# Patient Record
Sex: Female | Born: 1955 | Race: White | Hispanic: No | Marital: Single | Smoking: Former smoker
Health system: Southern US, Community
[De-identification: ages and names within clinical notes are randomized; demographics above are authoritative.]

## PROBLEM LIST (undated history)

## (undated) DIAGNOSIS — E119 Type 2 diabetes mellitus without complications: Secondary | ICD-10-CM

## (undated) DIAGNOSIS — H269 Unspecified cataract: Secondary | ICD-10-CM

## (undated) DIAGNOSIS — J45909 Unspecified asthma, uncomplicated: Secondary | ICD-10-CM

## (undated) DIAGNOSIS — E785 Hyperlipidemia, unspecified: Secondary | ICD-10-CM

## (undated) DIAGNOSIS — T8859XA Other complications of anesthesia, initial encounter: Secondary | ICD-10-CM

## (undated) DIAGNOSIS — R251 Tremor, unspecified: Secondary | ICD-10-CM

## (undated) DIAGNOSIS — Z8719 Personal history of other diseases of the digestive system: Secondary | ICD-10-CM

## (undated) DIAGNOSIS — K439 Ventral hernia without obstruction or gangrene: Secondary | ICD-10-CM

## (undated) DIAGNOSIS — M51369 Other intervertebral disc degeneration, lumbar region without mention of lumbar back pain or lower extremity pain: Secondary | ICD-10-CM

## (undated) DIAGNOSIS — R0600 Dyspnea, unspecified: Secondary | ICD-10-CM

## (undated) DIAGNOSIS — I10 Essential (primary) hypertension: Secondary | ICD-10-CM

## (undated) DIAGNOSIS — I2510 Atherosclerotic heart disease of native coronary artery without angina pectoris: Secondary | ICD-10-CM

## (undated) DIAGNOSIS — M5136 Other intervertebral disc degeneration, lumbar region: Secondary | ICD-10-CM

## (undated) DIAGNOSIS — M1990 Unspecified osteoarthritis, unspecified site: Secondary | ICD-10-CM

## (undated) DIAGNOSIS — R0902 Hypoxemia: Secondary | ICD-10-CM

## (undated) DIAGNOSIS — G709 Myoneural disorder, unspecified: Secondary | ICD-10-CM

## (undated) DIAGNOSIS — J449 Chronic obstructive pulmonary disease, unspecified: Secondary | ICD-10-CM

## (undated) DIAGNOSIS — E039 Hypothyroidism, unspecified: Secondary | ICD-10-CM

## (undated) HISTORY — PX: CHOLECYSTECTOMY: SHX55

## (undated) HISTORY — PX: TONSILLECTOMY: SUR1361

## (undated) HISTORY — PX: OTHER SURGICAL HISTORY: SHX169

## (undated) HISTORY — PX: ABDOMINAL HYSTERECTOMY: SHX81

## (undated) HISTORY — PX: HIATAL HERNIA REPAIR: SHX195

## (undated) HISTORY — DX: Ventral hernia without obstruction or gangrene: K43.9

## (undated) HISTORY — DX: Myoneural disorder, unspecified: G70.9

## (undated) HISTORY — PX: THYROIDECTOMY: SHX17

## (undated) HISTORY — PX: TUMOR REMOVAL: SHX12

---

## 1998-03-11 HISTORY — PX: OTHER SURGICAL HISTORY: SHX169

## 2000-11-27 ENCOUNTER — Emergency Department (HOSPITAL_COMMUNITY): Admission: EM | Admit: 2000-11-27 | Discharge: 2000-11-28 | Payer: Self-pay | Admitting: Emergency Medicine

## 2000-11-28 ENCOUNTER — Encounter: Payer: Self-pay | Admitting: Emergency Medicine

## 2004-10-16 ENCOUNTER — Other Ambulatory Visit: Payer: Self-pay

## 2004-10-16 ENCOUNTER — Emergency Department: Payer: Self-pay | Admitting: Emergency Medicine

## 2004-12-05 ENCOUNTER — Ambulatory Visit: Payer: Self-pay

## 2004-12-28 ENCOUNTER — Other Ambulatory Visit: Payer: Self-pay

## 2004-12-28 ENCOUNTER — Inpatient Hospital Stay: Payer: Self-pay | Admitting: Otolaryngology

## 2006-04-14 ENCOUNTER — Ambulatory Visit: Payer: Self-pay | Admitting: Family Medicine

## 2006-04-15 ENCOUNTER — Ambulatory Visit: Payer: Self-pay | Admitting: Family Medicine

## 2007-05-14 ENCOUNTER — Ambulatory Visit: Payer: Self-pay | Admitting: General Surgery

## 2007-05-25 ENCOUNTER — Other Ambulatory Visit: Payer: Self-pay

## 2007-05-25 ENCOUNTER — Ambulatory Visit: Payer: Self-pay | Admitting: General Surgery

## 2007-06-17 ENCOUNTER — Ambulatory Visit: Payer: Self-pay | Admitting: Cardiology

## 2007-07-06 ENCOUNTER — Ambulatory Visit: Payer: Self-pay | Admitting: General Surgery

## 2011-10-01 ENCOUNTER — Ambulatory Visit: Payer: Self-pay | Admitting: Family Medicine

## 2011-10-14 ENCOUNTER — Ambulatory Visit: Payer: Self-pay | Admitting: Family Medicine

## 2013-11-16 ENCOUNTER — Emergency Department: Payer: Self-pay | Admitting: Internal Medicine

## 2013-11-16 LAB — URINALYSIS, COMPLETE
Bacteria: NONE SEEN
Bilirubin,UR: NEGATIVE
Blood: NEGATIVE
Glucose,UR: 150 mg/dL (ref 0–75)
Ketone: NEGATIVE
Leukocyte Esterase: NEGATIVE
Nitrite: NEGATIVE
Ph: 6 (ref 4.5–8.0)
Protein: NEGATIVE
RBC,UR: 2 /HPF (ref 0–5)
Specific Gravity: 1.014 (ref 1.003–1.030)
Squamous Epithelial: 1
WBC UR: 1 /HPF (ref 0–5)

## 2013-11-16 LAB — COMPREHENSIVE METABOLIC PANEL
Albumin: 3.8 g/dL (ref 3.4–5.0)
Alkaline Phosphatase: 129 U/L — ABNORMAL HIGH
Anion Gap: 9 (ref 7–16)
BUN: 10 mg/dL (ref 7–18)
Bilirubin,Total: 0.4 mg/dL (ref 0.2–1.0)
Calcium, Total: 9.1 mg/dL (ref 8.5–10.1)
Chloride: 97 mmol/L — ABNORMAL LOW (ref 98–107)
Co2: 27 mmol/L (ref 21–32)
Creatinine: 1.04 mg/dL (ref 0.60–1.30)
EGFR (African American): >60
EGFR (Non-African Amer.): 59 — ABNORMAL LOW
Glucose: 262 mg/dL — ABNORMAL HIGH (ref 65–99)
Osmolality: 275 (ref 275–301)
Potassium: 3.5 mmol/L (ref 3.5–5.1)
SGOT(AST): 53 U/L — ABNORMAL HIGH (ref 15–37)
SGPT (ALT): 44 U/L
Sodium: 133 mmol/L — ABNORMAL LOW (ref 136–145)
Total Protein: 7.5 g/dL (ref 6.4–8.2)

## 2013-11-16 LAB — CBC
HCT: 42.2 % (ref 35.0–47.0)
HGB: 13.9 g/dL (ref 12.0–16.0)
MCH: 31.4 pg (ref 26.0–34.0)
MCHC: 32.8 g/dL (ref 32.0–36.0)
MCV: 96 fL (ref 80–100)
Platelet: 337 10*3/uL (ref 150–440)
RBC: 4.42 10*6/uL (ref 3.80–5.20)
RDW: 13.6 % (ref 11.5–14.5)
WBC: 13.6 10*3/uL — ABNORMAL HIGH (ref 3.6–11.0)

## 2013-11-16 LAB — TROPONIN I: Troponin-I: <0.02 ng/mL

## 2013-11-16 LAB — CK TOTAL AND CKMB (NOT AT ARMC)
CK, Total: 35 U/L
CK-MB: 1.6 ng/mL (ref 0.5–3.6)

## 2013-11-16 LAB — MAGNESIUM: Magnesium: 1 mg/dL — ABNORMAL LOW

## 2013-11-16 LAB — HEMOGLOBIN A1C: Hemoglobin A1C: 6.5 % — ABNORMAL HIGH (ref 4.2–6.3)

## 2013-12-02 ENCOUNTER — Ambulatory Visit: Payer: Self-pay | Admitting: Family Medicine

## 2014-06-02 ENCOUNTER — Ambulatory Visit
Admit: 2014-06-02 | Disposition: A | Discharge status: Home / Self Care | Payer: Self-pay | Attending: Oncology | Admitting: Oncology

## 2014-07-13 ENCOUNTER — Other Ambulatory Visit: Payer: Self-pay | Admitting: Family Medicine

## 2014-07-13 DIAGNOSIS — Z1231 Encounter for screening mammogram for malignant neoplasm of breast: Secondary | ICD-10-CM

## 2014-08-02 ENCOUNTER — Ambulatory Visit: Payer: Self-pay | Attending: Family Medicine

## 2014-08-18 ENCOUNTER — Telehealth: Payer: Self-pay

## 2014-08-18 NOTE — Telephone Encounter (Signed)
-----   Message from Otilio Jefferson sent at 08/15/2014  9:56 AM EDT ----- Regarding: colonoscopy From: Tia Masker Sent: 08/04/2014  triage

## 2014-08-18 NOTE — Telephone Encounter (Signed)
LVM for pt to return my call to schedule colonoscopy.  

## 2014-08-19 ENCOUNTER — Other Ambulatory Visit: Payer: Self-pay

## 2014-08-19 ENCOUNTER — Telehealth: Payer: Self-pay

## 2014-08-19 DIAGNOSIS — Z1211 Encounter for screening for malignant neoplasm of colon: Secondary | ICD-10-CM

## 2014-08-19 MED ORDER — NA SULFATE-K SULFATE-MG SULF 17.5-3.13-1.6 GM/177ML PO SOLN: 1.0000 | ORAL | Status: DC

## 2014-08-19 NOTE — Telephone Encounter (Signed)
Pt scheduled for colonoscopy at [facility] on 09-08-14. Instructs/rx mailed.

## 2014-08-19 NOTE — Progress Notes (Signed)
Gastroenterology Pre-Procedure Review  Request Date: 09-08-14  Requesting Physician: Dr.   PATIENT REVIEW QUESTIONS: The patient responded to the following health history questions as indicated:    1. Are you having any GI issues? no 2. Do you have a personal history of Polyps? no 3. Do you have a family history of Colon Cancer or Polyps? no 4. Diabetes Mellitus? yes (Type 2) 5. Joint replacements in the past 12 months?no 6. Major health problems in the past 3 months?yes (Parkinson's ) 7. Any artificial heart valves, MVP, or defibrillator?no    MEDICATIONS & ALLERGIES:    Patient reports the following regarding taking any anticoagulation/antiplatelet therapy:   Plavix, Coumadin, Eliquis, Xarelto, Lovenox, Pradaxa, Brilinta, or Effient? no Aspirin? no  Patient confirms/reports the following medications:  No current outpatient prescriptions on file.   No current facility-administered medications for this visit.    Patient confirms/reports the following allergies:  No Known Allergies  No orders of the defined types were placed in this encounter.    AUTHORIZATION INFORMATION Primary Insurance: 1D#: Group #:  Secondary Insurance: 1D#: Group #:  SCHEDULE INFORMATION: Date:  Time: Location: [place]

## 2014-08-19 NOTE — Telephone Encounter (Signed)
Colonoscopy scheduled at [facility] on 09-08-14. Instructs/rx mailed.

## 2014-09-07 ENCOUNTER — Encounter: Payer: Self-pay | Admitting: *Deleted

## 2014-09-07 ENCOUNTER — Other Ambulatory Visit: Payer: Self-pay

## 2014-09-08 ENCOUNTER — Ambulatory Visit: Payer: Medicare HMO | Admitting: Anesthesiology

## 2014-09-08 ENCOUNTER — Encounter: Admission: RE | Payer: Self-pay | Source: Ambulatory Visit

## 2014-09-08 ENCOUNTER — Ambulatory Visit
Admission: RE | Admit: 2014-09-08 | Discharge: 2014-09-08 | Disposition: A | Discharge status: Home / Self Care | Payer: Medicare HMO | Source: Ambulatory Visit | Attending: Gastroenterology | Admitting: Gastroenterology

## 2014-09-08 ENCOUNTER — Ambulatory Visit: Admission: RE | Admit: 2014-09-08 | Payer: Medicare HMO | Source: Ambulatory Visit | Admitting: Gastroenterology

## 2014-09-08 ENCOUNTER — Encounter
Admission: RE | Disposition: A | Discharge status: Home / Self Care | Payer: Self-pay | Source: Ambulatory Visit | Attending: Gastroenterology

## 2014-09-08 ENCOUNTER — Encounter: Payer: Self-pay | Admitting: Anesthesiology

## 2014-09-08 DIAGNOSIS — M1990 Unspecified osteoarthritis, unspecified site: Secondary | ICD-10-CM | POA: Insufficient documentation

## 2014-09-08 DIAGNOSIS — J449 Chronic obstructive pulmonary disease, unspecified: Secondary | ICD-10-CM | POA: Diagnosis not present

## 2014-09-08 DIAGNOSIS — Z885 Allergy status to narcotic agent status: Secondary | ICD-10-CM | POA: Diagnosis not present

## 2014-09-08 DIAGNOSIS — I10 Essential (primary) hypertension: Secondary | ICD-10-CM | POA: Insufficient documentation

## 2014-09-08 DIAGNOSIS — D125 Benign neoplasm of sigmoid colon: Secondary | ICD-10-CM | POA: Insufficient documentation

## 2014-09-08 DIAGNOSIS — E039 Hypothyroidism, unspecified: Secondary | ICD-10-CM | POA: Insufficient documentation

## 2014-09-08 DIAGNOSIS — E785 Hyperlipidemia, unspecified: Secondary | ICD-10-CM | POA: Insufficient documentation

## 2014-09-08 DIAGNOSIS — E119 Type 2 diabetes mellitus without complications: Secondary | ICD-10-CM | POA: Diagnosis not present

## 2014-09-08 DIAGNOSIS — F17200 Nicotine dependence, unspecified, uncomplicated: Secondary | ICD-10-CM | POA: Insufficient documentation

## 2014-09-08 DIAGNOSIS — D122 Benign neoplasm of ascending colon: Secondary | ICD-10-CM | POA: Diagnosis not present

## 2014-09-08 DIAGNOSIS — Z7951 Long term (current) use of inhaled steroids: Secondary | ICD-10-CM | POA: Diagnosis not present

## 2014-09-08 DIAGNOSIS — Z9049 Acquired absence of other specified parts of digestive tract: Secondary | ICD-10-CM | POA: Diagnosis not present

## 2014-09-08 DIAGNOSIS — M5136 Other intervertebral disc degeneration, lumbar region: Secondary | ICD-10-CM | POA: Diagnosis not present

## 2014-09-08 DIAGNOSIS — Z888 Allergy status to other drugs, medicaments and biological substances status: Secondary | ICD-10-CM | POA: Diagnosis not present

## 2014-09-08 DIAGNOSIS — J45909 Unspecified asthma, uncomplicated: Secondary | ICD-10-CM | POA: Diagnosis not present

## 2014-09-08 DIAGNOSIS — Z90710 Acquired absence of both cervix and uterus: Secondary | ICD-10-CM | POA: Insufficient documentation

## 2014-09-08 DIAGNOSIS — D123 Benign neoplasm of transverse colon: Secondary | ICD-10-CM | POA: Diagnosis not present

## 2014-09-08 DIAGNOSIS — K5792 Diverticulitis of intestine, part unspecified, without perforation or abscess without bleeding: Secondary | ICD-10-CM | POA: Diagnosis present

## 2014-09-08 DIAGNOSIS — K640 First degree hemorrhoids: Secondary | ICD-10-CM | POA: Insufficient documentation

## 2014-09-08 DIAGNOSIS — Z881 Allergy status to other antibiotic agents status: Secondary | ICD-10-CM | POA: Insufficient documentation

## 2014-09-08 DIAGNOSIS — Z79899 Other long term (current) drug therapy: Secondary | ICD-10-CM | POA: Insufficient documentation

## 2014-09-08 HISTORY — DX: Hypothyroidism, unspecified: E03.9

## 2014-09-08 HISTORY — DX: Hyperlipidemia, unspecified: E78.5

## 2014-09-08 HISTORY — DX: Essential (primary) hypertension: I10

## 2014-09-08 HISTORY — DX: Unspecified asthma, uncomplicated: J45.909

## 2014-09-08 HISTORY — DX: Unspecified osteoarthritis, unspecified site: M19.90

## 2014-09-08 HISTORY — DX: Other intervertebral disc degeneration, lumbar region without mention of lumbar back pain or lower extremity pain: M51.369

## 2014-09-08 HISTORY — DX: Type 2 diabetes mellitus without complications: E11.9

## 2014-09-08 HISTORY — PX: COLONOSCOPY WITH PROPOFOL: SHX5780

## 2014-09-08 HISTORY — DX: Chronic obstructive pulmonary disease, unspecified: J44.9

## 2014-09-08 HISTORY — DX: Other intervertebral disc degeneration, lumbar region: M51.36

## 2014-09-08 LAB — GLUCOSE, CAPILLARY: Glucose-Capillary: 243 mg/dL — ABNORMAL HIGH (ref 65–99)

## 2014-09-08 SURGERY — COLONOSCOPY WITH PROPOFOL
Anesthesia: General

## 2014-09-08 MED ORDER — MIDAZOLAM HCL 2 MG/2ML IJ SOLN
INTRAMUSCULAR | Status: DC | PRN
  Administered 2014-09-08: 1 mg via INTRAVENOUS

## 2014-09-08 MED ORDER — PROPOFOL INFUSION 10 MG/ML OPTIME
INTRAVENOUS | Status: DC | PRN
  Administered 2014-09-08: 140 ug/kg/min via INTRAVENOUS

## 2014-09-08 MED ORDER — FENTANYL CITRATE (PF) 100 MCG/2ML IJ SOLN
INTRAMUSCULAR | Status: DC | PRN
  Administered 2014-09-08: 50 ug via INTRAVENOUS

## 2014-09-08 MED ORDER — SODIUM CHLORIDE 0.9 % IV SOLN: INTRAVENOUS | Status: DC

## 2014-09-08 MED ORDER — SODIUM CHLORIDE 0.9 % IV SOLN
INTRAVENOUS | Status: DC
  Administered 2014-09-08: 09:00:00 via INTRAVENOUS

## 2014-09-08 NOTE — Op Note (Signed)
[facility] Gastroenterology Patient Name: Sarah Norton Procedure Date: 09/08/2014 9:35 AM MRN: [id number] Account #: [id number] Date of Birth: Mar 20, 1955 Admit Type: Outpatient Age: 59 Room: [facility] ENDO ROOM 4 Gender: Female Note Status: Finalized Procedure:         Colonoscopy Indications:       Suspected diverticulitis Providers:         Lucilla Lame, MD Referring MD:      Dr Juanda Crumble drew clinic, MD (Referring MD) Medicines:         Propofol per Anesthesia Complications:     No immediate complications. Procedure:         Pre-Anesthesia Assessment:                    - Prior to the procedure, a History and Physical was                     performed, and patient medications and allergies were                     reviewed. The patient's tolerance of previous anesthesia                     was also reviewed. The risks and benefits of the procedure                     and the sedation options and risks were discussed with the                     patient. All questions were answered, and informed consent                     was obtained. Prior Anticoagulants: The patient has taken                     no previous anticoagulant or antiplatelet agents. ASA                     Grade Assessment: II - A patient with mild systemic                     disease. After reviewing the risks and benefits, the                     patient was deemed in satisfactory condition to undergo                     the procedure.                    After obtaining informed consent, the colonoscope was                     passed under direct vision. Throughout the procedure, the                     patient's blood pressure, pulse, and oxygen saturations                     were monitored continuously. The Colonoscope was                     introduced through the anus and advanced to the the cecum,  identified by appendiceal orifice and ileocecal valve. The   colonoscopy was performed without difficulty. The patient                     tolerated the procedure well. The quality of the bowel                     preparation was excellent. Findings:      The perianal and digital rectal examinations were normal.      A 6 mm polyp was found in the sigmoid colon. The polyp was sessile. The       polyp was removed with a cold snare. Resection and retrieval were       complete.      Two sessile polyps were found in the sigmoid colon. The polyps were 2 to       4 mm in size. These polyps were removed with a cold biopsy forceps.       Resection and retrieval were complete.      A 5 mm polyp was found in the ascending colon. The polyp was sessile.       The polyp was removed with a cold snare. Resection and retrieval were       complete.      A 5 mm polyp was found in the transverse colon. The polyp was sessile.       The polyp was removed with a cold snare. Resection and retrieval were       complete.      Non-bleeding internal hemorrhoids were found during retroflexion. The       hemorrhoids were Grade I (internal hemorrhoids that do not prolapse). Impression:        - One 6 mm polyp in the sigmoid colon. Resected and                     retrieved.                    - Two 2 to 4 mm polyps in the sigmoid colon. Resected and                     retrieved.                    - One 5 mm polyp in the ascending colon. Resected and                     retrieved.                    - One 5 mm polyp in the transverse colon. Resected and                     retrieved.                    - Non-bleeding internal hemorrhoids.                    - No diverticulosis seen. Recommendation:    - Await pathology results.                    - Repeat colonoscopy in 5 years if polyp adenoma and 10                     years if hyperplastic Procedure Code(s): --- Professional ---  45385, Colonoscopy, flexible; with removal of tumor(s),                      polyp(s), or other lesion(s) by snare technique                    45380, 59, Colonoscopy, flexible; with biopsy, single or                     multiple Diagnosis Code(s): --- Professional ---                    D12.5, Benign neoplasm of sigmoid colon                    D12.2, Benign neoplasm of ascending colon                    D12.3, Benign neoplasm of transverse colon CPT copyright 2014 American Medical Association. All rights reserved. The codes documented in this report are preliminary and upon coder review may  be revised to meet current compliance requirements. Lucilla Lame, MD 09/08/2014 10:00:07 AM This report has been signed electronically. Number of Addenda: 0 Note Initiated On: 09/08/2014 9:35 AM Scope Withdrawal Time: 0 hours 8 minutes 37 seconds  Total Procedure Duration: 0 hours 18 minutes 22 seconds       [facility]

## 2014-09-08 NOTE — H&P (Signed)
[facility]  [address], [place],  [address] Phone: [phone] Fax : [phone]  Primary Care Physician:  Donnie Coffin, MD Primary Gastroenterologist:  Dr. Allen Norris  Pre-Procedure History & Physical: HPI:  Sarah Norton is a 59 y.o. female is here for an colonoscopy.   Past Medical History  Diagnosis Date  . Hypertension   . Hyperlipidemia   . Diabetes mellitus without complication   . Hypothyroidism   . Arthritis   . Asthma   . COPD (chronic obstructive pulmonary disease)   . DDD (degenerative disc disease), lumbar     Past Surgical History  Procedure Laterality Date  . Thyroidectomy    . Abdominal hysterectomy    . Cholecystectomy    . Tonsillectomy    . Hiatal hernia repair    . Tumor removal Right Leg    Prior to Admission medications   Medication Sig Start Date End Date Taking? Authorizing Provider  albuterol (PROVENTIL HFA;VENTOLIN HFA) 108 (90 BASE) MCG/ACT inhaler Inhale 2 puffs into the lungs every 4 (four) hours as needed for wheezing or shortness of breath.   Yes Historical Provider, MD  amitriptyline (ELAVIL) 50 MG tablet Take 50 mg by mouth at bedtime. 1-2 TABLETS by mouth nightly at bedtime for insomnia   Yes Historical Provider, MD  ARIPiprazole (ABILIFY) 15 MG tablet Take 15 mg by mouth daily.   Yes Historical Provider, MD  benazepril (LOTENSIN) 10 MG tablet Take 10 mg by mouth daily.   Yes Historical Provider, MD  benazepril-hydrochlorthiazide (LOTENSIN HCT) 20-25 MG per tablet Take 1 tablet by mouth daily.   Yes Historical Provider, MD  Carbidopa-Levodopa ER (SINEMET CR) 25-100 MG tablet controlled release Take 2 tablets by mouth 3 (three) times daily.   Yes Historical Provider, MD  cyclobenzaprine (FLEXERIL) 10 MG tablet Take 10 mg by mouth at bedtime as needed for muscle spasms.   Yes Historical Provider, MD  DULoxetine (CYMBALTA) 60 MG capsule Take 60 mg by mouth daily.   Yes Historical Provider, MD  glipiZIDE  (GLUCOTROL) 10 MG tablet Take 10 mg by mouth daily before breakfast.   Yes Historical Provider, MD  indomethacin (INDOCIN) 25 MG capsule Take 25 mg by mouth 3 (three) times daily as needed.   Yes Historical Provider, MD  lamoTRIgine (LAMICTAL) 200 MG tablet Take 200 mg by mouth daily.   Yes Historical Provider, MD  levothyroxine (SYNTHROID, LEVOTHROID) 200 MCG tablet Take 200 mcg by mouth daily before breakfast.   Yes Historical Provider, MD  meloxicam (MOBIC) 7.5 MG tablet Take 7.5 mg by mouth 2 (two) times daily.   Yes Historical Provider, MD  metFORMIN (GLUCOPHAGE-XR) 500 MG 24 hr tablet Take 500 mg by mouth daily with breakfast.   Yes Historical Provider, MD  montelukast (SINGULAIR) 10 MG tablet Take 10 mg by mouth at bedtime.   Yes Historical Provider, MD  Na Sulfate-K Sulfate-Mg Sulf (SUPREP BOWEL PREP) SOLN Take 1 kit by mouth as directed. 08/19/14  Yes Lucilla Lame, MD  ondansetron (ZOFRAN) 4 MG tablet Take 4 mg by mouth every 8 (eight) hours as needed for nausea or vomiting.   Yes Historical Provider, MD  simvastatin (ZOCOR) 20 MG tablet Take 20 mg by mouth daily at 6 PM.   Yes Historical Provider, MD    Allergies as of 09/07/2014 - Review Complete 09/07/2014  Allergen Reaction Noted  . Augmentin [amoxicillin-pot clavulanate]  09/07/2014  . Demerol [meperidine]  09/07/2014  . Mobic [meloxicam]  09/07/2014  History reviewed. No pertinent family history.  History   Social History  . Marital Status: Single    Spouse Name: N/A  . Number of Children: N/A  . Years of Education: N/A   Occupational History  . Not on file.   Social History Main Topics  . Smoking status: Current Every Day Smoker  . Smokeless tobacco: Never Used  . Alcohol Use: No  . Drug Use: No  . Sexual Activity: Not on file   Other Topics Concern  . Not on file   Social History Narrative  . No narrative on file    Review of Systems: See HPI, otherwise negative ROS  Physical Exam: BP 165/78 mmHg   Pulse 91  Temp(Src) 98.5 F (36.9 C) (Tympanic)  Resp 17  Ht _0  (1.727 m)  Wt 255 lb (115.667 kg)  BMI 38.78 kg/m2  SpO2 95% General:   Alert,  pleasant and cooperative in NAD Head:  Normocephalic and atraumatic. Neck:  Supple; no masses or thyromegaly. Lungs:  Clear throughout to auscultation.    Heart:  Regular rate and rhythm. Abdomen:  Soft, nontender and nondistended. Normal bowel sounds, without guarding, and without rebound.   Neurologic:  Alert and  oriented x4;  grossly normal neurologically.  Impression/Plan: Sarah Norton is here for an colonoscopy to be performed for recent diverticulitis   Risks, benefits, limitations, and alternatives regarding  colonoscopy have been reviewed with the patient.  Questions have been answered.  All parties agreeable.   Ollen Bowl, MD  09/08/2014, 9:33 AM

## 2014-09-08 NOTE — Anesthesia Preprocedure Evaluation (Signed)
Anesthesia Evaluation  Patient identified by MRN, date of birth, ID band Patient awake    Reviewed: Allergy & Precautions, NPO status , Patient's Chart, lab work & pertinent test results  Airway Mallampati: III       Dental  (+) Teeth Intact   Pulmonary asthma , COPD COPD inhaler, Current Smoker,    + decreased breath sounds      Cardiovascular hypertension, Pt. on medications Rhythm:Regular Rate:Normal     Neuro/Psych negative neurological ROS  negative psych ROS   GI/Hepatic negative GI ROS, Neg liver ROS,   Endo/Other  diabetes, Well Controlled, Type 2, Oral Hypoglycemic AgentsHypothyroidism Morbid obesity  Renal/GU   negative genitourinary   Musculoskeletal  (+) Arthritis -, Osteoarthritis,    Abdominal Normal abdominal exam  (+)   Peds negative pediatric ROS (+)  Hematology negative hematology ROS (+)   Anesthesia Other Findings   Reproductive/Obstetrics negative OB ROS                             Anesthesia Physical Anesthesia Plan  ASA: III  Anesthesia Plan: General   Post-op Pain Management:    Induction: Intravenous  Airway Management Planned: Nasal Cannula  Additional Equipment:   Intra-op Plan:   Post-operative Plan:   Informed Consent: I have reviewed the patients History and Physical, chart, labs and discussed the procedure including the risks, benefits and alternatives for the proposed anesthesia with the patient or authorized representative who has indicated his/her understanding and acceptance.     Plan Discussed with: CRNA  Anesthesia Plan Comments:         Anesthesia Quick Evaluation

## 2014-09-08 NOTE — Transfer of Care (Signed)
Immediate Anesthesia Transfer of Care Note  Patient: Sarah Norton  Procedure(s) Performed: Procedure(s): COLONOSCOPY WITH PROPOFOL (N/A)  Patient Location: PACU  Anesthesia Type:General  Level of Consciousness: awake, alert  and oriented  Airway & Oxygen Therapy: Patient Spontanous Breathing and Patient connected to nasal cannula oxygen  Post-op Assessment: Report given to RN and Post -op Vital signs reviewed and stable  Post vital signs: Reviewed  Last Vitals:  Filed Vitals:   09/08/14 0835  BP: 165/78  Pulse: 91  Temp: 36.9 C  Resp: 17    Complications: No apparent anesthesia complications

## 2014-09-08 NOTE — Anesthesia Postprocedure Evaluation (Signed)
  Anesthesia Post-op Note  Patient: Sarah Norton  Procedure(s) Performed: Procedure(s): COLONOSCOPY WITH PROPOFOL (N/A)  Anesthesia type:General  Patient location: PACU  Post pain: Pain level controlled  Post assessment: Post-op Vital signs reviewed, Patient's Cardiovascular Status Stable, Respiratory Function Stable, Patent Airway and No signs of Nausea or vomiting  Post vital signs: Reviewed and stable  Last Vitals:  Filed Vitals:   09/08/14 0835  BP: 165/78  Pulse: 91  Temp: 36.9 C  Resp: 17    Level of consciousness: awake, alert  and patient cooperative  Complications: No apparent anesthesia complications

## 2014-09-09 ENCOUNTER — Encounter: Payer: Self-pay | Admitting: Gastroenterology

## 2014-09-09 LAB — SURGICAL PATHOLOGY

## 2014-12-22 DIAGNOSIS — G20 Parkinson's disease: Secondary | ICD-10-CM | POA: Insufficient documentation

## 2014-12-22 DIAGNOSIS — R29898 Other symptoms and signs involving the musculoskeletal system: Secondary | ICD-10-CM | POA: Insufficient documentation

## 2016-03-29 ENCOUNTER — Other Ambulatory Visit: Payer: Self-pay | Admitting: Family Medicine

## 2016-04-02 ENCOUNTER — Ambulatory Visit
Admission: RE | Admit: 2016-04-02 | Discharge: 2016-04-02 | Disposition: A | Discharge status: Home / Self Care | Payer: Medicare HMO | Source: Ambulatory Visit | Attending: Family Medicine | Admitting: Family Medicine

## 2016-04-02 ENCOUNTER — Other Ambulatory Visit: Payer: Self-pay | Admitting: Family Medicine

## 2016-04-02 DIAGNOSIS — R042 Hemoptysis: Secondary | ICD-10-CM | POA: Diagnosis not present

## 2016-04-02 DIAGNOSIS — J449 Chronic obstructive pulmonary disease, unspecified: Secondary | ICD-10-CM | POA: Insufficient documentation

## 2016-04-11 ENCOUNTER — Other Ambulatory Visit: Payer: Self-pay | Admitting: Family Medicine

## 2016-04-11 DIAGNOSIS — Z1231 Encounter for screening mammogram for malignant neoplasm of breast: Secondary | ICD-10-CM

## 2016-05-22 ENCOUNTER — Ambulatory Visit
Admission: RE | Admit: 2016-05-22 | Discharge: 2016-05-22 | Disposition: A | Discharge status: Home / Self Care | Payer: Medicare HMO | Source: Ambulatory Visit | Attending: Family Medicine | Admitting: Family Medicine

## 2016-05-22 DIAGNOSIS — Z1231 Encounter for screening mammogram for malignant neoplasm of breast: Secondary | ICD-10-CM | POA: Diagnosis not present

## 2016-08-29 ENCOUNTER — Encounter (INDEPENDENT_AMBULATORY_CARE_PROVIDER_SITE_OTHER): Payer: Self-pay

## 2016-08-29 ENCOUNTER — Encounter: Payer: Self-pay | Admitting: Oncology

## 2016-08-29 ENCOUNTER — Inpatient Hospital Stay: Payer: Medicare HMO

## 2016-08-29 ENCOUNTER — Inpatient Hospital Stay: Payer: Medicare HMO | Attending: Oncology | Admitting: Oncology

## 2016-08-29 VITALS — BP 139/78 | HR 86 | Temp 98.1°F | Resp 18 | Ht 68.9 in | Wt 267.7 lb

## 2016-08-29 DIAGNOSIS — F17210 Nicotine dependence, cigarettes, uncomplicated: Secondary | ICD-10-CM | POA: Diagnosis not present

## 2016-08-29 DIAGNOSIS — Z7984 Long term (current) use of oral hypoglycemic drugs: Secondary | ICD-10-CM | POA: Diagnosis not present

## 2016-08-29 DIAGNOSIS — I10 Essential (primary) hypertension: Secondary | ICD-10-CM | POA: Insufficient documentation

## 2016-08-29 DIAGNOSIS — D72820 Lymphocytosis (symptomatic): Secondary | ICD-10-CM

## 2016-08-29 DIAGNOSIS — M5136 Other intervertebral disc degeneration, lumbar region: Secondary | ICD-10-CM | POA: Insufficient documentation

## 2016-08-29 DIAGNOSIS — R899 Unspecified abnormal finding in specimens from other organs, systems and tissues: Secondary | ICD-10-CM

## 2016-08-29 DIAGNOSIS — G20 Parkinson's disease: Secondary | ICD-10-CM | POA: Insufficient documentation

## 2016-08-29 DIAGNOSIS — E039 Hypothyroidism, unspecified: Secondary | ICD-10-CM | POA: Insufficient documentation

## 2016-08-29 DIAGNOSIS — E785 Hyperlipidemia, unspecified: Secondary | ICD-10-CM | POA: Insufficient documentation

## 2016-08-29 DIAGNOSIS — D72829 Elevated white blood cell count, unspecified: Secondary | ICD-10-CM | POA: Diagnosis not present

## 2016-08-29 DIAGNOSIS — E119 Type 2 diabetes mellitus without complications: Secondary | ICD-10-CM | POA: Insufficient documentation

## 2016-08-29 DIAGNOSIS — J449 Chronic obstructive pulmonary disease, unspecified: Secondary | ICD-10-CM | POA: Insufficient documentation

## 2016-08-29 DIAGNOSIS — J45909 Unspecified asthma, uncomplicated: Secondary | ICD-10-CM | POA: Diagnosis not present

## 2016-08-29 DIAGNOSIS — Z79899 Other long term (current) drug therapy: Secondary | ICD-10-CM | POA: Insufficient documentation

## 2016-08-29 LAB — COMPREHENSIVE METABOLIC PANEL
ALK PHOS: 116 U/L (ref 38–126)
ALT: 25 U/L (ref 14–54)
AST: 22 U/L (ref 15–41)
Albumin: 4.7 g/dL (ref 3.5–5.0)
Anion gap: 11 (ref 5–15)
BUN: 18 mg/dL (ref 6–20)
CALCIUM: 9.6 mg/dL (ref 8.9–10.3)
CO2: 27 mmol/L (ref 22–32)
CREATININE: 0.58 mg/dL (ref 0.44–1.00)
Chloride: 100 mmol/L — ABNORMAL LOW (ref 101–111)
Glucose, Bld: 147 mg/dL — ABNORMAL HIGH (ref 65–99)
Potassium: 4.2 mmol/L (ref 3.5–5.1)
Sodium: 138 mmol/L (ref 135–145)
Total Bilirubin: 0.5 mg/dL (ref 0.3–1.2)
Total Protein: 7.7 g/dL (ref 6.5–8.1)

## 2016-08-29 LAB — CBC WITH DIFFERENTIAL/PLATELET
Basophils Absolute: 0.1 10*3/uL (ref 0–0.1)
Basophils Relative: 1 %
Eosinophils Absolute: 0.1 10*3/uL (ref 0–0.7)
Eosinophils Relative: 1 %
HCT: 43.1 % (ref 35.0–47.0)
HEMOGLOBIN: 15 g/dL (ref 12.0–16.0)
LYMPHS ABS: 4.5 10*3/uL — AB (ref 1.0–3.6)
LYMPHS PCT: 37 %
MCH: 31.7 pg (ref 26.0–34.0)
MCHC: 34.9 g/dL (ref 32.0–36.0)
MCV: 90.8 fL (ref 80.0–100.0)
Monocytes Absolute: 0.7 10*3/uL (ref 0.2–0.9)
Monocytes Relative: 6 %
NEUTROS PCT: 55 %
Neutro Abs: 6.6 10*3/uL — ABNORMAL HIGH (ref 1.4–6.5)
Platelets: 303 10*3/uL (ref 150–440)
RBC: 4.74 MIL/uL (ref 3.80–5.20)
RDW: 13.2 % (ref 11.5–14.5)
WBC: 12 10*3/uL — AB (ref 3.6–11.0)

## 2016-08-29 LAB — PATHOLOGIST SMEAR REVIEW

## 2016-08-29 NOTE — Progress Notes (Signed)
Here for initial evaluation. Denies being suicidal . Feels stressed w multiple medical DX  Refd SW consultation /therpy refd/

## 2016-08-29 NOTE — Progress Notes (Signed)
Hematology/Oncology Consult note [facility] [phone] Fax:([phone]  Patient Care Team: Donnie Coffin, MD as PCP - General (Family Medicine)   Name of the patient: Sarah Norton  [id number]  09-16-1955    Reason for referral- leucocytosis   Referring physician- Dr.Aycock  Date of visit: 08/29/16   History of presenting illness- patient is a 61 year old female who was been referred to [place] for leukocytosis. Her WBC count has been between 12-16 since January 2018. Her past medical history is significant for hypertension hyperlipidemia, hypothyroidism and type 2 diabetes among other medical problems. I do not have the results of CBC that were done at an care provider's office.  Patient's CBC was noted to have a white count of 13.6 in September 2015. In February 2016 her white count was elevated at 11.9, H&H 14.3/40 1. the platelet count of 297. Differential showed lymphocytosis with absolute lymphocyte count of 4.2. CBC from 07/27/2016 showed white count of 16, H&H of 15.1/44.5 and a platelet count of 327. Differential mainly showed neutrophilia and lymphocytosis. In February 2018 her white count was 12.4 and January 2018 her white count was 15.9 again with neutrophilia and lymphocytosis. Her past medical history significant for hypertension hyperlipidemia type 2 diabetes osteoarthritis and hypothyroidism other medical problems. Patient also has ongoing neuorological issues of atypical parkinsonism. She had tremors in her right hand andtingling numbness in her fingers for which she sees Dr. Manuella Ghazi. CT an dMRI brain did not reveal any acute pathology. Reports trouble with her balance. Dr. Manuella Ghazi had ordered paraneoplastic panel and one of them came back as abnormal- indeterminate significance  ECOG PS- 1  Pain scale- 6   Review of systems- Review of Systems  Constitutional: Positive for malaise/fatigue. Negative for chills, fever and weight loss.  HENT:  Negative for congestion, ear discharge and nosebleeds.   Eyes: Negative for blurred vision.  Respiratory: Negative for cough, hemoptysis, sputum production, shortness of breath and wheezing.   Cardiovascular: Negative for chest pain, palpitations, orthopnea and claudication.  Gastrointestinal: Negative for abdominal pain, blood in stool, constipation, diarrhea, heartburn, melena, nausea and vomiting.  Genitourinary: Negative for dysuria, flank pain, frequency, hematuria and urgency.  Musculoskeletal: Negative for back pain, joint pain and myalgias.  Skin: Negative for rash.  Neurological: Negative for dizziness, tingling, focal weakness, seizures, weakness and headaches.  Endo/Heme/Allergies: Does not bruise/bleed easily.  Psychiatric/Behavioral: Negative for depression and suicidal ideas. The patient does not have insomnia.     No Known Allergies  Patient Active Problem List   Diagnosis Date Noted  . 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 Bilateral leg weakness 12/22/2014     Past Medical History:  Diagnosis Date  . Arthritis   . Asthma   . COPD (chronic obstructive pulmonary disease) ([place])   . DDD (degenerative disc disease), lumbar   . Diabetes mellitus without complication ([place])   . Hyperlipidemia   . Hypertension   . Hypothyroidism      Past Surgical History:  Procedure Laterality Date  . ABDOMINAL HYSTERECTOMY    . CHOLECYSTECTOMY    . COLONOSCOPY WITH PROPOFOL N/A 09/08/2014   Procedure: COLONOSCOPY WITH PROPOFOL;  Surgeon: Lucilla Lame, MD;  Location: [facility] ENDOSCOPY;  Service: Endoscopy;  Laterality: N/A;  . HIATAL HERNIA REPAIR    . parathyroidectomy  2000  . THYROIDECTOMY    . TONSILLECTOMY    . TUMOR REMOVAL Right Leg    Social History   Social History  . Marital status: Single  Spouse name: N/A  . Number of children: N/A  . Years of education: N/A   Occupational History  . Not on file.   Social History Main Topics  . Smoking status: Current  Every Day Smoker    Packs/day: 0.50    Years: 48.00    Types: Cigarettes  . Smokeless tobacco: Never Used  . Alcohol use No  . Drug use: No  . Sexual activity: No   Other Topics Concern  . Not on file   Social History Narrative  . No narrative on file     Family History  Problem Relation Age of Onset  . Breast cancer Sister   . Kidney disease Mother      Current Outpatient Prescriptions:  .  albuterol (PROVENTIL HFA;VENTOLIN HFA) 108 (90 BASE) MCG/ACT inhaler, Inhale 2 puffs into the lungs every 4 (four) hours as needed for wheezing or shortness of breath., Disp: , Rfl:  .  ascorbic acid (VITAMIN C) 500 MG tablet, Take 500 mg by mouth daily., Disp: , Rfl:  .  atorvastatin (LIPITOR) 40 MG tablet, , Disp: , Rfl:  .  benazepril (LOTENSIN) 10 MG tablet, Take 10 mg by mouth daily. , Disp: , Rfl:  .  benazepril-hydrochlorthiazide (LOTENSIN HCT) 20-25 MG per tablet, Take 1 tablet by mouth daily., Disp: , Rfl:  .  cetirizine (ZYRTEC) 10 MG tablet, , Disp: , Rfl:  .  Cholecalciferol (VITAMIN D-1000 MAX ST) 1000 units tablet, Take by mouth. , Disp: , Rfl:  .  diclofenac (VOLTAREN) 75 MG EC tablet, , Disp: , Rfl:  .  Flaxseed, Linseed, (FLAXSEED OIL) 1200 MG CAPS, Take by mouth., Disp: , Rfl:  .  fluticasone (FLONASE) 50 MCG/ACT nasal spray, Place 2 sprays into both nostrils daily., Disp: , Rfl:  .  gabapentin (NEURONTIN) 300 MG capsule, 300 mg 3 (three) times daily. , Disp: , Rfl:  .  glipiZIDE (GLUCOTROL XL) 10 MG 24 hr tablet, , Disp: , Rfl:  .  levothyroxine (SYNTHROID, LEVOTHROID) 200 MCG tablet, Take 200 mcg by mouth daily before breakfast., Disp: , Rfl:  .  metFORMIN (GLUMETZA) 1000 MG (MOD) 24 hr tablet, Take 1,000 mg by mouth 2 (two) times daily with a meal., Disp: , Rfl:  .  milk thistle 175 MG tablet, Take 175 mg by mouth daily., Disp: , Rfl:  .  montelukast (SINGULAIR) 10 MG tablet, Take 10 mg by mouth at bedtime., Disp: , Rfl:  .  ondansetron (ZOFRAN) 4 MG tablet, Take 4 mg  by mouth every 8 (eight) hours as needed for nausea or vomiting., Disp: , Rfl:  .  vitamin E 600 UNIT capsule, Take 600 Units by mouth daily., Disp: , Rfl:  .  LOTEMAX 0.5 % GEL, INT 1 DROP IN OU BID, Disp: , Rfl: 2 .  nystatin (MYCOSTATIN/NYSTOP) powder, , Disp: , Rfl:  .  promethazine (PHENERGAN) 25 MG tablet, , Disp: , Rfl:    Physical exam:  Vitals:   08/29/16 1426  BP: 139/78  Pulse: 86  Resp: 18  Temp: 98.1 F (36.7 C)  TempSrc: Tympanic  Weight: 267 lb 11.2 oz (121.4 kg)  Height: 5' 8.9" (1.75 m)   Physical Exam  Constitutional: She is oriented to person, place, and time and well-developed, well-nourished, and in no distress.  She is sitting in a wheelchair  HENT:  Head: Normocephalic and atraumatic.  Eyes: EOM are normal. Pupils are equal, round, and reactive to light.  Neck: Normal range of motion.  Cardiovascular: Normal rate, regular rhythm and normal heart sounds.   Pulmonary/Chest: Effort normal and breath sounds normal.  Abdominal: Soft. Bowel sounds are normal.  Neurological: She is alert and oriented to person, place, and time.  Skin: Skin is warm and dry.       CMP Latest Ref Rng & Units 11/16/2013  Glucose 65 - 99 mg/dL 262(H)  BUN 7 - 18 mg/dL 10  Creatinine 0.60 - 1.30 mg/dL 1.04  Sodium 136 - 145 mmol/L 133(L)  Potassium 3.5 - 5.1 mmol/L 3.5  Chloride 98 - 107 mmol/L 97(L)  CO2 21 - 32 mmol/L 27  Calcium 8.5 - 10.1 mg/dL 9.1  Total Protein 6.4 - 8.2 g/dL 7.5  Total Bilirubin 0.2 - 1.0 mg/dL 0.4  Alkaline Phos Unit/L 129(H)  AST 15 - 37 Unit/L 53(H)  ALT U/L 44   CBC Latest Ref Rng & Units 11/16/2013  WBC 3.6 - 11.0 x10 3/mm 3 13.6(H)  Hemoglobin 12.0 - 16.0 g/dL 13.9  Hematocrit 35.0 - 47.0 % 42.2  Platelets 150 - 440 x10 3/mm 3 337   Assessment and plan- Patient is a 61 y.o. female referred to [place] for leucocytosis/ lymphocytosis  I do not have her recent cbc from Dr. Valinda Party office today. Looking at past counts, she has had mild  leucocytosis mainly lymphocytossi. This could be reactive or clonal. Clonal process is seen in conditions like CLL. Today I will check cbc with diff, pathology review of smear, cmp and peripheral flow cytometry. I will see her back in 2 weeks time to discuss results and further management. Explained to her what CLL is and indications of treatment such as cytopenias, bulky adenopathy and B symptoms which she does not have  Given that she has ongoing neurological symptoms and given her abnormal paraneoplastic panel and long standing history of smoking, I will obtain CT chest abdomen and pelvis with contrast to r/o maliganncy.  rtc in 2 weeks scans prior  > 30 min spent in reviewing outside records from pcp and neurology   Thank you for this kind referral and the opportunity to participate in the care of this patient   Visit Diagnosis 1. Lymphocytosis     Dr. Randa Evens, MD, MPH [place] [facility] Pager- [phone] 08/29/2016

## 2016-09-02 LAB — COMP PANEL: LEUKEMIA/LYMPHOMA

## 2016-09-05 ENCOUNTER — Encounter: Payer: Self-pay | Admitting: Oncology

## 2016-09-06 ENCOUNTER — Ambulatory Visit
Admission: RE | Admit: 2016-09-06 | Discharge: 2016-09-06 | Disposition: A | Discharge status: Home / Self Care | Payer: Medicare HMO | Source: Ambulatory Visit | Attending: Oncology | Admitting: Oncology

## 2016-09-06 DIAGNOSIS — R6889 Other general symptoms and signs: Secondary | ICD-10-CM | POA: Insufficient documentation

## 2016-09-06 DIAGNOSIS — R918 Other nonspecific abnormal finding of lung field: Secondary | ICD-10-CM | POA: Insufficient documentation

## 2016-09-06 DIAGNOSIS — I2510 Atherosclerotic heart disease of native coronary artery without angina pectoris: Secondary | ICD-10-CM | POA: Diagnosis not present

## 2016-09-06 DIAGNOSIS — D72820 Lymphocytosis (symptomatic): Secondary | ICD-10-CM

## 2016-09-06 DIAGNOSIS — R899 Unspecified abnormal finding in specimens from other organs, systems and tissues: Secondary | ICD-10-CM

## 2016-09-06 MED ORDER — IOPAMIDOL (ISOVUE-300) INJECTION 61%
100.0000 mL | Freq: Once | INTRAVENOUS | Status: AC | PRN
  Administered 2016-09-06: 100 mL via INTRAVENOUS

## 2016-09-10 ENCOUNTER — Telehealth: Payer: Self-pay | Admitting: *Deleted

## 2016-09-10 NOTE — Telephone Encounter (Signed)
Patient called back and results were given to her

## 2016-09-10 NOTE — Telephone Encounter (Signed)
Patietn called wanting her results and she stated she cancelled her appt to see Dr Janese Banks and was not rescheduling it. I explained to her that I had gotten a message earlier and that Dr Janese Banks was going to call her back. She stated she does not want to speak with her or come back to see her. She then asked how she can get her results. I explained that I cannot give er results until I speak with Dr Janese Banks to see what they mean in relationship to her diagnosis. She got upset and hung up on me.  I did discuss results with Dr Janese Banks who said that her WBC remains elevated, but nothing showed that it is CLL or Lymphoma and that her CT was not showing any signs of cancer either. She can fu with her PCP regarding her WBC elevation and if needed, she can be referred back to our clinic and see another provider. I attempted to return call to patient but VM and asjked that she call me back to discuss results now that I have spoken to Dr Janese Banks

## 2016-09-12 ENCOUNTER — Inpatient Hospital Stay: Payer: Medicare HMO | Admitting: Oncology

## 2016-10-01 ENCOUNTER — Telehealth: Payer: Self-pay | Admitting: *Deleted

## 2016-10-01 NOTE — Telephone Encounter (Signed)
After Dr Janese Banks spoke with Dr Clide Deutscher, it was decided not to retest for flow. Patient advised of this and was fine with that decision. She was offered to come in to see Dr Janese Banks to discuss results, but she said since it was negative there is no need to come in for it. I offered her to call back at any time if she changes her mind and she thanked me for that

## 2016-10-01 NOTE — Telephone Encounter (Signed)
PC placed to Dr Clide Deutscher, she is to return call to Dr Janese Banks

## 2016-10-01 NOTE — Telephone Encounter (Signed)
We already did flow cytometry which did not show any lymphoma or leukemia. You can tell her the report. I will be happy to see her back and discuss her bloodwork and scans with her

## 2016-10-01 NOTE — Telephone Encounter (Signed)
Patient called and states that ehr PCP Dr Clide Deutscher wants [place] to draw a flow cytometry on her and to schedule a FU appt to see her. She states she cancelled her last appt on 7/5, but would like to reschedule it now. Please advise as this is the patient that did not want to come back or to see Dr Janese Banks.

## 2017-12-16 DIAGNOSIS — M7700 Medial epicondylitis, unspecified elbow: Secondary | ICD-10-CM | POA: Insufficient documentation

## 2017-12-24 DIAGNOSIS — R2230 Localized swelling, mass and lump, unspecified upper limb: Secondary | ICD-10-CM | POA: Insufficient documentation

## 2018-04-16 IMAGING — CT CT ABDOMEN W/ CM
3 of 4 series · 11 of 36 positions shown, 17 images · IV contrast (iopamidol)
Comparison: None.

CLINICAL DATA: Elevated white blood cell count.  Extreme fatigue.

EXAM:
CT CHEST, ABDOMEN, AND PELVIS WITH CONTRAST
TECHNIQUE: Multidetector CT imaging of the chest, abdomen and pelvis was
performed following the standard protocol during bolus
administration of intravenous contrast.
CONTRAST:  100mL [id number] IOPAMIDOL ([id number]) INJECTION 61%

[Series 2: cap with · axial · 0.95mm/px · z∈[-654,-304]mm · 7 of 94 slices shown, 12 images]
[im 12/94  soft-tissue]
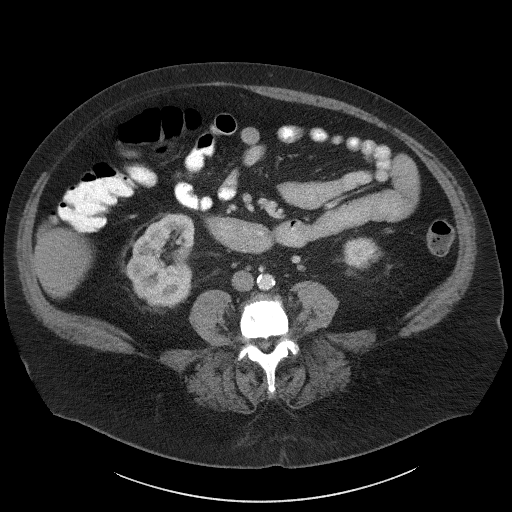
[im 12/94  bone]
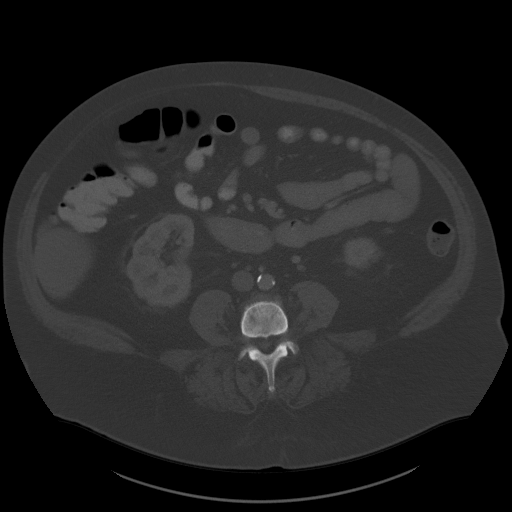
[im 24/94  soft-tissue]
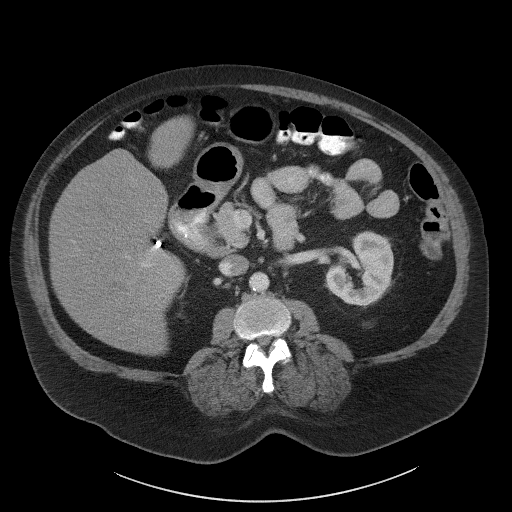
[im 35/94  soft-tissue]
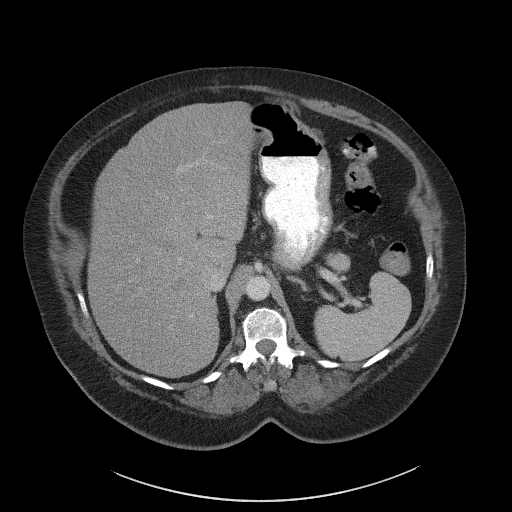
[im 47/94  soft-tissue]
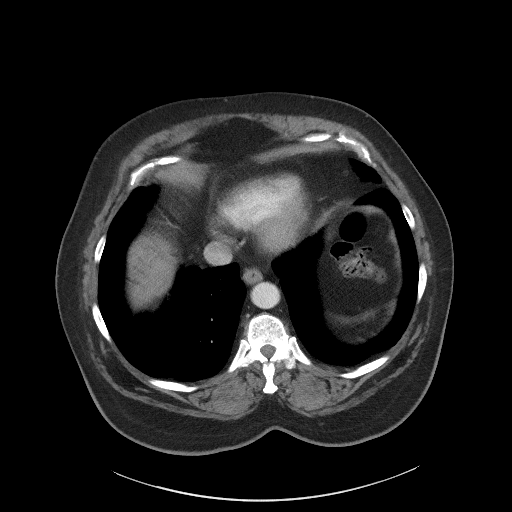
[im 47/94  lung]
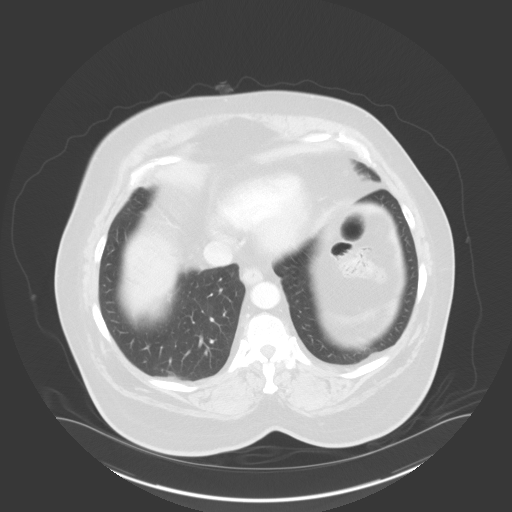
[im 59/94  soft-tissue]
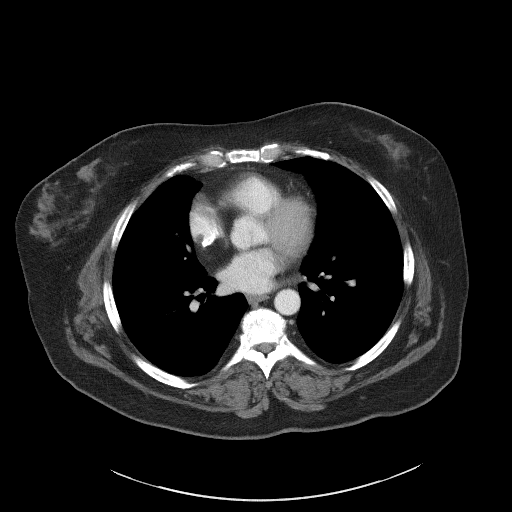
[im 59/94  lung]
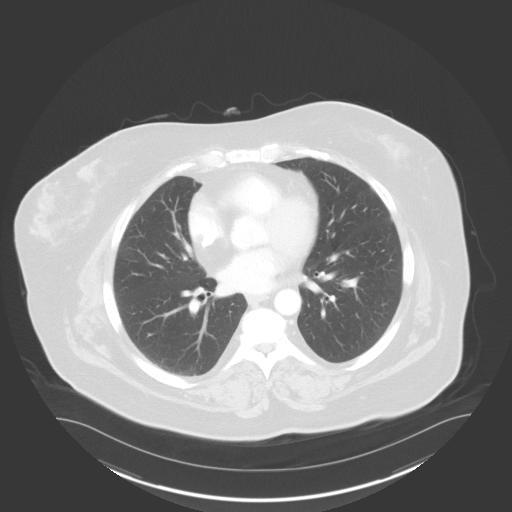
[im 70/94  soft-tissue]
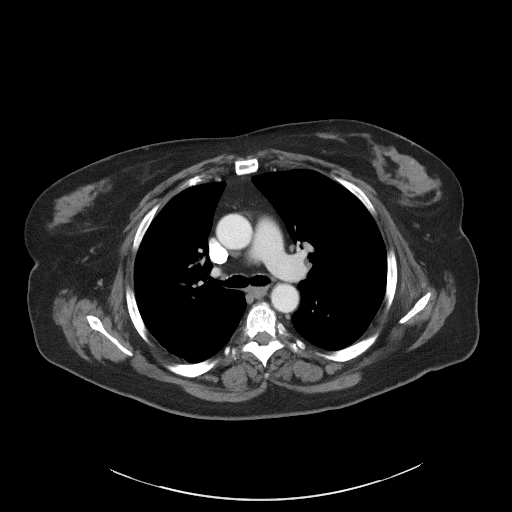
[im 70/94  lung]
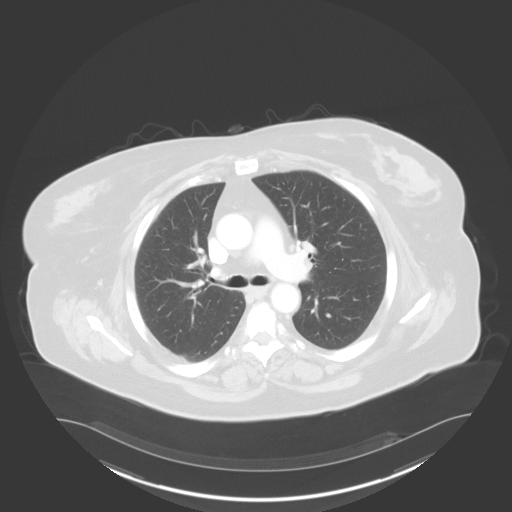
[im 82/94  soft-tissue]
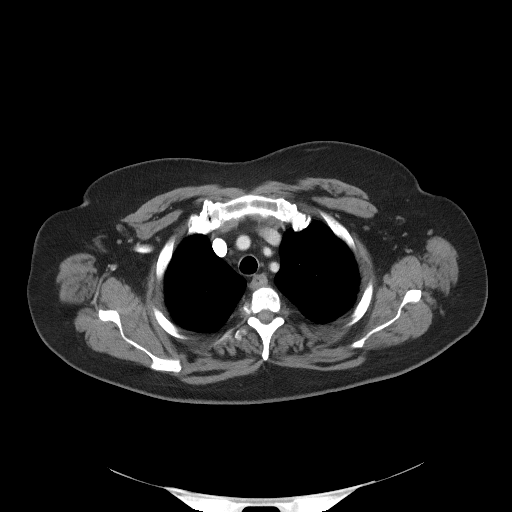
[im 82/94  lung]
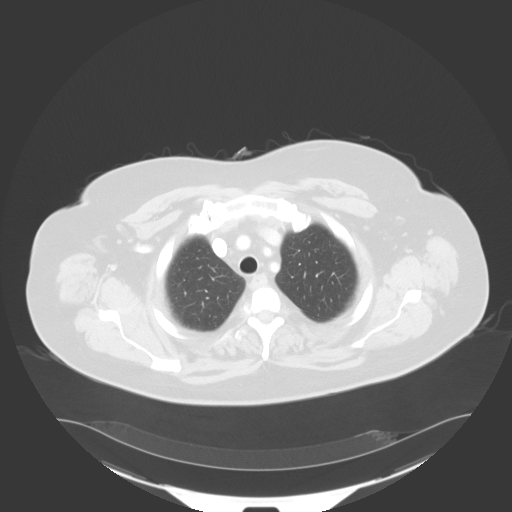

[Series 6: sagittals · sagittal · 0.79mm/px · 1 of 234 slices shown, 2 images]
[im 78/234  soft-tissue]
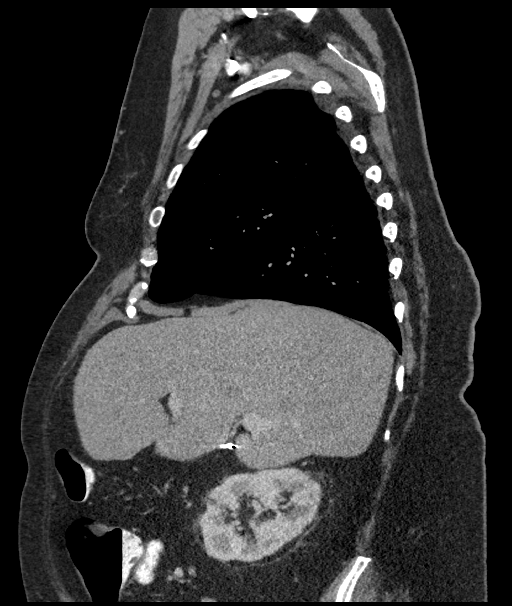
[im 78/234  bone]
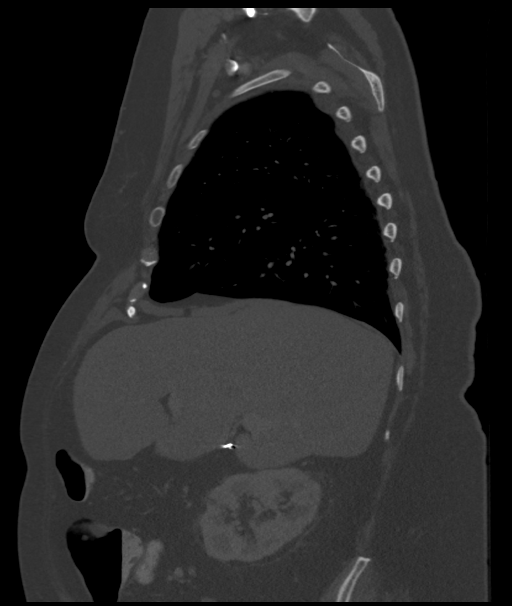

[Series 7: 3-4 mins · axial · 0.95mm/px · z∈[-656,-550]mm · 3 of 43 slices shown]
[im 11/43  soft-tissue]
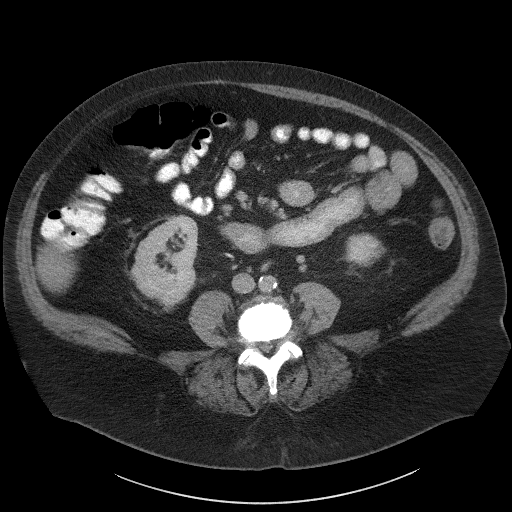
[im 22/43  soft-tissue]
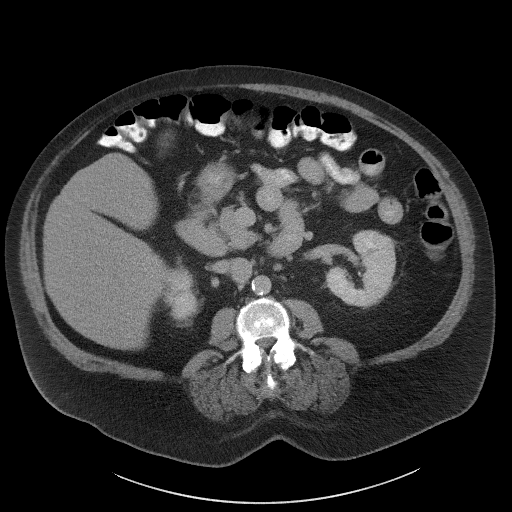
[im 32/43  soft-tissue]
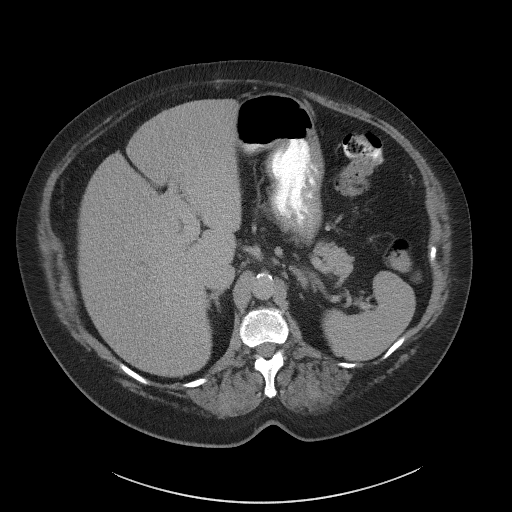

[11 of 36 positions shown; findings below may reference images not displayed]

FINDINGS: CT CHEST FINDINGS

Cardiovascular: Coronary artery calcification and aortic
atherosclerotic calcification. Atrial septal lipoma in the
mediastinum (image 38, series 2).

Mediastinum/Nodes: No axillary supraclavicular adenopathy. No
mediastinal hilar adenopathy. No pericardial fluid. Esophagus normal

Lungs/Pleura:

4 mm RIGHT upper lobe pulmonary nodule.

4 mm RIGHT lower lobe nodule along the fissure (image 93, series 4).

Pleural thickening and atelectasis along the inferior aspect the
LEFT oblique fissure within the lingular lobe.

No evidence of active pulmonary infection.

Musculoskeletal: No aggressive osseous lesion.

CT ABDOMEN AND PELVIS FINDINGS

Hepatobiliary: No focal hepatic lesion. Postcholecystectomy. No duct
dilatation.

Pancreas: Normal pancreatic parenchymal intensity. No ductal
dilatation or inflammation.

Spleen: Normal spleen.

Adrenals/urinary tract: Adrenal glands and kidneys are normal.

Stomach/Bowel: Stomach and limited of the small bowel is
unremarkable

Vascular/Lymphatic: Abdominal aortic normal caliber. No
retroperitoneal periportal lymphadenopathy.

Musculoskeletal: Degenerate change of the lower lumbar spine with
endplate sclerosis osteophytosis and facet hypertrophy.
IMPRESSION: 1. No evidence of pulmonary infection. No explanation for elevated
white blood cell count.
2. Normal liver, biliary tree and pancreas.
3. Several small benign-appearing pulmonary nodules. These nodules
are unchanged from CT scan from 10/16/2004 and consistent with
benign etiology.
4. Coronary artery calcification and aortic atherosclerotic
calcification.

## 2018-12-29 ENCOUNTER — Ambulatory Visit (INDEPENDENT_AMBULATORY_CARE_PROVIDER_SITE_OTHER): Payer: Medicare Other | Admitting: Vascular Surgery

## 2018-12-29 ENCOUNTER — Encounter (INDEPENDENT_AMBULATORY_CARE_PROVIDER_SITE_OTHER): Payer: Self-pay | Admitting: Vascular Surgery

## 2018-12-29 ENCOUNTER — Encounter (INDEPENDENT_AMBULATORY_CARE_PROVIDER_SITE_OTHER): Payer: Self-pay

## 2018-12-29 ENCOUNTER — Other Ambulatory Visit: Payer: Self-pay

## 2018-12-29 VITALS — BP 168/79 | HR 83 | Resp 16 | Ht 68.0 in | Wt 270.0 lb

## 2018-12-29 DIAGNOSIS — I10 Essential (primary) hypertension: Secondary | ICD-10-CM

## 2018-12-29 DIAGNOSIS — R251 Tremor, unspecified: Secondary | ICD-10-CM | POA: Insufficient documentation

## 2018-12-29 DIAGNOSIS — M7989 Other specified soft tissue disorders: Secondary | ICD-10-CM | POA: Insufficient documentation

## 2018-12-29 DIAGNOSIS — L03116 Cellulitis of left lower limb: Secondary | ICD-10-CM | POA: Diagnosis not present

## 2018-12-29 DIAGNOSIS — E079 Disorder of thyroid, unspecified: Secondary | ICD-10-CM | POA: Insufficient documentation

## 2018-12-29 DIAGNOSIS — M1990 Unspecified osteoarthritis, unspecified site: Secondary | ICD-10-CM | POA: Insufficient documentation

## 2018-12-29 DIAGNOSIS — E119 Type 2 diabetes mellitus without complications: Secondary | ICD-10-CM

## 2018-12-29 NOTE — Assessment & Plan Note (Signed)
The patient has some erythema in both legs that is likely a combination of inflammation and stasis changes.  With the tenderness to the touch and the pain, there is likely some degree of cellulitis.  We are going to be wrapping this and Unna boot for now and see how she does.  If she does not improve antibiotics per mouth may be necessary.

## 2018-12-29 NOTE — Assessment & Plan Note (Signed)
blood glucose control important in reducing the progression of atherosclerotic disease. Also, involved in wound healing. On appropriate medications.  

## 2018-12-29 NOTE — Assessment & Plan Note (Signed)
Patient has pretty severe swelling bilaterally and clearly has a component of lymphedema from chronic scarring of the lymphatic channels.  Her swelling is really too severe to get her in compression stockings today so we are going to start with Unna boots as she has impending skin breakdown on the left leg and severe swelling bilaterally.  I think this will be our best way to start and plan to do this for 3 to 4 weeks before we get a reflux study that may be useful.  This would also tell us of any thrombotic issues in the venous system.  We can go ahead and get started on getting a lymphedema pump because she clearly has at least stage II if not stage III lymphedema.  She has pretty significant fibrosis of the skin already.  The patient should elevate her legs as much as possible and increase her activity as much as possible.  We will do weekly Unna boot changes and I will see her back with her duplex in several weeks

## 2018-12-29 NOTE — Patient Instructions (Signed)

## 2018-12-29 NOTE — Progress Notes (Signed)
Patient ID: Sarah Norton, female   DOB: Jan 04, 1956, 63 y.o.   MRN: [id number]  Chief Complaint  Patient presents with  . New Patient (Initial Visit)    HPI Sarah Norton is a 63 y.o. female.  I am asked to see the patient by Dr. Clide Deutscher for evaluation of leg swelling and lymphedema.  The patient is had problems with chronic leg swelling now for a few years but the most severe of this time is been the past year.  She reports the left leg always being the worst of the 2 legs, but over the past several months the right leg swelling has gotten quite noticeable as well.  This is very painful.  Her skin is very tight and firm.  She has had some skin breakdown around the ankle.  She has actually wrapped her legs and what sounds like Ace wraps with some improvement intermittently, but daily compression has never been part of her regimen.  She does try to do exercise with her legs and elevate them.  She has a litany of medical issues but says her cardiac work-up was relatively unrevealing.  She brings in recent labs and her renal function is okay.  She does have poorly controlled diabetes mellitus.  The legs are red and firm.  They are tender to the touch more so on the left than the right.   Past Medical History:  Diagnosis Date  . Arthritis   . Asthma   . COPD (chronic obstructive pulmonary disease) ([place])   . DDD (degenerative disc disease), lumbar   . Diabetes mellitus without complication ([place])   . Hernia of abdominal wall   . Hyperlipidemia   . Hypertension   . Hypothyroidism    hashimotos throiditis  -thyroidectomy  2000  . Neuromuscular disorder ([place])    parkinsons dz-per past diagnosis    Past Surgical History:  Procedure Laterality Date  . ABDOMINAL HYSTERECTOMY    . CHOLECYSTECTOMY    . COLONOSCOPY WITH PROPOFOL N/A 09/08/2014   Procedure: COLONOSCOPY WITH PROPOFOL;  Surgeon: Lucilla Lame, MD;  Location: [facility] ENDOSCOPY;  Service: Endoscopy;  Laterality: N/A;  . HIATAL HERNIA  REPAIR    . parathyroidectomy  2000  . THYROIDECTOMY    . TONSILLECTOMY    . TUMOR REMOVAL Right Leg    Family History Family History  Problem Relation Age of Onset  . Breast cancer Sister   . Kidney disease Mother   No bleeding or clotting disorders.  No aneurysms  Social History Social History   Tobacco Use  . Smoking status: Current Every Day Smoker    Packs/day: 0.50    Years: 48.00    Pack years: 24.00    Types: Cigarettes  . Smokeless tobacco: Never Used  Substance Use Topics  . Alcohol use: No  . Drug use: No    No Known Allergies  Current Outpatient Medications  Medication Sig Dispense Refill  . albuterol (PROVENTIL HFA;VENTOLIN HFA) 108 (90 BASE) MCG/ACT inhaler Inhale 2 puffs into the lungs every 4 (four) hours as needed for wheezing or shortness of breath.    . allopurinol (ZYLOPRIM) 100 MG tablet allopurinol 100 mg tablet    . atorvastatin (LIPITOR) 40 MG tablet     . benazepril (LOTENSIN) 10 MG tablet Take 10 mg by mouth daily.     . furosemide (LASIX) 40 MG tablet Take 40 mg by mouth.    . gabapentin (NEURONTIN) 300 MG capsule 300 mg 3 (three) times  daily.     . indomethacin (INDOCIN) 25 MG capsule Take 25 mg by mouth 2 (two) times daily with a meal.    . levothyroxine (SYNTHROID, LEVOTHROID) 200 MCG tablet Take 200 mcg by mouth daily before breakfast.    . montelukast (SINGULAIR) 10 MG tablet Take 10 mg by mouth at bedtime.    . promethazine (PHENERGAN) 25 MG tablet      No current facility-administered medications for this visit.       REVIEW OF SYSTEMS (Negative unless checked)  Constitutional: [] Weight loss  [] Fever  [] Chills Cardiac: [] Chest pain   [] Chest pressure   [] Palpitations   [] Shortness of breath when laying flat   [] Shortness of breath at rest   [] Shortness of breath with exertion. Vascular:  [x] Pain in legs with walking   [x] Pain in legs at rest   [x] Pain in legs when laying flat   [] Claudication   [] Pain in feet when walking  [] Pain  in feet at rest  [] Pain in feet when laying flat   [] History of DVT   [] Phlebitis   [x] Swelling in legs   [] Varicose veins   [] Non-healing ulcers Pulmonary:   [] Uses home oxygen   [] Productive cough   [] Hemoptysis   [] Wheeze  [x] COPD   [] Asthma Neurologic:  [x] Dizziness  [] Blackouts   [] Seizures   [] History of stroke   [] History of TIA  [] Aphasia   [] Temporary blindness   [] Dysphagia   [] Weakness or numbness in arms   [] Weakness or numbness in legs Musculoskeletal:  [x] Arthritis   [] Joint swelling   [x] Joint pain   [] Low back pain Hematologic:  [] Easy bruising  [] Easy bleeding   [] Hypercoagulable state   [] Anemic  [] Hepatitis Gastrointestinal:  [] Blood in stool   [] Vomiting blood  [x] Gastroesophageal reflux/heartburn   [] Abdominal pain Genitourinary:  [] Chronic kidney disease   [] Difficult urination  [] Frequent urination  [] Burning with urination   [] Hematuria Skin:  [] Rashes   [] Ulcers   [] Wounds Psychological:  [] History of anxiety   []  History of major depression.    Physical Exam BP (!) 168/79 (BP Location: Left Arm, Patient Position: Sitting, Cuff Size: Normal)   Pulse 83   Resp 16   Ht 5\' 8"  (1.727 m)   Wt 270 lb (122.5 kg)   BMI 41.05 kg/m  Gen:  WD/WN, NAD Head: /AT, No temporalis wasting.  Ear/Nose/Throat: Hearing grossly intact, nares w/o erythema or drainage, oropharynx w/o Erythema/Exudate Eyes: Conjunctiva clear, sclera non-icteric  Neck: trachea midline.  No JVD.  Pulmonary:  Good air movement, respirations not labored, no use of accessory muscles  Cardiac: RRR, no JVD Vascular:  Vessel Right Left  Radial Palpable Palpable                          DP  not palpable  not palpable  PT  not palpable  not palpable   Gastrointestinal:. No masses, surgical incisions, or scars. Musculoskeletal: M/S 5/5 throughout.  Erythema and stasis changes are present in both lower extremities worse on the left than the right.  Skin is very firm and thickened worse on the left than  the right.  2-3+ right lower extremity edema, 3-4+ left lower extremity edema Neurologic: Sensation grossly intact in extremities.  Symmetrical.  Speech is fluent. Motor exam as listed above. Psychiatric: Judgment intact, Mood & affect appropriate for pt's clinical situation. Dermatologic: Stasis changes present bilaterally on both lower extremities as described above    Radiology No results found.  Labs No results found for this or any previous visit (from the past 2160 hour(s)).  Assessment/Plan:  Diabetes mellitus type 2, uncomplicated (HCC) blood glucose control important in reducing the progression of atherosclerotic disease. Also, involved in wound healing. On appropriate medications.   Hypertension blood pressure control important in reducing the progression of atherosclerotic disease. On appropriate oral medications.   Cellulitis of leg, left The patient has some erythema in both legs that is likely a combination of inflammation and stasis changes.  With the tenderness to the touch and the pain, there is likely some degree of cellulitis.  We are going to be wrapping this and Unna boot for now and see how she does.  If she does not improve antibiotics per mouth may be necessary.  Swelling of limb Patient has pretty severe swelling bilaterally and clearly has a component of lymphedema from chronic scarring of the lymphatic channels.  Her swelling is really too severe to get her in compression stockings today so we are going to start with Unna boots as she has impending skin breakdown on the left leg and severe swelling bilaterally.  I think this will be our best way to start and plan to do this for 3 to 4 weeks before we get a reflux study that may be useful.  This would also tell us of any thrombotic issues in the venous system.  We can go ahead and get started on getting a lymphedema pump because she clearly has at least stage II if not stage III lymphedema.  She has pretty  significant fibrosis of the skin already.  The patient should elevate her legs as much as possible and increase her activity as much as possible.  We will do weekly Unna boot changes and I will see her back with her duplex in several weeks      Leotis Pain 12/29/2018, 2:27 PM   This note was created with Dragon medical transcription system.  Any errors from dictation are unintentional.

## 2018-12-29 NOTE — Assessment & Plan Note (Signed)
blood pressure control important in reducing the progression of atherosclerotic disease. On appropriate oral medications.  

## 2019-01-05 ENCOUNTER — Other Ambulatory Visit: Payer: Self-pay

## 2019-01-05 ENCOUNTER — Telehealth (INDEPENDENT_AMBULATORY_CARE_PROVIDER_SITE_OTHER): Payer: Self-pay

## 2019-01-05 ENCOUNTER — Ambulatory Visit (INDEPENDENT_AMBULATORY_CARE_PROVIDER_SITE_OTHER): Payer: Medicare Other | Admitting: Nurse Practitioner

## 2019-01-05 VITALS — BP 133/76 | HR 86 | Resp 16 | Ht 68.0 in | Wt 260.0 lb

## 2019-01-05 DIAGNOSIS — L03116 Cellulitis of left lower limb: Secondary | ICD-10-CM | POA: Diagnosis not present

## 2019-01-05 NOTE — Progress Notes (Signed)
History of Present Illness  There is no documented history at this time  Assessments & Plan   There are no diagnoses linked to this encounter.    Additional instructions  Subjective:  Patient presents with venous ulcer of the Bilateral lower extremity.    Procedure:  3 layer unna wrap was placed Bilateral lower extremity.   Plan:   Follow up in one week.  

## 2019-01-05 NOTE — Telephone Encounter (Signed)
Patient was seen today for bilateral unna boots, at patient's request their was an increase in compression strength.

## 2019-01-10 ENCOUNTER — Encounter (INDEPENDENT_AMBULATORY_CARE_PROVIDER_SITE_OTHER): Payer: Self-pay | Admitting: Nurse Practitioner

## 2019-01-12 ENCOUNTER — Ambulatory Visit (INDEPENDENT_AMBULATORY_CARE_PROVIDER_SITE_OTHER): Payer: Medicare Other | Admitting: Nurse Practitioner

## 2019-01-12 ENCOUNTER — Encounter (INDEPENDENT_AMBULATORY_CARE_PROVIDER_SITE_OTHER): Payer: Self-pay

## 2019-01-12 ENCOUNTER — Other Ambulatory Visit: Payer: Self-pay

## 2019-01-12 VITALS — BP 99/63 | HR 86 | Resp 16

## 2019-01-12 DIAGNOSIS — R600 Localized edema: Secondary | ICD-10-CM | POA: Diagnosis not present

## 2019-01-12 DIAGNOSIS — L03116 Cellulitis of left lower limb: Secondary | ICD-10-CM

## 2019-01-12 DIAGNOSIS — M7989 Other specified soft tissue disorders: Secondary | ICD-10-CM

## 2019-01-12 NOTE — Progress Notes (Signed)
History of Present Illness  There is no documented history at this time  Assessments & Plan   There are no diagnoses linked to this encounter.    Additional instructions  Subjective:  Patient presents with venous ulcer of the Bilateral lower extremity.    Procedure:  3 layer unna wrap was placed Bilateral lower extremity.   Plan:   Follow up in one week.  

## 2019-01-13 ENCOUNTER — Encounter (INDEPENDENT_AMBULATORY_CARE_PROVIDER_SITE_OTHER): Payer: Self-pay | Admitting: Nurse Practitioner

## 2019-01-19 ENCOUNTER — Other Ambulatory Visit: Payer: Self-pay

## 2019-01-19 ENCOUNTER — Encounter (INDEPENDENT_AMBULATORY_CARE_PROVIDER_SITE_OTHER): Payer: Self-pay

## 2019-01-19 ENCOUNTER — Ambulatory Visit (INDEPENDENT_AMBULATORY_CARE_PROVIDER_SITE_OTHER): Payer: Medicare Other | Admitting: Nurse Practitioner

## 2019-01-19 VITALS — BP 138/76 | HR 83 | Resp 16

## 2019-01-19 DIAGNOSIS — L03116 Cellulitis of left lower limb: Secondary | ICD-10-CM

## 2019-01-19 NOTE — Progress Notes (Signed)
History of Present Illness  There is no documented history at this time  Assessments & Plan   There are no diagnoses linked to this encounter.    Additional instructions  Subjective:  Patient presents with venous ulcer of the Bilateral lower extremity.    Procedure:  3 layer unna wrap was placed Bilateral lower extremity.   Plan:   Follow up in one week.  

## 2019-01-26 ENCOUNTER — Ambulatory Visit (INDEPENDENT_AMBULATORY_CARE_PROVIDER_SITE_OTHER): Payer: Medicare Other | Admitting: Vascular Surgery

## 2019-01-26 ENCOUNTER — Encounter (INDEPENDENT_AMBULATORY_CARE_PROVIDER_SITE_OTHER): Payer: Self-pay | Admitting: Vascular Surgery

## 2019-01-26 ENCOUNTER — Other Ambulatory Visit: Payer: Self-pay

## 2019-01-26 ENCOUNTER — Ambulatory Visit (INDEPENDENT_AMBULATORY_CARE_PROVIDER_SITE_OTHER): Payer: Medicare Other

## 2019-01-26 VITALS — BP 118/73 | HR 83 | Resp 16

## 2019-01-26 DIAGNOSIS — R600 Localized edema: Secondary | ICD-10-CM | POA: Diagnosis not present

## 2019-01-26 DIAGNOSIS — M7989 Other specified soft tissue disorders: Secondary | ICD-10-CM

## 2019-01-26 DIAGNOSIS — E119 Type 2 diabetes mellitus without complications: Secondary | ICD-10-CM | POA: Diagnosis not present

## 2019-01-26 DIAGNOSIS — L03116 Cellulitis of left lower limb: Secondary | ICD-10-CM | POA: Diagnosis not present

## 2019-01-26 DIAGNOSIS — I10 Essential (primary) hypertension: Secondary | ICD-10-CM | POA: Diagnosis not present

## 2019-01-26 DIAGNOSIS — I890 Lymphedema, not elsewhere classified: Secondary | ICD-10-CM | POA: Insufficient documentation

## 2019-01-26 NOTE — Progress Notes (Signed)
MRN : [id number]  Sarah Norton is a 63 y.o. (January 14, 1956) female who presents with chief complaint of  Chief Complaint  Patient presents with  . Follow-up    unna check  .  History of Present Illness: Patient returns today in follow up of her leg pain and swelling.  She has been doing Unna boots now for roughly a month now and that has resulted in improvement in terms of the pain and swelling in both legs.  This is far from resolve the pain and swelling.  She has had no weeping or ulceration.  No fevers or chills.  Duplex studies today show reflux in the right common femoral vein, great saphenous vein, and small saphenous vein.  On the left, reflux is seen only in the common femoral vein.  Current Outpatient Medications  Medication Sig Dispense Refill  . albuterol (PROVENTIL HFA;VENTOLIN HFA) 108 (90 BASE) MCG/ACT inhaler Inhale 2 puffs into the lungs every 4 (four) hours as needed for wheezing or shortness of breath.    . allopurinol (ZYLOPRIM) 100 MG tablet allopurinol 100 mg tablet    . atorvastatin (LIPITOR) 40 MG tablet     . benazepril (LOTENSIN) 10 MG tablet Take 10 mg by mouth daily.     . furosemide (LASIX) 40 MG tablet Take 40 mg by mouth.    . gabapentin (NEURONTIN) 300 MG capsule 300 mg 3 (three) times daily.     . indomethacin (INDOCIN) 25 MG capsule Take 25 mg by mouth 2 (two) times daily with a meal.    . levothyroxine (SYNTHROID, LEVOTHROID) 200 MCG tablet Take 200 mcg by mouth daily before breakfast.    . montelukast (SINGULAIR) 10 MG tablet Take 10 mg by mouth at bedtime.    . promethazine (PHENERGAN) 25 MG tablet      No current facility-administered medications for this visit.     Past Medical History:  Diagnosis Date  . Arthritis   . Asthma   . COPD (chronic obstructive pulmonary disease) ([place])   . DDD (degenerative disc disease), lumbar   . Diabetes mellitus without complication ([place])   . Hernia of abdominal wall   . Hyperlipidemia   . Hypertension   .  Hypothyroidism    hashimotos throiditis  -thyroidectomy  2000  . Neuromuscular disorder ([place])    parkinsons dz-per past diagnosis    Past Surgical History:  Procedure Laterality Date  . ABDOMINAL HYSTERECTOMY    . CHOLECYSTECTOMY    . COLONOSCOPY WITH PROPOFOL N/A 09/08/2014   Procedure: COLONOSCOPY WITH PROPOFOL;  Surgeon: Lucilla Lame, MD;  Location: [facility] ENDOSCOPY;  Service: Endoscopy;  Laterality: N/A;  . HIATAL HERNIA REPAIR    . parathyroidectomy  2000  . THYROIDECTOMY    . TONSILLECTOMY    . TUMOR REMOVAL Right Leg     Social History   Tobacco Use  . Smoking status: Current Every Day Smoker    Packs/day: 0.50    Years: 48.00    Pack years: 24.00    Types: Cigarettes  . Smokeless tobacco: Never Used  Substance Use Topics  . Alcohol use: No  . Drug use: No    Family History  Problem Relation Age of Onset  . Breast cancer Sister   . Kidney disease Mother     No Known Allergies   REVIEW OF SYSTEMS (Negative unless checked)  Constitutional: [] ?Weight loss  [] ?Fever  [] ?Chills Cardiac: [] ?Chest pain   [] ?Chest pressure   [] ?Palpitations   [] ?Shortness of  breath when laying flat   [] ?Shortness of breath at rest   [] ?Shortness of breath with exertion. Vascular:  [x] ?Pain in legs with walking   [x] ?Pain in legs at rest   [x] ?Pain in legs when laying flat   [] ?Claudication   [] ?Pain in feet when walking  [] ?Pain in feet at rest  [] ?Pain in feet when laying flat   [] ?History of DVT   [] ?Phlebitis   [x] ?Swelling in legs   [] ?Varicose veins   [] ?Non-healing ulcers Pulmonary:   [] ?Uses home oxygen   [] ?Productive cough   [] ?Hemoptysis   [] ?Wheeze  [x] ?COPD   [] ?Asthma Neurologic:  [x] ?Dizziness  [] ?Blackouts   [] ?Seizures   [] ?History of stroke   [] ?History of TIA  [] ?Aphasia   [] ?Temporary blindness   [] ?Dysphagia   [] ?Weakness or numbness in arms   [] ?Weakness or numbness in legs Musculoskeletal:  [x] ?Arthritis   [] ?Joint swelling   [x] ?Joint pain   [] ?Low back pain  Hematologic:  [] ?Easy bruising  [] ?Easy bleeding   [] ?Hypercoagulable state   [] ?Anemic  [] ?Hepatitis Gastrointestinal:  [] ?Blood in stool   [] ?Vomiting blood  [x] ?Gastroesophageal reflux/heartburn   [] ?Abdominal pain Genitourinary:  [] ?Chronic kidney disease   [] ?Difficult urination  [] ?Frequent urination  [] ?Burning with urination   [] ?Hematuria Skin:  [] ?Rashes   [] ?Ulcers   [] ?Wounds Psychological:  [] ?History of anxiety   [] ? History of major depression.  Physical Examination  BP 118/73 (BP Location: Right Arm)   Pulse 83   Resp 16  Gen:  WD/WN, NAD Head: /AT, No temporalis wasting. Ear/Nose/Throat: Hearing grossly intact, nares w/o erythema or drainage Eyes: Conjunctiva clear. Sclera non-icteric Neck: Supple.  Trachea midline Pulmonary:  Good air movement, no use of accessory muscles.  Cardiac: RRR, no JVD Vascular:  Vessel Right Left  Radial Palpable Palpable                       Musculoskeletal: M/S 5/5 throughout.  No deformity or atrophy.  Moderate stasis dermatitis changes are present bilaterally.  1+ bilateral lower extremity edema. Neurologic: Sensation grossly intact in extremities.  Symmetrical.  Speech is fluent.  Psychiatric: Judgment intact, Mood & affect appropriate for pt's clinical situation. Dermatologic: No rashes or ulcers noted.  No cellulitis or open wounds.       Labs No results found for this or any previous visit (from the past 2160 hour(s)).  Radiology No results found.  Assessment/Plan Diabetes mellitus type 2, uncomplicated (HCC) blood glucose control important in reducing the progression of atherosclerotic disease. Also, involved in wound healing. On appropriate medications.   Hypertension blood pressure control important in reducing the progression of atherosclerotic disease. On appropriate oral medications.  Lymphedema Duplex studies today show reflux in the right common femoral vein, great saphenous vein, and small  saphenous vein.  On the left, reflux is seen only in the common femoral vein. The patient has developed lymphedema from chronic scarring and lymphatic channels with her chronic swelling.  She would have stage II lymphedema with tissue changes present and edema persisting despite elevation and compression.  Her skin is relatively stable and her swelling has improved enough that I think we can come out of the boot and go to compression stockings daily.  I believe the addition of a lymphedema pump would be an excellent adjuvant therapy.  I am going to hold on any venous intervention for now because her symptoms are bilateral and the only superficial venous reflux was seen on the right.  We will see  the patient back in 2 to 3 months to assess her response to conservative therapy and to determine whether or not to consider intervention for her venous disease on the right leg.  Swelling of limb Duplex studies today show reflux in the right common femoral vein, great saphenous vein, and small saphenous vein.  On the left, reflux is seen only in the common femoral vein. The patient has developed lymphedema from chronic scarring and lymphatic channels with her chronic swelling.  She would have stage II lymphedema with tissue changes present and edema persisting despite elevation and compression.  Her skin is relatively stable and her swelling has improved enough that I think we can come out of the boot and go to compression stockings daily.  I believe the addition of a lymphedema pump would be an excellent adjuvant therapy.  I am going to hold on any venous intervention for now because her symptoms are bilateral and the only superficial venous reflux was seen on the right.  We will see the patient back in 2 to 3 months to assess her response to conservative therapy and to determine whether or not to consider intervention for her venous disease on the right leg.    Leotis Pain, MD  01/26/2019 2:44 PM    This  note was created with Dragon medical transcription system.  Any errors from dictation are purely unintentional

## 2019-01-26 NOTE — Assessment & Plan Note (Signed)
Duplex studies today show reflux in the right common femoral vein, great saphenous vein, and small saphenous vein.  On the left, reflux is seen only in the common femoral vein. The patient has developed lymphedema from chronic scarring and lymphatic channels with her chronic swelling.  She would have stage II lymphedema with tissue changes present and edema persisting despite elevation and compression.  Her skin is relatively stable and her swelling has improved enough that I think we can come out of the boot and go to compression stockings daily.  I believe the addition of a lymphedema pump would be an excellent adjuvant therapy.  I am going to hold on any venous intervention for now because her symptoms are bilateral and the only superficial venous reflux was seen on the right.  We will see the patient back in 2 to 3 months to assess her response to conservative therapy and to determine whether or not to consider intervention for her venous disease on the right leg.

## 2019-01-26 NOTE — Patient Instructions (Signed)

## 2019-04-13 ENCOUNTER — Ambulatory Visit (INDEPENDENT_AMBULATORY_CARE_PROVIDER_SITE_OTHER): Payer: Medicare Other | Admitting: Vascular Surgery

## 2019-06-29 ENCOUNTER — Telehealth (INDEPENDENT_AMBULATORY_CARE_PROVIDER_SITE_OTHER): Payer: Self-pay

## 2019-06-29 NOTE — Telephone Encounter (Signed)
The pt left a message on the nurse's line saying she needs new compression stocking that are stronger due to her still having swelling with the ones she has and she has to put tape around them to get the appropriate amount of compression. The pt also wanted to know if the RX could be mailed to her.

## 2019-06-29 NOTE — Telephone Encounter (Signed)
I mailed the Rx off to the pt and called the pt and made her aware.

## 2019-06-29 NOTE — Telephone Encounter (Signed)
I placed Rx on your desk if you could write in name pleas e

## 2020-01-21 ENCOUNTER — Telehealth: Payer: Self-pay | Admitting: General Practice

## 2020-01-21 NOTE — Telephone Encounter (Signed)
No notes where this patient had been contacted.  This patient is not established with our office and I do not see where she has an open referral either.  Encounter closed.

## 2020-02-18 ENCOUNTER — Institutional Professional Consult (permissible substitution): Payer: Medicare HMO | Admitting: Internal Medicine

## 2020-03-29 ENCOUNTER — Institutional Professional Consult (permissible substitution): Payer: Medicare Other | Admitting: Internal Medicine

## 2020-05-02 ENCOUNTER — Encounter: Payer: Self-pay | Admitting: Internal Medicine

## 2020-05-02 ENCOUNTER — Ambulatory Visit (INDEPENDENT_AMBULATORY_CARE_PROVIDER_SITE_OTHER): Payer: Medicare Other | Admitting: Internal Medicine

## 2020-05-02 ENCOUNTER — Other Ambulatory Visit: Payer: Self-pay

## 2020-05-02 VITALS — BP 132/78 | HR 89 | Temp 97.8°F | Ht 68.0 in | Wt 275.0 lb

## 2020-05-02 DIAGNOSIS — J449 Chronic obstructive pulmonary disease, unspecified: Secondary | ICD-10-CM | POA: Diagnosis not present

## 2020-05-02 MED ORDER — ADVAIR HFA 115-21 MCG/ACT IN AERO: 2.0000 | INHALATION_SPRAY | Freq: Two times a day (BID) | RESPIRATORY_TRACT | 12 refills | Status: DC

## 2020-05-02 MED ORDER — SPIRIVA RESPIMAT 1.25 MCG/ACT IN AERS: 2.0000 | INHALATION_SPRAY | RESPIRATORY_TRACT | 6 refills | Status: DC

## 2020-05-02 NOTE — Progress Notes (Signed)
Name: Sarah Norton MRN: [id number] DOB: 05-Oct-1955     CONSULTATION DATE: 05/02/2020 REFERRING MD : Clide Deutscher  CHIEF COMPLAINT: SOB  STUDIES:      CT chest Independently reviewed by Me scan on 08/2016 No acute issues no pneumonia No masses seen  HISTORY OF PRESENT ILLNESS: 65 year old white female seen today for progressive shortness of breath and dyspnea exertion Patient with a longtime smoking history of 50 years Based on signs and symptoms and smoking history patient has a diagnosis of COPD At this time patient has a recent COPD exacerbation and completed antibiotics and prednisone Patient states she feels better however still fatigued shortness of breath and dyspnea exertion  Patient is a former smoker 1 pack/day for 50 years however has now reverted to vaping  I have explained to patient that she will need to stop vaping and stop smoking and avoid secondhand smoke she will also need several tests to assess her lung function  Based on her longstanding smoking history and her age she meets criteria for lung cancer screening referral program which was explained to the patient      Smoking Assessment and Cessation Counseling Upon further questioning, Patient smokes 1 ppd I have advised patient to quit/stop smoking as soon as possible due to high risk for multiple medical problems  Patient is willing to quit smoking   I have advised patient that we can assist and have options of Nicotine replacement therapy. I also advised patient on behavioral therapy and can provide oral medication therapy in conjunction with the other therapies Follow up next Office visit  for assessment of smoking cessation Smoking cessation counseling advised for 4 minutes     PAST MEDICAL HISTORY :   has a past medical history of Arthritis, Asthma, COPD (chronic obstructive pulmonary disease) ([place]), DDD (degenerative disc disease), lumbar, Diabetes mellitus without complication ([place]), Hernia of  abdominal wall, Hyperlipidemia, Hypertension, Hypothyroidism, and Neuromuscular disorder ([place]).  has a past surgical history that includes Thyroidectomy; Abdominal hysterectomy; Cholecystectomy; Tonsillectomy; Hiatal hernia repair; Tumor removal (Right, Leg); Colonoscopy with propofol (N/A, 09/08/2014); and parathyroidectomy (2000). Prior to Admission medications   Medication Sig Start Date End Date Taking? Authorizing Provider  atorvastatin (LIPITOR) 40 MG tablet Take 40 mg by mouth daily.   Yes [provider]  chlorhexidine (HIBICLENS) 4 % external liquid Apply topically daily as needed.   Yes [provider]  chlorhexidine (PERIDEX) 0.12 % solution Use as directed 15 mLs in the mouth or throat 2 (two) times daily.   Yes [provider]  indomethacin (INDOCIN) 25 MG capsule Take 25 mg by mouth 2 (two) times daily with a meal.   Yes [provider]  levothyroxine (SYNTHROID) 200 MCG tablet Take 200 mcg by mouth daily before breakfast.   Yes [provider]  montelukast (SINGULAIR) 10 MG tablet Take 10 mg by mouth at bedtime.   Yes [provider]  penicillin v potassium (VEETID) 250 MG tablet Take 250 mg by mouth 4 (four) times daily.   Yes [provider]  promethazine (PHENERGAN) 25 MG tablet Take 25 mg by mouth every 6 (six) hours as needed for nausea or vomiting.   Yes [provider]  tiotropium (SPIRIVA) 18 MCG inhalation capsule Place 18 mcg into inhaler and inhale daily.   Yes [provider]  tiZANidine (ZANAFLEX) 4 MG tablet Take 4 mg by mouth every 6 (six) hours as needed for muscle spasms.   Yes [provider]  No Known Allergies  FAMILY HISTORY:  family history includes Breast cancer in her sister; Kidney disease in her mother. SOCIAL HISTORY:  reports that she quit smoking about 4 years ago. Her smoking use included cigarettes. She has a 48.00 pack-year smoking history. She has never used  smokeless tobacco. She reports that she does not drink alcohol and does not use drugs.    Review of Systems:  Gen:  Denies  fever, sweats, chills weigh loss  HEENT: Denies blurred vision, double vision, ear pain, eye pain, hearing loss, nose bleeds, sore throat Cardiac:  No dizziness, chest pain or heaviness, chest tightness,edema, No JVD Resp:   + cough, -sputum production, +shortness of breath,-wheezing, -hemoptysis,  Gi: Denies swallowing difficulty, stomach pain, nausea or vomiting, diarrhea, constipation, bowel incontinence Gu:  Denies bladder incontinence, burning urine Ext:   Denies Joint pain, stiffness or swelling Skin: Denies  skin rash, easy bruising or bleeding or hives Endoc:  Denies polyuria, polydipsia , polyphagia or weight change Psych:   Denies depression, insomnia or hallucinations  Other:  All other systems negative   BP 132/78 (BP Location: Left Arm, Cuff Size: Large)   Pulse 89   Temp 97.8 F (36.6 C) (Temporal)   Ht 5\' 8"  (1.727 m)   Wt 275 lb (124.7 kg) Comment: weight is per patient  SpO2 92%   BMI 41.81 kg/m        Physical Examination:   GENERAL:NAD, no fevers, chills, + weakness + fatigue HEAD: Normocephalic, atraumatic.  EYES: PERLA, EOMI No scleral icterus.  MOUTH: Moist mucosal membrane.  EAR, NOSE, THROAT: Clear without exudates. No external lesions.  NECK: Supple.  PULMONARY: CTA B/L no wheezing, rhonchi, crackles CARDIOVASCULAR: S1 and S2. Regular rate and rhythm. No murmurs GASTROINTESTINAL: Soft, nontender, nondistended. Positive bowel sounds.  MUSCULOSKELETAL: No swelling, clubbing, or edema.  NEUROLOGIC: No gross focal neurological deficits. 5/5 strength all extremities SKIN: No ulceration, lesions, rashes, or cyanosis.  PSYCHIATRIC: Insight, judgment intact. -depression -anxiety ALL OTHER ROS ARE NEGATIVE   MEDICATIONS: I have reviewed all medications and confirmed regimen as documented       ASSESSMENT AND  PLAN SYNOPSIS  65 year old morbidly obese white female seen today for assessment for COPD based on her longstanding smoking history with extensive smoking history and currently vaping with a recent bout of COPD exacerbation  Shortness of breath and dyspnea exertion related to her COPD Severe deconditioned state Morbid obesity Poor respiratory insufficiency   COPD Obtain pulmonary function testing to assess lung function Patient has refused 6-minute walk test to assess for exertional hypoxia Patient also refusing overnight pulse oximetry Patient was explained patient needs testing to assess for oxygen and she may be at a high risk for increased shortness of breath and heart problems and death Patient refuses to walk for 6-minute walk test and refuses overnight pulse oximetry Will start Spiriva Respimat 1.25 We will start Advair HFA Albuterol as needed No indication for prednisone or antibiotics at this time  Smoking cessation strongly advised   Extensive smoking history Patient to be referred to lung cancer screening protocol  Overall prognosis is poor patient with morbid obesity with severe deconditioned state in the setting of longstanding smoking history Patient has very poor respiratory insufficiency   COVID-19 EDUCATION: The signs and symptoms of COVID-19 were discussed with the patient and how to seek care for testing (follow up with PCP or arrange E-visit).  The importance of social distancing was discussed today.  MEDICATION ADJUSTMENTS/LABS AND TESTS ORDERED:  Obtain pulmonary function testing Obtain overnight pulse oximetry  Patient refusing to walk at this time to get 6-minute walk test   Start Spiriva Respimat 1.25 Start Advair HFA CURRENT MEDICATIONS REVIEWED AT LENGTH WITH PATIENT TODAY   Patient/Family are satisfied with Plan of action and management. All questions answered  Follow up 6 months  TOTAL TIME SPENT 46 mins  Corrin Parker, M.D.   [facility]  Medical Director [place] Director [facility] Cardio-Pulmonary Department

## 2020-05-02 NOTE — Patient Instructions (Addendum)
Obtain pulmonary function testing Obtain overnight pulse oximetry  Patient refusing to walk at this time to get 6-minute walk test   Start Spiriva Respimat 1.25 Start Advair HFA Use albuterol as needed  Lung cancer screening referral

## 2020-05-03 ENCOUNTER — Other Ambulatory Visit: Payer: Self-pay

## 2020-05-03 ENCOUNTER — Emergency Department: Payer: Medicare Other

## 2020-05-03 ENCOUNTER — Observation Stay
Admission: EM | Admit: 2020-05-03 | Discharge: 2020-05-04 | Disposition: A | Discharge status: Home / Self Care | Payer: Medicare Other | Attending: Internal Medicine | Admitting: Internal Medicine

## 2020-05-03 ENCOUNTER — Observation Stay: Payer: Medicare Other

## 2020-05-03 ENCOUNTER — Telehealth: Payer: Self-pay | Admitting: Internal Medicine

## 2020-05-03 DIAGNOSIS — D751 Secondary polycythemia: Secondary | ICD-10-CM | POA: Diagnosis not present

## 2020-05-03 DIAGNOSIS — E079 Disorder of thyroid, unspecified: Secondary | ICD-10-CM | POA: Diagnosis present

## 2020-05-03 DIAGNOSIS — L0390 Cellulitis, unspecified: Secondary | ICD-10-CM | POA: Diagnosis present

## 2020-05-03 DIAGNOSIS — E063 Autoimmune thyroiditis: Secondary | ICD-10-CM | POA: Diagnosis not present

## 2020-05-03 DIAGNOSIS — G20 Parkinson's disease: Secondary | ICD-10-CM | POA: Diagnosis not present

## 2020-05-03 DIAGNOSIS — Z87891 Personal history of nicotine dependence: Secondary | ICD-10-CM | POA: Insufficient documentation

## 2020-05-03 DIAGNOSIS — J449 Chronic obstructive pulmonary disease, unspecified: Secondary | ICD-10-CM

## 2020-05-03 DIAGNOSIS — Z5321 Procedure and treatment not carried out due to patient leaving prior to being seen by health care provider: Secondary | ICD-10-CM | POA: Diagnosis not present

## 2020-05-03 DIAGNOSIS — G20C Parkinsonism, unspecified: Secondary | ICD-10-CM | POA: Diagnosis present

## 2020-05-03 DIAGNOSIS — I890 Lymphedema, not elsewhere classified: Secondary | ICD-10-CM

## 2020-05-03 DIAGNOSIS — D45 Polycythemia vera: Secondary | ICD-10-CM | POA: Insufficient documentation

## 2020-05-03 DIAGNOSIS — I10 Essential (primary) hypertension: Secondary | ICD-10-CM | POA: Diagnosis present

## 2020-05-03 DIAGNOSIS — Z79899 Other long term (current) drug therapy: Secondary | ICD-10-CM | POA: Diagnosis not present

## 2020-05-03 DIAGNOSIS — Z20822 Contact with and (suspected) exposure to covid-19: Secondary | ICD-10-CM | POA: Diagnosis not present

## 2020-05-03 DIAGNOSIS — D72828 Other elevated white blood cell count: Secondary | ICD-10-CM

## 2020-05-03 DIAGNOSIS — E119 Type 2 diabetes mellitus without complications: Secondary | ICD-10-CM

## 2020-05-03 DIAGNOSIS — J45909 Unspecified asthma, uncomplicated: Secondary | ICD-10-CM | POA: Insufficient documentation

## 2020-05-03 DIAGNOSIS — L03113 Cellulitis of right upper limb: Secondary | ICD-10-CM | POA: Diagnosis not present

## 2020-05-03 DIAGNOSIS — L03011 Cellulitis of right finger: Secondary | ICD-10-CM | POA: Diagnosis not present

## 2020-05-03 LAB — CBC WITH DIFFERENTIAL/PLATELET
Abs Immature Granulocytes: 0.08 10*3/uL — ABNORMAL HIGH (ref 0.00–0.07)
Basophils Absolute: 0.1 10*3/uL (ref 0.0–0.1)
Basophils Relative: 1 %
Eosinophils Absolute: 0.1 10*3/uL (ref 0.0–0.5)
Eosinophils Relative: 1 %
HCT: 57.4 % — ABNORMAL HIGH (ref 36.0–46.0)
Hemoglobin: 19.9 g/dL — ABNORMAL HIGH (ref 12.0–15.0)
Immature Granulocytes: 1 %
Lymphocytes Relative: 15 %
Lymphs Abs: 2.3 10*3/uL (ref 0.7–4.0)
MCH: 32.3 pg (ref 26.0–34.0)
MCHC: 34.7 g/dL (ref 30.0–36.0)
MCV: 93 fL (ref 80.0–100.0)
Monocytes Absolute: 0.9 10*3/uL (ref 0.1–1.0)
Monocytes Relative: 6 %
Neutro Abs: 11.6 10*3/uL — ABNORMAL HIGH (ref 1.7–7.7)
Neutrophils Relative %: 76 %
Platelets: 257 10*3/uL (ref 150–400)
RBC: 6.17 MIL/uL — ABNORMAL HIGH (ref 3.87–5.11)
RDW: 14.6 % (ref 11.5–15.5)
WBC: 15 10*3/uL — ABNORMAL HIGH (ref 4.0–10.5)
nRBC: 0 % (ref 0.0–0.2)

## 2020-05-03 LAB — COMPREHENSIVE METABOLIC PANEL
ALT: 61 U/L — ABNORMAL HIGH (ref 0–44)
AST: 27 U/L (ref 15–41)
Albumin: 3.3 g/dL — ABNORMAL LOW (ref 3.5–5.0)
Alkaline Phosphatase: 101 U/L (ref 38–126)
Anion gap: 9 (ref 5–15)
BUN: 16 mg/dL (ref 8–23)
CO2: 35 mmol/L — ABNORMAL HIGH (ref 22–32)
Calcium: 9.5 mg/dL (ref 8.9–10.3)
Chloride: 89 mmol/L — ABNORMAL LOW (ref 98–111)
Creatinine, Ser: 0.75 mg/dL (ref 0.44–1.00)
GFR, Estimated: >60 mL/min (ref >60)
Glucose, Bld: 141 mg/dL — ABNORMAL HIGH (ref 70–99)
Potassium: 4.1 mmol/L (ref 3.5–5.1)
Sodium: 133 mmol/L — ABNORMAL LOW (ref 135–145)
Total Bilirubin: 0.9 mg/dL (ref 0.3–1.2)
Total Protein: 6.2 g/dL — ABNORMAL LOW (ref 6.5–8.1)

## 2020-05-03 LAB — LACTIC ACID, PLASMA: Lactic Acid, Venous: 1.4 mmol/L (ref 0.5–1.9)

## 2020-05-03 LAB — RESP PANEL BY RT-PCR (FLU A&B, COVID) ARPGX2
Influenza A by PCR: NEGATIVE
Influenza B by PCR: NEGATIVE
SARS Coronavirus 2 by RT PCR: NEGATIVE

## 2020-05-03 LAB — SEDIMENTATION RATE: Sed Rate: 2 mm/hr (ref 0–30)

## 2020-05-03 LAB — URIC ACID: Uric Acid, Serum: 3.8 mg/dL (ref 2.5–7.1)

## 2020-05-03 MED ORDER — ALBUTEROL SULFATE HFA 108 (90 BASE) MCG/ACT IN AERS
1.0000 | INHALATION_SPRAY | RESPIRATORY_TRACT | Status: DC | PRN
  Filled 2020-05-03: qty 6.7

## 2020-05-03 MED ORDER — SODIUM CHLORIDE 0.9 % IV BOLUS
1000.0000 mL | Freq: Once | INTRAVENOUS | Status: AC
  Administered 2020-05-03: 1000 mL via INTRAVENOUS

## 2020-05-03 MED ORDER — HEPARIN SODIUM (PORCINE) 5000 UNIT/ML IJ SOLN
5000.0000 [IU] | Freq: Three times a day (TID) | INTRAMUSCULAR | Status: DC
  Administered 2020-05-04: 5000 [IU] via SUBCUTANEOUS
  Filled 2020-05-03: qty 1

## 2020-05-03 MED ORDER — POLYETHYLENE GLYCOL 3350 17 G PO PACK: 17.0000 g | PACK | Freq: Every day | ORAL | Status: DC | PRN

## 2020-05-03 MED ORDER — LORAZEPAM 2 MG/ML IJ SOLN: 0.5000 mg | Freq: Once | INTRAMUSCULAR | Status: DC | PRN

## 2020-05-03 MED ORDER — ACETAMINOPHEN 650 MG RE SUPP: 650.0000 mg | Freq: Four times a day (QID) | RECTAL | Status: DC | PRN

## 2020-05-03 MED ORDER — LEVOTHYROXINE SODIUM 50 MCG PO TABS
200.0000 ug | ORAL_TABLET | Freq: Every day | ORAL | Status: DC
  Administered 2020-05-04: 200 ug via ORAL
  Filled 2020-05-03: qty 4

## 2020-05-03 MED ORDER — ACETAMINOPHEN 325 MG PO TABS: 650.0000 mg | ORAL_TABLET | Freq: Four times a day (QID) | ORAL | Status: DC | PRN

## 2020-05-03 MED ORDER — MOMETASONE FURO-FORMOTEROL FUM 200-5 MCG/ACT IN AERO: 2.0000 | INHALATION_SPRAY | Freq: Two times a day (BID) | RESPIRATORY_TRACT | Status: DC

## 2020-05-03 MED ORDER — MONTELUKAST SODIUM 10 MG PO TABS
10.0000 mg | ORAL_TABLET | Freq: Every day | ORAL | Status: DC
  Administered 2020-05-04: 10 mg via ORAL
  Filled 2020-05-03 (×2): qty 1

## 2020-05-03 MED ORDER — INSULIN GLARGINE 100 UNIT/ML ~~LOC~~ SOLN
35.0000 [IU] | Freq: Two times a day (BID) | SUBCUTANEOUS | Status: DC
  Administered 2020-05-04: 35 [IU] via SUBCUTANEOUS
  Filled 2020-05-03 (×3): qty 0.35

## 2020-05-03 MED ORDER — LORAZEPAM 2 MG/ML IJ SOLN
0.5000 mg | Freq: Once | INTRAMUSCULAR | Status: AC | PRN
  Administered 2020-05-03: 0.5 mg via INTRAVENOUS
  Filled 2020-05-03: qty 1

## 2020-05-03 MED ORDER — VANCOMYCIN HCL IN DEXTROSE 1-5 GM/200ML-% IV SOLN
1000.0000 mg | Freq: Once | INTRAVENOUS | Status: AC
  Administered 2020-05-03: 1000 mg via INTRAVENOUS
  Filled 2020-05-03: qty 200

## 2020-05-03 MED ORDER — SODIUM CHLORIDE 0.9% FLUSH
3.0000 mL | Freq: Two times a day (BID) | INTRAVENOUS | Status: DC
  Administered 2020-05-04: 3 mL via INTRAVENOUS

## 2020-05-03 MED ORDER — TIOTROPIUM BROMIDE MONOHYDRATE 1.25 MCG/ACT IN AERS: 2.0000 | INHALATION_SPRAY | RESPIRATORY_TRACT | Status: DC

## 2020-05-03 MED ORDER — SODIUM CHLORIDE 0.9 % IV SOLN: INTRAVENOUS | Status: DC

## 2020-05-03 MED ORDER — INSULIN ASPART 100 UNIT/ML ~~LOC~~ SOLN: 0.0000 [IU] | Freq: Three times a day (TID) | SUBCUTANEOUS | Status: DC

## 2020-05-03 MED ORDER — GADOBUTROL 1 MMOL/ML IV SOLN
10.0000 mL | Freq: Once | INTRAVENOUS | Status: AC | PRN
  Administered 2020-05-04: 10 mL via INTRAVENOUS

## 2020-05-03 NOTE — ED Triage Notes (Signed)
Pt to ER after being sent by her pcp for a possible bone infection in her right hand. Pt has obvious swelling/ deformity to first finger knuckle. States she just finished a course of antibiotics with some improvement in appearance. Pt denies fevers at home. Pt has full range of motion in extremity.

## 2020-05-03 NOTE — ED Provider Notes (Signed)
[facility] Emergency Department Provider Note  ____________________________________________   Event Date/Time   First MD Initiated Contact with Patient 05/03/20 2044     (approximate)  I have reviewed the triage vital signs and the nursing notes.   HISTORY  Chief Complaint Hand Problem    HPI Sarah Norton is a 65 y.o. female with history of diabetes here with right hand pain and swelling.  The patient states that she has had recurrent infection of her right dorsal hand for the last several weeks.  She just completed a 7-day course of clindamycin yesterday, and states that she has had persistent and worsening redness, pain, swelling, extending over the dorsum of her hand.  She has also had an area of drainage that has drained recently.  She states that she notified her PCP who evaluated her and sent her here for IV antibiotics and possible further imaging.  The pain is worse with any kind of palpation.  Denies any alleviating factors.  Pain is worse with movement of her index finger.  Denies any distal numbness or weakness.  Denies known history of MRSA.        Past Medical History:  Diagnosis Date  . Arthritis   . Asthma   . COPD (chronic obstructive pulmonary disease) ([place])   . DDD (degenerative disc disease), lumbar   . Diabetes mellitus without complication ([place])   . Hernia of abdominal wall   . Hyperlipidemia   . Hypertension   . Hypothyroidism    hashimotos throiditis  -thyroidectomy  2000  . Neuromuscular disorder ([place])    parkinsons dz-per past diagnosis    Patient Active Problem List   Diagnosis Date Noted  . Cellulitis 05/03/2020  . COPD (chronic obstructive pulmonary disease) ([place]) 05/03/2020  . Polycythemia 05/03/2020  . Lymphedema 01/26/2019  . Arthritis 12/29/2018  . Diabetes mellitus type 2, uncomplicated ([place]) 14/48/1856  . Hypertension 12/29/2018  . Thyroid disease 12/29/2018  . Tremor 12/29/2018  . Cellulitis of leg,  left 12/29/2018  . Swelling of limb 12/29/2018  . Mass of wrist 12/24/2017  . Medial epicondylitis 12/16/2017  . 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 Bilateral leg weakness 12/22/2014    Past Surgical History:  Procedure Laterality Date  . ABDOMINAL HYSTERECTOMY    . CHOLECYSTECTOMY    . COLONOSCOPY WITH PROPOFOL N/A 09/08/2014   Procedure: COLONOSCOPY WITH PROPOFOL;  Surgeon: Lucilla Lame, MD;  Location: [facility] ENDOSCOPY;  Service: Endoscopy;  Laterality: N/A;  . HIATAL HERNIA REPAIR    . parathyroidectomy  2000  . THYROIDECTOMY    . TONSILLECTOMY    . TUMOR REMOVAL Right Leg    Prior to Admission medications   Medication Sig Start Date End Date Taking? Authorizing Provider  atorvastatin (LIPITOR) 40 MG tablet Take 40 mg by mouth daily.    [provider]  chlorhexidine (HIBICLENS) 4 % external liquid Apply topically daily as needed.    [provider]  chlorhexidine (PERIDEX) 0.12 % solution Use as directed 15 mLs in the mouth or throat 2 (two) times daily.    [provider]  fluticasone-salmeterol (ADVAIR HFA) 115-21 MCG/ACT inhaler Inhale 2 puffs into the lungs 2 (two) times daily. 05/02/20   Flora Lipps, MD  indomethacin (INDOCIN) 25 MG capsule Take 25 mg by mouth 2 (two) times daily with a meal.    [provider]  levothyroxine (SYNTHROID) 200 MCG tablet Take 200 mcg by mouth daily before breakfast.    [provider]  montelukast (SINGULAIR) 10 MG tablet Take 10 mg by mouth at bedtime.    [provider]  penicillin v potassium (VEETID) 250 MG tablet Take 250 mg by mouth 4 (four) times daily.    [provider]  promethazine (PHENERGAN) 25 MG tablet Take 25 mg by mouth every 6 (six) hours as needed for nausea or vomiting.    [provider]  tiotropium (SPIRIVA) 18 MCG inhalation capsule Place 18 mcg into inhaler and inhale daily.    [provider]  Tiotropium Bromide Monohydrate (SPIRIVA  RESPIMAT) 1.25 MCG/ACT AERS Inhale 2 puffs into the lungs 1 day or 1 dose for 1 dose. 05/02/20 05/03/20  Flora Lipps, MD  tiZANidine (ZANAFLEX) 4 MG tablet Take 4 mg by mouth every 6 (six) hours as needed for muscle spasms.    [provider]    Allergies Patient has no known allergies.  Family History  Problem Relation Age of Onset  . Breast cancer Sister   . Kidney disease Mother     Social History Social History   Tobacco Use  . Smoking status: Former Smoker    Packs/day: 1.00    Years: 48.00    Pack years: 48.00    Types: Cigarettes    Quit date: 2018    Years since quitting: 4.1  . Smokeless tobacco: Never Used  Vaping Use  . Vaping Use: Every day  Substance Use Topics  . Alcohol use: No  . Drug use: No    Review of Systems  Review of Systems  Constitutional: Positive for fatigue. Negative for fever.  HENT: Negative for congestion and sore throat.   Eyes: Negative for visual disturbance.  Respiratory: Negative for cough and shortness of breath.   Cardiovascular: Negative for chest pain.  Gastrointestinal: Negative for abdominal pain, diarrhea, nausea and vomiting.  Genitourinary: Negative for flank pain.  Musculoskeletal: Positive for arthralgias. Negative for back pain and neck pain.  Skin: Positive for rash. Negative for wound.  Neurological: Negative for weakness.  All other systems reviewed and are negative.    ____________________________________________  PHYSICAL EXAM:      VITAL SIGNS: ED Triage Vitals  Enc Vitals Group     BP 05/03/20 1634 (!) 158/86     Pulse Rate 05/03/20 1634 83     Resp 05/03/20 1634 16     Temp 05/03/20 1634 98.6 F (37 C)     Temp Source 05/03/20 1634 Oral     SpO2 05/03/20 1634 91 %     Weight 05/03/20 1635 275 lb (124.7 kg)     Height 05/03/20 1635 5\' 8"  (1.727 m)     Head Circumference --      Peak Flow --      Pain Score 05/03/20 1635 0     Pain Loc --      Pain Edu? --      Excl. in [place]? --       Physical Exam Vitals and nursing note reviewed.  Constitutional:      General: She is not in acute distress.    Appearance: She is well-developed.  HENT:     Head: Normocephalic and atraumatic.  Eyes:     Conjunctiva/sclera: Conjunctivae normal.  Cardiovascular:     Rate and Rhythm: Normal rate and regular rhythm.     Heart sounds: Normal heart sounds. No murmur heard. No friction rub.  Pulmonary:     Effort: Pulmonary effort is normal. No respiratory distress.  Breath sounds: Normal breath sounds. No wheezing or rales.  Abdominal:     General: There is no distension.     Palpations: Abdomen is soft.     Tenderness: There is no abdominal tenderness.  Musculoskeletal:     Cervical back: Neck supple.  Skin:    General: Skin is warm.     Capillary Refill: Capillary refill takes less than 2 seconds.  Neurological:     Mental Status: She is alert and oriented to person, place, and time.     Motor: No abnormal muscle tone.      UPPER EXTREMITY EXAM: Right  INSPECTION & PALPATION: Moderate erythema along the dorsum of the right hand with marked induration overlying the index finger MTP.  No expressible purulence.  No overt fluctuance.  There is erythema and warmth extending to the wrist.  SENSORY: Sensation is intact to light touch in:  Superficial radial nerve distribution (dorsal first web space) Median nerve distribution (tip of index finger)   Ulnar nerve distribution (tip of small finger)     MOTOR:  + Motor posterior interosseous nerve (thumb IP extension) + Anterior interosseous nerve (thumb IP flexion, index finger DIP flexion) + Radial nerve (wrist extension) + Median nerve (palpable firing thenar mass) + Ulnar nerve (palpable firing of first dorsal interosseous muscle)  VASCULAR: 2+ radial pulse Brisk capillary refill < 2 sec, fingers warm and well-perfused  ____________________________________________   LABS (all labs ordered are listed, but only  abnormal results are displayed)  Labs Reviewed  COMPREHENSIVE METABOLIC PANEL - Abnormal; Notable for the following components:      Result Value   Sodium 133 (*)    Chloride 89 (*)    CO2 35 (*)    Glucose, Bld 141 (*)    Total Protein 6.2 (*)    Albumin 3.3 (*)    ALT 61 (*)    All other components within normal limits  CBC WITH DIFFERENTIAL/PLATELET - Abnormal; Notable for the following components:   WBC 15.0 (*)    RBC 6.17 (*)    Hemoglobin 19.9 (*)    HCT 57.4 (*)    Neutro Abs 11.6 (*)    Abs Immature Granulocytes 0.08 (*)    All other components within normal limits  RESP PANEL BY RT-PCR (FLU A&B, COVID) ARPGX2  CULTURE, BLOOD (ROUTINE X 2)  CULTURE, BLOOD (ROUTINE X 2)  LACTIC ACID, PLASMA  URIC ACID  SEDIMENTATION RATE  URINALYSIS, COMPLETE (UACMP) WITH MICROSCOPIC  C-REACTIVE PROTEIN  HIV ANTIBODY (ROUTINE TESTING W REFLEX)  BASIC METABOLIC PANEL  CBC  PATHOLOGIST SMEAR REVIEW  HEMOGLOBIN A1C    ____________________________________________  EKG:  ________________________________________  RADIOLOGY All imaging, including plain films, CT scans, and ultrasounds, independently reviewed by me, and interpretations confirmed via formal radiology reads.  ED MD interpretation:   X-ray hand: Negative  Official radiology report(s): DG Hand Complete Right  Result Date: 05/03/2020 CLINICAL DATA:  65 year old female with concern for osteomyelitis. EXAM: RIGHT HAND - COMPLETE 3+ VIEW COMPARISON:  None. FINDINGS: There is no acute fracture or dislocation. No bone erosion or periosteal elevation to suggest acute osteomyelitis. Mild arthritic changes of the DIP joints. The soft tissues are unremarkable. No radiopaque foreign object or soft tissue gas. IMPRESSION: Negative. Electronically Signed   By: Anner Crete M.D.   On: 05/03/2020 18:28    ____________________________________________  PROCEDURES   Procedure(s) performed (including Critical Care):  .1-3  Lead EKG Interpretation Performed by: Duffy Bruce, MD Authorized by: Ellender Hose,  Lysbeth Galas, MD     Interpretation: normal     ECG rate:  80-90   ECG rate assessment: normal     Rhythm: sinus rhythm     Ectopy: none     Conduction: normal   Comments:     Indication: sepsis    ____________________________________________  INITIAL IMPRESSION / MDM / ASSESSMENT AND PLAN / ED COURSE  As part of my medical decision making, I reviewed the following data within the [facility] notes reviewed and incorporated, Old chart reviewed, Notes from prior ED visits, and [place] Controlled Substance Database       *Sarah Norton was evaluated in Emergency Department on 05/03/2020 for the symptoms described in the history of present illness. She was evaluated in the context of the global COVID-19 pandemic, which necessitated consideration that the patient might be at risk for infection with the SARS-CoV-2 virus that causes COVID-19. Institutional protocols and algorithms that pertain to the evaluation of patients at risk for COVID-19 are in a state of rapid change based on information released by regulatory bodies including the CDC and federal and state organizations. These policies and algorithms were followed during the patient's care in the ED.  Some ED evaluations and interventions may be delayed as a result of limited staffing during the pandemic.*     Medical Decision Making: 65 year old female here with ongoing right hand pain and redness despite outpatient antibiotics.  This is been a recurrent issue.  She has a significant leukocytosis here and exam concerning for ongoing cellulitis.  Imaging initially negative.  Lactic acid negative.  While the exam is not necessarily concerning, she has persistent leukocytosis, pain, redness, despite outpatient antibiotics in a diabetic patient on her dominant hand.  Feels reasonable to admit her for observation, antibiotics, and MR for possible  underlying deep infection, osteomyelitis, or possible joint involvement.  ____________________________________________  FINAL CLINICAL IMPRESSION(S) / ED DIAGNOSES  Final diagnoses:  Cellulitis of right hand  Other elevated white blood cell (WBC) count     MEDICATIONS GIVEN DURING THIS VISIT:  Medications  levothyroxine (SYNTHROID) tablet 200 mcg (has no administration in time range)  mometasone-formoterol (DULERA) 200-5 MCG/ACT inhaler 2 puff (has no administration in time range)  montelukast (SINGULAIR) tablet 10 mg (has no administration in time range)  Tiotropium Bromide Monohydrate AERS 2 puff (has no administration in time range)  heparin injection 5,000 Units (has no administration in time range)  sodium chloride flush (NS) 0.9 % injection 3 mL (has no administration in time range)  acetaminophen (TYLENOL) tablet 650 mg (has no administration in time range)    Or  acetaminophen (TYLENOL) suppository 650 mg (has no administration in time range)  polyethylene glycol (MIRALAX / GLYCOLAX) packet 17 g (has no administration in time range)  0.9 %  sodium chloride infusion (has no administration in time range)  insulin glargine (LANTUS) injection 35 Units (has no administration in time range)  insulin aspart (novoLOG) injection 0-15 Units (has no administration in time range)  albuterol (VENTOLIN HFA) 108 (90 Base) MCG/ACT inhaler 1-2 puff (has no administration in time range)  gadobutrol (GADAVIST) 1 MMOL/ML injection 10 mL (has no administration in time range)  sodium chloride 0.9 % bolus 1,000 mL (0 mLs Intravenous Stopped 05/03/20 2252)  vancomycin (VANCOCIN) IVPB 1000 mg/200 mL premix (0 mg Intravenous Stopped 05/03/20 2252)  LORazepam (ATIVAN) injection 0.5 mg (0.5 mg Intravenous Given 05/03/20 2302)     ED Discharge Orders    None  Note:  This document was prepared using Dragon voice recognition software and may include unintentional dictation errors.   Duffy Bruce, MD 05/03/20 [phone]

## 2020-05-03 NOTE — Telephone Encounter (Signed)
[facility] provided number and received recording that number is not in service.  Called home number on file and was told by an unknown female that I had the wrong number.

## 2020-05-03 NOTE — H&P (Signed)
History and Physical   Sarah Norton WLN:[id number] DOB: 05/10/1955 DOA: 05/03/2020  PCP: Donnie Coffin, MD   Patient coming from: Home, sent by PCP  Chief Complaint: Right hand cellulitis  HPI: Sarah Norton is a 65 y.o. female with medical history significant of atypical parkinsonism, diabetes, hypertension, lymphedema, hypothyroidism, tremor, COPD presents with recurrent right hand cellulitis.  Patient has had recurrent cellulitis of the right hand for 4-5 months. She states that it is currently looking better than it had in the past but still has not completely resolved.  She has been sent in by her PCP for concern for deeper infection after failing treatment with clindamycin for 7 days outpatient recently.  Patient reports previously having pain at her right first MCP is worse with palpation but denies pain at this time.  She reports some chronic shortness of breath but states she saw her pulmonologist about this recently.  She denies fevers, chills, cough, chest pain, constipation, diarrhea, nausea, vomiting.  ED Course: Vital signs in the ED were stable.  Lab work-up showed CMP with sodium 133, chloride 89, bicarb 35, glucose 141, protein 6.2, albumin 3.3.  CBC showed leukocytosis to 15 and elevated hemoglobin to 19.9.  Uric acid and lactic acid were normal.  ESR and CRP are pending.  Respiratory referral and Covid pending.  Urinalysis and blood cultures are also pending.  Patient started on vancomycin for purulent cellulitis.  Review of Systems: As per HPI otherwise all other systems reviewed and are negative.  Past Medical History:  Diagnosis Date  . Arthritis   . Asthma   . COPD (chronic obstructive pulmonary disease) ([place])   . DDD (degenerative disc disease), lumbar   . Diabetes mellitus without complication ([place])   . Hernia of abdominal wall   . Hyperlipidemia   . Hypertension   . Hypothyroidism    hashimotos throiditis  -thyroidectomy  2000  . Neuromuscular disorder  ([place])    parkinsons dz-per past diagnosis    Past Surgical History:  Procedure Laterality Date  . ABDOMINAL HYSTERECTOMY    . CHOLECYSTECTOMY    . COLONOSCOPY WITH PROPOFOL N/A 09/08/2014   Procedure: COLONOSCOPY WITH PROPOFOL;  Surgeon: Lucilla Lame, MD;  Location: [facility] ENDOSCOPY;  Service: Endoscopy;  Laterality: N/A;  . HIATAL HERNIA REPAIR    . parathyroidectomy  2000  . THYROIDECTOMY    . TONSILLECTOMY    . TUMOR REMOVAL Right Leg    Social History  reports that she quit smoking about 4 years ago. Her smoking use included cigarettes. She has a 48.00 pack-year smoking history. She has never used smokeless tobacco. She reports that she does not drink alcohol and does not use drugs.  No Known Allergies  Family History  Problem Relation Age of Onset  . Breast cancer Sister   . Kidney disease Mother   Reviewed on mission  Prior to Admission medications   Medication Sig Start Date End Date Taking? Authorizing Provider  atorvastatin (LIPITOR) 40 MG tablet Take 40 mg by mouth daily.    [provider]  chlorhexidine (HIBICLENS) 4 % external liquid Apply topically daily as needed.    [provider]  chlorhexidine (PERIDEX) 0.12 % solution Use as directed 15 mLs in the mouth or throat 2 (two) times daily.    [provider]  fluticasone-salmeterol (ADVAIR HFA) 115-21 MCG/ACT inhaler Inhale 2 puffs into the lungs 2 (two) times daily. 05/02/20   Flora Lipps, MD  indomethacin (INDOCIN) 25 MG capsule Take  25 mg by mouth 2 (two) times daily with a meal.    [provider]  levothyroxine (SYNTHROID) 200 MCG tablet Take 200 mcg by mouth daily before breakfast.    [provider]  montelukast (SINGULAIR) 10 MG tablet Take 10 mg by mouth at bedtime.    [provider]  penicillin v potassium (VEETID) 250 MG tablet Take 250 mg by mouth 4 (four) times daily.    [provider]  promethazine (PHENERGAN) 25 MG tablet Take 25 mg by  mouth every 6 (six) hours as needed for nausea or vomiting.    [provider]  tiotropium (SPIRIVA) 18 MCG inhalation capsule Place 18 mcg into inhaler and inhale daily.    [provider]  Tiotropium Bromide Monohydrate (SPIRIVA RESPIMAT) 1.25 MCG/ACT AERS Inhale 2 puffs into the lungs 1 day or 1 dose for 1 dose. 05/02/20 05/03/20  Flora Lipps, MD  tiZANidine (ZANAFLEX) 4 MG tablet Take 4 mg by mouth every 6 (six) hours as needed for muscle spasms.    [provider]    Physical Exam: Vitals:   05/03/20 1634 05/03/20 1635 05/03/20 2130 05/03/20 2200  BP: (!) 158/86  (!) 144/97 (!) 148/89  Pulse: 83  83 80  Resp: 16  16   Temp: 98.6 F (37 C)     TempSrc: Oral     SpO2: 91%  93% 97%  Weight:  124.7 kg    Height:  '5\' 8"'  (1.727 m)     Physical Exam Constitutional:      General: She is not in acute distress.    Appearance: Normal appearance.  HENT:     Head: Normocephalic and atraumatic.     Mouth/Throat:     Mouth: Mucous membranes are moist.     Pharynx: Oropharynx is clear.  Eyes:     Extraocular Movements: Extraocular movements intact.     Pupils: Pupils are equal, round, and reactive to light.  Cardiovascular:     Rate and Rhythm: Normal rate and regular rhythm.     Pulses: Normal pulses.     Heart sounds: Normal heart sounds.  Pulmonary:     Effort: Pulmonary effort is normal. No respiratory distress.     Breath sounds: Rhonchi present.  Abdominal:     General: Bowel sounds are normal. There is no distension.     Palpations: Abdomen is soft.     Tenderness: There is no abdominal tenderness.  Musculoskeletal:        General: No swelling or deformity.  Skin:    General: Skin is warm and dry.     Comments: Right first MCP with redness and swelling.  Neurological:     General: No focal deficit present.     Mental Status: Mental status is at baseline.    Labs on Admission: I have personally reviewed following labs and imaging  studies  CBC: Recent Labs  Lab 05/03/20 1635  WBC 15.0*  NEUTROABS 11.6*  HGB 19.9*  HCT 57.4*  MCV 93.0  PLT 888    Basic Metabolic Panel: Recent Labs  Lab 05/03/20 1635  NA 133*  K 4.1  CL 89*  CO2 35*  GLUCOSE 141*  BUN 16  CREATININE 0.75  CALCIUM 9.5    GFR: Estimated Creatinine Clearance: 96.3 mL/min (by C-G formula based on SCr of 0.75 mg/dL).  Liver Function Tests: Recent Labs  Lab 05/03/20 1635  AST 27  ALT 61*  ALKPHOS 101  BILITOT 0.9  PROT  6.2*  ALBUMIN 3.3*    Urine analysis:    Component Value Date/Time   COLORURINE Yellow 11/16/2013 1900   APPEARANCEUR Clear 11/16/2013 1900   LABSPEC 1.014 11/16/2013 1900   PHURINE 6.0 11/16/2013 1900   GLUCOSEU 150 mg/dL 11/16/2013 1900   HGBUR Negative 11/16/2013 1900   BILIRUBINUR Negative 11/16/2013 1900   KETONESUR Negative 11/16/2013 1900   PROTEINUR Negative 11/16/2013 1900   NITRITE Negative 11/16/2013 1900   LEUKOCYTESUR Negative 11/16/2013 1900    Radiological Exams on Admission: DG Hand Complete Right  Result Date: 05/03/2020 CLINICAL DATA:  65 year old female with concern for osteomyelitis. EXAM: RIGHT HAND - COMPLETE 3+ VIEW COMPARISON:  None. FINDINGS: There is no acute fracture or dislocation. No bone erosion or periosteal elevation to suggest acute osteomyelitis. Mild arthritic changes of the DIP joints. The soft tissues are unremarkable. No radiopaque foreign object or soft tissue gas. IMPRESSION: Negative. Electronically Signed   By: Anner Crete M.D.   On: 05/03/2020 18:28    EKG: Not obtained in ED.  Assessment/Plan Active Problems:   Atypical parkinsonism (HCC)   Diabetes mellitus type 2, uncomplicated (HCC)   Hypertension   Thyroid disease   Lymphedema   Cellulitis   COPD (chronic obstructive pulmonary disease) (HCC)   Polycythemia  Cellulitis > Patient with recurrent cellulitis for several months now having failed 7 days of clindamycin outpatient. > Sent in by  PCP for IV antibiotics and further imaging to evaluate for deeper infection. > Started on vancomycin in the ED and hand imaging showed x-ray without acute changes.  MRI has been ordered but not yet performed. > Leukocytosis to 15 in the ED, afebrile. - Place in [place] - Continue vancomycin - Follow-up an MRI - Follow-up ESR and CRP -Trend fever curve and white count  Polycythemia > Hemoglobin noted to be elevated to 19.9 in ED, will give IV fluids overnight and recheck in the morning. - We will add on pathology smear review. - Will need further work-up if this is not related to hemoconcentration  Diabetes > Reports taking about 60 units twice daily at home, glucose 141 in ED - 35 units twice daily with SSI  Hypertension > No home medications listed, BP in the 010U systolic in ED - We will see if med reconciliation confirms this  Lymphedema - Continue compression stockings  Hypothyroidism - Continue home Synthroid  COPD > Home Advair replaced with formulary Dulera - Continue home to treat her p.m. and Singulair - As needed albuterol  Atypical parkinsonism > Listed in her chart, not on any medications for this   DVT prophylaxis: Heparin for now, pending results of MRI if any indication for surgery can switch to SCDs.   Code Status:   Partial, no ACLS, intubation okay Family Communication:  None on admission Disposition Plan:   Patient is from:  Home  Anticipated DC to:  Home  Anticipated DC date:  1 to 5 days  Anticipated DC barriers: None  Consults called:  None  Admission status:  Observation, MedSurg   Severity of Illness: The appropriate patient status for this patient is OBSERVATION. Observation status is judged to be reasonable and necessary in order to provide the required intensity of service to ensure the patient's safety. The patient's presenting symptoms, physical exam findings, and initial radiographic and laboratory data in the context of their medical  condition is felt to place them at decreased risk for further clinical deterioration. Furthermore, it is anticipated that the patient will be  medically stable for discharge from the hospital within 2 midnights of admission. The following factors support the patient status of observation.   " The patient's presenting symptoms include right hand pain, swelling, redness. " The physical exam findings include right hand pain, swelling, redness. " The initial radiographic and laboratory data are concerning for leukocytosis, elevated hemoglobin, mild hyponatremia, elevated bicarb.Marcelyn Bruins MD [facility]  How to contact the [facility] Attending or Consulting provider [place] or covering provider during after hours [place], for this patient?   1. Check the care team in [facility] and look for a) attending/consulting TR[facility] provider listed and b) the [facility] team listed 2. Log into [email or web address] and use [place]'s universal password to access. If you do not have the password, please contact the hospital operator. 3. Locate the [facility] provider you are looking for under [facility] and page to a number that you can be directly reached. 4. If you still have difficulty reaching the provider, please page the [facility] (Director on Call) for the Hospitalists listed on amion for assistance.  05/03/2020, 10:59 PM

## 2020-05-04 DIAGNOSIS — L03113 Cellulitis of right upper limb: Secondary | ICD-10-CM | POA: Diagnosis not present

## 2020-05-04 DIAGNOSIS — L03011 Cellulitis of right finger: Secondary | ICD-10-CM

## 2020-05-04 LAB — GLUCOSE, CAPILLARY: Glucose-Capillary: 126 mg/dL — ABNORMAL HIGH (ref 70–99)

## 2020-05-04 LAB — HEMOGLOBIN A1C
Hgb A1c MFr Bld: 8 % — ABNORMAL HIGH (ref 4.8–5.6)
Mean Plasma Glucose: 182.9 mg/dL

## 2020-05-04 LAB — CBC
HCT: 57.4 % — ABNORMAL HIGH (ref 36.0–46.0)
Hemoglobin: 19.4 g/dL — ABNORMAL HIGH (ref 12.0–15.0)
MCH: 32.1 pg (ref 26.0–34.0)
MCHC: 33.8 g/dL (ref 30.0–36.0)
MCV: 94.9 fL (ref 80.0–100.0)
Platelets: 250 10*3/uL (ref 150–400)
RBC: 6.05 MIL/uL — ABNORMAL HIGH (ref 3.87–5.11)
RDW: 14.6 % (ref 11.5–15.5)
WBC: 15.3 10*3/uL — ABNORMAL HIGH (ref 4.0–10.5)
nRBC: 0 % (ref 0.0–0.2)

## 2020-05-04 LAB — BASIC METABOLIC PANEL
Anion gap: 10 (ref 5–15)
BUN: 17 mg/dL (ref 8–23)
CO2: 35 mmol/L — ABNORMAL HIGH (ref 22–32)
Calcium: 8.9 mg/dL (ref 8.9–10.3)
Chloride: 91 mmol/L — ABNORMAL LOW (ref 98–111)
Creatinine, Ser: 0.72 mg/dL (ref 0.44–1.00)
GFR, Estimated: >60 mL/min (ref >60)
Glucose, Bld: 59 mg/dL — ABNORMAL LOW (ref 70–99)
Potassium: 3.7 mmol/L (ref 3.5–5.1)
Sodium: 136 mmol/L (ref 135–145)

## 2020-05-04 LAB — PATHOLOGIST SMEAR REVIEW

## 2020-05-04 LAB — HIV ANTIBODY (ROUTINE TESTING W REFLEX): HIV Screen 4th Generation wRfx: NONREACTIVE

## 2020-05-04 LAB — C-REACTIVE PROTEIN: CRP: 0.6 mg/dL (ref <1.0)

## 2020-05-04 MED ORDER — TIOTROPIUM BROMIDE MONOHYDRATE 18 MCG IN CAPS
18.0000 ug | ORAL_CAPSULE | Freq: Every day | RESPIRATORY_TRACT | Status: DC
  Filled 2020-05-04: qty 5

## 2020-05-04 MED ORDER — MOMETASONE FURO-FORMOTEROL FUM 200-5 MCG/ACT IN AERO
2.0000 | INHALATION_SPRAY | Freq: Two times a day (BID) | RESPIRATORY_TRACT | Status: DC
  Filled 2020-05-04: qty 8.8

## 2020-05-04 MED ORDER — MONTELUKAST SODIUM 10 MG PO TABS: 10.0000 mg | ORAL_TABLET | Freq: Every day | ORAL | Status: DC

## 2020-05-04 MED ORDER — MELATONIN 5 MG PO TABS: 5.0000 mg | ORAL_TABLET | Freq: Every day | ORAL | Status: DC

## 2020-05-04 MED ORDER — VANCOMYCIN HCL 1000 MG/200ML IV SOLN
1000.0000 mg | Freq: Two times a day (BID) | INTRAVENOUS | Status: DC
  Filled 2020-05-04 (×2): qty 200

## 2020-05-04 NOTE — Progress Notes (Signed)
Pharmacy Antibiotic Note  Sarah Norton is a 65 y.o. female admitted on 05/03/2020 with cellulitis.  Pharmacy has been consulted for Vancomycin dosing.  Pt given Vanc 1 gram 02/23 @ 2134  Plan: Vancomycin 1000 mg IV Q 12 hrs Goal AUC 400-550. Expected AUC: 484.7 SCr used: 0.75 Vd used: 0.5 (BMI 41.81)  Height: 5\' 8"  (172.7 cm) Weight: 124.7 kg (275 lb) IBW/kg (Calculated) : 63.9  Temp (24hrs), Avg:98.4 F (36.9 C), Min:98.1 F (36.7 C), Max:98.6 F (37 C)  Recent Labs  Lab 05/03/20 1635 05/03/20 1638  WBC 15.0*  --   CREATININE 0.75  --   LATICACIDVEN  --  1.4    Estimated Creatinine Clearance: 96.3 mL/min (by C-G formula based on SCr of 0.75 mg/dL).    No Known Allergies  Antimicrobials this admission: 2/23 Vancomycin >>   Microbiology results: 2/23 BCx: Pending  Thank you for allowing pharmacy to be a part of this patient's care.  Renda Rolls, PharmD, [facility] 05/04/2020 4:07 AM

## 2020-05-04 NOTE — Discharge Summary (Signed)
AMA discharge summary  Please note: Patient elected to leave [address] before I was able to evaluate her.  I received notification from bedside RN at 720 this morning stating that the patient wished to leave [place].  I informed the bedside nurse that I was not yet familiar with the patient however she was insistent upon leaving right away and understood that she was leaving [place] that she would be allowed to do so.  Per chart review patient was admitted for recurrent cellulitis of the right hand that had been worsening over the past 4 to 5 months.  Sent from PCPs office for concern for deeper soft tissue infection and failing outpatient treatment with p.o. clindamycin.  On admission the patient did undergo MRI of the affected extremity the demonstrated cellulitis with possible small abscess over the second metacarpal head.  Importantly no evidence of osteomyelitis.  The patient was started on broad-spectrum antibiotics however she unfortunately elected to leave [place] before I could evaluate her during rounds.  This patient is high risk for clinical deterioration and readmission.  Ralene Muskrat MD

## 2020-05-04 NOTE — Progress Notes (Signed)
During bedside report, pt states "I'm leaving, my daughter is on the way, I can't stay here!"    This nurse explained to pt that the doctor would be around shortly and she could discuss discharge plans with him.  Both nurses asked pt if there was anything we could do to help her or to make her more comfortable while waiting to speak with doctor.  Pt declines our offer and continues to state that she is leaving.  Pt a/o x 4; it was explained to pt and daughter that she would be leaving AMA; the pt verbalized understanding.    Both IV's were removed; pt assisted into wheelchair with personal belongings.    MD made aware of pt demanding to leave AMA.

## 2020-05-04 NOTE — Telephone Encounter (Signed)
Patient is currently admitted. Routing to Dr. Mortimer Fries as an Juluis Rainier.

## 2020-05-08 LAB — CULTURE, BLOOD (ROUTINE X 2)
Culture: NO GROWTH
Culture: NO GROWTH
Special Requests: ADEQUATE
Special Requests: ADEQUATE

## 2020-05-09 ENCOUNTER — Telehealth: Payer: Self-pay | Admitting: Internal Medicine

## 2020-05-09 DIAGNOSIS — J449 Chronic obstructive pulmonary disease, unspecified: Secondary | ICD-10-CM

## 2020-05-09 NOTE — Telephone Encounter (Signed)
Spoke to patient and relayed below recommendations.  Patient stated that she was recently discharged from the hospital and she will not be able to go back there.  She stated that during her admission, she was given oxygen but not discharged with oxygen. She also stated that she has been falling for 'a while now". Explained to patient that low oxygen levels could be causing the falls. Patient educated on importance of seeking emergent evaluation, however she declined. She would like ONO ordered.  Dr. Mortimer Fries, is it okay to order ONO?

## 2020-05-09 NOTE — Telephone Encounter (Signed)
Yes can do ONO, also needs 6MWT if not done

## 2020-05-09 NOTE — Telephone Encounter (Signed)
Spoke to patient and relayed below message.  ONO has been order as she is agreeable.  Patient stated that she can not walk for six minutes due to falling after walking 3-4 steps. Nothing further needed at this time.    Routing to Dr. Mortimer Fries as an Juluis Rainier.

## 2020-05-09 NOTE — Telephone Encounter (Signed)
Spoke to patient via telephone. Patient stated that Dr. Clide Deutscher recommended that patient contact Dr. Mortimer Fries and make him aware that patient's spo2 is running between 87-91% on roomair. Patient declined six minute walk and ONO at last OV.  Patient stated that she is unable to walk more then 3 steps before falling.  After discussion with her daughter, she would like to proceed with ONO.  She does not wear supplemental oxygen. She reports of increased sob and wheezing. No relief with ventolin and advair.   Dr. Mortimer Fries, please advise? Okay to order ONO?

## 2020-05-09 NOTE — Telephone Encounter (Signed)
Ok thank you 

## 2020-05-09 NOTE — Telephone Encounter (Signed)
She needs to go to urgent care or ER, I am concerned about well being and may go into cardiac arrest if she is falling

## 2020-05-11 ENCOUNTER — Telehealth: Payer: Self-pay | Admitting: Internal Medicine

## 2020-05-16 NOTE — Telephone Encounter (Signed)
Nothing noted in message. Will close encounter.  

## 2020-05-26 ENCOUNTER — Telehealth: Payer: Self-pay

## 2020-05-26 NOTE — Telephone Encounter (Signed)
Lm foe reminder of covid test prior to PFT.  [facility]/29/[facility]

## 2020-05-29 NOTE — Telephone Encounter (Signed)
Spoke to patient, who stated that she would need to reschedule PFT due to transportation issues.  She will call back to reschedule.

## 2020-05-29 NOTE — Telephone Encounter (Signed)
Lm x2 for patient.  Will close encounter per office protocol.   

## 2020-05-30 ENCOUNTER — Other Ambulatory Visit: Payer: Medicare Other

## 2020-05-31 ENCOUNTER — Ambulatory Visit: Payer: Medicare Other

## 2020-06-08 ENCOUNTER — Telehealth: Payer: Self-pay | Admitting: Internal Medicine

## 2020-06-08 NOTE — Telephone Encounter (Signed)
ONO reviewed by Dr. Jackie Plum 2L QHS. Lowest SPO2 59%.  Lm for patient.

## 2020-06-09 NOTE — Telephone Encounter (Signed)
Pt called back for

## 2020-06-09 NOTE — Telephone Encounter (Signed)
Lm x2 for patient. Will call once more due to nature of call.   

## 2020-06-12 NOTE — Telephone Encounter (Signed)
Lm for patient.  

## 2020-06-12 NOTE — Telephone Encounter (Signed)
Patient is aware of results and voiced her understanding. Patient currently wears nocturnal oxygen and will continue to do so.  Nothing further needed at this time.

## 2020-06-12 NOTE — Telephone Encounter (Signed)
Pt calling back

## 2020-07-28 ENCOUNTER — Other Ambulatory Visit: Payer: Self-pay | Admitting: Orthopedic Surgery

## 2020-08-10 ENCOUNTER — Telehealth: Payer: Self-pay | Admitting: Internal Medicine

## 2020-08-10 ENCOUNTER — Encounter
Admission: RE | Admit: 2020-08-10 | Discharge: 2020-08-10 | Disposition: A | Discharge status: Home / Self Care | Payer: Medicare Other | Source: Ambulatory Visit | Attending: Orthopedic Surgery | Admitting: Orthopedic Surgery

## 2020-08-10 HISTORY — DX: Dyspnea, unspecified: R06.00

## 2020-08-10 HISTORY — DX: Hypoxemia: R09.02

## 2020-08-10 HISTORY — DX: Unspecified cataract: H26.9

## 2020-08-10 HISTORY — DX: Other complications of anesthesia, initial encounter: T88.59XA

## 2020-08-10 HISTORY — DX: Atherosclerotic heart disease of native coronary artery without angina pectoris: I25.10

## 2020-08-10 HISTORY — DX: Tremor, unspecified: R25.1

## 2020-08-10 HISTORY — DX: Personal history of other diseases of the digestive system: Z87.19

## 2020-08-10 NOTE — Telephone Encounter (Signed)
Received surgical clearance form.  patient last seen 05/02/20. Appt is needed prior to clearance.  Lm for patient.

## 2020-08-10 NOTE — Patient Instructions (Addendum)
Your procedure is scheduled on:08-15-20 TUESDAY Report to the Registration Desk on the 1st floor of the [facility]-Then proceed to the 2nd floor Surgery Desk in the [facility] To find out your arrival time, please call [phone] between 1PM - 3PM on:08-14-20 MONDAY  REMEMBER: Instructions that are not followed completely may result in serious medical risk, up to and including death; or upon the discretion of your surgeon and anesthesiologist your surgery may need to be rescheduled.  Do not eat food after midnight the night before surgery.  No gum chewing, lozengers or hard candies.  You may however, drink WATER up to 2 hours before you are scheduled to arrive for your surgery. Do not drink anything within 2 hours of your scheduled arrival time.  Type 1 and Type 2 diabetics should only drink water.  TAKE THESE MEDICATIONS THE MORNING OF SURGERY WITH A SIP OF WATER: -ALLOPURINOL (ZYLOPRIM) -NORVASC (AMLODIPINE) -LIPITOR (ATORVASTATIN) -GABAPENTIN (NEURONTIN) -SYNTHROID (LEVOTHYROXINE) -SINGULAIR (MONTELUKAST)  USE ALL YOUR INHALERS THE DAY OF SURGERY INCLUDING YOUR ALBUTEROL INHALER AND BRING ALBUTEROL INHALER TO HOSPITAL  DO NOT TAKE ANY INSULIN THE MORNING OF SURGERY  One week prior to surgery: Stop Anti-inflammatories (NSAIDS) such as INDOMETHACIN (INDOCIN), Advil, Aleve, Ibuprofen, Motrin, Naproxen, Naprosyn and Aspirin based products such as Excedrin, Goodys Powder, BC Powder.You may however, continue to take Tylenol if needed for pain up until the day of surgery.  Stop ANY OVER THE COUNTER supplements/vitamins NOW (08-10-20) until after surgery (VALERIAN ROOT AND MELATONIN)  No Alcohol for 24 hours before or after surgery.  No Smoking including e-cigarettes for 24 hours prior to surgery.  No chewable tobacco products for at least 6 hours prior to surgery.  No nicotine patches on the day of surgery.  Do not use any "recreational" drugs for at least a week prior to your  surgery.  Please be advised that the combination of cocaine and anesthesia may have negative outcomes, up to and including death. If you test positive for cocaine, your surgery will be cancelled.  On the morning of surgery brush your teeth with toothpaste and water, you may rinse your mouth with mouthwash if you wish. Do not swallow any toothpaste or mouthwash.  Do not wear jewelry, make-up, hairpins, clips or nail polish.  Do not wear lotions, powders, or perfumes.   Do not shave body from the neck down 48 hours prior to surgery just in case you cut yourself which could leave a site for infection.  Also, freshly shaved skin may become irritated if using the CHG soap.  Contact lenses, hearing aids and dentures may not be worn into surgery.  Do not bring valuables to the hospital. [facility] is not responsible for any missing/lost belongings or valuables.   Use CHG Soap as directed on instruction sheet.  Notify your doctor if there is any change in your medical condition (cold, fever, infection).  Wear comfortable clothing (specific to your surgery type) to the hospital.  Plan for stool softeners for home use; pain medications have a tendency to cause constipation. You can also help prevent constipation by eating foods high in fiber such as fruits and vegetables and drinking plenty of fluids as your diet allows.  After surgery, you can help prevent lung complications by doing breathing exercises.  Take deep breaths and cough every 1-2 hours. Your doctor may order a device called an Incentive Spirometer to help you take deep breaths. When coughing or sneezing, hold a pillow firmly against your incision  with both hands. This is called "splinting." Doing this helps protect your incision. It also decreases belly discomfort.  If you are being admitted to the hospital overnight, leave your suitcase in the car. After surgery it may be brought to your room.  If you are being discharged the  day of surgery, you will not be allowed to drive home. You will need a responsible adult (18 years or older) to drive you home and stay with you that night.   If you are taking public transportation, you will need to have a responsible adult (18 years or older) with you. Please confirm with your physician that it is acceptable to use public transportation.   Please call the [place] Dept. at [phone] if you have any questions about these instructions.  Surgery Visitation Policy:  Patients undergoing a surgery or procedure may have one family member or support person with them as long as that person is not COVID-19 positive or experiencing its symptoms.  That person may remain in the waiting area during the procedure.  Inpatient Visitation:    Visiting hours are 7 a.m. to 8 p.m. Inpatients will be allowed two visitors daily. The visitors may change each day during the patient's stay. No visitors under the age of 57. Any visitor under the age of 53 must be accompanied by an adult. The visitor must pass COVID-19 screenings, use hand sanitizer when entering and exiting the patient's room and wear a mask at all times, including in the patient's room. Patients must also wear a mask when staff or their visitor are in the room. Masking is required regardless of vaccination status

## 2020-08-10 NOTE — Pre-Procedure Instructions (Signed)
Flora Lipps, MD  Physician  Critical Care  Progress Notes     Signed  Encounter Date:  05/02/2020           Signed          Name: Sarah Norton MRN:   [id number] DOB:   Dec 30, 1955            CONSULTATION DATE: 05/02/2020 REFERRING MD : Clide Deutscher  CHIEF COMPLAINT: SOB  STUDIES:      CT chest Independently reviewed by Me scan on 08/2016 No acute issues no pneumonia No masses seen  HISTORY OF PRESENT ILLNESS: 65 year old white female seen today for progressive shortness of breath and dyspnea exertion Patient with a longtime smoking history of 50 years Based on signs and symptoms and smoking history patient has a diagnosis of COPD At this time patient has a recent COPD exacerbation and completed antibiotics and prednisone Patient states she feels better however still fatigued shortness of breath and dyspnea exertion  Patient is a former smoker 1 pack/day for 50 years however has now reverted to vaping  I have explained to patient that she will need to stop vaping and stop smoking and avoid secondhand smoke she will also need several tests to assess her lung function  Based on her longstanding smoking history and her age she meets criteria for lung cancer screening referral program which was explained to the patient      Smoking Assessment and Cessation Counseling Upon further questioning, Patient smokes 1 ppd I have advised patient to quit/stop smoking as soon as possible due to high risk for multiple medical problems  Patient is willing to quit smoking   I have advised patient that we can assist and have options of Nicotine replacement therapy. I also advised patient on behavioral therapy and can provide oral medication therapy in conjunction with the other therapies Follow up next Office visit  for assessment of smoking cessation Smoking cessation counseling advised for 4 minutes     PAST MEDICAL HISTORY :   has a past medical history of  Arthritis, Asthma, COPD (chronic obstructive pulmonary disease) ([place]), DDD (degenerative disc disease), lumbar, Diabetes mellitus without complication ([place]), Hernia of abdominal wall, Hyperlipidemia, Hypertension, Hypothyroidism, and Neuromuscular disorder ([place]).  has a past surgical history that includes Thyroidectomy; Abdominal hysterectomy; Cholecystectomy; Tonsillectomy; Hiatal hernia repair; Tumor removal (Right, Leg); Colonoscopy with propofol (N/A, 09/08/2014); and parathyroidectomy (2000).        Prior to Admission medications   Medication Sig Start Date End Date Taking? Authorizing Provider  atorvastatin (LIPITOR) 40 MG tablet Take 40 mg by mouth daily.   Yes [provider]  chlorhexidine (HIBICLENS) 4 % external liquid Apply topically daily as needed.   Yes [provider]  chlorhexidine (PERIDEX) 0.12 % solution Use as directed 15 mLs in the mouth or throat 2 (two) times daily.   Yes [provider]  indomethacin (INDOCIN) 25 MG capsule Take 25 mg by mouth 2 (two) times daily with a meal.   Yes [provider]  levothyroxine (SYNTHROID) 200 MCG tablet Take 200 mcg by mouth daily before breakfast.   Yes [provider]  montelukast (SINGULAIR) 10 MG tablet Take 10 mg by mouth at bedtime.   Yes [provider]  penicillin v potassium (VEETID) 250 MG tablet Take 250 mg by mouth 4 (four) times daily.   Yes [provider]  promethazine (PHENERGAN) 25 MG tablet Take 25 mg by mouth every 6 (six) hours as needed for  nausea or vomiting.   Yes [provider]  tiotropium (SPIRIVA) 18 MCG inhalation capsule Place 18 mcg into inhaler and inhale daily.   Yes [provider]  tiZANidine (ZANAFLEX) 4 MG tablet Take 4 mg by mouth every 6 (six) hours as needed for muscle spasms.   Yes [provider]   No Known Allergies  FAMILY HISTORY:  family history includes Breast cancer in her sister;  Kidney disease in her mother. SOCIAL HISTORY:  reports that she quit smoking about 4 years ago. Her smoking use included cigarettes. She has a 48.00 pack-year smoking history. She has never used smokeless tobacco. She reports that she does not drink alcohol and does not use drugs.    Review of Systems:  Gen:  Denies  fever, sweats, chills weigh loss  HEENT: Denies blurred vision, double vision, ear pain, eye pain, hearing loss, nose bleeds, sore throat Cardiac:  No dizziness, chest pain or heaviness, chest tightness,edema, No JVD Resp:   + cough, -sputum production, +shortness of breath,-wheezing, -hemoptysis,  Gi: Denies swallowing difficulty, stomach pain, nausea or vomiting, diarrhea, constipation, bowel incontinence Gu:  Denies bladder incontinence, burning urine Ext:   Denies Joint pain, stiffness or swelling Skin: Denies  skin rash, easy bruising or bleeding or hives Endoc:  Denies polyuria, polydipsia , polyphagia or weight change Psych:   Denies depression, insomnia or hallucinations  Other:  All other systems negative   BP 132/78 (BP Location: Left Arm, Cuff Size: Large)   Pulse 89   Temp 97.8 F (36.6 C) (Temporal)   Ht 5\' 8"  (1.727 m)   Wt 275 lb (124.7 kg) Comment: weight is per patient  SpO2 92%   BMI 41.81 kg/m      Physical Examination:   GENERAL:NAD, no fevers, chills, + weakness + fatigue HEAD: Normocephalic, atraumatic.  EYES: PERLA, EOMI No scleral icterus.  MOUTH: Moist mucosal membrane.  EAR, NOSE, THROAT: Clear without exudates. No external lesions.  NECK: Supple.  PULMONARY: CTA B/L no wheezing, rhonchi, crackles CARDIOVASCULAR: S1 and S2. Regular rate and rhythm. No murmurs GASTROINTESTINAL: Soft, nontender, nondistended. Positive bowel sounds.  MUSCULOSKELETAL: No swelling, clubbing, or edema.  NEUROLOGIC: No gross focal neurological deficits. 5/5 strength all extremities SKIN: No ulceration, lesions, rashes, or cyanosis.   PSYCHIATRIC: Insight, judgment intact. -depression -anxiety ALL OTHER ROS ARE NEGATIVE   MEDICATIONS: I have reviewed all medications and confirmed regimen as documented       ASSESSMENT AND PLAN SYNOPSIS  65 year old morbidly obese white female seen today for assessment for COPD based on her longstanding smoking history with extensive smoking history and currently vaping with a recent bout of COPD exacerbation  Shortness of breath and dyspnea exertion related to her COPD Severe deconditioned state Morbid obesity Poor respiratory insufficiency   COPD Obtain pulmonary function testing to assess lung function Patient has refused 6-minute walk test to assess for exertional hypoxia Patient also refusing overnight pulse oximetry Patient was explained patient needs testing to assess for oxygen and she may be at a high risk for increased shortness of breath and heart problems and death Patient refuses to walk for 6-minute walk test and refuses overnight pulse oximetry Will start Spiriva Respimat 1.25 We will start Advair HFA Albuterol as needed No indication for prednisone or antibiotics at this time  Smoking cessation strongly advised   Extensive smoking history Patient to be referred to lung cancer screening protocol  Overall prognosis is poor patient with morbid obesity with severe deconditioned state in  the setting of longstanding smoking history Patient has very poor respiratory insufficiency   COVID-19 EDUCATION: The signs and symptoms of COVID-19 were discussed with the patient and how to seek care for testing (follow up with PCP or arrange E-visit).  The importance of social distancing was discussed today.  MEDICATION ADJUSTMENTS/LABS AND TESTS ORDERED: Obtain pulmonary function testing Obtain overnight pulse oximetry  Patient refusing to walk at this time to get 6-minute walk test   Start Spiriva Respimat 1.25 Start Advair HFA CURRENT  MEDICATIONS REVIEWED AT LENGTH WITH PATIENT TODAY   Patient/Family are satisfied with Plan of action and management. All questions answered  Follow up 6 months  TOTAL TIME SPENT 46 mins  Kurian Patricia Pesa, M.D.  [facility]  Medical Director [place] Director [place] Department               Electronically signed by Flora Lipps, MD at 05/02/2020 4:57 PM  Office Visit on 05/02/2020      Office Visit on 05/02/2020         Detailed Report       Note shared with patient    Additional Documentation  Vitals:  BP 132/78 (BP Location: Left Arm, Cuff Size: Large)   Pulse 89   Temp 97.8 F (36.6 C) (Temporal)   Ht 5\' 8"  (1.727 m)   Wt 124.7 kg    SpO2 92%   BMI 41.81 kg/m   BSA 2.45 m       More Vitals

## 2020-08-11 ENCOUNTER — Encounter
Admission: RE | Admit: 2020-08-11 | Discharge: 2020-08-11 | Disposition: A | Discharge status: Home / Self Care | Payer: Medicare Other | Source: Ambulatory Visit | Attending: Orthopedic Surgery | Admitting: Orthopedic Surgery

## 2020-08-11 ENCOUNTER — Other Ambulatory Visit: Payer: Self-pay

## 2020-08-11 DIAGNOSIS — I10 Essential (primary) hypertension: Secondary | ICD-10-CM | POA: Diagnosis not present

## 2020-08-11 DIAGNOSIS — E119 Type 2 diabetes mellitus without complications: Secondary | ICD-10-CM | POA: Diagnosis not present

## 2020-08-11 DIAGNOSIS — Z01818 Encounter for other preprocedural examination: Secondary | ICD-10-CM | POA: Insufficient documentation

## 2020-08-11 LAB — CBC
HCT: 44.6 % (ref 36.0–46.0)
Hemoglobin: 15.5 g/dL — ABNORMAL HIGH (ref 12.0–15.0)
MCH: 32.1 pg (ref 26.0–34.0)
MCHC: 34.8 g/dL (ref 30.0–36.0)
MCV: 92.3 fL (ref 80.0–100.0)
Platelets: 288 10*3/uL (ref 150–400)
RBC: 4.83 MIL/uL (ref 3.87–5.11)
RDW: 13.2 % (ref 11.5–15.5)
WBC: 13.8 10*3/uL — ABNORMAL HIGH (ref 4.0–10.5)
nRBC: 0 % (ref 0.0–0.2)

## 2020-08-11 LAB — BASIC METABOLIC PANEL
Anion gap: 12 (ref 5–15)
BUN: 21 mg/dL (ref 8–23)
CO2: 32 mmol/L (ref 22–32)
Calcium: 10 mg/dL (ref 8.9–10.3)
Chloride: 87 mmol/L — ABNORMAL LOW (ref 98–111)
Creatinine, Ser: 1.01 mg/dL — ABNORMAL HIGH (ref 0.44–1.00)
GFR, Estimated: >60 mL/min (ref >60)
Glucose, Bld: 158 mg/dL — ABNORMAL HIGH (ref 70–99)
Potassium: 4 mmol/L (ref 3.5–5.1)
Sodium: 131 mmol/L — ABNORMAL LOW (ref 135–145)

## 2020-08-11 NOTE — Telephone Encounter (Signed)
Spoke to patient and relayed below message. Patient stated that she was under the impression that pulmonary clearance was not needed.  She would like to reach out to Dr. Rudene Christians office to see if clearance is needed. If needed, patient will call back to schedule OV.

## 2020-08-11 NOTE — Telephone Encounter (Signed)
Lmx2 for patient.  Will close encounter per office protocol. Letter has been mailed to address on file.  

## 2020-08-14 MED ORDER — CEFAZOLIN IN SODIUM CHLORIDE 3-0.9 GM/100ML-% IV SOLN
3.0000 g | INTRAVENOUS | Status: AC
  Administered 2020-08-15: 3 g via INTRAVENOUS
  Filled 2020-08-14: qty 100

## 2020-08-14 MED ORDER — ORAL CARE MOUTH RINSE: 15.0000 mL | Freq: Once | OROMUCOSAL | Status: AC

## 2020-08-14 MED ORDER — SODIUM CHLORIDE 0.9 % IV SOLN
INTRAVENOUS | Status: DC
Start: 2020-08-14 — End: 2020-08-15

## 2020-08-14 MED ORDER — CHLORHEXIDINE GLUCONATE 0.12 % MT SOLN: 15.0000 mL | Freq: Once | OROMUCOSAL | Status: AC

## 2020-08-14 MED ORDER — FAMOTIDINE 20 MG PO TABS: 20.0000 mg | ORAL_TABLET | Freq: Once | ORAL | Status: DC

## 2020-08-14 NOTE — Progress Notes (Addendum)
  Perioperative Services Pre-Admission/Anesthesia Testing    Date: 08/14/20  Name: Sarah Norton MRN:   [id number]  Re: PCCM clearance for upcoming surgery   Case: [id number] Date/Time: 08/15/20 1437   Procedure: Right index finger cyst removal (Right Wrist)   Anesthesia type: Choice   Pre-op diagnosis: Foreign body granuloma of soft tissue, not elsewhere classified, left hand M60.242   Location: [facility] OR ROOM 01 / [place] [facility]   Surgeons: Hessie Knows, MD    Patient is scheduled for the above procedure on 08/15/2020 with Dr. Hessie Knows.  Patient with advanced COPD and is currently oxygen dependent.  PAT team reached out to primary pulmonologist for clearance prior to patient undergoing anesthesia for her planned surgical intervention. PAT APP was out of the office on Friday and unable to follow up on clearance. Reached out personally to Dr. Mortimer Fries today (08/14/2020) and as advised as follows:    Impression and Plan:  Sarah Norton was submitted for PCCM clearance prior to her undergoing the planned anesthetic and procedural courses. As per above, patient cleared by pulmonary medicine with an overate MODERATE risk of developing perioperative cardiopulmonary complications.    Available labs, pertinent testing, and imaging results were personally reviewed by me. Based on clinical review performed today (08/14/20), barring any significant acute changes in the patient's overall condition, it is anticipated that she will be able to proceed with the planned surgical intervention. Any acute changes in clinical condition may necessitate her procedure being postponed and/or cancelled. Patient will meet with anesthesia team (MD and/or CRNA) on this day of her procedure for preoperative evaluation/assessment.   Pre-surgical instructions were reviewed with the patient during her PAT appointment and questions were fielded by PAT clinical staff. Patient was advised that if any questions or  concerns arise prior to her procedure then she should return a call to PAT and/or her surgeon's office to discuss.  Honor Loh, MSN, APRN, FNP-C, CEN [facility]  Peri-operative Services Nurse Practitioner Phone: [phone] 08/14/20 2:56 PM  NOTE: This note has been prepared using Dragon dictation software. Despite my best ability to proofread, there is always the potential that unintentional transcriptional errors may still occur from this process.

## 2020-08-15 ENCOUNTER — Ambulatory Visit
Admission: RE | Admit: 2020-08-15 | Discharge: 2020-08-15 | Disposition: A | Discharge status: Home / Self Care | Payer: Medicare Other | Attending: Orthopedic Surgery | Admitting: Orthopedic Surgery

## 2020-08-15 ENCOUNTER — Encounter
Admission: RE | Disposition: A | Discharge status: Home / Self Care | Payer: Self-pay | Source: Home / Self Care | Attending: Orthopedic Surgery

## 2020-08-15 ENCOUNTER — Other Ambulatory Visit: Payer: Self-pay

## 2020-08-15 ENCOUNTER — Ambulatory Visit: Payer: Medicare Other | Admitting: Urgent Care

## 2020-08-15 ENCOUNTER — Encounter: Payer: Self-pay | Admitting: Orthopedic Surgery

## 2020-08-15 DIAGNOSIS — Z79899 Other long term (current) drug therapy: Secondary | ICD-10-CM | POA: Diagnosis not present

## 2020-08-15 DIAGNOSIS — L859 Epidermal thickening, unspecified: Secondary | ICD-10-CM | POA: Insufficient documentation

## 2020-08-15 DIAGNOSIS — L570 Actinic keratosis: Secondary | ICD-10-CM | POA: Diagnosis not present

## 2020-08-15 DIAGNOSIS — M60242 Foreign body granuloma of soft tissue, not elsewhere classified, left hand: Secondary | ICD-10-CM | POA: Diagnosis present

## 2020-08-15 DIAGNOSIS — Z791 Long term (current) use of non-steroidal anti-inflammatories (NSAID): Secondary | ICD-10-CM | POA: Insufficient documentation

## 2020-08-15 DIAGNOSIS — E119 Type 2 diabetes mellitus without complications: Secondary | ICD-10-CM | POA: Insufficient documentation

## 2020-08-15 DIAGNOSIS — J449 Chronic obstructive pulmonary disease, unspecified: Secondary | ICD-10-CM | POA: Diagnosis not present

## 2020-08-15 DIAGNOSIS — Z9981 Dependence on supplemental oxygen: Secondary | ICD-10-CM | POA: Insufficient documentation

## 2020-08-15 DIAGNOSIS — Z7984 Long term (current) use of oral hypoglycemic drugs: Secondary | ICD-10-CM | POA: Diagnosis not present

## 2020-08-15 DIAGNOSIS — Z79890 Hormone replacement therapy (postmenopausal): Secondary | ICD-10-CM | POA: Diagnosis not present

## 2020-08-15 HISTORY — PX: GANGLION CYST EXCISION: SHX1691

## 2020-08-15 LAB — GLUCOSE, CAPILLARY: Glucose-Capillary: 88 mg/dL (ref 70–99)

## 2020-08-15 SURGERY — EXCISION, GANGLION CYST, WRIST
Anesthesia: General | Site: Wrist | Laterality: Right

## 2020-08-15 MED ORDER — KETAMINE HCL 10 MG/ML IJ SOLN
INTRAMUSCULAR | Status: DC | PRN
  Administered 2020-08-15: 20 mg via INTRAVENOUS

## 2020-08-15 MED ORDER — BUPIVACAINE HCL 0.5 % IJ SOLN
INTRAMUSCULAR | Status: DC | PRN
  Administered 2020-08-15: 7.5 mL

## 2020-08-15 MED ORDER — LACTATED RINGERS IV SOLN: INTRAVENOUS | Status: DC | PRN

## 2020-08-15 MED ORDER — MIDAZOLAM HCL 2 MG/2ML IJ SOLN
INTRAMUSCULAR | Status: DC | PRN
  Administered 2020-08-15 (×2): 1 mg via INTRAVENOUS

## 2020-08-15 MED ORDER — GLYCOPYRROLATE 0.2 MG/ML IJ SOLN
INTRAMUSCULAR | Status: DC | PRN
  Administered 2020-08-15: .2 mg via INTRAVENOUS

## 2020-08-15 MED ORDER — LIDOCAINE HCL (PF) 1 % IJ SOLN
INTRAMUSCULAR | Status: DC | PRN
  Administered 2020-08-15: 7.5 mL

## 2020-08-15 MED ORDER — BUPIVACAINE HCL (PF) 0.5 % IJ SOLN
INTRAMUSCULAR | Status: AC
  Filled 2020-08-15: qty 30

## 2020-08-15 MED ORDER — LIDOCAINE HCL (PF) 1 % IJ SOLN
INTRAMUSCULAR | Status: AC
  Filled 2020-08-15: qty 30

## 2020-08-15 MED ORDER — DEXMEDETOMIDINE (PRECEDEX) IN NS 20 MCG/5ML (4 MCG/ML) IV SYRINGE
PREFILLED_SYRINGE | INTRAVENOUS | Status: DC | PRN
  Administered 2020-08-15 (×2): 4 ug via INTRAVENOUS

## 2020-08-15 MED ORDER — HYDROCODONE-ACETAMINOPHEN 5-325 MG PO TABS: 1.0000 | ORAL_TABLET | Freq: Four times a day (QID) | ORAL | 0 refills | Status: DC | PRN

## 2020-08-15 MED ORDER — CHLORHEXIDINE GLUCONATE 0.12 % MT SOLN
OROMUCOSAL | Status: AC
  Administered 2020-08-15: 15 mL via OROMUCOSAL
  Filled 2020-08-15: qty 15

## 2020-08-15 MED ORDER — FENTANYL CITRATE (PF) 100 MCG/2ML IJ SOLN
INTRAMUSCULAR | Status: AC
  Filled 2020-08-15: qty 2

## 2020-08-15 MED ORDER — ONDANSETRON HCL 4 MG/2ML IJ SOLN: 4.0000 mg | Freq: Once | INTRAMUSCULAR | Status: DC | PRN

## 2020-08-15 MED ORDER — MIDAZOLAM HCL 2 MG/2ML IJ SOLN
INTRAMUSCULAR | Status: AC
  Filled 2020-08-15: qty 2

## 2020-08-15 MED ORDER — FENTANYL CITRATE (PF) 100 MCG/2ML IJ SOLN: 25.0000 ug | INTRAMUSCULAR | Status: DC | PRN

## 2020-08-15 MED ORDER — FAMOTIDINE 20 MG PO TABS
ORAL_TABLET | ORAL | Status: AC
  Filled 2020-08-15: qty 1

## 2020-08-15 SURGICAL SUPPLY — 33 items
APL PRP STRL LF DISP 70% ISPRP (MISCELLANEOUS) ×1
BNDG CMPR STD VLCR NS LF 5.8X3 (GAUZE/BANDAGES/DRESSINGS) ×1
BNDG ELASTIC 3X5.8 VLCR NS LF (GAUZE/BANDAGES/DRESSINGS) ×3 IMPLANT
CANISTER SUCT 1200ML W/VALVE (MISCELLANEOUS) ×3 IMPLANT
CAST PADDING 3X4FT ST 30246 (SOFTGOODS) ×2
CHLORAPREP W/TINT 26 (MISCELLANEOUS) ×3 IMPLANT
COVER WAND RF STERILE (DRAPES) ×3 IMPLANT
CUFF TOURN SGL QUICK 18X4 (TOURNIQUET CUFF) IMPLANT
ELECT CAUTERY NDL 2.0 MIC (NEEDLE) ×1 IMPLANT
ELECT CAUTERY NEEDLE 2.0 MIC (NEEDLE) ×3 IMPLANT
GAUZE SPONGE 4X4 12PLY STRL (GAUZE/BANDAGES/DRESSINGS) ×3 IMPLANT
GAUZE XEROFORM 1X8 LF (GAUZE/BANDAGES/DRESSINGS) ×3 IMPLANT
GLOVE SURG SYN 9.0  PF PI (GLOVE) ×3
GLOVE SURG SYN 9.0 PF PI (GLOVE) ×1 IMPLANT
GOWN SRG 2XL LVL 4 RGLN SLV (GOWNS) ×1 IMPLANT
GOWN STRL NON-REIN 2XL LVL4 (GOWNS) ×3
GOWN STRL REUS W/ TWL LRG LVL3 (GOWN DISPOSABLE) ×1 IMPLANT
GOWN STRL REUS W/TWL LRG LVL3 (GOWN DISPOSABLE) ×3
KIT TURNOVER KIT A (KITS) ×3 IMPLANT
MANIFOLD NEPTUNE II (INSTRUMENTS) ×3 IMPLANT
NS IRRIG 500ML POUR BTL (IV SOLUTION) ×3 IMPLANT
PACK EXTREMITY ARMC (MISCELLANEOUS) ×3 IMPLANT
PAD CAST CTTN 3X4 STRL (SOFTGOODS) ×1 IMPLANT
PADDING CAST COTTON 3X4 STRL (SOFTGOODS) ×1
SCALPEL PROTECTED #15 DISP (BLADE) ×6 IMPLANT
SPLINT CAST 1 STEP 3X12 (MISCELLANEOUS) ×3 IMPLANT
SUT ETHILON 4-0 (SUTURE) ×3
SUT ETHILON 4-0 FS2 18XMFL BLK (SUTURE) ×1
SUT ETHILON 5-0 FS-2 18 BLK (SUTURE) ×3 IMPLANT
SUT MNCRL 4-0 (SUTURE) ×3
SUT MNCRL 4-018XMFL (SUTURE) ×1
SUTURE ETHLN 4-0 FS2 18XMF BLK (SUTURE) ×1 IMPLANT
SUTURE MNCRL 4-018XMF (SUTURE) ×1 IMPLANT

## 2020-08-15 NOTE — H&P (Signed)
Chief Complaint  Patient presents with  . Right Hand - Pain   Reason for Visit Sarah Norton is a 65 y.o. who presents today for evaluation of mass-effect to the dorsum aspect of the MP joint of right index finger. Patient is not aware of how this may have happened. It has been there 6 months. Was worse than what it is now but has improved but is now plateaued. She is a diabetic. Not really experience any pain. Denies any numbness, tingling or burning sensation. Area continues to be red.  Past Medical History Past Medical History:  Diagnosis Date  . Arthritis  . Diabetes mellitus type 2, uncomplicated (CMS-HCC)  . Hypertension  . Thyroid disease  . Tremor   Past Surgical History Past Surgical History:  Procedure Laterality Date  . CHOLECYSTECTOMY  . THYROIDECTOMY FOR REMOVAL REMAINING TISSUE   Past Family History Family History  Problem Relation Age of Onset  . Cancer Sister   Medications Current Outpatient Medications Ordered in Epic  Medication Sig Dispense Refill  . albuterol 90 mcg/actuation inhaler Inhale into the lungs.  Marland Kitchen atorvastatin (LIPITOR) 40 MG tablet Take 40 mg by mouth once daily.  . benazepril (LOTENSIN) 20 MG tablet Take 20 mg by mouth once daily.   . benazepril-hydrochlorthiazide (LOTENSIN HCT) 20-25 mg tablet Take 1 tablet by mouth once daily.  . diclofenac (VOLTAREN) 75 MG EC tablet  . fluticasone (FLONASE) 50 mcg/actuation nasal spray by Nasal route.  . gabapentin (NEURONTIN) 300 MG capsule 300 mg 3 (three) times daily.  Marland Kitchen glipiZIDE (GLUCOTROL XL) 10 MG XL tablet Take 10 mg by mouth once daily.  . indomethacin (INDOCIN) 25 MG capsule INDOMETHACIN 25 MG CAPS  . levothyroxine (SYNTHROID, LEVOTHROID) 200 MCG tablet Take 200 mcg by mouth once daily. Take on an empty stomach with a glass of water at least 30-60 minutes before breakfast.  . metFORMIN (GLUCOPHAGE-XR) 500 MG XR tablet Take 500 mg by mouth daily with dinner.  . montelukast (SINGULAIR) 10  mg tablet Take 10 mg by mouth nightly.  . nystatin (MYCOSTATIN) 100,000 unit/gram powder Apply topically 2 (two) times daily.   No current Epic-ordered facility-administered medications on file.   Allergies No Known Allergies   Review of Systems A comprehensive 14 point ROS was performed, reviewed, and the pertinent orthopaedic findings are documented in the HPI.  Exam BP (!) 128/92 (BP Location: Right upper arm, Patient Position: Sitting, BP Cuff Size: Adult)   General: Well-developed well-nourished female seen in no acute distress. Presents today in a motorized wheelchair  HEENT: Atraumatic,normocephalic. Pupils are equal and reactive to light. Oropharynx is clear with moist mucosa  Lungs: Clear to auscultation bilaterally   Cardiovascular: Regular rate and rhythm. Normal S1, S2. No murmurs. No appreciable gallops or rubs. Peripheral pulses are palpable.  Abdomen: Soft, non-tender, nondistended. Bowel sounds present  Extremity: Patient is noted to have a area of redness and swelling over the dorsum MP joint of the right index finger. There is also a scabbed over puncture wound area. No pustulant material noted. No tenderness noted to palpation. Cyst area is mobile.  Neurological:  The patient is alert and oriented Sensation to light touch appears to be intact and within normal limits Gross motor strength appeared to be equal to 5/5  Vascular :  Peripheral pulses felt to be palpable. Capillary refill appears to be intact and within normal limits  X-ray  None taken  Impression  1. Possible granuloma with possible some infection dorsal MP  joint right index finger  Plan   1. Dr. Rudene Christians has evaluated this patient and discussed the risks and complications as well as benefits. 2. Return to clinic postop as scheduled 3. We will schedule patient for outpatient surgery at the hospital  This note was generated in part with voice recognition software and I apologize for  any typographical errors that were not detected and corrected   Watt Climes PA Electronically signed by Regino Bellow, PA at 07/26/2020 12:06 PM EDT  Back to top of Progress Notes    Reviewed  H+P. No changes noted.

## 2020-08-15 NOTE — Anesthesia Preprocedure Evaluation (Signed)
Anesthesia Evaluation  Patient identified by MRN, date of birth, ID band Patient awake    Reviewed: Allergy & Precautions, NPO status , Patient's Chart, lab work & pertinent test results  History of Anesthesia Complications Negative for: history of anesthetic complications  Airway Mallampati: II  TM Distance: >3 FB Neck ROM: Full    Dental no notable dental hx.    Pulmonary asthma , COPD,  oxygen dependent, Patient abstained from smoking., former smoker,    breath sounds clear to auscultation- rhonchi (-) wheezing      Cardiovascular hypertension, Pt. on medications (-) CAD, (-) Past MI, (-) Cardiac Stents and (-) CABG  Rhythm:Regular Rate:Normal - Systolic murmurs and - Diastolic murmurs    Neuro/Psych neg Seizures negative neurological ROS  negative psych ROS   GI/Hepatic Neg liver ROS, hiatal hernia,   Endo/Other  diabetes, Insulin DependentHypothyroidism   Renal/GU negative Renal ROS     Musculoskeletal  (+) Arthritis ,   Abdominal (+) + obese,   Peds  Hematology negative hematology ROS (+)   Anesthesia Other Findings Past Medical History: No date: Arthritis No date: Asthma No date: Cataract of both eyes No date: Complication of anesthesia     Comment:  woke up during surgery x1 No date: COPD (chronic obstructive pulmonary disease) (HCC) No date: Coronary artery disease No date: DDD (degenerative disc disease), lumbar No date: Diabetes mellitus without complication (HCC) No date: Dyspnea     Comment:  DUE TO COPD No date: Hernia of abdominal wall No date: History of hiatal hernia     Comment:  small No date: Hyperlipidemia No date: Hypertension No date: Hypothyroidism     Comment:  hashimotos throiditis  -thyroidectomy  2000 No date: Occasional tremors No date: Oxygen desaturation     Comment:  NOW PT ON 3-4 LITERS [place] DAILY   Reproductive/Obstetrics                              Anesthesia Physical Anesthesia Plan  ASA: IV  Anesthesia Plan: General   Post-op Pain Management:    Induction: Intravenous  PONV Risk Score and Plan: 2 and Propofol infusion  Airway Management Planned: Natural Airway  Additional Equipment:   Intra-op Plan:   Post-operative Plan:   Informed Consent: I have reviewed the patients History and Physical, chart, labs and discussed the procedure including the risks, benefits and alternatives for the proposed anesthesia with the patient or authorized representative who has indicated his/her understanding and acceptance.     Dental advisory given  Plan Discussed with: CRNA and Anesthesiologist  Anesthesia Plan Comments:         Anesthesia Quick Evaluation

## 2020-08-15 NOTE — Transfer of Care (Signed)
Immediate Anesthesia Transfer of Care Note  Patient: Sarah Norton  Procedure(s) Performed: Right index finger cyst removal (Right Wrist)  Patient Location: PACU  Anesthesia Type:MAC  Level of Consciousness: awake, alert  and oriented  Airway & Oxygen Therapy: Patient Spontanous Breathing  Post-op Assessment: Report given to RN and Post -op Vital signs reviewed and stable  Post vital signs: Reviewed and stable  Last Vitals:  Vitals Value Taken Time  BP    Temp    Pulse    Resp 27 08/15/20 1514  SpO2    Vitals shown include unvalidated device data.  Last Pain:  Vitals:   08/15/20 1340  PainSc: 0-No pain         Complications: No complications documented.

## 2020-08-15 NOTE — Discharge Instructions (Signed)
Epidermoid Cyst Removal, Care After This sheet gives you information about how to care for yourself after your procedure. Your health care provider may also give you more specific instructions. If you have problems or questions, contact your health care provider. What can I expect after the procedure? After the procedure, it is common to have:  Soreness in the area where your cyst was removed.  Tightness or itchiness from the stitches (sutures) in your skin. Follow these instructions at home: Medicines  Take over-the-counter and prescription medicines only as told by your health care provider.  If you were prescribed an antibiotic medicine or ointment, take or apply it as told by your health care provider. Do not stop using the antibiotic even if you start to feel better. Incision care  Follow instructions from your health care provider about how to take care of your incision. Make sure you: ? Wash your hands with soap and water for at least 20 seconds before you change your bandage (dressing). If soap and water are not available, use hand sanitizer. ? Change your dressing as told by your health care provider. ? Leave sutures, skin glue, or adhesive strips in place. These skin closures may need to stay in place for 1-2 weeks or longer. If adhesive strip edges start to loosen and curl up, you may trim the loose edges. Do not remove adhesive strips completely unless your health care provider tells you to do that.  Keep the dressing dry until your health care provider says that it can be removed.  After your dressing is off, check your incision area every day for signs of infection. Check for: ? Redness, swelling, or pain. ? Fluid or blood. ? Warmth. ? Pus or a bad smell.   General instructions  Do not take baths, swim, or use a hot tub until your health care provider approves. Ask your health care provider if you may take showers. You may only be allowed to take sponge baths.  Your  health care provider may ask you to avoid contact sports or activities that take a lot of effort. Do not do anything that stretches or puts pressure on your incision.  You can return to your normal diet.  Keep all follow-up visits. This is important. Contact a health care provider if:  You have a fever.  You have redness, swelling, or pain in the incision area.  You have fluid or blood coming from your incision.  You have pus or a bad smell coming from your incision.  Your incision feels warm to the touch.  Your cyst grows back. Get help right away if:  If the incision site suddenly increases in size and you have pain at the incision site. You may be checked for a collection of blood under the skin from the procedure (hematoma). Summary  After the procedure, it is common to have soreness in the area where your cyst was removed.  Take or apply over-the-counter and prescription medicines only as told by your health care provider.  Follow instructions from your health care provider about how to take care of your incision. This information is not intended to replace advice given to you by your health care provider. Make sure you discuss any questions you have with your health care provider. Document Revised: 06/02/2019 Document Reviewed: 06/02/2019 Elsevier Patient Education  2021 [place]   1) The drugs that you were given will stay in your system until tomorrow so  for the next 24 hours you should not:  A) Drive an automobile B) Make any legal decisions C) Drink any alcoholic beverage   2) You may resume regular meals tomorrow.  Today it is better to start with liquids and gradually work up to solid foods.  You may eat anything you prefer, but it is better to start with liquids, then soup and crackers, and gradually work up to solid foods.   3) Please notify your doctor immediately if you have any unusual bleeding,  trouble breathing, redness and pain at the surgery site, drainage, fever, or pain not relieved by medication. Please contact your physician with any problems or Same Day Surgery at [phone], Monday through Friday 6 am to 4 pm, or [place] at [facility] number at [phone].

## 2020-08-15 NOTE — Op Note (Signed)
08/15/2020  3:13 PM  PATIENT:  Sarah Norton  65 y.o. female  PRE-OPERATIVE DIAGNOSIS:  Foreign body granuloma of soft tissue, not elsewhere classified, left hand M60.242  POST-OPERATIVE DIAGNOSIS:  Foreign body granuloma of soft tissue, not elsewhere classified, left hand M60.242  PROCEDURE:  Procedure(s): Right index finger cyst removal (Right)  SURGEON: Laurene Footman, MD  ASSISTANTS: None  ANESTHESIA:   local and MAC  EBL:  No intake/output data recorded.  BLOOD ADMINISTERED:none  DRAINS: none   LOCAL MEDICATIONS USED:  MARCAINE    and XYLOCAINE   SPECIMEN:  Source of Specimen:  Skin lesion  DISPOSITION OF SPECIMEN:  PATHOLOGY  COUNTS:  YES  Tourniquet: 10 minutes at 250 mmHg tourniquet  IMPLANTS: None  DICTATION: .Dragon Dictation patient was brought to the operating room and after IV sedation was given appropriate patient identification timeout procedure completed 10 cc of 1% Xylocaine and half percent Marcaine each was infiltrated approximately 15 cc total of the mixture around the area of the planned X incision.  The hand was then prepped and draped in usual sterile fashion was turned applied the upper forearm.  Tourniquet was raised and elliptical incision made around the metacarpal head of the right second metacarpal.  The skin lesion was elliptically excised and there was granulation tissue adherent from the skin to the extensor tendon which was sharply dissected with scissors of the extensor tendon with it being predominantly removed with this technique small hand rondure was also used.  Thoroughly irrigating the wound there appeared that the granuloma was from the skin and affected the subcutaneous layer without going any deeper with the extensor tendon intact.  There is no purulent material.  After this adequate excision the wound was thoroughly irrigated and closed with simple interrupted 4-0 nylon skin sutures and dressed with Xeroform 4 x 4's web roll and Ace wrap  tourniquet let down prior to closing the case.  PLAN OF CARE: Discharge to home after PACU  PATIENT DISPOSITION:  PACU - hemodynamically stable.

## 2020-08-16 ENCOUNTER — Encounter: Payer: Self-pay | Admitting: Orthopedic Surgery

## 2020-08-16 NOTE — Anesthesia Postprocedure Evaluation (Signed)
Anesthesia Post Note  Patient: Sarah Norton  Procedure(s) Performed: Right index finger cyst removal (Right Wrist)  Patient location during evaluation: PACU Anesthesia Type: General Level of consciousness: awake and alert and oriented Pain management: pain level controlled Vital Signs Assessment: post-procedure vital signs reviewed and stable Respiratory status: spontaneous breathing, nonlabored ventilation and respiratory function stable Cardiovascular status: blood pressure returned to baseline and stable Postop Assessment: no signs of nausea or vomiting Anesthetic complications: no   No complications documented.   Last Vitals:  Vitals:   08/15/20 1533 08/15/20 1549  BP: 99/70 102/74  Pulse: 83 84  Resp: 17 18  Temp:  36.6 C  SpO2: 99% 94%    Last Pain:  Vitals:   08/15/20 1549  TempSrc: Temporal  PainSc: 0-No pain                 [name]

## 2020-08-18 LAB — SURGICAL PATHOLOGY

## 2020-10-03 ENCOUNTER — Telehealth: Payer: Self-pay | Admitting: Internal Medicine

## 2020-10-03 DIAGNOSIS — J449 Chronic obstructive pulmonary disease, unspecified: Secondary | ICD-10-CM

## 2020-10-03 MED ORDER — ADVAIR HFA 115-21 MCG/ACT IN AERO: 2.0000 | INHALATION_SPRAY | Freq: Two times a day (BID) | RESPIRATORY_TRACT | 0 refills | Status: DC

## 2020-10-03 MED ORDER — SPIRIVA RESPIMAT 1.25 MCG/ACT IN AERS: 2.0000 | INHALATION_SPRAY | Freq: Every day | RESPIRATORY_TRACT | 0 refills | Status: AC

## 2020-10-03 NOTE — Telephone Encounter (Signed)
Pt is requesting a refill for the medications; Advair inhaler and Spiriva Respimat.  Pharmacy; [facility]   Pls regard; [phone]

## 2020-10-03 NOTE — Telephone Encounter (Signed)
Rx for spiriva and advair has been sent to preferred pharmacy.  Left message for patient to make her aware.  Nothing further needed at this time.

## 2021-07-07 ENCOUNTER — Other Ambulatory Visit: Payer: Self-pay | Admitting: Internal Medicine

## 2021-07-07 DIAGNOSIS — J449 Chronic obstructive pulmonary disease, unspecified: Secondary | ICD-10-CM

## 2021-09-11 ENCOUNTER — Other Ambulatory Visit: Payer: Self-pay | Admitting: Internal Medicine

## 2021-09-11 DIAGNOSIS — J449 Chronic obstructive pulmonary disease, unspecified: Secondary | ICD-10-CM

## 2021-10-18 ENCOUNTER — Other Ambulatory Visit: Payer: Self-pay | Admitting: Internal Medicine

## 2021-10-18 DIAGNOSIS — J449 Chronic obstructive pulmonary disease, unspecified: Secondary | ICD-10-CM

## 2021-10-23 ENCOUNTER — Other Ambulatory Visit: Payer: Self-pay | Admitting: Internal Medicine

## 2021-10-23 DIAGNOSIS — J449 Chronic obstructive pulmonary disease, unspecified: Secondary | ICD-10-CM

## 2021-10-25 ENCOUNTER — Other Ambulatory Visit: Payer: Self-pay | Admitting: Internal Medicine

## 2021-10-25 DIAGNOSIS — J449 Chronic obstructive pulmonary disease, unspecified: Secondary | ICD-10-CM

## 2022-02-18 ENCOUNTER — Ambulatory Visit (INDEPENDENT_AMBULATORY_CARE_PROVIDER_SITE_OTHER): Payer: Medicare Other | Admitting: Dermatology

## 2022-02-18 ENCOUNTER — Encounter: Payer: Self-pay | Admitting: Dermatology

## 2022-02-18 DIAGNOSIS — Z79899 Other long term (current) drug therapy: Secondary | ICD-10-CM | POA: Diagnosis not present

## 2022-02-18 DIAGNOSIS — L821 Other seborrheic keratosis: Secondary | ICD-10-CM

## 2022-02-18 DIAGNOSIS — L988 Other specified disorders of the skin and subcutaneous tissue: Secondary | ICD-10-CM | POA: Diagnosis not present

## 2022-02-18 DIAGNOSIS — L578 Other skin changes due to chronic exposure to nonionizing radiation: Secondary | ICD-10-CM | POA: Diagnosis not present

## 2022-02-18 DIAGNOSIS — L820 Inflamed seborrheic keratosis: Secondary | ICD-10-CM | POA: Diagnosis not present

## 2022-02-18 MED ORDER — TRETINOIN 0.05 % EX CREA: TOPICAL_CREAM | CUTANEOUS | 11 refills | Status: DC

## 2022-02-18 NOTE — Progress Notes (Signed)
   New Patient Visit  Subjective  Sarah Norton is a 66 y.o. female who presents for the following: lesion (Right medial knee. Rubbed and irritated. Bleeds at times. Dur: >30 years) and Facial Elastosis (Would like Rx for Tretinoin. Has used 0.05% in the past). The patient has spots, moles and lesions to be evaluated, some may be new or changing and the patient has concerns that these could be cancer.  Review of Systems: No other skin or systemic complaints except as noted in HPI or Assessment and Plan.  Objective  Well appearing patient in no apparent distress; mood and affect are within normal limits.  A focused examination was performed including face, right leg. Relevant physical exam findings are noted in the Assessment and Plan.  Right Knee - Medial x1 Erythematous keratotic or waxy stuck-on papule or plaque.  face Rhytides and volume loss.    Assessment & Plan   Actinic Damage - chronic, secondary to cumulative UV radiation exposure/sun exposure over time - diffuse scaly erythematous macules with underlying dyspigmentation - Recommend daily broad spectrum sunscreen SPF 30+ to sun-exposed areas, reapply every 2 hours as needed.  - Recommend staying in the shade or wearing long sleeves, sun glasses (UVA+UVB protection) and wide brim hats (4-inch brim around the entire circumference of the hat). - Call for new or changing lesions.   Seborrheic Keratoses - Stuck-on, waxy, tan-brown papules and/or plaques  - Benign-appearing - Discussed benign etiology and prognosis. - Observe - Call for any changes  Inflamed seborrheic keratosis Right Knee - Medial x1  Symptomatic, irritating, patient would like treated.  Destruction of lesion - Right Knee - Medial x1 Complexity: simple   Destruction method: cryotherapy   Informed consent: discussed and consent obtained   Timeout:  patient name, date of birth, surgical site, and procedure verified Lesion destroyed using liquid  nitrogen: Yes   Region frozen until ice ball extended beyond lesion: Yes   Outcome: patient tolerated procedure well with no complications   Post-procedure details: wound care instructions given   Additional details:  Prior to procedure, discussed risks of blister formation, small wound, skin dyspigmentation, or rare scar following cryotherapy. Recommend Vaseline ointment to treated areas while healing.   Elastosis of skin face  Start Tretinoin 0.05% cream every night at bedtime, wash off in morning.   Topical retinoid medications like tretinoin/Retin-A, adapalene/Differin, tazarotene/Fabior, and Epiduo/Epiduo Forte can cause dryness and irritation when first started. Only apply a pea-sized amount to the entire affected area. Avoid applying it around the eyes, edges of mouth and creases at the nose. If you experience irritation, use a good moisturizer first and/or apply the medicine less often. If you are doing well with the medicine, you can increase how often you use it until you are applying every night. Be careful with sun protection while using this medication as it can make you sensitive to the sun. This medicine should not be used by pregnant women.    tretinoin (RETIN-A) 0.05 % cream - face Apply to face qhs, wash off qam   Return in about 2 months (around 04/21/2022) for ISK Follow Up.  I, Emelia Salisbury, CMA, am acting as scribe for Sarina Ser, MD. Documentation: I have reviewed the above documentation for accuracy and completeness, and I agree with the above.  Sarina Ser, MD

## 2022-02-18 NOTE — Patient Instructions (Addendum)
Cryotherapy Aftercare  Wash gently with soap and water everyday.   Apply Vaseline and Band-Aid daily until healed.    Start Tretinoin 0.05% cream every night at bedtime, wash off in morning.   Topical retinoid medications like tretinoin/Retin-A, adapalene/Differin, tazarotene/Fabior, and Epiduo/Epiduo Forte can cause dryness and irritation when first started. Only apply a pea-sized amount to the entire affected area. Avoid applying it around the eyes, edges of mouth and creases at the nose. If you experience irritation, use a good moisturizer first and/or apply the medicine less often. If you are doing well with the medicine, you can increase how often you use it until you are applying every night. Be careful with sun protection while using this medication as it can make you sensitive to the sun. This medicine should not be used by pregnant women.     Seborrheic Keratosis  What causes seborrheic keratoses? Seborrheic keratoses are harmless, common skin growths that first appear during adult life.  As time goes by, more growths appear.  Some people may develop a large number of them.  Seborrheic keratoses appear on both covered and uncovered body parts.  They are not caused by sunlight.  The tendency to develop seborrheic keratoses can be inherited.  They vary in color from skin-colored to gray, brown, or even [place].  They can be either smooth or have a rough, warty surface.   Seborrheic keratoses are superficial and look as if they were stuck on the skin.  Under the microscope this type of keratosis looks like layers upon layers of skin.  That is why at times the top layer may seem to fall off, but the rest of the growth remains and re-grows.    Treatment Seborrheic keratoses do not need to be treated, but can easily be removed in the office.  Seborrheic keratoses often cause symptoms when they rub on clothing or jewelry.  Lesions can be in the way of shaving.  If they become inflamed, they can  cause itching, soreness, or burning.  Removal of a seborrheic keratosis can be accomplished by freezing, burning, or surgery. If any spot bleeds, scabs, or grows rapidly, please return to have it checked, as these can be an indication of a skin cancer.   Due to recent changes in healthcare laws, you may see results of your pathology and/or laboratory studies on MyChart before the doctors have had a chance to review them. We understand that in some cases there may be results that are confusing or concerning to you. Please understand that not all results are received at the same time and often the doctors may need to interpret multiple results in order to provide you with the best plan of care or course of treatment. Therefore, we ask that you please give [place] 2 business days to thoroughly review all your results before contacting the office for clarification. Should we see a critical lab result, you will be contacted sooner.   If You Need Anything After Your Visit  If you have any questions or concerns for your doctor, please call our main line at [phone] and press option 4 to reach your doctor's medical assistant. If no one answers, please leave a voicemail as directed and we will return your call as soon as possible. Messages left after 4 pm will be answered the following business day.   You may also send [place] a message via [facility]. We typically respond to MyChart messages within 1-2 business days.  For prescription refills, please ask  your pharmacy to contact our office. Our fax number is [phone].  If you have an urgent issue when the clinic is closed that cannot wait until the next business day, you can page your doctor at the number below.    Please note that while we do our best to be available for urgent issues outside of office hours, we are not available 24/7.   If you have an urgent issue and are unable to reach [place], you may choose to seek medical care at your doctor's office, retail  clinic, urgent care center, or emergency room.  If you have a medical emergency, please immediately call 911 or go to the emergency department.  Pager Numbers  - Dr. Nehemiah Massed: [phone]  - Dr. Laurence Ferrari: [phone]  - Dr. Nicole Kindred: [phone]  In the event of inclement weather, please call our main line at [phone] for an update on the status of any delays or closures.  Dermatology Medication Tips: Please keep the boxes that topical medications come in in order to help keep track of the instructions about where and how to use these. Pharmacies typically print the medication instructions only on the boxes and not directly on the medication tubes.   If your medication is too expensive, please contact our office at [phone] option 4 or send [place] a message through [place].   We are unable to tell what your co-pay for medications will be in advance as this is different depending on your insurance coverage. However, we may be able to find a substitute medication at lower cost or fill out paperwork to get insurance to cover a needed medication.   If a prior authorization is required to get your medication covered by your insurance company, please allow [place] 1-2 business days to complete this process.  Drug prices often vary depending on where the prescription is filled and some pharmacies may offer cheaper prices.  The website www.goodrx.com contains coupons for medications through different pharmacies. The prices here do not account for what the cost may be with help from insurance (it may be cheaper with your insurance), but the website can give you the price if you did not use any insurance.  - You can print the associated coupon and take it with your prescription to the pharmacy.  - You may also stop by our office during regular business hours and pick up a GoodRx coupon card.  - If you need your prescription sent electronically to a different pharmacy, notify our office through [facility] or by phone at [phone] option 4.     Si Usted Necesita Algo Despus de Su Visita  Tambin puede enviarnos un mensaje a travs de Pharmacist, community. Por lo general respondemos a los mensajes de MyChart en el transcurso de 1 a 2 das hbiles.  Para renovar recetas, por favor pida a su farmacia que se ponga en contacto con nuestra oficina. Harland Dingwall de fax es [place] [phone].  Si tiene un asunto urgente cuando la clnica est cerrada y que no puede esperar hasta el siguiente da hbil, puede llamar/localizar a su doctor(a) al nmero que aparece a continuacin.   Por favor, tenga en cuenta que aunque hacemos todo lo posible para estar disponibles para asuntos urgentes fuera del horario de [place], no estamos disponibles las 24 horas del da, los 7 das de la [place].   Si tiene un problema urgente y no puede comunicarse con nosotros, puede optar por buscar atencin mdica  en el consultorio de su doctor(a),  en una clnica privada, en un centro de atencin urgente o en una sala de emergencias.  Si tiene Engineering geologist, por favor llame inmediatamente al 911 o vaya a la sala de emergencias.  Nmeros de bper  - Dr. Nehemiah Massed: [phone]  - Dra. Moye: [phone]  - Dra. Nicole Kindred: [phone]  En caso de inclemencias del [place], por favor llame a Johnsie Kindred principal al [phone] para una actualizacin sobre el [place] de cualquier retraso o cierre.  Consejos para la medicacin en dermatologa: Por favor, guarde las cajas en las que vienen los medicamentos de uso tpico para ayudarle a seguir las instrucciones sobre dnde y cmo usarlos. Las farmacias generalmente imprimen las instrucciones del medicamento slo en las cajas y no directamente en los tubos del [place].   Si su medicamento es muy caro, por favor, pngase en contacto con Zigmund Daniel llamando al [phone] y presione la opcin 4 o envenos un mensaje a travs de Pharmacist, community.   No podemos decirle  cul ser su copago por los medicamentos por adelantado ya que esto es diferente dependiendo de la cobertura de su seguro. Sin embargo, es posible que podamos encontrar un medicamento sustituto a Electrical engineer un formulario para que el seguro cubra el medicamento que se considera necesario.   Si se requiere una autorizacin previa para que su compaa de seguros [place] su medicamento, por favor permtanos de 1 a 2 das hbiles para completar este proceso.  Los precios de los medicamentos varan con frecuencia dependiendo del Environmental consultant de dnde se surte la receta y alguna farmacias pueden ofrecer precios ms baratos.  El sitio web www.goodrx.com tiene cupones para medicamentos de Airline pilot. Los precios aqu no tienen en cuenta lo que podra costar con la ayuda del seguro (puede ser ms barato con su seguro), pero el sitio web puede darle el precio si no utiliz Research scientist (physical sciences).  - Puede imprimir el cupn correspondiente y llevarlo con su receta a la farmacia.  - Tambin puede pasar por nuestra oficina durante el horario de atencin regular y Charity fundraiser una tarjeta de cupones de GoodRx.  - Si necesita que su receta se enve electrnicamente a una farmacia diferente, informe a nuestra oficina a travs de MyChart de [place] o por telfono llamando al [phone] y presione la opcin 4.

## 2022-02-26 ENCOUNTER — Encounter: Payer: Self-pay | Admitting: Dermatology

## 2022-04-25 ENCOUNTER — Ambulatory Visit: Payer: 59 | Admitting: Dermatology

## 2022-05-09 ENCOUNTER — Ambulatory Visit (INDEPENDENT_AMBULATORY_CARE_PROVIDER_SITE_OTHER): Payer: 59 | Admitting: Dermatology

## 2022-05-09 DIAGNOSIS — L820 Inflamed seborrheic keratosis: Secondary | ICD-10-CM

## 2022-05-09 NOTE — Progress Notes (Signed)
   Follow-Up Visit   Subjective  Sarah Norton is a 67 y.o. female who presents for the following: Follow-up (2 months f/u on right knee ISK treated with LN2 2 months ago, smaller but still remaining).  The following portions of the chart were reviewed this encounter and updated as appropriate:   Tobacco  Allergies  Meds  Problems  Med Hx  Surg Hx  Fam Hx     Review of Systems:  No other skin or systemic complaints except as noted in HPI or Assessment and Plan.  Objective  Well appearing patient in no apparent distress; mood and affect are within normal limits.  A focused examination was performed including right knee. Relevant physical exam findings are noted in the Assessment and Plan.  right knee Stuck-on, waxy, tan-brown papule -- Discussed benign etiology and prognosis.    Assessment & Plan  Inflamed seborrheic keratosis right knee  Residual ISK  Symptomatic, irritating, patient would like treated.   Destruction of lesion - right knee Complexity: simple   Destruction method: cryotherapy   Informed consent: discussed and consent obtained   Timeout:  patient name, date of birth, surgical site, and procedure verified Lesion destroyed using liquid nitrogen: Yes   Region frozen until ice ball extended beyond lesion: Yes   Outcome: patient tolerated procedure well with no complications   Post-procedure details: wound care instructions given     Return if symptoms worsen or fail to improve.  IMarye Round, CMA, am acting as scribe for Sarina Ser, MD .  Documentation: I have reviewed the above documentation for accuracy and completeness, and I agree with the above.  Sarina Ser, MD

## 2022-05-09 NOTE — Patient Instructions (Addendum)
 Cryotherapy Aftercare  Wash gently with soap and water everyday.   Apply Vaseline and Band-Aid daily until healed. Due to recent changes in healthcare laws, you may see results of your pathology and/or laboratory studies on MyChart before the doctors have had a chance to review them. We understand that in some cases there may be results that are confusing or concerning to you. Please understand that not all results are received at the same time and often the doctors may need to interpret multiple results in order to provide you with the best plan of care or course of treatment. Therefore, we ask that you please give us 2 business days to thoroughly review all your results before contacting the office for clarification. Should we see a critical lab result, you will be contacted sooner.   If You Need Anything After Your Visit  If you have any questions or concerns for your doctor, please call our main line at [phone] and press option 4 to reach your doctor's medical assistant. If no one answers, please leave a voicemail as directed and we will return your call as soon as possible. Messages left after 4 pm will be answered the following business day.   You may also send us a message via MyChart. We typically respond to MyChart messages within 1-2 business days.  For prescription refills, please ask your pharmacy to contact our office. Our fax number is [phone].  If you have an urgent issue when the clinic is closed that cannot wait until the next business day, you can page your doctor at the number below.    Please note that while we do our best to be available for urgent issues outside of office hours, we are not available 24/7.   If you have an urgent issue and are unable to reach us, you may choose to seek medical care at your doctor's office, retail clinic, urgent care center, or emergency room.  If you have a medical emergency, please immediately call 911 or go to the emergency  department.  Pager Numbers  - Dr. Kowalski: [phone]  - Dr. Moye: [phone]  - Dr. Stewart: [phone]  In the event of inclement weather, please call our main line at [phone] for an update on the status of any delays or closures.  Dermatology Medication Tips: Please keep the boxes that topical medications come in in order to help keep track of the instructions about where and how to use these. Pharmacies typically print the medication instructions only on the boxes and not directly on the medication tubes.   If your medication is too expensive, please contact our office at [phone] option 4 or send us a message through MyChart.   We are unable to tell what your co-pay for medications will be in advance as this is different depending on your insurance coverage. However, we may be able to find a substitute medication at lower cost or fill out paperwork to get insurance to cover a needed medication.   If a prior authorization is required to get your medication covered by your insurance company, please allow us 1-2 business days to complete this process.  Drug prices often vary depending on where the prescription is filled and some pharmacies may offer cheaper prices.  The website www.goodrx.com contains coupons for medications through different pharmacies. The prices here do not account for what the cost may be with help from insurance (it may be cheaper with your insurance), but the website can give you the   price if you did not use any insurance.  - You can print the associated coupon and take it with your prescription to the pharmacy.  - You may also stop by our office during regular business hours and pick up a GoodRx coupon card.  - If you need your prescription sent electronically to a different pharmacy, notify our office through [facility] MyChart or by phone at [phone] option 4.     Si Usted Necesita Algo Despus de Su Visita  Tambin puede enviarnos un  mensaje a travs de MyChart. Por lo general respondemos a los mensajes de MyChart en el transcurso de 1 a 2 das hbiles.  Para renovar recetas, por favor pida a su farmacia que se ponga en contacto con nuestra oficina. Nuestro nmero de fax es el [phone].  Si tiene un asunto urgente cuando la clnica est cerrada y que no puede esperar hasta el siguiente da hbil, puede llamar/localizar a su doctor(a) al nmero que aparece a continuacin.   Por favor, tenga en cuenta que aunque hacemos todo lo posible para estar disponibles para asuntos urgentes fuera del horario de oficina, no estamos disponibles las 24 horas del da, los 7 das de la semana.   Si tiene un problema urgente y no puede comunicarse con nosotros, puede optar por buscar atencin mdica  en el consultorio de su doctor(a), en una clnica privada, en un centro de atencin urgente o en una sala de emergencias.  Si tiene una emergencia mdica, por favor llame inmediatamente al 911 o vaya a la sala de emergencias.  Nmeros de bper  - Dr. Kowalski: [phone]  - Dra. Moye: [phone]  - Dra. Stewart: [phone]  En caso de inclemencias del tiempo, por favor llame a nuestra lnea principal al [phone] para una actualizacin sobre el estado de cualquier retraso o cierre.  Consejos para la medicacin en dermatologa: Por favor, guarde las cajas en las que vienen los medicamentos de uso tpico para ayudarle a seguir las instrucciones sobre dnde y cmo usarlos. Las farmacias generalmente imprimen las instrucciones del medicamento slo en las cajas y no directamente en los tubos del medicamento.   Si su medicamento es muy caro, por favor, pngase en contacto con nuestra oficina llamando al [phone] y presione la opcin 4 o envenos un mensaje a travs de MyChart.   No podemos decirle cul ser su copago por los medicamentos por adelantado ya que esto es diferente dependiendo de la cobertura de su seguro. Sin embargo,  es posible que podamos encontrar un medicamento sustituto a menor costo o llenar un formulario para que el seguro cubra el medicamento que se considera necesario.   Si se requiere una autorizacin previa para que su compaa de seguros cubra su medicamento, por favor permtanos de 1 a 2 das hbiles para completar este proceso.  Los precios de los medicamentos varan con frecuencia dependiendo del lugar de dnde se surte la receta y alguna farmacias pueden ofrecer precios ms baratos.  El sitio web www.goodrx.com tiene cupones para medicamentos de diferentes farmacias. Los precios aqu no tienen en cuenta lo que podra costar con la ayuda del seguro (puede ser ms barato con su seguro), pero el sitio web puede darle el precio si no utiliz ningn seguro.  - Puede imprimir el cupn correspondiente y llevarlo con su receta a la farmacia.  - Tambin puede pasar por nuestra oficina durante el horario de atencin regular y recoger una tarjeta de cupones de GoodRx.  - Si necesita que   su receta se enve electrnicamente a una farmacia diferente, informe a nuestra oficina a travs de MyChart de [place] o por telfono llamando al [phone] y presione la opcin 4.  

## 2022-05-17 ENCOUNTER — Encounter: Payer: Self-pay | Admitting: Dermatology

## 2022-08-30 ENCOUNTER — Ambulatory Visit
Admission: RE | Admit: 2022-08-30 | Discharge: 2022-08-30 | Disposition: A | Discharge status: Home / Self Care | Payer: 59 | Source: Ambulatory Visit | Attending: Family Medicine | Admitting: Family Medicine

## 2022-08-30 ENCOUNTER — Other Ambulatory Visit: Payer: Self-pay | Admitting: Family Medicine

## 2022-08-30 DIAGNOSIS — M25561 Pain in right knee: Secondary | ICD-10-CM | POA: Insufficient documentation

## 2022-08-30 DIAGNOSIS — M25562 Pain in left knee: Secondary | ICD-10-CM

## 2023-04-24 ENCOUNTER — Inpatient Hospital Stay: Payer: 59

## 2023-04-24 ENCOUNTER — Emergency Department: Payer: 59

## 2023-04-24 ENCOUNTER — Inpatient Hospital Stay
Admission: EM | Admit: 2023-04-24 | Discharge: 2023-05-15 | DRG: 870 | Disposition: A | Discharge status: Skilled Nursing Facility | Payer: 59 | Attending: Internal Medicine | Admitting: Internal Medicine

## 2023-04-24 ENCOUNTER — Other Ambulatory Visit: Payer: Self-pay

## 2023-04-24 ENCOUNTER — Encounter: Payer: Self-pay | Admitting: Pulmonary Disease

## 2023-04-24 DIAGNOSIS — G20C Parkinsonism, unspecified: Secondary | ICD-10-CM | POA: Diagnosis present

## 2023-04-24 DIAGNOSIS — E873 Alkalosis: Secondary | ICD-10-CM | POA: Diagnosis not present

## 2023-04-24 DIAGNOSIS — E890 Postprocedural hypothyroidism: Secondary | ICD-10-CM | POA: Diagnosis present

## 2023-04-24 DIAGNOSIS — Z7985 Long-term (current) use of injectable non-insulin antidiabetic drugs: Secondary | ICD-10-CM

## 2023-04-24 DIAGNOSIS — E871 Hypo-osmolality and hyponatremia: Secondary | ICD-10-CM | POA: Diagnosis present

## 2023-04-24 DIAGNOSIS — J9621 Acute and chronic respiratory failure with hypoxia: Secondary | ICD-10-CM | POA: Diagnosis present

## 2023-04-24 DIAGNOSIS — J690 Pneumonitis due to inhalation of food and vomit: Secondary | ICD-10-CM | POA: Diagnosis not present

## 2023-04-24 DIAGNOSIS — E11649 Type 2 diabetes mellitus with hypoglycemia without coma: Secondary | ICD-10-CM | POA: Diagnosis not present

## 2023-04-24 DIAGNOSIS — A419 Sepsis, unspecified organism: Secondary | ICD-10-CM | POA: Diagnosis not present

## 2023-04-24 DIAGNOSIS — J9601 Acute respiratory failure with hypoxia: Secondary | ICD-10-CM | POA: Diagnosis present

## 2023-04-24 DIAGNOSIS — L8990 Pressure ulcer of unspecified site, unspecified stage: Secondary | ICD-10-CM | POA: Insufficient documentation

## 2023-04-24 DIAGNOSIS — J9622 Acute and chronic respiratory failure with hypercapnia: Secondary | ICD-10-CM | POA: Diagnosis present

## 2023-04-24 DIAGNOSIS — J09X2 Influenza due to identified novel influenza A virus with other respiratory manifestations: Secondary | ICD-10-CM

## 2023-04-24 DIAGNOSIS — R569 Unspecified convulsions: Secondary | ICD-10-CM | POA: Diagnosis not present

## 2023-04-24 DIAGNOSIS — A4189 Other specified sepsis: Principal | ICD-10-CM | POA: Diagnosis present

## 2023-04-24 DIAGNOSIS — J189 Pneumonia, unspecified organism: Principal | ICD-10-CM

## 2023-04-24 DIAGNOSIS — I2489 Other forms of acute ischemic heart disease: Secondary | ICD-10-CM | POA: Diagnosis present

## 2023-04-24 DIAGNOSIS — N1831 Chronic kidney disease, stage 3a: Secondary | ICD-10-CM | POA: Diagnosis present

## 2023-04-24 DIAGNOSIS — Z1152 Encounter for screening for COVID-19: Secondary | ICD-10-CM

## 2023-04-24 DIAGNOSIS — J159 Unspecified bacterial pneumonia: Secondary | ICD-10-CM | POA: Diagnosis present

## 2023-04-24 DIAGNOSIS — I2510 Atherosclerotic heart disease of native coronary artery without angina pectoris: Secondary | ICD-10-CM | POA: Diagnosis present

## 2023-04-24 DIAGNOSIS — J9602 Acute respiratory failure with hypercapnia: Secondary | ICD-10-CM

## 2023-04-24 DIAGNOSIS — G9341 Metabolic encephalopathy: Secondary | ICD-10-CM

## 2023-04-24 DIAGNOSIS — R7989 Other specified abnormal findings of blood chemistry: Secondary | ICD-10-CM | POA: Diagnosis not present

## 2023-04-24 DIAGNOSIS — R6510 Systemic inflammatory response syndrome (SIRS) of non-infectious origin without acute organ dysfunction: Secondary | ICD-10-CM

## 2023-04-24 DIAGNOSIS — E1122 Type 2 diabetes mellitus with diabetic chronic kidney disease: Secondary | ICD-10-CM | POA: Diagnosis present

## 2023-04-24 DIAGNOSIS — I4891 Unspecified atrial fibrillation: Secondary | ICD-10-CM | POA: Diagnosis not present

## 2023-04-24 DIAGNOSIS — M51369 Other intervertebral disc degeneration, lumbar region without mention of lumbar back pain or lower extremity pain: Secondary | ICD-10-CM | POA: Diagnosis present

## 2023-04-24 DIAGNOSIS — G928 Other toxic encephalopathy: Secondary | ICD-10-CM | POA: Diagnosis present

## 2023-04-24 DIAGNOSIS — E874 Mixed disorder of acid-base balance: Secondary | ICD-10-CM | POA: Diagnosis not present

## 2023-04-24 DIAGNOSIS — I480 Paroxysmal atrial fibrillation: Secondary | ICD-10-CM | POA: Diagnosis present

## 2023-04-24 DIAGNOSIS — N170 Acute kidney failure with tubular necrosis: Secondary | ICD-10-CM | POA: Diagnosis present

## 2023-04-24 DIAGNOSIS — E119 Type 2 diabetes mellitus without complications: Secondary | ICD-10-CM

## 2023-04-24 DIAGNOSIS — Z794 Long term (current) use of insulin: Secondary | ICD-10-CM | POA: Diagnosis not present

## 2023-04-24 DIAGNOSIS — Z7901 Long term (current) use of anticoagulants: Secondary | ICD-10-CM

## 2023-04-24 DIAGNOSIS — J449 Chronic obstructive pulmonary disease, unspecified: Secondary | ICD-10-CM | POA: Diagnosis present

## 2023-04-24 DIAGNOSIS — E669 Obesity, unspecified: Secondary | ICD-10-CM | POA: Insufficient documentation

## 2023-04-24 DIAGNOSIS — J111 Influenza due to unidentified influenza virus with other respiratory manifestations: Secondary | ICD-10-CM | POA: Diagnosis not present

## 2023-04-24 DIAGNOSIS — Z9981 Dependence on supplemental oxygen: Secondary | ICD-10-CM

## 2023-04-24 DIAGNOSIS — R6521 Severe sepsis with septic shock: Secondary | ICD-10-CM | POA: Diagnosis present

## 2023-04-24 DIAGNOSIS — N179 Acute kidney failure, unspecified: Secondary | ICD-10-CM

## 2023-04-24 DIAGNOSIS — I4892 Unspecified atrial flutter: Secondary | ICD-10-CM | POA: Diagnosis not present

## 2023-04-24 DIAGNOSIS — J80 Acute respiratory distress syndrome: Secondary | ICD-10-CM | POA: Diagnosis not present

## 2023-04-24 DIAGNOSIS — J1000 Influenza due to other identified influenza virus with unspecified type of pneumonia: Secondary | ICD-10-CM | POA: Diagnosis present

## 2023-04-24 DIAGNOSIS — J440 Chronic obstructive pulmonary disease with acute lower respiratory infection: Secondary | ICD-10-CM | POA: Diagnosis present

## 2023-04-24 DIAGNOSIS — D751 Secondary polycythemia: Secondary | ICD-10-CM | POA: Diagnosis present

## 2023-04-24 DIAGNOSIS — J441 Chronic obstructive pulmonary disease with (acute) exacerbation: Secondary | ICD-10-CM | POA: Diagnosis present

## 2023-04-24 DIAGNOSIS — E66812 Obesity, class 2: Secondary | ICD-10-CM | POA: Diagnosis present

## 2023-04-24 DIAGNOSIS — I129 Hypertensive chronic kidney disease with stage 1 through stage 4 chronic kidney disease, or unspecified chronic kidney disease: Secondary | ICD-10-CM | POA: Diagnosis present

## 2023-04-24 DIAGNOSIS — Z515 Encounter for palliative care: Secondary | ICD-10-CM | POA: Diagnosis not present

## 2023-04-24 DIAGNOSIS — Z6839 Body mass index (BMI) 39.0-39.9, adult: Secondary | ICD-10-CM

## 2023-04-24 DIAGNOSIS — E785 Hyperlipidemia, unspecified: Secondary | ICD-10-CM | POA: Diagnosis present

## 2023-04-24 DIAGNOSIS — N1411 Contrast-induced nephropathy: Secondary | ICD-10-CM | POA: Diagnosis present

## 2023-04-24 DIAGNOSIS — L89891 Pressure ulcer of other site, stage 1: Secondary | ICD-10-CM | POA: Diagnosis present

## 2023-04-24 DIAGNOSIS — Z79890 Hormone replacement therapy (postmenopausal): Secondary | ICD-10-CM

## 2023-04-24 DIAGNOSIS — R809 Proteinuria, unspecified: Secondary | ICD-10-CM | POA: Diagnosis not present

## 2023-04-24 DIAGNOSIS — Z7951 Long term (current) use of inhaled steroids: Secondary | ICD-10-CM

## 2023-04-24 DIAGNOSIS — Z841 Family history of disorders of kidney and ureter: Secondary | ICD-10-CM

## 2023-04-24 DIAGNOSIS — Z9049 Acquired absence of other specified parts of digestive tract: Secondary | ICD-10-CM

## 2023-04-24 DIAGNOSIS — I10 Essential (primary) hypertension: Secondary | ICD-10-CM | POA: Diagnosis present

## 2023-04-24 DIAGNOSIS — F17290 Nicotine dependence, other tobacco product, uncomplicated: Secondary | ICD-10-CM | POA: Diagnosis present

## 2023-04-24 DIAGNOSIS — F419 Anxiety disorder, unspecified: Secondary | ICD-10-CM | POA: Diagnosis present

## 2023-04-24 DIAGNOSIS — T508X5A Adverse effect of diagnostic agents, initial encounter: Secondary | ICD-10-CM | POA: Diagnosis not present

## 2023-04-24 DIAGNOSIS — R34 Anuria and oliguria: Secondary | ICD-10-CM | POA: Diagnosis not present

## 2023-04-24 DIAGNOSIS — Z79899 Other long term (current) drug therapy: Secondary | ICD-10-CM

## 2023-04-24 DIAGNOSIS — Z90710 Acquired absence of both cervix and uterus: Secondary | ICD-10-CM

## 2023-04-24 LAB — CBC WITH DIFFERENTIAL/PLATELET
Abs Immature Granulocytes: 1.65 10*3/uL — ABNORMAL HIGH (ref 0.00–0.07)
Basophils Absolute: 0.2 10*3/uL — ABNORMAL HIGH (ref 0.0–0.1)
Basophils Relative: 1 %
Eosinophils Absolute: 0 10*3/uL (ref 0.0–0.5)
Eosinophils Relative: 0 %
HCT: 38.1 % (ref 36.0–46.0)
Hemoglobin: 12.7 g/dL (ref 12.0–15.0)
Immature Granulocytes: 5 %
Lymphocytes Relative: 4 %
Lymphs Abs: 1.4 10*3/uL (ref 0.7–4.0)
MCH: 30.5 pg (ref 26.0–34.0)
MCHC: 33.3 g/dL (ref 30.0–36.0)
MCV: 91.4 fL (ref 80.0–100.0)
Monocytes Absolute: 2 10*3/uL — ABNORMAL HIGH (ref 0.1–1.0)
Monocytes Relative: 6 %
Neutro Abs: 28 10*3/uL — ABNORMAL HIGH (ref 1.7–7.7)
Neutrophils Relative %: 84 %
Platelets: 309 10*3/uL (ref 150–400)
RBC: 4.17 MIL/uL (ref 3.87–5.11)
RDW: 15 % (ref 11.5–15.5)
Smear Review: NORMAL
WBC: 33.4 10*3/uL — ABNORMAL HIGH (ref 4.0–10.5)
nRBC: 0.1 % (ref 0.0–0.2)

## 2023-04-24 LAB — TROPONIN I (HIGH SENSITIVITY)
Troponin I (High Sensitivity): 139 ng/L (ref <18)
Troponin I (High Sensitivity): 113 ng/L (ref <18)

## 2023-04-24 LAB — BLOOD GAS, ARTERIAL
Acid-Base Excess: 0.2 mmol/L (ref 0.0–2.0)
Acid-base deficit: 1.5 mmol/L (ref 0.0–2.0)
Bicarbonate: 27.6 mmol/L (ref 20.0–28.0)
Bicarbonate: 29.9 mmol/L — ABNORMAL HIGH (ref 20.0–28.0)
FIO2: 70 %
FIO2: 60 %
MECHVT: 450 mL
MECHVT: 450 mL
Mechanical Rate: 20
Mechanical Rate: 18
O2 Saturation: 93.2 %
O2 Saturation: 91.7 %
PEEP: 10 cmH2O
PEEP: 10 cmH2O
Patient temperature: 37
Patient temperature: 37
pCO2 arterial: 66 mm[Hg] (ref 32–48)
pCO2 arterial: 73 mm[Hg] (ref 32–48)
pH, Arterial: 7.23 — ABNORMAL LOW (ref 7.35–7.45)
pH, Arterial: 7.22 — ABNORMAL LOW (ref 7.35–7.45)
pO2, Arterial: 66 mm[Hg] — ABNORMAL LOW (ref 83–108)
pO2, Arterial: 63 mm[Hg] — ABNORMAL LOW (ref 83–108)

## 2023-04-24 LAB — BLOOD GAS, VENOUS
Acid-Base Excess: 0.6 mmol/L (ref 0.0–2.0)
Acid-base deficit: 2.7 mmol/L — ABNORMAL HIGH (ref 0.0–2.0)
Bicarbonate: 31.3 mmol/L — ABNORMAL HIGH (ref 20.0–28.0)
Bicarbonate: 28.2 mmol/L — ABNORMAL HIGH (ref 20.0–28.0)
Delivery systems: POSITIVE
O2 Saturation: 66.2 %
O2 Saturation: 87.3 %
Patient temperature: 37
Patient temperature: 37
pCO2, Ven: 82 mm[Hg] (ref 44–60)
pCO2, Ven: 81 mm[Hg] (ref 44–60)
pH, Ven: 7.19 — CL (ref 7.25–7.43)
pH, Ven: 7.15 — CL (ref 7.25–7.43)
pO2, Ven: 39 mm[Hg] (ref 32–45)
pO2, Ven: 59 mm[Hg] — ABNORMAL HIGH (ref 32–45)

## 2023-04-24 LAB — COMPREHENSIVE METABOLIC PANEL
ALT: 33 U/L (ref 0–44)
AST: 40 U/L (ref 15–41)
Albumin: 3.4 g/dL — ABNORMAL LOW (ref 3.5–5.0)
Alkaline Phosphatase: 94 U/L (ref 38–126)
Anion gap: 11 (ref 5–15)
BUN: 59 mg/dL — ABNORMAL HIGH (ref 8–23)
CO2: 28 mmol/L (ref 22–32)
Calcium: 9.3 mg/dL (ref 8.9–10.3)
Chloride: 95 mmol/L — ABNORMAL LOW (ref 98–111)
Creatinine, Ser: 1.27 mg/dL — ABNORMAL HIGH (ref 0.44–1.00)
GFR, Estimated: 46 mL/min — ABNORMAL LOW (ref >60)
Glucose, Bld: 313 mg/dL — ABNORMAL HIGH (ref 70–99)
Potassium: 4.1 mmol/L (ref 3.5–5.1)
Sodium: 134 mmol/L — ABNORMAL LOW (ref 135–145)
Total Bilirubin: 0.7 mg/dL (ref 0.0–1.2)
Total Protein: 7.8 g/dL (ref 6.5–8.1)

## 2023-04-24 LAB — URINALYSIS, W/ REFLEX TO CULTURE (INFECTION SUSPECTED)
Bacteria, UA: NONE SEEN
Bilirubin Urine: NEGATIVE
Glucose, UA: 50 mg/dL — AB
Hgb urine dipstick: NEGATIVE
Ketones, ur: NEGATIVE mg/dL
Leukocytes,Ua: NEGATIVE
Nitrite: NEGATIVE
Protein, ur: 100 mg/dL — AB
Specific Gravity, Urine: 1.016 (ref 1.005–1.030)
pH: 5 (ref 5.0–8.0)

## 2023-04-24 LAB — PROTIME-INR
INR: 1.1 (ref 0.8–1.2)
Prothrombin Time: 14.3 s (ref 11.4–15.2)

## 2023-04-24 LAB — PROCALCITONIN: Procalcitonin: 1.2 ng/mL

## 2023-04-24 LAB — RESP PANEL BY RT-PCR (RSV, FLU A&B, COVID)  RVPGX2
Influenza A by PCR: POSITIVE — AB
Influenza B by PCR: NEGATIVE
Resp Syncytial Virus by PCR: NEGATIVE
SARS Coronavirus 2 by RT PCR: NEGATIVE

## 2023-04-24 LAB — BRAIN NATRIURETIC PEPTIDE: B Natriuretic Peptide: 436.2 pg/mL — ABNORMAL HIGH (ref 0.0–100.0)

## 2023-04-24 LAB — D-DIMER, QUANTITATIVE: D-Dimer, Quant: 2.13 ug{FEU}/mL — ABNORMAL HIGH (ref 0.00–0.50)

## 2023-04-24 LAB — GLUCOSE, CAPILLARY: Glucose-Capillary: 341 mg/dL — ABNORMAL HIGH (ref 70–99)

## 2023-04-24 LAB — STREP PNEUMONIAE URINARY ANTIGEN: Strep Pneumo Urinary Antigen: NEGATIVE

## 2023-04-24 LAB — MRSA NEXT GEN BY PCR, NASAL: MRSA by PCR Next Gen: NOT DETECTED

## 2023-04-24 LAB — LACTIC ACID, PLASMA
Lactic Acid, Venous: 1.7 mmol/L (ref 0.5–1.9)
Lactic Acid, Venous: 1.2 mmol/L (ref 0.5–1.9)

## 2023-04-24 LAB — HIV ANTIBODY (ROUTINE TESTING W REFLEX): HIV Screen 4th Generation wRfx: NONREACTIVE

## 2023-04-24 MED ORDER — FENTANYL CITRATE PF 50 MCG/ML IJ SOSY
25.0000 ug | PREFILLED_SYRINGE | INTRAMUSCULAR | Status: DC | PRN
  Administered 2023-04-24: 25 ug via INTRAVENOUS
  Filled 2023-04-24: qty 1

## 2023-04-24 MED ORDER — FENTANYL CITRATE PF 50 MCG/ML IJ SOSY
25.0000 ug | PREFILLED_SYRINGE | INTRAMUSCULAR | Status: DC | PRN
  Administered 2023-04-24: 50 ug via INTRAVENOUS
  Filled 2023-04-24: qty 2
  Filled 2023-04-24: qty 1

## 2023-04-24 MED ORDER — ROCURONIUM BROMIDE 10 MG/ML (PF) SYRINGE
150.0000 mg | PREFILLED_SYRINGE | Freq: Once | INTRAVENOUS | Status: AC
Start: 2023-04-24 — End: 2023-04-24

## 2023-04-24 MED ORDER — ROCURONIUM BROMIDE 10 MG/ML (PF) SYRINGE
PREFILLED_SYRINGE | INTRAVENOUS | Status: AC
  Administered 2023-04-24: 150 mg via INTRAVENOUS
  Filled 2023-04-24: qty 20

## 2023-04-24 MED ORDER — SODIUM CHLORIDE 0.9 % IV SOLN
2.0000 g | Freq: Once | INTRAVENOUS | Status: AC
  Administered 2023-04-24: 2 g via INTRAVENOUS
  Filled 2023-04-24: qty 20

## 2023-04-24 MED ORDER — POLYETHYLENE GLYCOL 3350 17 G PO PACK
17.0000 g | PACK | Freq: Every day | ORAL | Status: DC
  Administered 2023-04-25 – 2023-04-26 (×2): 17 g
  Filled 2023-04-24 (×2): qty 1

## 2023-04-24 MED ORDER — KETAMINE HCL 50 MG/5ML IJ SOSY
150.0000 mg | PREFILLED_SYRINGE | Freq: Once | INTRAMUSCULAR | Status: AC
  Administered 2023-04-24: 150 mg via INTRAVENOUS

## 2023-04-24 MED ORDER — LACTATED RINGERS IV BOLUS
1000.0000 mL | Freq: Once | INTRAVENOUS | Status: AC
  Administered 2023-04-24: 1000 mL via INTRAVENOUS

## 2023-04-24 MED ORDER — NOREPINEPHRINE 4 MG/250ML-% IV SOLN
0.0000 ug/min | INTRAVENOUS | Status: DC
  Administered 2023-04-24: 19 ug/min via INTRAVENOUS
  Administered 2023-04-24: 7 ug/min via INTRAVENOUS
  Filled 2023-04-24: qty 250

## 2023-04-24 MED ORDER — MIDAZOLAM HCL 2 MG/2ML IJ SOLN
4.0000 mg | INTRAMUSCULAR | Status: DC | PRN
  Administered 2023-04-24 – 2023-04-25 (×2): 4 mg via INTRAVENOUS
  Filled 2023-04-24 (×2): qty 4

## 2023-04-24 MED ORDER — OSELTAMIVIR PHOSPHATE 6 MG/ML PO SUSR
30.0000 mg | Freq: Two times a day (BID) | ORAL | Status: DC
  Administered 2023-04-25: 30 mg
  Filled 2023-04-24: qty 5
  Filled 2023-04-24: qty 12.5

## 2023-04-24 MED ORDER — FENTANYL 2500MCG IN NS 250ML (10MCG/ML) PREMIX INFUSION
INTRAVENOUS | Status: AC
Start: 2023-04-24 — End: 2023-04-24
  Administered 2023-04-24: 25 ug/h via INTRAVENOUS
  Filled 2023-04-24: qty 250

## 2023-04-24 MED ORDER — POLYETHYLENE GLYCOL 3350 17 G PO PACK: 17.0000 g | PACK | Freq: Every day | ORAL | Status: DC | PRN

## 2023-04-24 MED ORDER — DOCUSATE SODIUM 50 MG/5ML PO LIQD
100.0000 mg | Freq: Two times a day (BID) | ORAL | Status: DC
Start: 2023-04-24 — End: 2023-05-01
  Administered 2023-04-24 – 2023-04-30 (×5): 100 mg
  Filled 2023-04-24 (×7): qty 10

## 2023-04-24 MED ORDER — ACETAMINOPHEN 325 MG PO TABS
650.0000 mg | ORAL_TABLET | ORAL | Status: DC | PRN
  Administered 2023-04-25 (×2): 650 mg
  Filled 2023-04-24 (×2): qty 2

## 2023-04-24 MED ORDER — METHYLPREDNISOLONE SODIUM SUCC 40 MG IJ SOLR
40.0000 mg | INTRAMUSCULAR | Status: DC
  Administered 2023-04-25: 40 mg via INTRAVENOUS
  Filled 2023-04-24: qty 1

## 2023-04-24 MED ORDER — VANCOMYCIN HCL 2000 MG/400ML IV SOLN
2000.0000 mg | Freq: Once | INTRAVENOUS | Status: AC
  Administered 2023-04-24: 2000 mg via INTRAVENOUS
  Filled 2023-04-24 (×2): qty 400

## 2023-04-24 MED ORDER — DOCUSATE SODIUM 100 MG PO CAPS: 100.0000 mg | ORAL_CAPSULE | Freq: Two times a day (BID) | ORAL | Status: DC | PRN

## 2023-04-24 MED ORDER — KETAMINE HCL 10 MG/ML IJ SOLN
INTRAMUSCULAR | Status: AC
  Filled 2023-04-24: qty 1

## 2023-04-24 MED ORDER — PROPOFOL 1000 MG/100ML IV EMUL
0.0000 ug/kg/min | INTRAVENOUS | Status: DC
  Administered 2023-04-24: 5 ug/kg/min via INTRAVENOUS
  Administered 2023-04-25 (×2): 45 ug/kg/min via INTRAVENOUS
  Administered 2023-04-25 (×2): 35 ug/kg/min via INTRAVENOUS
  Administered 2023-04-25: 45 ug/kg/min via INTRAVENOUS
  Administered 2023-04-25 (×3): 40 ug/kg/min via INTRAVENOUS
  Administered 2023-04-26: 30 ug/kg/min via INTRAVENOUS
  Administered 2023-04-26 (×6): 40 ug/kg/min via INTRAVENOUS
  Administered 2023-04-27 (×5): 30 ug/kg/min via INTRAVENOUS
  Administered 2023-04-27: 40 ug/kg/min via INTRAVENOUS
  Administered 2023-04-28: 30 ug/kg/min via INTRAVENOUS
  Administered 2023-04-28: 40 ug/kg/min via INTRAVENOUS
  Administered 2023-04-28: 45 ug/kg/min via INTRAVENOUS
  Administered 2023-04-28: 40 ug/kg/min via INTRAVENOUS
  Administered 2023-04-28 (×2): 45 ug/kg/min via INTRAVENOUS
  Administered 2023-04-28: 50 ug/kg/min via INTRAVENOUS
  Administered 2023-04-28 – 2023-04-29 (×2): 45 ug/kg/min via INTRAVENOUS
  Administered 2023-04-29 (×2): 50 ug/kg/min via INTRAVENOUS
  Administered 2023-04-29 (×2): 45 ug/kg/min via INTRAVENOUS
  Administered 2023-04-29: 50 ug/kg/min via INTRAVENOUS
  Administered 2023-04-29 (×2): 45 ug/kg/min via INTRAVENOUS
  Administered 2023-04-30 (×4): 35 ug/kg/min via INTRAVENOUS
  Administered 2023-04-30: 45 ug/kg/min via INTRAVENOUS
  Administered 2023-04-30: 35 ug/kg/min via INTRAVENOUS
  Administered 2023-05-01: 30 ug/kg/min via INTRAVENOUS
  Administered 2023-05-01 (×2): 25 ug/kg/min via INTRAVENOUS
  Administered 2023-05-01: 30 ug/kg/min via INTRAVENOUS
  Administered 2023-05-02: 20 ug/kg/min via INTRAVENOUS
  Administered 2023-05-02: 30 ug/kg/min via INTRAVENOUS
  Administered 2023-05-02: 40 ug/kg/min via INTRAVENOUS
  Administered 2023-05-02: 30 ug/kg/min via INTRAVENOUS
  Administered 2023-05-03: 20 ug/kg/min via INTRAVENOUS
  Administered 2023-05-03: 10 ug/kg/min via INTRAVENOUS
  Filled 2023-04-24 (×58): qty 100

## 2023-04-24 MED ORDER — NOREPINEPHRINE 16 MG/250ML-% IV SOLN
0.0000 ug/min | INTRAVENOUS | Status: DC
  Administered 2023-04-25: 25 ug/min via INTRAVENOUS
  Administered 2023-04-25 (×2): 30 ug/min via INTRAVENOUS
  Administered 2023-04-26: 14 ug/min via INTRAVENOUS
  Administered 2023-04-27: 2 ug/min via INTRAVENOUS
  Administered 2023-04-27: 5 ug/min via INTRAVENOUS
  Administered 2023-04-27: 22 ug/min via INTRAVENOUS
  Administered 2023-04-28: 2 ug/min via INTRAVENOUS
  Administered 2023-04-29: 3 ug/min via INTRAVENOUS
  Administered 2023-04-30: 5 ug/min via INTRAVENOUS
  Filled 2023-04-24 (×7): qty 250

## 2023-04-24 MED ORDER — PANTOPRAZOLE SODIUM 40 MG IV SOLR
40.0000 mg | Freq: Every day | INTRAVENOUS | Status: DC
  Administered 2023-04-24 – 2023-05-05 (×12): 40 mg via INTRAVENOUS
  Filled 2023-04-24 (×12): qty 10

## 2023-04-24 MED ORDER — VANCOMYCIN HCL 1250 MG/250ML IV SOLN
1250.0000 mg | INTRAVENOUS | Status: DC
  Filled 2023-04-24: qty 250

## 2023-04-24 MED ORDER — IPRATROPIUM-ALBUTEROL 0.5-2.5 (3) MG/3ML IN SOLN
RESPIRATORY_TRACT | Status: AC
  Administered 2023-04-24: 9 mL via RESPIRATORY_TRACT
  Filled 2023-04-24: qty 9

## 2023-04-24 MED ORDER — NOREPINEPHRINE 4 MG/250ML-% IV SOLN
INTRAVENOUS | Status: AC
Start: 2023-04-24 — End: 2023-04-25
  Filled 2023-04-24: qty 250

## 2023-04-24 MED ORDER — METHYLPREDNISOLONE SODIUM SUCC 125 MG IJ SOLR
125.0000 mg | Freq: Once | INTRAMUSCULAR | Status: AC
  Administered 2023-04-24: 125 mg via INTRAVENOUS
  Filled 2023-04-24: qty 2

## 2023-04-24 MED ORDER — VASOPRESSIN 20 UNITS/100 ML INFUSION FOR SHOCK
0.0000 [IU]/min | INTRAVENOUS | Status: DC
  Administered 2023-04-25 – 2023-04-28 (×8): 0.03 [IU]/min via INTRAVENOUS
  Filled 2023-04-24 (×8): qty 100

## 2023-04-24 MED ORDER — VECURONIUM BROMIDE 10 MG IV SOLR
10.0000 mg | Freq: Once | INTRAVENOUS | Status: AC
  Administered 2023-04-24: 10 mg via INTRAVENOUS
  Filled 2023-04-24: qty 10

## 2023-04-24 MED ORDER — SODIUM CHLORIDE 0.9 % IV SOLN
500.0000 mg | INTRAVENOUS | Status: AC
  Administered 2023-04-25 – 2023-04-28 (×4): 500 mg via INTRAVENOUS
  Filled 2023-04-24 (×4): qty 5

## 2023-04-24 MED ORDER — DOCUSATE SODIUM 50 MG/5ML PO LIQD: 100.0000 mg | Freq: Two times a day (BID) | ORAL | Status: DC

## 2023-04-24 MED ORDER — HEPARIN SODIUM (PORCINE) 5000 UNIT/ML IJ SOLN
5000.0000 [IU] | Freq: Three times a day (TID) | INTRAMUSCULAR | Status: DC
  Administered 2023-04-24 – 2023-04-26 (×6): 5000 [IU] via SUBCUTANEOUS
  Filled 2023-04-24 (×6): qty 1

## 2023-04-24 MED ORDER — SODIUM CHLORIDE 0.9 % IV BOLUS
1000.0000 mL | Freq: Once | INTRAVENOUS | Status: AC
  Administered 2023-04-24: 1000 mL via INTRAVENOUS

## 2023-04-24 MED ORDER — FENTANYL BOLUS VIA INFUSION
25.0000 ug | INTRAVENOUS | Status: DC | PRN
  Administered 2023-04-24 (×3): 50 ug via INTRAVENOUS
  Administered 2023-04-25: 100 ug via INTRAVENOUS
  Administered 2023-04-26: 25 ug via INTRAVENOUS
  Administered 2023-04-26 (×4): 50 ug via INTRAVENOUS
  Administered 2023-04-27: 25 ug via INTRAVENOUS
  Administered 2023-04-27 (×2): 50 ug via INTRAVENOUS
  Administered 2023-04-27: 25 ug via INTRAVENOUS
  Administered 2023-04-27: 100 ug via INTRAVENOUS
  Administered 2023-04-27 – 2023-05-02 (×6): 50 ug via INTRAVENOUS
  Administered 2023-05-03 (×3): 100 ug via INTRAVENOUS
  Administered 2023-05-03: 25 ug via INTRAVENOUS

## 2023-04-24 MED ORDER — FENTANYL CITRATE PF 50 MCG/ML IJ SOSY
25.0000 ug | PREFILLED_SYRINGE | Freq: Once | INTRAMUSCULAR | Status: AC
  Administered 2023-04-24: 25 ug via INTRAVENOUS

## 2023-04-24 MED ORDER — IOHEXOL 350 MG/ML SOLN
75.0000 mL | Freq: Once | INTRAVENOUS | Status: AC | PRN
  Administered 2023-04-24: 75 mL via INTRAVENOUS

## 2023-04-24 MED ORDER — FENTANYL 2500MCG IN NS 250ML (10MCG/ML) PREMIX INFUSION
25.0000 ug/h | INTRAVENOUS | Status: DC
  Administered 2023-04-24: 25 ug/h via INTRAVENOUS
  Administered 2023-04-25: 200 ug/h via INTRAVENOUS
  Administered 2023-04-26: 175 ug/h via INTRAVENOUS
  Administered 2023-04-26: 150 ug/h via INTRAVENOUS
  Administered 2023-04-27: 175 ug/h via INTRAVENOUS
  Administered 2023-04-27: 150 ug/h via INTRAVENOUS
  Administered 2023-04-28 (×2): 200 ug/h via INTRAVENOUS
  Administered 2023-04-29: 170 ug/h via INTRAVENOUS
  Administered 2023-04-30: 150 ug/h via INTRAVENOUS
  Administered 2023-04-30: 175 ug/h via INTRAVENOUS
  Administered 2023-05-01: 120 ug/h via INTRAVENOUS
  Administered 2023-05-03: 125 ug/h via INTRAVENOUS
  Administered 2023-05-04: 75 ug/h via INTRAVENOUS
  Filled 2023-04-24 (×14): qty 250

## 2023-04-24 MED ORDER — IPRATROPIUM-ALBUTEROL 0.5-2.5 (3) MG/3ML IN SOLN
3.0000 mL | RESPIRATORY_TRACT | Status: DC
  Administered 2023-04-24 – 2023-05-03 (×52): 3 mL via RESPIRATORY_TRACT
  Filled 2023-04-24 (×53): qty 3

## 2023-04-24 MED ORDER — SODIUM CHLORIDE 0.9 % IV SOLN
500.0000 mg | Freq: Once | INTRAVENOUS | Status: AC
  Administered 2023-04-24: 500 mg via INTRAVENOUS
  Filled 2023-04-24: qty 5

## 2023-04-24 MED ORDER — CHLORHEXIDINE GLUCONATE CLOTH 2 % EX PADS
6.0000 | MEDICATED_PAD | Freq: Every day | CUTANEOUS | Status: DC
  Administered 2023-04-25 – 2023-05-10 (×16): 6 via TOPICAL

## 2023-04-24 MED ORDER — SODIUM CHLORIDE 0.9 % IV SOLN
2.0000 g | INTRAVENOUS | Status: DC
  Filled 2023-04-24: qty 20

## 2023-04-24 MED ORDER — ALBUTEROL SULFATE (2.5 MG/3ML) 0.083% IN NEBU
10.0000 mg | INHALATION_SOLUTION | Freq: Once | RESPIRATORY_TRACT | Status: AC
  Administered 2023-04-24: 10 mg via RESPIRATORY_TRACT
  Filled 2023-04-24: qty 12

## 2023-04-24 MED ORDER — POLYETHYLENE GLYCOL 3350 17 G PO PACK
17.0000 g | PACK | Freq: Every day | ORAL | Status: DC
Start: 2023-04-25 — End: 2023-04-24

## 2023-04-24 NOTE — Progress Notes (Signed)
eLink Physician-Brief Progress Note Patient Name: Sarah Norton DOB: 07/08/1955 MRN: [id number]   Date of Service  04/24/2023  HPI/Events of Note  68 year old female with a history of COPD with chronic respiratory failure requiring 2 L of oxygen who presents with acute hypercapnic respiratory failure in the setting of COPD exacerbation and influenza A.  Intubated 2/13.  On examination, vital signs within normal limits.  Patient is on a norepinephrine infusion, fentanyl and propofol.  Results consistent with persistent hypercapnic hypoxic respiratory failure.  Leukocytosis to 33.  Influenza A positive.  CT chest with diffuse airspace disease with bibasilar consolidations right greater than left.  eICU Interventions  Scheduled methylprednisolone, empiric antibiotics and oseltamavir  Scheduled DuoNebs  Norepinephrine as needed to maintain MAP greater than 65  DVT prophylaxis with heparin GI prophylaxis with pantoprazole     Intervention Category Evaluation Type: New Patient Evaluation  Zadok Holaway 04/24/2023, 10:32 PM

## 2023-04-24 NOTE — ED Notes (Signed)
Pt now normothermic and bear hugger taken off

## 2023-04-24 NOTE — Procedures (Signed)
Central Venous Catheter Placement:TRIPLE LUMEN   Procedure: Insertion of Non-tunneled Central Venous Catheter(36556) with US guidance (16109)   Indication(s) Medication administration and Difficult access  CVP monitoring Patient receiving vesicant or irritant drug.; Patient receiving intravenous therapy for longer than 5 days.; Patient has limited or no vascular access.    Consent Risks of the procedure as well as the alternatives and risks of each were explained to the patient and/or caregiver.  Consent for the procedure was obtained and is signed in the bedside chart  Consent:signed   Anesthesia Topical only with 1% lidocaine    Timeout Verified patient identification, verified procedure, site/side was marked, verified correct patient position, special equipment/implants available, medications/allergies/relevant history reviewed, required imaging and test results available. Patient comfort was obtained.     Sterile Technique Maximal sterile technique including full sterile barrier drape, hand hygiene, sterile gown, sterile gloves, mask, hair covering, sterile ultrasound probe cover (if used).   Hand washing performed prior to starting the procedure.    Procedure Description Area of catheter insertion was cleaned with chlorhexidine and draped in sterile fashion.  With real-time ultrasound guidance a central venous catheter was placed into the right internal jugular vein. Nonpulsatile blood flow and easy flushing noted in all ports.  The catheter was sutured in place and sterile dressing applied.   A triple lumen catheter was placed in LEFT Internal Jugular Vein There was good blood return, catheter caps were placed on lumens, catheter flushed easily, the line was secured and a sterile dressing and BIO-PATCH applied.    Complications/Tolerance None; patient tolerated the procedure well. Chest X-ray is ordered to verify placement     EBL Minimal    Specimen(s) None   Number of Attempts: 1 Complications:none Estimated Blood Loss: none Chest Radiograph indicated and ordered.  Operator: Jalynn Waddell.   Lucie Leather, M.D.  [facility]  Medical Director [facility] Medical Director [facility] Cardio-Pulmonary Department

## 2023-04-24 NOTE — ED Notes (Signed)
Pt cleaned, new brief in place and linens changed. Rectal temp obtained and purewick placed.

## 2023-04-24 NOTE — ED Notes (Addendum)
RT called. Preparation started for intubation

## 2023-04-24 NOTE — ED Triage Notes (Addendum)
Pt to ED via [facility] from home emergency traffic. EMS reports FD arrived and pt was minimally responsive. FD reports oxygen sat of 48% and put her on 25L NRB. Pt on CPAP on arrival. Oxygen sats 86% on CPAP. EMS reports pt more lethargic in route. Boyfriend reports pt has been noncompliant with medications. Pt with strong urine smell and covered in urine on night gown. Pt slow to respond but follows most commands. Pt oxygen on BIPAP 78%. 353 CBG

## 2023-04-24 NOTE — H&P (Signed)
NAME:  Sarah Norton, MRN:  [id number], DOB:  [birth year], LOS: 0 ADMISSION DATE:  04/24/2023, CONSULTATION DATE:  04/24/23 REFERRING MD:  Dr. Rosalia Hammers, CHIEF COMPLAINT:  Acute Respiratory Distress, hypoxia, AMS   Brief Pt Description / Synopsis:  68 y.o female admitted with Acute Metabolic Encephalopathy and Acute Hypoxic and Hypercapnic Respiratory Failure in the setting of Acute COPD Exacerbation due to Influenza A infection and questionable superimposed bacterial pneumonia, failed trial of BiPAP requiring intubation and mechanical ventilation.  History of Present Illness:  Sarah Norton is a 68 year old female with a past medical history significant for COPD on home supplemental oxygen and diabetes mellitus who presented to [facility] ED on 04/24/2023 due to unresponsiveness.  Upon EMS arrival she was noted to be hypoxic with O2 saturations in the 40s for which she was placed on CPAP with improvement in sats to the 80s.  Upon arrival to the ED she remained somnolent but slightly arousable, and given her hypoxia, she was transitioned to BiPAP.  Pt is currently intubated and sedated, and no other history is currently available.   ED Course: Initial Vital Signs: Temperature 96.5 F axillary, pulse 87, respiratory rate 33, blood pressure 136/97, SpO2 78% Significant Labs: Sodium 134, glucose 313, BUN 59, creatinine 1.27, BNP 436, high-sensitivity troponin 139, lactic acid 1.7, WBC 33.4, D-dimer 2.13 VBG: pH 7.19/pCO2 82/pO2 39/bicarb 31.3 Positive for influenza A Urinalysis negative for UTI Imaging Chest X-ray>>IMPRESSION: *Bibasilar heterogeneous opacities, favored to represent multilobar pneumonia. Follow-up to clearing is recommended. CTa Chest>> pending currently Medications Administered: 1 L normal saline bolus, 125 mg Solu-Medrol, IV azithromycin and ceftriaxone, Levophed drip initiated  Despite BiPAP patient's ABG worsened with worsening mental status.  Therefore ED provider elected to intubate.   PCCM is asked to admit for further workup and treatment.  Please see "significant hospital events" section below for full detailed hospital course.   Pertinent  Medical History   Past Medical History:  Diagnosis Date   Arthritis    Asthma    Cataract of both eyes    Complication of anesthesia    woke up during surgery x1   COPD (chronic obstructive pulmonary disease) (HCC)    Coronary artery disease    DDD (degenerative disc disease), lumbar    Diabetes mellitus without complication (HCC)    Dyspnea    DUE TO COPD   Hernia of abdominal wall    History of hiatal hernia    small   Hyperlipidemia    Hypertension    Hypothyroidism    hashimotos throiditis  -thyroidectomy  2000   Occasional tremors    Oxygen desaturation    NOW PT ON 3-4 LITERS [place] DAILY    Micro Data:  2/13: COVID/FLU/RSV PCR>> + Influenza A 2/13: Blood culture x2>> 2/13: Tracheal aspirate>> 2/13: Strep pneumo urinary antigen>> 2/13: Legionella urinary antigen>>  Antimicrobials:   Anti-infectives (From admission, onward)    Start     Dose/Rate Route Frequency Ordered Stop   04/25/23 1200  azithromycin (ZITHROMAX) 500 mg in sodium chloride 0.9 % 250 mL IVPB        500 mg 250 mL/hr over 60 Minutes Intravenous Every 24 hours 04/24/23 1557 04/29/23 1159   04/25/23 1200  cefTRIAXone (ROCEPHIN) 2 g in sodium chloride 0.9 % 100 mL IVPB        2 g 200 mL/hr over 30 Minutes Intravenous Every 24 hours 04/24/23 1557 04/29/23 1159   04/24/23 2200  oseltamivir (TAMIFLU) 6 MG/ML suspension 30 mg  30 mg Per Tube 2 times daily 04/24/23 1559 04/29/23 2159   04/24/23 1245  cefTRIAXone (ROCEPHIN) 2 g in sodium chloride 0.9 % 100 mL IVPB        2 g 200 mL/hr over 30 Minutes Intravenous Once 04/24/23 1244 04/24/23 1407   04/24/23 1245  azithromycin (ZITHROMAX) 500 mg in sodium chloride 0.9 % 250 mL IVPB        500 mg 250 mL/hr over 60 Minutes Intravenous  Once 04/24/23 1244 04/24/23 1513       Significant  Hospital Events: Including procedures, antibiotic start and stop dates in addition to other pertinent events   2/13: Presented to ED with AMS, acute respiratory distress and hypoxia.  Initially trialed on BiPAP but with worsening ABG and mental status.  ED provider intubated.  PCCM asked to admit.  Interim History / Subjective:  As outlined above in significant hospital events section  Objective   Blood pressure 91/61, pulse 65, temperature (!) 96.2 F (35.7 C), resp. rate 20, SpO2 (!) 88%.    Vent Mode: PRVC FiO2 (%):  [100 %] 100 % Set Rate:  [20 bmp] 20 bmp Vt Set:  [450 mL] 450 mL PEEP:  [12 cmH20] 12 cmH20 Pressure Support:  [8 cmH20] 8 cmH20 Plateau Pressure:  [26 cmH20] 26 cmH20  No intake or output data in the 24 hours ending 04/24/23 1601 There were no vitals filed for this visit.  Examination: General: Critically ill-appearing female, laying in bed, intubated and sedated, no acute distress HENT: Atraumatic, normocephalic, neck supple, difficult to assess JVD due to body habitus, orally intubated Lungs: Coarse with expiratory wheezing throughout, even, nonlabored, synchronous with the vent Cardiovascular: Regular rate and rhythm, S1-S2, no murmurs, rubs, gallops Abdomen: Obese, soft, nontender, nondistended, no guarding or rebound tenderness, bowel sounds positive x 4 Extremities: Normal bulk and tone, no deformities, trace edema to bilateral lower extremities Neuro: Sedated, currently not following commands or withdrawing from pain, pupils PERRLA at 3 mm bilaterally GU: Foley catheter in place  Resolved Hospital Problem list     Assessment & Plan:   #Acute Hypoxic & Hypercapnic Respiratory Failure in the setting of AECOPD, Influenza infection, and questionable superimposed bacterial pneumonia -Full vent support, implement lung protective strategies -Plateau pressures less than 30 cm H20 -Wean FiO2 & PEEP as tolerated to maintain O2 sats >92% -Follow intermittent  Chest X-ray & ABG as needed -Spontaneous Breathing Trials when respiratory parameters met and mental status permits -Implement VAP Bundle -Bronchodilators -IV Steroids -ABX as above -Tamiflu -Diuresis as BP and renal function permits ~ holding due to shock -CTa Chest is pending to rule out PE  #Shock: Suspect Septic #Elevated Troponin in setting of demand ischemia vs NSTEMI -Continuous cardiac monitoring -Maintain MAP >65 -IV fluids -Vasopressors as needed to maintain MAP goal -Trend lactic acid until normalized -Trend HS Troponin until peaked -Echocardiogram pending -Diuresis as BP and renal function permits ~ holding due to shock  #Meets SIRS Criteria (RR 34, WBC 33.4) #Severe Sepsis in setting of Influenza A infection and questionable superimposed bacterial pneumonia -Monitor fever curve -Trend WBC's & Procalcitonin -Follow cultures as above -Continue empiric Azithromycin, Ceftriaxone, and Tamiflu pending cultures & sensitivities  #Acute Kidney Injury #Mild Hyponatremia -Monitor I&O's / urinary output -Follow BMP -Ensure adequate renal perfusion -Avoid nephrotoxic agents as able -Replace electrolytes as indicated ~ Pharmacy following for assistance with electrolyte replacement -IV fluids  #Diabetes Mellitus -CBG's q4h; Target range of 140 to 180 -SSI -Follow ICU Hypo/Hyperglycemia protocol  #  Acute Metabolic Encephalopathy, suspect CO2 Narcosis -Treatment of metabolic derangements, sepsis, and hypercapnia as outlined above -Maintain a RASS goal of 0 to -1 -Fentanyl and Propofol as needed to maintain RASS goal -Avoid sedating medications as able -Daily wake up assessment -Obtain CT Head -Check UDS    Patient is critically ill with severe acute hypoxic and hypercapnic respiratory failure requiring mechanical ventilation.  Given her underlying COPD at baseline, suspect she may have to be difficult to liberate from mechanical ventilation.    Best Practice (right  click and "Reselect all SmartList Selections" daily)   Diet/type: NPO DVT prophylaxis: prophylactic heparin  GI prophylaxis: PPI Lines: N/A Foley:  Yes, and it is still needed Code Status:  full code Last date of multidisciplinary goals of care discussion [N/A]  2/13: Pt's daughter updated at bedside on plan of care.  Labs   CBC: Recent Labs  Lab 04/24/23 1240  WBC 33.4*  NEUTROABS 28.0*  HGB 12.7  HCT 38.1  MCV 91.4  PLT 309    Basic Metabolic Panel: Recent Labs  Lab 04/24/23 1240  NA 134*  K 4.1  CL 95*  CO2 28  GLUCOSE 313*  BUN 59*  CREATININE 1.27*  CALCIUM 9.3   GFR: CrCl cannot be calculated (Unknown ideal weight.). Recent Labs  Lab 04/24/23 1240  WBC 33.4*  LATICACIDVEN 1.7    Liver Function Tests: Recent Labs  Lab 04/24/23 1240  AST 40  ALT 33  ALKPHOS 94  BILITOT 0.7  PROT 7.8  ALBUMIN 3.4*   No results for input(s): "LIPASE", "AMYLASE" in the last 168 hours. No results for input(s): "AMMONIA" in the last 168 hours.  ABG    Component Value Date/Time   HCO3 28.2 (H) 04/24/2023 1425   ACIDBASEDEF 2.7 (H) 04/24/2023 1425   O2SAT 87.3 04/24/2023 1425     Coagulation Profile: Recent Labs  Lab 04/24/23 1240  INR 1.1    Cardiac Enzymes: No results for input(s): "CKTOTAL", "CKMB", "CKMBINDEX", "TROPONINI" in the last 168 hours.  HbA1C: Hemoglobin A1C  Date/Time Value Ref Range Status  11/16/2013 04:11 PM 6.5 (H) 4.2 - 6.3 % Final    Comment:    The American Diabetes Association recommends that a primary goal of therapy should be <7% and that physicians should reevaluate the treatment regimen in patients with HbA1c values consistently >8%.    Hgb A1c MFr Bld  Date/Time Value Ref Range Status  05/04/2020 04:33 AM 8.0 (H) 4.8 - 5.6 % Final    Comment:    (NOTE) Pre diabetes:          5.7%-6.4%  Diabetes:              >6.4%  Glycemic control for   <7.0% adults with diabetes     CBG: No results for input(s): "GLUCAP"  in the last 168 hours.  Review of Systems:   Unable to assess due to AMS/intubation/sedation   Past Medical History:  She,  has a past medical history of Arthritis, Asthma, Cataract of both eyes, Complication of anesthesia, COPD (chronic obstructive pulmonary disease) (HCC), Coronary artery disease, DDD (degenerative disc disease), lumbar, Diabetes mellitus without complication (HCC), Dyspnea, Hernia of abdominal wall, History of hiatal hernia, Hyperlipidemia, Hypertension, Hypothyroidism, Occasional tremors, and Oxygen desaturation.   Surgical History:   Past Surgical History:  Procedure Laterality Date   ABDOMINAL HYSTERECTOMY     CHOLECYSTECTOMY     COLONOSCOPY WITH PROPOFOL N/A 09/08/2014   Procedure: COLONOSCOPY WITH PROPOFOL;  Surgeon: Ebony Hail  Servando Snare, MD;  Location: [facility] ENDOSCOPY;  Service: Endoscopy;  Laterality: N/A;   GANGLION CYST EXCISION Right 08/15/2020   Procedure: Right index finger cyst removal;  Surgeon: Kennedy Bucker, MD;  Location: [facility] ORS;  Service: Orthopedics;  Laterality: Right;   HIATAL HERNIA REPAIR  2000   OTHER SURGICAL HISTORY     vocal cord surgery due to injury during thyroid surgery   parathyroidectomy  2000   THYROIDECTOMY     TONSILLECTOMY     TUMOR REMOVAL Right Leg     Social History:   reports that she quit smoking about 7 years ago. Her smoking use included cigarettes. She started smoking about 55 years ago. She has a 48 pack-year smoking history. She has never used smokeless tobacco. She reports that she does not drink alcohol and does not use drugs.   Family History:  Her family history includes Breast cancer in her sister; Kidney disease in her mother.   Allergies No Known Allergies   Home Medications  Prior to Admission medications   Medication Sig Start Date End Date Taking? Authorizing Provider  MOUNJARO 10 MG/0.5ML Pen Inject 10 mg into the skin once a week. 03/27/23  Yes [provider]  albuterol (VENTOLIN HFA) 108 (90 Base)  MCG/ACT inhaler Inhale 2 puffs into the lungs every 4 (four) hours as needed for wheezing or shortness of breath.    [provider]  allopurinol (ZYLOPRIM) 100 MG tablet Take 100 mg by mouth every morning.    [provider]  amLODipine (NORVASC) 5 MG tablet Take 5 mg by mouth every morning. 07/23/20   [provider]  atorvastatin (LIPITOR) 40 MG tablet Take 40 mg by mouth every morning.    [provider]  benazepril (LOTENSIN) 40 MG tablet Take 40 mg by mouth every morning.    [provider]  chlorhexidine (PERIDEX) 0.12 % solution Use as directed 15 mLs in the mouth or throat as needed.    [provider]  ELIQUIS 5 MG TABS tablet Take 5 mg by mouth 2 (two) times daily.    [provider]  fluticasone (FLONASE) 50 MCG/ACT nasal spray Place 2 sprays into both nostrils daily as needed for allergies. 03/31/20   [provider]  fluticasone-salmeterol (ADVAIR HFA) 308-65 MCG/ACT inhaler Inhale 2 puffs by mouth twice daily 07/07/21   Erin Fulling, MD  furosemide (LASIX) 80 MG tablet Take 80 mg by mouth every morning.    [provider]  gabapentin (NEURONTIN) 600 MG tablet Take 600 mg by mouth 3 (three) times daily.    [provider]  hydrochlorothiazide (HYDRODIURIL) 25 MG tablet Take 25 mg by mouth every morning.    [provider]  HYDROcodone-acetaminophen (NORCO) 5-325 MG tablet Take 1 tablet by mouth every 6 (six) hours as needed for moderate pain. 08/15/20   Kennedy Bucker, MD  ibuprofen (ADVIL) 800 MG tablet Take 800 mg by mouth every 8 (eight) hours as needed for pain. 07/21/20   [provider]  indomethacin (INDOCIN) 25 MG capsule Take 25 mg by mouth daily.    [provider]  LANTUS SOLOSTAR 100 UNIT/ML Solostar Pen Inject 80 Units into the skin every morning. 05/16/20   [provider]  levothyroxine (SYNTHROID) 200 MCG tablet Take 200 mcg by mouth daily before  breakfast.    [provider]  Melatonin 10 MG TABS Take 10 mg by mouth at bedtime. Patient not taking: Reported on 08/15/2020    [provider]  methocarbamol (ROBAXIN) 500 MG tablet Take 1-2 tablets by mouth every 6 (six) hours as needed for muscle spasms. 07/18/20   [provider]  Metoprolol Tartrate 75 MG TABS Take 1 tablet by mouth 2 (two) times daily.    [provider]  montelukast (SINGULAIR) 10 MG tablet Take 10 mg by mouth every morning.    [provider]  [facility] powder Apply 1 Application topically 2 (two) times daily.    [provider]  olopatadine (PATANOL) 0.1 % ophthalmic solution Place 1 drop into both eyes daily.    [provider]  OXYGEN Inhale 3-4 L into the lungs daily.    [provider]  promethazine (PHENERGAN) 25 MG tablet Take 25 mg by mouth every 6 (six) hours as needed for nausea or vomiting.    [provider]  psyllium (REGULOID) 0.52 g capsule Take 0.52 g by mouth 2 (two) times daily.    [provider]  Tiotropium Bromide Monohydrate (SPIRIVA RESPIMAT) 1.25 MCG/ACT AERS Inhale 2 puffs into the lungs daily for 30 doses. 10/03/20 11/02/20  Erin Fulling, MD  tiZANidine (ZANAFLEX) 4 MG tablet Take 4 mg by mouth at bedtime.    [provider]  tretinoin (RETIN-A) 0.05 % cream Apply to face qhs, wash off qam 02/18/22   Deirdre Evener, MD  TRULICITY 0.75 MG/0.5ML SOPN Inject 0.75 mg into the skin once a week. wednesday 07/08/20   [provider]  VALERIAN ROOT PO Take 1,200 mg by mouth at bedtime.    [provider]     Critical care time: 60 minutes     Harlon Ditty, AGACNP-BC [facility] Prefer epic messenger for cross cover needs If after hours, please call E-link

## 2023-04-24 NOTE — Progress Notes (Signed)
CODE SEPSIS - PHARMACY COMMUNICATION  **Broad Spectrum Antibiotics should be administered within 1 hour of Sepsis diagnosis**  Time Code Sepsis Called/Page Received: 1244  Antibiotics Ordered: ceftriaxone and azithromycin  Time of 1st antibiotic administration: 1312  Additional action taken by pharmacy: None  If necessary, Name of Provider/Nurse Contacted: None    Sarah Norton ,PharmD Clinical Pharmacist  04/24/2023  12:46 PM

## 2023-04-24 NOTE — ED Notes (Addendum)
Temp 94.5. Bear warmer applied

## 2023-04-24 NOTE — ED Provider Notes (Addendum)
[facility] Provider Note    Event Date/Time   First MD Initiated Contact with Patient 04/24/23 1238     (approximate)   History   Respiratory Distress   HPI  Sarah Norton is a 68 year old female with history of diabetes, COPD presenting to the emergency department for evaluation of unresponsiveness.  On fire arrival, sat of 40%, transition to CPAP with sats in the 80s.  Patient somnolent, unable to be aroused but unable to provide history.     Physical Exam   Triage Vital Signs: ED Triage Vitals  Encounter Vitals Group     BP 04/24/23 1241 (!) 136/97     Systolic BP Percentile --      Diastolic BP Percentile --      Pulse Rate 04/24/23 1237 87     Resp 04/24/23 1237 (!) 33     Temp 04/24/23 1241 (!) 96.5 F (35.8 C)     Temp Source 04/24/23 1241 Axillary     SpO2 04/24/23 1237 (!) 78 %     Weight --      Height --      Head Circumference --      Peak Flow --      Pain Score --      Pain Loc --      Pain Education --      Exclude from Growth Chart --     Most recent vital signs: Vitals:   04/24/23 1535 04/24/23 1540  BP: (!) 82/50 91/61  Pulse: 73 65  Resp: 20 20  Temp: (!) 94.5 F (34.7 C) (!) 96.2 F (35.7 C)  SpO2: (!) 87% (!) 88%     General: Somnolent, on BiPAP mask, minimal vocalization CV:  Regular rate, good peripheral perfusion.  Resp:  Unlabored respirations, lung sounds diminished bilaterally with occasional wheezing, some crackles noted Abd:  Nondistended, soft, no appreciable tenderness Neuro:  No gross facial asymmetry, requires frequent prompting, but able to follow basic commands   ED Results / Procedures / Treatments   Labs (all labs ordered are listed, but only abnormal results are displayed) Labs Reviewed  RESP PANEL BY RT-PCR (RSV, FLU A&B, COVID)  RVPGX2 - Abnormal; Notable for the following components:      Result Value   Influenza A by PCR POSITIVE (*)    All other components within normal limits   COMPREHENSIVE METABOLIC PANEL - Abnormal; Notable for the following components:   Sodium 134 (*)    Chloride 95 (*)    Glucose, Bld 313 (*)    BUN 59 (*)    Creatinine, Ser 1.27 (*)    Albumin 3.4 (*)    GFR, Estimated 46 (*)    All other components within normal limits  CBC WITH DIFFERENTIAL/PLATELET - Abnormal; Notable for the following components:   WBC 33.4 (*)    Neutro Abs 28.0 (*)    Monocytes Absolute 2.0 (*)    Basophils Absolute 0.2 (*)    Abs Immature Granulocytes 1.65 (*)    All other components within normal limits  URINALYSIS, W/ REFLEX TO CULTURE (INFECTION SUSPECTED) - Abnormal; Notable for the following components:   Color, Urine YELLOW (*)    APPearance HAZY (*)    Glucose, UA 50 (*)    Protein, ur 100 (*)    All other components within normal limits  BLOOD GAS, VENOUS - Abnormal; Notable for the following components:   pH, Ven 7.19 (*)    pCO2,  Ven 82 (*)    Bicarbonate 31.3 (*)    All other components within normal limits  D-DIMER, QUANTITATIVE - Abnormal; Notable for the following components:   D-Dimer, Quant 2.13 (*)    All other components within normal limits  BRAIN NATRIURETIC PEPTIDE - Abnormal; Notable for the following components:   B Natriuretic Peptide 436.2 (*)    All other components within normal limits  BLOOD GAS, VENOUS - Abnormal; Notable for the following components:   pH, Ven 7.15 (*)    pCO2, Ven 81 (*)    pO2, Ven 59 (*)    Bicarbonate 28.2 (*)    Acid-base deficit 2.7 (*)    All other components within normal limits  TROPONIN I (HIGH SENSITIVITY) - Abnormal; Notable for the following components:   Troponin I (High Sensitivity) 139 (*)    All other components within normal limits  CULTURE, BLOOD (ROUTINE X 2)  CULTURE, BLOOD (ROUTINE X 2)  CULTURE, RESPIRATORY W GRAM STAIN  LACTIC ACID, PLASMA  PROTIME-INR  LACTIC ACID, PLASMA  BLOOD GAS, ARTERIAL  STREP PNEUMONIAE URINARY ANTIGEN  LEGIONELLA PNEUMOPHILA SEROGP 1 UR AG   PROCALCITONIN  HIV ANTIBODY (ROUTINE TESTING W REFLEX)  TROPONIN I (HIGH SENSITIVITY)     EKG EKG independently reviewed interpreted by myself (ER attending) demonstrates:  EKG demonstrates A-fib at a rate of 91, QRS 89, QTc 488, no acute ST changes  RADIOLOGY Imaging independently reviewed and interpreted by myself demonstrates:  CXR with new right basilar consolidation on my review, formal radiology read pending Postintubation x-Jamine Wingate with adequate ET tube positioning  PROCEDURES:  Critical Care performed: Yes, see critical care procedure note(s)  CRITICAL CARE Performed by: Trinna Post   Total critical care time: 50 minutes  Critical care time was exclusive of separately billable procedures and treating other patients.  Critical care was necessary to treat or prevent imminent or life-threatening deterioration.  Critical care was time spent personally by me on the following activities: development of treatment plan with patient and/or surrogate as well as nursing, discussions with consultants, evaluation of patient's response to treatment, examination of patient, obtaining history from patient or surrogate, ordering and performing treatments and interventions, ordering and review of laboratory studies, ordering and review of radiographic studies, pulse oximetry and re-evaluation of patient's condition.   Procedure Name: Intubation Date/Time: 04/24/2023 4:02 PM  Performed by: Trinna Post, MDGrade View: Grade II Tube size: 7.5 mm Number of attempts: 1 Airway Equipment and Method: Rigid stylet Placement Confirmation: ETT inserted through vocal cords under direct vision and CO2 detector Secured at: 22 cm Comments: Preoxygenation with BiPAP       MEDICATIONS ORDERED IN ED: Medications  iohexol (OMNIPAQUE) 350 MG/ML injection 75 mL (has no administration in time range)  ketamine (KETALAR) 10 MG/ML injection (0 mg  Hold 04/24/23 1530)  norepinephrine (LEVOPHED) 4mg  in  (0.016 mg/mL) premix infusion (11 mcg/min Intravenous Rate/Dose Change 04/24/23 1541)  docusate sodium (COLACE) capsule 100 mg (has no administration in time range)  polyethylene glycol (MIRALAX / GLYCOLAX) packet 17 g (has no administration in time range)  heparin injection 5,000 Units (has no administration in time range)  pantoprazole (PROTONIX) injection 40 mg (has no administration in time range)  acetaminophen (TYLENOL) tablet 650 mg (has no administration in time range)  azithromycin (ZITHROMAX) 500 mg in sodium chloride 0.9 % 250 mL IVPB (has no administration in time range)  cefTRIAXone (ROCEPHIN) 2 g in sodium chloride 0.9 % 100 mL IVPB (has no  administration in time range)  oseltamivir (TAMIFLU) 6 MG/ML suspension 30 mg (has no administration in time range)  ipratropium-albuterol (DUONEB) 0.5-2.5 (3) MG/3ML nebulizer solution 3 mL (has no administration in time range)  methylPREDNISolone sodium succinate (SOLU-MEDROL) 40 mg/mL injection 40 mg (has no administration in time range)  ipratropium-albuterol (DUONEB) 0.5-2.5 (3) MG/3ML nebulizer solution (9 mLs  Given 04/24/23 1243)  cefTRIAXone (ROCEPHIN) 2 g in sodium chloride 0.9 % 100 mL IVPB (0 g Intravenous Stopped 04/24/23 1407)  azithromycin (ZITHROMAX) 500 mg in sodium chloride 0.9 % 250 mL IVPB (0 mg Intravenous Stopped 04/24/23 1513)  methylPREDNISolone sodium succinate (SOLU-MEDROL) 125 mg/2 mL injection 125 mg (125 mg Intravenous Given 04/24/23 1424)  ketamine 50 mg in normal saline 5 mL (10 mg/mL) syringe (150 mg Intravenous Given 04/24/23 1522)  rocuronium (ZEMURON) injection 150 mg (150 mg Intravenous Given 04/24/23 1522)  sodium chloride 0.9 % bolus 1,000 mL (1,000 mLs Intravenous New Bag/Given 04/24/23 1541)     IMPRESSION / MDM / ASSESSMENT AND PLAN / ED COURSE  I reviewed the triage vital signs and the nursing notes.  Differential diagnosis includes, but is not limited to, pneumonia, pneumothorax, CHF exacerbation, COPD  exacerbation, ACS, PE  Patient's presentation is most consistent with acute presentation with potential threat to life or bodily function.  68 year old female presenting somnolent with shortness of breath and hypoxia.  Labs with significant leukocytosis WC of 33.4.  CMP with hyperglycemia without evidence of DKA.  VBG with respiratory acidosis with pH of 7.19, pCO2 of 82, bicarb of 31.  Troponin elevated at 139, possibly in setting of demand.  D-dimer elevated at 2.13, CTA ordered though suspect likely pneumonia with COPD exacerbation as the etiology of her shortness of breath.  Flu test did return positive.  Given her significant hypoxia, do think patient is appropriate for admission.  Will obtain repeat VBG to evaluate improvement, ultimate plan for admission.  Repeat blood gas stable to worsened with pH of 7.15 and pCO2 of 81.  On reevaluation, patient remains significantly somnolent,, possibly slightly worsened from my initial evaluation.  In the setting of this, did proceed with intubation as above.  Patient did have some postintubation hypotension for which she was ordered for some IV fluids and started on Levophed.  I suspect that this is likely related to her RSI unless reflective of acute septic shock.  I am concerned about volume overload with an elevated BNP at 436, do think large-volume fluid resuscitation would likely be detrimental to this patient, so we will hold off on 30 cc/kg of fluids.  Sepsis reassessment performed at 1530  Case was discussed with hospitalist team.  They will evaluate the patient for anticipated admission.    FINAL CLINICAL IMPRESSION(S) / ED DIAGNOSES   Final diagnoses:  Community acquired pneumonia of right lower lobe of lung  COPD exacerbation (HCC)  Acute respiratory failure with hypoxia and hypercapnia (HCC)     Rx / DC Orders   ED Discharge Orders     None        Note:  This document was prepared using Dragon voice recognition software and may  include unintentional dictation errors.   Trinna Post, MD 04/24/23 1605    Trinna Post, MD 04/24/23 [phone]

## 2023-04-24 NOTE — Consult Note (Signed)
PHARMACY CONSULT NOTE - ELECTROLYTES  Pharmacy Consult for Electrolyte Monitoring and Replacement   Recent Labs:    CrCl cannot be calculated (Unknown ideal weight.). Potassium (mmol/L)  Date Value  04/24/2023 4.1  11/16/2013 3.5   Magnesium (mg/dL)  Date Value  40/98/1191 1.0 (L)   Calcium (mg/dL)  Date Value  47/82/9562 9.3   Calcium, Total (mg/dL)  Date Value  13/10/6576 9.1   Albumin (g/dL)  Date Value  46/96/2952 3.4 (L)  11/16/2013 3.8   Sodium (mmol/L)  Date Value  04/24/2023 134 (L)  11/16/2013 133 (L)    Assessment  Sarah Norton is a 68 y.o. female presenting with respiratory failure and unresponsive. PMH significant for DM and COPD. Patient Pharmacy has been consulted to monitor and replace electrolytes.  Diet: npo Pertinent medications: propofol, norepi  Goal of Therapy: Electrolytes WNL  Plan:  No replacement indicated at this time. Check renal panel and Mg with AM labs  Thank you for allowing pharmacy to be a part of this patient's care.  Dorianne Perret Rodriguez-Guzman PharmD, BCPS 04/24/2023 4:36 PM

## 2023-04-24 NOTE — Sepsis Progress Note (Signed)
eLink is following this Code Sepsis.

## 2023-04-24 NOTE — ED Notes (Signed)
Boyfriend at bedside

## 2023-04-24 NOTE — ED Notes (Signed)
Family at bedside and updated

## 2023-04-24 NOTE — Consult Note (Signed)
Pharmacy Antibiotic Note  Sarah Norton is a 68 y.o. female admitted on 04/24/2023 with pneumonia.  Pharmacy has been consulted for Vancomycin dosing.  Plan: Vancomycin 2000mg  IV x 1 then, Vancomycin  1250 mg mg IV Q 24 hrs. Goal AUC 400-550. Expected AUC: 507.6 Expected Cmin: 13.5 SCr used: 1.27  2. Azithromycin and ceftriaxone per MD orders  Weight: 125 kg (275 lb 9.2 oz)  Temp (24hrs), Avg:98.2 F (36.8 C), Min:94.5 F (34.7 C), Max:99.7 F (37.6 C)  Recent Labs  Lab 04/24/23 1240 04/24/23 1738  WBC 33.4*  --   CREATININE 1.27*  --   LATICACIDVEN 1.7 1.2    CrCl cannot be calculated (Unknown ideal weight.).    No Known Allergies  Antimicrobials this admission: ceftriaxone 2/13 >>  azithromycin 2/13 >>  Vancomycin 2/13  Dose adjustments this admission: n/a  Microbiology results: 2/13 MRSA PCR: pending collection  Thank you for allowing pharmacy to be a part of this patient's care.  Royce Stegman Rodriguez-Guzman PharmD, BCPS 04/24/2023 9:06 PM

## 2023-04-24 NOTE — Plan of Care (Addendum)
Assessment  Increased WOB, +air trapping On vent, Low BP on propofol  Plan RR decreased to 14, flow volume loops returned to baseline Switch to fentanyl infusion, PRN versed LR bolus

## 2023-04-24 NOTE — ED Notes (Signed)
X-ray at bedside

## 2023-04-25 ENCOUNTER — Inpatient Hospital Stay (HOSPITAL_COMMUNITY)
Admit: 2023-04-25 | Discharge: 2023-04-25 | Disposition: A | Discharge status: Home / Self Care | Payer: 59 | Attending: Pulmonary Disease | Admitting: Pulmonary Disease

## 2023-04-25 DIAGNOSIS — R7989 Other specified abnormal findings of blood chemistry: Secondary | ICD-10-CM | POA: Diagnosis not present

## 2023-04-25 DIAGNOSIS — J80 Acute respiratory distress syndrome: Secondary | ICD-10-CM | POA: Diagnosis not present

## 2023-04-25 DIAGNOSIS — J09X2 Influenza due to identified novel influenza A virus with other respiratory manifestations: Secondary | ICD-10-CM | POA: Diagnosis not present

## 2023-04-25 DIAGNOSIS — A419 Sepsis, unspecified organism: Secondary | ICD-10-CM | POA: Diagnosis not present

## 2023-04-25 DIAGNOSIS — I2489 Other forms of acute ischemic heart disease: Secondary | ICD-10-CM | POA: Diagnosis not present

## 2023-04-25 DIAGNOSIS — R6521 Severe sepsis with septic shock: Secondary | ICD-10-CM

## 2023-04-25 DIAGNOSIS — J441 Chronic obstructive pulmonary disease with (acute) exacerbation: Secondary | ICD-10-CM | POA: Diagnosis not present

## 2023-04-25 DIAGNOSIS — I480 Paroxysmal atrial fibrillation: Secondary | ICD-10-CM

## 2023-04-25 LAB — CBC
HCT: 32.1 % — ABNORMAL LOW (ref 36.0–46.0)
Hemoglobin: 10.6 g/dL — ABNORMAL LOW (ref 12.0–15.0)
MCH: 30.8 pg (ref 26.0–34.0)
MCHC: 33 g/dL (ref 30.0–36.0)
MCV: 93.3 fL (ref 80.0–100.0)
Platelets: 342 10*3/uL (ref 150–400)
RBC: 3.44 MIL/uL — ABNORMAL LOW (ref 3.87–5.11)
RDW: 15.9 % — ABNORMAL HIGH (ref 11.5–15.5)
WBC: 43.9 10*3/uL — ABNORMAL HIGH (ref 4.0–10.5)
nRBC: 0.5 % — ABNORMAL HIGH (ref 0.0–0.2)

## 2023-04-25 LAB — BLOOD GAS, ARTERIAL
Acid-base deficit: 4.6 mmol/L — ABNORMAL HIGH (ref 0.0–2.0)
Acid-base deficit: 2.9 mmol/L — ABNORMAL HIGH (ref 0.0–2.0)
Acid-base deficit: 7.4 mmol/L — ABNORMAL HIGH (ref 0.0–2.0)
Acid-base deficit: 5.8 mmol/L — ABNORMAL HIGH (ref 0.0–2.0)
Acid-base deficit: 4.7 mmol/L — ABNORMAL HIGH (ref 0.0–2.0)
Bicarbonate: 27.3 mmol/L (ref 20.0–28.0)
Bicarbonate: 26.4 mmol/L (ref 20.0–28.0)
Bicarbonate: 21.9 mmol/L (ref 20.0–28.0)
Bicarbonate: 24.2 mmol/L (ref 20.0–28.0)
Bicarbonate: 25 mmol/L (ref 20.0–28.0)
FIO2: 50 %
FIO2: 50 %
FIO2: 60 %
FIO2: 60 %
FIO2: 60 %
MECHVT: 500 mL
MECHVT: 580 mL
MECHVT: 500 mL
MECHVT: 500 mL
MECHVT: 500 mL
Mechanical Rate: 16
Mechanical Rate: 20
O2 Saturation: 92.7 %
O2 Saturation: 96 %
O2 Saturation: 98.7 %
O2 Saturation: 98.6 %
O2 Saturation: 97.6 %
PEEP: 10 cmH2O
PEEP: 10 cmH2O
PEEP: 12 cmH2O
PEEP: 12 cmH2O
PEEP: 12 cmH2O
Patient temperature: 37
Patient temperature: 37
Patient temperature: 37
Patient temperature: 37
Patient temperature: 37
RATE: 14 {breaths}/min
RATE: 20 {breaths}/min
RATE: 22 {breaths}/min
pCO2 arterial: 88 mm[Hg] (ref 32–48)
pCO2 arterial: 66 mm[Hg] (ref 32–48)
pCO2 arterial: 60 mm[Hg] — ABNORMAL HIGH (ref 32–48)
pCO2 arterial: 68 mm[Hg] (ref 32–48)
pCO2 arterial: 67 mm[Hg] (ref 32–48)
pH, Arterial: 7.1 — CL (ref 7.35–7.45)
pH, Arterial: 7.21 — ABNORMAL LOW (ref 7.35–7.45)
pH, Arterial: 7.17 — CL (ref 7.35–7.45)
pH, Arterial: 7.16 — CL (ref 7.35–7.45)
pH, Arterial: 7.18 — CL (ref 7.35–7.45)
pO2, Arterial: 61 mm[Hg] — ABNORMAL LOW (ref 83–108)
pO2, Arterial: 67 mm[Hg] — ABNORMAL LOW (ref 83–108)
pO2, Arterial: 92 mm[Hg] (ref 83–108)
pO2, Arterial: 89 mm[Hg] (ref 83–108)
pO2, Arterial: 77 mm[Hg] — ABNORMAL LOW (ref 83–108)

## 2023-04-25 LAB — ECHOCARDIOGRAM COMPLETE
AR max vel: 1.92 cm2
AV Area VTI: 2.1 cm2
AV Area mean vel: 1.97 cm2
AV Mean grad: 18 mm[Hg]
AV Peak grad: 36.6 mm[Hg]
Ao pk vel: 3.03 m/s
Area-P 1/2: 2.71 cm2
Height: 68 in
MV VTI: 2.02 cm2
S' Lateral: 2.5 cm
Weight: 3865.99 [oz_av]

## 2023-04-25 LAB — BASIC METABOLIC PANEL
Anion gap: 14 (ref 5–15)
Anion gap: 16 — ABNORMAL HIGH (ref 5–15)
BUN: 69 mg/dL — ABNORMAL HIGH (ref 8–23)
BUN: 70 mg/dL — ABNORMAL HIGH (ref 8–23)
CO2: 24 mmol/L (ref 22–32)
CO2: 23 mmol/L (ref 22–32)
Calcium: 8 mg/dL — ABNORMAL LOW (ref 8.9–10.3)
Calcium: 8.1 mg/dL — ABNORMAL LOW (ref 8.9–10.3)
Chloride: 98 mmol/L (ref 98–111)
Chloride: 96 mmol/L — ABNORMAL LOW (ref 98–111)
Creatinine, Ser: 2.82 mg/dL — ABNORMAL HIGH (ref 0.44–1.00)
Creatinine, Ser: 3.05 mg/dL — ABNORMAL HIGH (ref 0.44–1.00)
GFR, Estimated: 18 mL/min — ABNORMAL LOW (ref >60)
GFR, Estimated: 16 mL/min — ABNORMAL LOW (ref >60)
Glucose, Bld: 171 mg/dL — ABNORMAL HIGH (ref 70–99)
Glucose, Bld: 129 mg/dL — ABNORMAL HIGH (ref 70–99)
Potassium: 3.5 mmol/L (ref 3.5–5.1)
Potassium: 3.7 mmol/L (ref 3.5–5.1)
Sodium: 136 mmol/L (ref 135–145)
Sodium: 135 mmol/L (ref 135–145)

## 2023-04-25 LAB — MAGNESIUM
Magnesium: 2 mg/dL (ref 1.7–2.4)
Magnesium: 1.9 mg/dL (ref 1.7–2.4)

## 2023-04-25 LAB — RENAL FUNCTION PANEL
Albumin: 2.6 g/dL — ABNORMAL LOW (ref 3.5–5.0)
Anion gap: 11 (ref 5–15)
BUN: 64 mg/dL — ABNORMAL HIGH (ref 8–23)
CO2: 25 mmol/L (ref 22–32)
Calcium: 8.2 mg/dL — ABNORMAL LOW (ref 8.9–10.3)
Chloride: 97 mmol/L — ABNORMAL LOW (ref 98–111)
Creatinine, Ser: 2.38 mg/dL — ABNORMAL HIGH (ref 0.44–1.00)
GFR, Estimated: 22 mL/min — ABNORMAL LOW (ref >60)
Glucose, Bld: 474 mg/dL — ABNORMAL HIGH (ref 70–99)
Phosphorus: 4.2 mg/dL (ref 2.5–4.6)
Potassium: 4.2 mmol/L (ref 3.5–5.1)
Sodium: 133 mmol/L — ABNORMAL LOW (ref 135–145)

## 2023-04-25 LAB — RESPIRATORY PANEL BY PCR

## 2023-04-25 LAB — URINE DRUG SCREEN, QUALITATIVE (ARMC ONLY)
Amphetamines, Ur Screen: NOT DETECTED
Barbiturates, Ur Screen: NOT DETECTED
Benzodiazepine, Ur Scrn: POSITIVE — AB
Cannabinoid 50 Ng, Ur ~~LOC~~: NOT DETECTED
Cocaine Metabolite,Ur ~~LOC~~: NOT DETECTED
MDMA (Ecstasy)Ur Screen: NOT DETECTED
Methadone Scn, Ur: NOT DETECTED
Opiate, Ur Screen: NOT DETECTED
Phencyclidine (PCP) Ur S: NOT DETECTED
Tricyclic, Ur Screen: NOT DETECTED

## 2023-04-25 LAB — GLUCOSE, CAPILLARY
Glucose-Capillary: 462 mg/dL — ABNORMAL HIGH (ref 70–99)
Glucose-Capillary: 387 mg/dL — ABNORMAL HIGH (ref 70–99)
Glucose-Capillary: 365 mg/dL — ABNORMAL HIGH (ref 70–99)
Glucose-Capillary: 366 mg/dL — ABNORMAL HIGH (ref 70–99)
Glucose-Capillary: 282 mg/dL — ABNORMAL HIGH (ref 70–99)
Glucose-Capillary: 227 mg/dL — ABNORMAL HIGH (ref 70–99)
Glucose-Capillary: 151 mg/dL — ABNORMAL HIGH (ref 70–99)
Glucose-Capillary: 130 mg/dL — ABNORMAL HIGH (ref 70–99)
Glucose-Capillary: 151 mg/dL — ABNORMAL HIGH (ref 70–99)
Glucose-Capillary: 128 mg/dL — ABNORMAL HIGH (ref 70–99)
Glucose-Capillary: 204 mg/dL — ABNORMAL HIGH (ref 70–99)

## 2023-04-25 LAB — TRIGLYCERIDES: Triglycerides: 219 mg/dL — ABNORMAL HIGH (ref <150)

## 2023-04-25 LAB — PROCALCITONIN: Procalcitonin: 19.8 ng/mL

## 2023-04-25 LAB — COOXEMETRY PANEL
Carboxyhemoglobin: 1.2 % (ref 0.5–1.5)
Methemoglobin: 1.2 % (ref 0.0–1.5)
O2 Saturation: 96.8 %
Total hemoglobin: 10.2 g/dL — ABNORMAL LOW (ref 12.0–16.0)
Total oxygen content: 94.6 %

## 2023-04-25 LAB — HEMOGLOBIN A1C
Hgb A1c MFr Bld: 6.5 % — ABNORMAL HIGH (ref 4.8–5.6)
Mean Plasma Glucose: 139.85 mg/dL

## 2023-04-25 LAB — PHOSPHORUS: Phosphorus: 2.6 mg/dL (ref 2.5–4.6)

## 2023-04-25 MED ORDER — LACTATED RINGERS IV BOLUS
1000.0000 mL | Freq: Once | INTRAVENOUS | Status: AC
  Administered 2023-04-25: 1000 mL via INTRAVENOUS

## 2023-04-25 MED ORDER — INSULIN ASPART 100 UNIT/ML IJ SOLN
0.0000 [IU] | INTRAMUSCULAR | Status: DC
  Administered 2023-04-25: 7 [IU] via SUBCUTANEOUS
  Administered 2023-04-26: 15 [IU] via SUBCUTANEOUS
  Administered 2023-04-26: 7 [IU] via SUBCUTANEOUS
  Administered 2023-04-26: 4 [IU] via SUBCUTANEOUS
  Administered 2023-04-26: 7 [IU] via SUBCUTANEOUS
  Administered 2023-04-26: 4 [IU] via SUBCUTANEOUS
  Administered 2023-04-26 – 2023-04-27 (×3): 7 [IU] via SUBCUTANEOUS
  Administered 2023-04-27: 4 [IU] via SUBCUTANEOUS
  Administered 2023-04-27 (×2): 7 [IU] via SUBCUTANEOUS
  Administered 2023-04-27 – 2023-04-28 (×3): 4 [IU] via SUBCUTANEOUS
  Administered 2023-04-28 (×3): 7 [IU] via SUBCUTANEOUS
  Administered 2023-04-28: 4 [IU] via SUBCUTANEOUS
  Administered 2023-04-29: 7 [IU] via SUBCUTANEOUS
  Administered 2023-04-29 (×3): 4 [IU] via SUBCUTANEOUS
  Administered 2023-04-29: 7 [IU] via SUBCUTANEOUS
  Administered 2023-04-29: 3 [IU] via SUBCUTANEOUS
  Administered 2023-04-30 (×3): 4 [IU] via SUBCUTANEOUS
  Administered 2023-04-30: 3 [IU] via SUBCUTANEOUS
  Administered 2023-04-30: 4 [IU] via SUBCUTANEOUS
  Administered 2023-05-01: 7 [IU] via SUBCUTANEOUS
  Administered 2023-05-01 – 2023-05-02 (×5): 4 [IU] via SUBCUTANEOUS
  Administered 2023-05-02: 3 [IU] via SUBCUTANEOUS
  Administered 2023-05-02 (×4): 4 [IU] via SUBCUTANEOUS
  Administered 2023-05-03: 11 [IU] via SUBCUTANEOUS
  Administered 2023-05-03 (×3): 7 [IU] via SUBCUTANEOUS
  Administered 2023-05-03: 4 [IU] via SUBCUTANEOUS
  Administered 2023-05-04 (×2): 7 [IU] via SUBCUTANEOUS
  Administered 2023-05-04: 3 [IU] via SUBCUTANEOUS
  Administered 2023-05-04: 4 [IU] via SUBCUTANEOUS
  Administered 2023-05-04: 11 [IU] via SUBCUTANEOUS
  Administered 2023-05-04 (×2): 7 [IU] via SUBCUTANEOUS
  Administered 2023-05-05: 4 [IU] via SUBCUTANEOUS
  Administered 2023-05-05: 3 [IU] via SUBCUTANEOUS
  Administered 2023-05-06 – 2023-05-07 (×4): 4 [IU] via SUBCUTANEOUS
  Administered 2023-05-07: 15 [IU] via SUBCUTANEOUS
  Administered 2023-05-07: 4 [IU] via SUBCUTANEOUS
  Administered 2023-05-07: 7 [IU] via SUBCUTANEOUS
  Administered 2023-05-07: 11 [IU] via SUBCUTANEOUS
  Administered 2023-05-07 – 2023-05-08 (×2): 4 [IU] via SUBCUTANEOUS
  Administered 2023-05-08 (×4): 7 [IU] via SUBCUTANEOUS
  Administered 2023-05-09 (×3): 4 [IU] via SUBCUTANEOUS
  Administered 2023-05-10: 7 [IU] via SUBCUTANEOUS
  Administered 2023-05-10: 3 [IU] via SUBCUTANEOUS
  Administered 2023-05-10 – 2023-05-11 (×2): 4 [IU] via SUBCUTANEOUS
  Administered 2023-05-11: 3 [IU] via SUBCUTANEOUS
  Administered 2023-05-12: 4 [IU] via SUBCUTANEOUS
  Administered 2023-05-12: 7 [IU] via SUBCUTANEOUS
  Administered 2023-05-12: 3 [IU] via SUBCUTANEOUS
  Administered 2023-05-12: 4 [IU] via SUBCUTANEOUS
  Administered 2023-05-13 (×2): 3 [IU] via SUBCUTANEOUS
  Administered 2023-05-13: 4 [IU] via SUBCUTANEOUS
  Administered 2023-05-13: 3 [IU] via SUBCUTANEOUS
  Administered 2023-05-14: 4 [IU] via SUBCUTANEOUS
  Filled 2023-04-25 (×86): qty 1

## 2023-04-25 MED ORDER — POTASSIUM CHLORIDE 20 MEQ PO PACK
20.0000 meq | PACK | Freq: Once | ORAL | Status: AC
  Administered 2023-04-25: 20 meq
  Filled 2023-04-25: qty 1

## 2023-04-25 MED ORDER — OSELTAMIVIR PHOSPHATE 6 MG/ML PO SUSR
30.0000 mg | Freq: Every day | ORAL | Status: DC
  Administered 2023-04-26: 30 mg
  Filled 2023-04-25: qty 5

## 2023-04-25 MED ORDER — LACTATED RINGERS IV BOLUS
500.0000 mL | Freq: Once | INTRAVENOUS | Status: AC
  Administered 2023-04-25: 500 mL via INTRAVENOUS

## 2023-04-25 MED ORDER — FREE WATER
30.0000 mL | Status: DC
  Administered 2023-04-25 – 2023-05-04 (×53): 30 mL

## 2023-04-25 MED ORDER — INSULIN REGULAR(HUMAN) IN NACL 100-0.9 UT/100ML-% IV SOLN
INTRAVENOUS | Status: DC
  Administered 2023-04-25: 14 [IU]/h via INTRAVENOUS
  Filled 2023-04-25 (×2): qty 100

## 2023-04-25 MED ORDER — HYDROCORTISONE SOD SUC (PF) 100 MG IJ SOLR
50.0000 mg | Freq: Four times a day (QID) | INTRAMUSCULAR | Status: DC
  Administered 2023-04-25 – 2023-04-29 (×16): 50 mg via INTRAVENOUS
  Filled 2023-04-25 (×16): qty 2

## 2023-04-25 MED ORDER — VITAL 1.5 CAL PO LIQD
1000.0000 mL | ORAL | Status: DC
  Administered 2023-04-25 – 2023-04-27 (×3): 1000 mL

## 2023-04-25 MED ORDER — INSULIN ASPART 100 UNIT/ML IJ SOLN
0.0000 [IU] | INTRAMUSCULAR | Status: DC
  Administered 2023-04-25 (×2): 9 [IU] via SUBCUTANEOUS
  Filled 2023-04-25 (×2): qty 1

## 2023-04-25 MED ORDER — ORAL CARE MOUTH RINSE
15.0000 mL | OROMUCOSAL | Status: DC
  Administered 2023-04-25 – 2023-05-05 (×118): 15 mL via OROMUCOSAL

## 2023-04-25 MED ORDER — VITAL HIGH PROTEIN PO LIQD: 1000.0000 mL | ORAL | Status: DC

## 2023-04-25 MED ORDER — LEVOTHYROXINE SODIUM 50 MCG PO TABS
175.0000 ug | ORAL_TABLET | Freq: Every day | ORAL | Status: DC
  Administered 2023-04-26 – 2023-05-04 (×9): 175 ug
  Filled 2023-04-25 (×9): qty 2

## 2023-04-25 MED ORDER — ORAL CARE MOUTH RINSE: 15.0000 mL | OROMUCOSAL | Status: DC | PRN

## 2023-04-25 MED ORDER — THIAMINE MONONITRATE 100 MG PO TABS
100.0000 mg | ORAL_TABLET | Freq: Every day | ORAL | Status: AC
  Administered 2023-04-25 – 2023-05-01 (×7): 100 mg
  Filled 2023-04-25 (×7): qty 1

## 2023-04-25 MED ORDER — VANCOMYCIN VARIABLE DOSE PER UNSTABLE RENAL FUNCTION (PHARMACIST DOSING): Status: DC

## 2023-04-25 MED ORDER — SODIUM CHLORIDE 0.9 % IV SOLN
2.0000 g | INTRAVENOUS | Status: DC
  Administered 2023-04-25 – 2023-04-26 (×2): 2 g via INTRAVENOUS
  Filled 2023-04-25 (×2): qty 12.5

## 2023-04-25 MED ORDER — ADULT MULTIVITAMIN W/MINERALS CH
1.0000 | ORAL_TABLET | Freq: Every day | ORAL | Status: DC
  Administered 2023-04-25 – 2023-04-27 (×3): 1
  Filled 2023-04-25 (×4): qty 1

## 2023-04-25 NOTE — Progress Notes (Signed)
Yellow ring removed from pt's right middle finger. Ring remains at beside on a specimen cup.

## 2023-04-25 NOTE — Inpatient Diabetes Management (Addendum)
Inpatient Diabetes Program Recommendations  AACE/ADA: New Consensus Statement on Inpatient Glycemic Control (2015)  Target Ranges:  Prepandial:   less than 140 mg/dL      Peak postprandial:   less than 180 mg/dL (1-2 hours)      Critically ill patients:  140 - 180 mg/dL    Latest Reference Range & Units 04/25/23 04:11  Sodium 135 - 145 mmol/L 133 (L)  Potassium 3.5 - 5.1 mmol/L 4.2  Chloride 98 - 111 mmol/L 97 (L)  CO2 22 - 32 mmol/L 25  Glucose 70 - 99 mg/dL 161 (H)  BUN 8 - 23 mg/dL 64 (H)  Creatinine 0.96 - 1.00 mg/dL 0.45 (H)  Calcium 8.9 - 10.3 mg/dL 8.2 (L)  Anion gap 5 - 15  11  (L): Data is abnormally low (H): Data is abnormally high  Latest Reference Range & Units 04/24/23 21:45 04/25/23 04:34 04/25/23 07:41  Glucose-Capillary 70 - 99 mg/dL  409 mg Solumedrol @1424  341 (H) 462 (H)  9 units Novolog  387 (H)  9 units Novolog   (H): Data is abnormally high   Admit with: Acute hypoxic hypercarbic respiratory failure in the setting of COPD exacerbation and Influenza A   History: DM2  Home DM Meds: Mounjaro 10 mg Qweek  Current Orders: IV Insulin Drip     Novolog Sensitive Correction Scale/ SSI (0-9 units) Q4 hours    Solucortef 100 mg Q6H Current A1c Pending Intubated IV Insulin Started at 11am    --Will follow patient during hospitalization--  Ambrose Finland RN, MSN, CDCES Diabetes Coordinator Inpatient Glycemic Control Team Team Pager: [phone] (8a-5p)

## 2023-04-25 NOTE — Progress Notes (Signed)
*  PRELIMINARY RESULTS* Echocardiogram 2D Echocardiogram has been performed.  Carolyne Fiscal 04/25/2023, 10:59 AM

## 2023-04-25 NOTE — Progress Notes (Signed)
Harlon Ditty NP was notified pt only had 40 ml of urine in the last 6 hrs. Pt was bladder scan which showed zero urine on her bladder.

## 2023-04-25 NOTE — Plan of Care (Signed)
  Problem: Clinical Measurements: Goal: Ability to maintain clinical measurements within normal limits will improve Outcome: Progressing Goal: Will remain free from infection Outcome: Progressing Goal: Diagnostic test results will improve Outcome: Progressing   Problem: Nutrition: Goal: Adequate nutrition will be maintained Outcome: Progressing   Problem: Pain Managment: Goal: General experience of comfort will improve and/or be controlled Outcome: Progressing   Problem: Skin Integrity: Goal: Risk for impaired skin integrity will decrease Outcome: Progressing   Problem: Coping: Goal: Ability to adjust to condition or change in health will improve Outcome: Progressing   Problem: Metabolic: Goal: Ability to maintain appropriate glucose levels will improve Outcome: Progressing   Problem: Nutritional: Goal: Maintenance of adequate nutrition will improve Outcome: Progressing

## 2023-04-25 NOTE — Procedures (Signed)
Arterial Catheter Insertion Procedure Note  Sarah Norton  [id number]  1955-06-28  Date:04/25/23  Time:12:39 PM   Provider Performing: Janann Colonel   Procedure: Insertion of Arterial Line (82956) with US guidance (21308)   Indication(s) Blood pressure monitoring and/or need for frequent ABGs  Consent Risks of the procedure as well as the alternatives and risks of each were explained to the patient and/or caregiver.  Consent for the procedure was obtained and is signed in the bedside chart  Anesthesia None  Time Out Verified patient identification, verified procedure, site/side was marked, verified correct patient position, special equipment/implants available, medications/allergies/relevant history reviewed, required imaging and test results available.  Sterile Technique Maximal sterile technique including full sterile barrier drape, hand hygiene, sterile gown, sterile gloves, mask, hair covering, sterile ultrasound probe cover (if used).  Procedure Description Area of catheter insertion was cleaned with chlorhexidine and draped in sterile fashion. With real-time ultrasound guidance an arterial catheter was placed into the left radial artery.  Appropriate arterial tracings confirmed on monitor.    Complications/Tolerance None; patient tolerated the procedure well.  EBL Minimal  Specimen(s) None   Janann Colonel, MD [facility] 04/25/2023 12:40 PM

## 2023-04-25 NOTE — Consult Note (Addendum)
PHARMACY CONSULT NOTE - ELECTROLYTES  Pharmacy Consult for Electrolyte Monitoring and Replacement   Recent Labs:  Height: 5\' 8"  (172.7 cm) Weight: 109.6 kg (241 lb 10 oz) IBW/kg (Calculated) : 63.9 Estimated Creatinine Clearance: 24.4 mL/min (A) (by C-G formula based on SCr of 2.82 mg/dL (H)). Potassium (mmol/L)  Date Value  04/25/2023 3.5  11/16/2013 3.5   Magnesium (mg/dL)  Date Value  16/12/9602 1.9  11/16/2013 1.0 (L)   Calcium (mg/dL)  Date Value  54/11/8117 8.0 (L)   Calcium, Total (mg/dL)  Date Value  14/78/2956 9.1   Albumin (g/dL)  Date Value  21/30/8657 2.6 (L)  11/16/2013 3.8   Phosphorus (mg/dL)  Date Value  84/69/6295 2.6   Sodium (mmol/L)  Date Value  04/25/2023 136  11/16/2013 133 (L)    Assessment  Sarah Norton is a 68 y.o. female presenting with respiratory failure and unresponsive. PMH significant for DM and COPD. Due to high BG started an insulin gtt to target POC 140 - 180. Patient Pharmacy has been consulted to monitor and replace electrolytes.  Diet: npo Pertinent medications: propofol, norepi, vaso  Goal of Therapy: Electrolytes WNL Na seems to run low at baseline  K = 3.5 >> 4.2  Plan:  Replace potassium with 20 mEq Kcl per tube x 1(due to renal function)  Also checking BMP every 4 hours due to insulin gtt to monitor K Check renal panel and Mg with AM labs  Thank you for allowing pharmacy to be a part of this patient's care.  Effie Shy, PharmD Pharmacy Resident  04/25/2023 3:52 PM

## 2023-04-25 NOTE — Progress Notes (Signed)
NAME:  Sarah Norton, MRN:  [id number], DOB:  11/30/1955, LOS: 1 ADMISSION DATE:  04/24/2023  History of Present Illness:  Sarah Norton is a 68 year old female with a past medical history significant for COPD on home supplemental oxygen and diabetes mellitus who presented to [facility] ED on 04/24/2023 due to unresponsiveness.  Upon EMS arrival she was noted to be hypoxic with O2 saturations in the 40s for which she was placed on CPAP with improvement in sats to the 80s.  Upon arrival to the ED she remained somnolent but slightly arousable, and given her hypoxia, she was transitioned to BiPAP.  Pt is currently intubated and sedated, and no other history is currently available.    ED Course: Initial Vital Signs: Temperature 96.5 F axillary, pulse 87, respiratory rate 33, blood pressure 136/97, SpO2 78% Significant Labs: Sodium 134, glucose 313, BUN 59, creatinine 1.27, BNP 436, high-sensitivity troponin 139, lactic acid 1.7, WBC 33.4, D-dimer 2.13 VBG: pH 7.19/pCO2 82/pO2 39/bicarb 31.3 Positive for influenza A Urinalysis negative for UTI Imaging Chest X-ray>>IMPRESSION: *Bibasilar heterogeneous opacities, favored to represent multilobar pneumonia. Follow-up to clearing is recommended. CTa Chest>> pending currently Medications Administered: 1 L normal saline bolus, 125 mg Solu-Medrol, IV azithromycin and ceftriaxone, Levophed drip initiated   Despite BiPAP patient's ABG worsened with worsening mental status.  Therefore ED provider elected to intubate.  PCCM is asked to admit for further workup and treatment.   Please see "significant hospital events" section below for full detailed hospital course.  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   Significant Hospital Events: Including procedures, antibiotic start and stop dates in addition to other pertinent events   04/24/2023 - Admitted to the ICU intubated and MV.   Interim History /  Subjective:  ON: Patient with air trapping, required deeper sedation and 1 dose of paralytic. Left internal jugular placed.   Objective   Blood pressure (!) 133/50, pulse 90, temperature (!) 100.6 F (38.1 C), resp. rate 16, height 5\' 8"  (1.727 m), weight 109.6 kg, SpO2 96%. CVP:  [7 mmHg-11 mmHg] 10 mmHg  Vent Mode: PRVC FiO2 (%):  [50 %-100 %] 50 % Set Rate:  [14 bmp-20 bmp] (P) 16 bmp Vt Set:  [450 mL-580 mL] (P) 580 mL PEEP:  [10 cmH20-12 cmH20] 10 cmH20 Pressure Support:  [8 cmH20] 8 cmH20 Plateau Pressure:  [24 cmH20-26 cmH20] 24 cmH20   Intake/Output Summary (Last 24 hours) at 04/25/2023 0744 Last data filed at 04/25/2023 0703 Gross per 24 hour  Intake 4555.9 ml  Output 375 ml  Net 4180.9 ml   Filed Weights   04/24/23 1637 04/24/23 2129 04/25/23 0500  Weight: 125 kg 109.6 kg 109.6 kg    Examination: General: Intubated, Sedated HENT: Supple neck, reactive pupils Lungs: Diffuse ronchi w/ Expiratory wheezing.  Cardiovascular: Normal S1, Normal S2, RRR  Abdomen: Soft, non tender, non distended, +BS  Extremities: Warm and well perfused   Labs and imaging were reviwed.   Assessment & Plan:  Case of 68 year old female patient with past medical history of COPD with chronic hypoxic respiratory failure on 2 L nasal cannula presenting to [facility] with acute on chronic hypoxic hypercapnic respiratory failure requiring intubation and mechanical ventilation on 02/13 secondary to ARDS, influenza type A with possible superimposed bacterial pneumonia.  # Acute on chronic hypoxic hypercapnic respiratory failure requiring intubation and mechanical ventilation on 04/24/2023 secondary to # ARDS in the setting of influenza type A and  possible superimposed bacterial pneumonia. # COPD exacerbation # Septic shock secondary to the above requiring pressor support # Paroxysmal A-fib on Eliquis # Demand ischemia troponin at 139 # AKI with creatinine 2.38 mg/dL from a baseline of 8.29 mg/dL likely  secondary to septic ATN  Neuro: Propofol and fentanyl for RASS -2 to -3 CPOTless than 2. CVS: Norepinephrine and vasopressin for MAP greater than 65.  Keep holding home Eliquis for now. Lungs/ID: DuoNebs every 4 hours scheduled.  Methylprednisolone 40 mg IV daily.  MRSA swab negative.  Sputum culture pending.  Continue ceftriaxone azithromycin and vancomycin.  Lung protective ventilation currently at 8 cc/kg.  SpO2 goal greater than 90%.  Permissive hypercapnia with pH greater than 7.2. GI PPI for GI prophylaxis.  Start tube feeds.  Laxatives as needed. Endo start insulin drip for POC's 140-180 Renal Avoid nephrotoxic agents. Monitor UOP.  [employer] Eliquis start subcutaneous Heparin.   Best Practice (right click and "Reselect all SmartList Selections" daily)   Diet/type: tubefeeds DVT prophylaxis prophylactic heparin  Pressure ulcer(s): N/A GI prophylaxis: PPI Lines: Central line Foley:  Yes, and it is still needed Code Status:  full code  Last date of multidisciplinary goals of care discussion [04/25/2023]  I spent 60 minutes caring for this patient today, including preparing to see the patient, obtaining a medical history , reviewing a separately obtained history, performing a medically appropriate examination and/or evaluation, counseling and educating the patient/family/caregiver, ordering medications, tests, or procedures, documenting clinical information in the electronic health record, and independently interpreting results (not separately reported/billed) and communicating results to the patient/family/caregiver  Janann Colonel, MD [facility] 04/25/2023 8:05 AM

## 2023-04-25 NOTE — Progress Notes (Signed)
Initial Nutrition Assessment  DOCUMENTATION CODES:   Obesity unspecified  INTERVENTION:   -TF via OGT:   Initiate trickle feeds per MD: Vital 1.5 @ 76ml/hr  Once able, increase by 10 ml every 8 hours to goal rate of 30 ml/hr.   60 ml Prosource TF 4 times daily   30 ml free water flush every 4 hours  Tube feeding regimen provides 1400 kcal (100% of needs), 129 grams of protein, and 583 ml of H2O. Total free water: 764 ml daily  -MVI with minerals daily via tube x 10 days -100 mg thiamine daily x 7 days via tube -Monitor Mg, K, and Phos and replete as needed secondary to high refeeding risk -Daily weights   NUTRITION DIAGNOSIS:   Inadequate oral intake related to inability to eat as evidenced by NPO status.  GOAL:   Provide needs based on ASPEN/SCCM guidelines  MONITOR:   TF tolerance  REASON FOR ASSESSMENT:   Consult, Ventilator Enteral/tube feeding initiation and management  ASSESSMENT:   Pt with a past medical history significant for COPD on home supplemental oxygen and diabetes mellitus who presented due to unresponsiveness.  2/13- intubated secondary to worsening ABG and mental status  Patient is currently intubated on ventilator support. OGT currently connected to low, intermittent suction. Per KUB on 04/24/23, tip of OG tube confirmed in stomach.  MV: 9.9 L/min Temp (24hrs), Avg:100 F (37.8 C), Min:94.5 F (34.7 C), Max:100.9 F (38.3 C)  Propofol: 33.8 ml/hr (provides 892 kcals daily)  Case discussed with RN and MD. Per RN, plan for trickle feeds today, Clarified order with MD; plan for trickle feeds today, with consideration of advancement once more hemodynamically stable.   Noted distant history of weight loss. No recent wt history available to better assess weight changes.   Medications reviewed and include colace, miralax, levophed, pitressin, propofol, and protonix.   Lab Results  Component Value Date   HGBA1C 6.5 (H) 04/25/2023   PTA DM  medications are none.   Labs reviewed: Na: 133, K, Mg, and Phos WDL. CBGS: 227-366 (inpatient orders for glycemic control are 0-9 units insulin aspart every 4 hours and IV insulin drip).    NUTRITION - FOCUSED PHYSICAL EXAM:  Flowsheet Row Most Recent Value  Orbital Region No depletion  Upper Arm Region No depletion  Thoracic and Lumbar Region No depletion  Buccal Region No depletion  Temple Region No depletion  Clavicle Bone Region No depletion  Clavicle and Acromion Bone Region No depletion  Scapular Bone Region No depletion  Dorsal Hand No depletion  Patellar Region No depletion  Anterior Thigh Region No depletion  Posterior Calf Region No depletion  Edema (RD Assessment) Mild  Hair Reviewed  Eyes Reviewed  Mouth Reviewed  Skin Reviewed  Nails Reviewed       Diet Order:   Diet Order             Diet NPO time specified  Diet effective now                   EDUCATION NEEDS:   Not appropriate for education at this time  Skin:  Skin Assessment: Skin Integrity Issues: Skin Integrity Issues:: Stage I Stage I: anus  Last BM:  Unknown  Height:   Ht Readings from Last 1 Encounters:  04/24/23 5\' 8"  (1.727 m)    Weight:   Wt Readings from Last 1 Encounters:  04/25/23 109.6 kg    Ideal Body Weight:  63.6 kg  BMI:  Body mass index is 36.74 kg/m.  Estimated Nutritional Needs:   Kcal:  4098-1191  Protein:  > 127 grams  Fluid:  1.2-1.5 L    Levada Schilling, RD, LDN, CDCES Registered Dietitian III Certified Diabetes Care and Education Specialist If unable to reach this RD, please use "RD Inpatient" group chat on secure chat between hours of 8am-4 pm daily

## 2023-04-25 NOTE — Consult Note (Signed)
Pharmacy Antibiotic Note  Sarah Norton is a 68 y.o. female admitted on 04/24/2023 with pneumonia. They have a past medical history significant for COPD on home oxygen. They presented due to unresponsiveness and was intubated during this admission. Patient with air trapping. Pharmacy has been consulted for Vancomycin and Cefepime dosing.  Today, 04/25/2023 Scr 2.38 (baseline ~ 0.72)  WBC 43.9 Tmax 100.9 F last 24 hours 2/14: Persistent bibasilar airspace disease; more prominent on left compared to right   Plan: Day 2 of antibiotics Due to worsening renal function, will move to dosing  Random level scheduled for tomorrow morning  Goal of 10-15 to redose next  Started on Cefepime 2 g IV Q24H, stopped ceftriaxone  Patient also on Zithromax 500 mg IV Q24H  Continue to monitor renal function and follow culture results  Height: 5\' 8"  (172.7 cm) Weight: 109.6 kg (241 lb 10 oz) IBW/kg (Calculated) : 63.9  Temp (24hrs), Avg:99.8 F (37.7 C), Min:94.5 F (34.7 C), Max:100.9 F (38.3 C)  Recent Labs  Lab 04/24/23 1240 04/24/23 1738 04/25/23 0411  WBC 33.4*  --  43.9*  CREATININE 1.27*  --  2.38*  LATICACIDVEN 1.7 1.2  --     Estimated Creatinine Clearance: 28.9 mL/min (A) (by C-G formula based on SCr of 2.38 mg/dL (H)).    No Known Allergies  Antimicrobials this admission: 2/13 ceftriaxone x 1 azithromycin 2/13 >>  vancomycin 2/13 >>  cefepime 2/14 >>   Dose adjustments this admission:  Ceftriaxone x 1  Microbiology results: 2/13 MRSA PCR: negative 2/13 BCx: NGTD   Thank you for allowing pharmacy to be a part of this patient's care.  Effie Shy, PharmD Pharmacy Resident  04/25/2023 11:53 AM

## 2023-04-25 NOTE — Consult Note (Signed)
PHARMACY CONSULT NOTE - ELECTROLYTES  Pharmacy Consult for Electrolyte Monitoring and Replacement   Recent Labs:  Height: 5\' 8"  (172.7 cm) Weight: 109.6 kg (241 lb 10 oz) IBW/kg (Calculated) : 63.9 Estimated Creatinine Clearance: 22.6 mL/min (A) (by C-G formula based on SCr of 3.05 mg/dL (H)). Potassium (mmol/L)  Date Value  04/25/2023 3.7  11/16/2013 3.5   Magnesium (mg/dL)  Date Value  16/12/9602 1.9  11/16/2013 1.0 (L)   Calcium (mg/dL)  Date Value  54/11/8117 8.1 (L)   Calcium, Total (mg/dL)  Date Value  14/78/2956 9.1   Albumin (g/dL)  Date Value  21/30/8657 2.6 (L)  11/16/2013 3.8   Phosphorus (mg/dL)  Date Value  84/69/6295 2.6   Sodium (mmol/L)  Date Value  04/25/2023 135  11/16/2013 133 (L)    Assessment  Sarah Norton is a 68 y.o. female presenting with respiratory failure and unresponsive. PMH significant for DM and COPD. Due to high BG started an insulin gtt to target POC 140 - 180. Patient Pharmacy has been consulted to monitor and replace electrolytes.  Diet: npo Pertinent medications: propofol, norepi, vaso  Goal of Therapy: Electrolytes WNL Na seems to run low at baseline  K = 3.5 >> 4.2  Plan:  BMP every 4 hours due to insulin gtt to monitor K Check renal panel and Mg with AM labs  Thank you for allowing pharmacy to be a part of this patient's care.  Clovia Cuff, PharmD, BCPS 04/25/2023 7:50 PM

## 2023-04-25 NOTE — Progress Notes (Signed)
PHARMACY NOTE:  ANTIMICROBIAL RENAL DOSAGE ADJUSTMENT  Current antimicrobial regimen includes a mismatch between antimicrobial dosage and estimated renal function.  As per policy approved by the Pharmacy & Therapeutics and Medical Executive Committees, the antimicrobial dosage will be adjusted accordingly.  Current antimicrobial dosage: Tamiflu 30 mg BID  Indication: Influenza A Infection   Renal Function:  Estimated Creatinine Clearance: 28.9 mL/min (A) (by C-G formula based on SCr of 2.38 mg/dL (H)). []      On intermittent HD, scheduled: []      On CRRT    Antimicrobial dosage has been changed to:  Tamiflu 30 mg daily  Additional comments: Monitor for renal improvement and re-adjusted accordingly  Thank you for allowing pharmacy to be a part of this patient's care.  Effie Shy, PharmD Pharmacy Resident  04/25/2023 11:39 AM

## 2023-04-25 NOTE — Progress Notes (Signed)
Per Dr. Larinda Buttery okay for RN to order LR 500 ml bolus for this pt.

## 2023-04-25 NOTE — Consult Note (Addendum)
PHARMACY CONSULT NOTE - ELECTROLYTES  Pharmacy Consult for Electrolyte Monitoring and Replacement   Recent Labs:  Height: 5\' 8"  (172.7 cm) Weight: 109.6 kg (241 lb 10 oz) IBW/kg (Calculated) : 63.9 Estimated Creatinine Clearance: 28.9 mL/min (A) (by C-G formula based on SCr of 2.38 mg/dL (H)). Potassium (mmol/L)  Date Value  04/25/2023 4.2  11/16/2013 3.5   Magnesium (mg/dL)  Date Value  16/12/9602 2.0  11/16/2013 1.0 (L)   Calcium (mg/dL)  Date Value  54/11/8117 8.2 (L)   Calcium, Total (mg/dL)  Date Value  14/78/2956 9.1   Albumin (g/dL)  Date Value  21/30/8657 2.6 (L)  11/16/2013 3.8   Phosphorus (mg/dL)  Date Value  84/69/6295 4.2   Sodium (mmol/L)  Date Value  04/25/2023 133 (L)  11/16/2013 133 (L)    Assessment  Sarah Norton is a 68 y.o. female presenting with respiratory failure and unresponsive. PMH significant for DM and COPD. Patient Pharmacy has been consulted to monitor and replace electrolytes.  Diet: npo Pertinent medications: propofol, norepi, vaso  Goal of Therapy: Electrolytes WNL Na seems to run low at baseline   Plan:  No replacement indicated at this time Check renal panel and Mg with AM labs Also checking BMP every 4 hours due to insulin gtt to monitor K  Thank you for allowing pharmacy to be a part of this patient's care.  Effie Shy, PharmD Pharmacy Resident  04/25/2023 7:38 AM

## 2023-04-26 ENCOUNTER — Inpatient Hospital Stay: Payer: 59

## 2023-04-26 DIAGNOSIS — J9601 Acute respiratory failure with hypoxia: Secondary | ICD-10-CM | POA: Diagnosis not present

## 2023-04-26 DIAGNOSIS — J09X2 Influenza due to identified novel influenza A virus with other respiratory manifestations: Secondary | ICD-10-CM | POA: Diagnosis not present

## 2023-04-26 DIAGNOSIS — A419 Sepsis, unspecified organism: Secondary | ICD-10-CM | POA: Diagnosis not present

## 2023-04-26 DIAGNOSIS — J9602 Acute respiratory failure with hypercapnia: Secondary | ICD-10-CM | POA: Diagnosis not present

## 2023-04-26 DIAGNOSIS — J80 Acute respiratory distress syndrome: Secondary | ICD-10-CM | POA: Diagnosis not present

## 2023-04-26 DIAGNOSIS — J441 Chronic obstructive pulmonary disease with (acute) exacerbation: Secondary | ICD-10-CM | POA: Diagnosis not present

## 2023-04-26 LAB — COOXEMETRY PANEL
Carboxyhemoglobin: 1 % (ref 0.5–1.5)
Methemoglobin: 0.9 % (ref 0.0–1.5)
O2 Saturation: 99.6 %
Total hemoglobin: 10.7 g/dL — ABNORMAL LOW (ref 12.0–16.0)
Total oxygen content: 97.6 %

## 2023-04-26 LAB — TSH: TSH: 0.039 u[IU]/mL — ABNORMAL LOW (ref 0.350–4.500)

## 2023-04-26 LAB — BLOOD GAS, ARTERIAL
Acid-base deficit: 3.6 mmol/L — ABNORMAL HIGH (ref 0.0–2.0)
Acid-base deficit: 10.6 mmol/L — ABNORMAL HIGH (ref 0.0–2.0)
Acid-base deficit: 8.9 mmol/L — ABNORMAL HIGH (ref 0.0–2.0)
Acid-base deficit: 6.6 mmol/L — ABNORMAL HIGH (ref 0.0–2.0)
Bicarbonate: 24.2 mmol/L (ref 20.0–28.0)
Bicarbonate: 19 mmol/L — ABNORMAL LOW (ref 20.0–28.0)
Bicarbonate: 20.1 mmol/L (ref 20.0–28.0)
Bicarbonate: 20.6 mmol/L (ref 20.0–28.0)
FIO2: 60 %
FIO2: 50 %
FIO2: 50 %
FIO2: 50 %
MECHVT: 500 mL
MECHVT: 540 mL
O2 Saturation: 99.7 %
O2 Saturation: 98.6 %
O2 Saturation: 99 %
O2 Saturation: 100 %
PEEP: 12 cmH2O
PEEP: 12 cmH2O
PEEP: 12 cmH2O
PEEP: 12 cmH2O
Patient temperature: 37.6
Patient temperature: 37
Patient temperature: 37
Patient temperature: 36.5
RATE: 24 {breaths}/min
RATE: 24 {breaths}/min
RATE: 24 {breaths}/min
RATE: 24 {breaths}/min
Spontaneous VT: 500 mL
Spontaneous VT: 540 mL
pCO2 arterial: 55 mm[Hg] — ABNORMAL HIGH (ref 32–48)
pCO2 arterial: 57 mm[Hg] — ABNORMAL HIGH (ref 32–48)
pCO2 arterial: 55 mm[Hg] — ABNORMAL HIGH (ref 32–48)
pCO2 arterial: 47 mm[Hg] (ref 32–48)
pH, Arterial: 7.25 — ABNORMAL LOW (ref 7.35–7.45)
pH, Arterial: 7.13 — CL (ref 7.35–7.45)
pH, Arterial: 7.17 — CL (ref 7.35–7.45)
pH, Arterial: 7.25 — ABNORMAL LOW (ref 7.35–7.45)
pO2, Arterial: 123 mm[Hg] — ABNORMAL HIGH (ref 83–108)
pO2, Arterial: 109 mm[Hg] — ABNORMAL HIGH (ref 83–108)
pO2, Arterial: 110 mm[Hg] — ABNORMAL HIGH (ref 83–108)
pO2, Arterial: 115 mm[Hg] — ABNORMAL HIGH (ref 83–108)

## 2023-04-26 LAB — GLUCOSE, CAPILLARY
Glucose-Capillary: 197 mg/dL — ABNORMAL HIGH (ref 70–99)
Glucose-Capillary: 211 mg/dL — ABNORMAL HIGH (ref 70–99)
Glucose-Capillary: 218 mg/dL — ABNORMAL HIGH (ref 70–99)
Glucose-Capillary: 319 mg/dL — ABNORMAL HIGH (ref 70–99)
Glucose-Capillary: 239 mg/dL — ABNORMAL HIGH (ref 70–99)
Glucose-Capillary: 172 mg/dL — ABNORMAL HIGH (ref 70–99)

## 2023-04-26 LAB — HEPATIC FUNCTION PANEL
ALT: 20 U/L (ref 0–44)
AST: 26 U/L (ref 15–41)
Albumin: 2.2 g/dL — ABNORMAL LOW (ref 3.5–5.0)
Alkaline Phosphatase: 75 U/L (ref 38–126)
Bilirubin, Direct: 0.4 mg/dL — ABNORMAL HIGH (ref 0.0–0.2)
Indirect Bilirubin: 0.8 mg/dL (ref 0.3–0.9)
Total Bilirubin: 1.2 mg/dL (ref 0.0–1.2)
Total Protein: 6.2 g/dL — ABNORMAL LOW (ref 6.5–8.1)

## 2023-04-26 LAB — RENAL FUNCTION PANEL
Albumin: 2.3 g/dL — ABNORMAL LOW (ref 3.5–5.0)
Anion gap: 17 — ABNORMAL HIGH (ref 5–15)
BUN: 79 mg/dL — ABNORMAL HIGH (ref 8–23)
CO2: 22 mmol/L (ref 22–32)
Calcium: 8.3 mg/dL — ABNORMAL LOW (ref 8.9–10.3)
Chloride: 94 mmol/L — ABNORMAL LOW (ref 98–111)
Creatinine, Ser: 4.02 mg/dL — ABNORMAL HIGH (ref 0.44–1.00)
GFR, Estimated: 11 mL/min — ABNORMAL LOW (ref >60)
Glucose, Bld: 226 mg/dL — ABNORMAL HIGH (ref 70–99)
Phosphorus: 2.9 mg/dL (ref 2.5–4.6)
Potassium: 4.1 mmol/L (ref 3.5–5.1)
Sodium: 133 mmol/L — ABNORMAL LOW (ref 135–145)

## 2023-04-26 LAB — CBC
HCT: 29.1 % — ABNORMAL LOW (ref 36.0–46.0)
Hemoglobin: 9.8 g/dL — ABNORMAL LOW (ref 12.0–15.0)
MCH: 31 pg (ref 26.0–34.0)
MCHC: 33.7 g/dL (ref 30.0–36.0)
MCV: 92.1 fL (ref 80.0–100.0)
Platelets: 332 10*3/uL (ref 150–400)
RBC: 3.16 MIL/uL — ABNORMAL LOW (ref 3.87–5.11)
RDW: 16 % — ABNORMAL HIGH (ref 11.5–15.5)
WBC: 46.7 10*3/uL — ABNORMAL HIGH (ref 4.0–10.5)
nRBC: 0.3 % — ABNORMAL HIGH (ref 0.0–0.2)

## 2023-04-26 LAB — BASIC METABOLIC PANEL
Anion gap: 14 (ref 5–15)
BUN: 86 mg/dL — ABNORMAL HIGH (ref 8–23)
CO2: 19 mmol/L — ABNORMAL LOW (ref 22–32)
Calcium: 7.7 mg/dL — ABNORMAL LOW (ref 8.9–10.3)
Chloride: 99 mmol/L (ref 98–111)
Creatinine, Ser: 4.73 mg/dL — ABNORMAL HIGH (ref 0.44–1.00)
GFR, Estimated: 9 mL/min — ABNORMAL LOW (ref >60)
Glucose, Bld: 280 mg/dL — ABNORMAL HIGH (ref 70–99)
Potassium: 4 mmol/L (ref 3.5–5.1)
Sodium: 132 mmol/L — ABNORMAL LOW (ref 135–145)

## 2023-04-26 LAB — MAGNESIUM: Magnesium: 2 mg/dL (ref 1.7–2.4)

## 2023-04-26 LAB — HEPARIN LEVEL (UNFRACTIONATED): Heparin Unfractionated: 0.67 [IU]/mL (ref 0.30–0.70)

## 2023-04-26 LAB — T4, FREE: Free T4: 0.71 ng/dL (ref 0.61–1.12)

## 2023-04-26 LAB — VANCOMYCIN, RANDOM
Vancomycin Rm: 18 ug/mL
Vancomycin Rm: 16 ug/mL

## 2023-04-26 LAB — PROCALCITONIN: Procalcitonin: 46.05 ng/mL

## 2023-04-26 LAB — APTT: aPTT: 45 s — ABNORMAL HIGH (ref 24–36)

## 2023-04-26 MED ORDER — HEPARIN SODIUM (PORCINE) 1000 UNIT/ML DIALYSIS
1000.0000 [IU] | INTRAMUSCULAR | Status: DC | PRN
  Filled 2023-04-26: qty 3

## 2023-04-26 MED ORDER — HEPARIN (PORCINE) 25000 UT/250ML-% IV SOLN
1650.0000 [IU]/h | INTRAVENOUS | Status: DC
  Administered 2023-04-26: 1200 [IU]/h via INTRAVENOUS
  Administered 2023-04-27: 1500 [IU]/h via INTRAVENOUS
  Administered 2023-04-28 – 2023-05-08 (×17): 1650 [IU]/h via INTRAVENOUS
  Filled 2023-04-26 (×22): qty 250

## 2023-04-26 MED ORDER — PRISMASOL BGK 4/2.5 32-4-2.5 MEQ/L EC SOLN: Status: DC

## 2023-04-26 MED ORDER — SODIUM CHLORIDE 0.9 % IV SOLN
2.0000 g | Freq: Two times a day (BID) | INTRAVENOUS | Status: DC
  Administered 2023-04-26 – 2023-04-29 (×6): 2 g via INTRAVENOUS
  Filled 2023-04-26 (×7): qty 12.5

## 2023-04-26 MED ORDER — INSULIN GLARGINE-YFGN 100 UNIT/ML ~~LOC~~ SOLN
10.0000 [IU] | Freq: Every day | SUBCUTANEOUS | Status: DC
  Administered 2023-04-26: 10 [IU] via SUBCUTANEOUS
  Filled 2023-04-26: qty 0.1

## 2023-04-26 MED ORDER — OSELTAMIVIR PHOSPHATE 6 MG/ML PO SUSR
75.0000 mg | Freq: Every day | ORAL | Status: DC
  Administered 2023-04-27: 75 mg
  Filled 2023-04-26 (×2): qty 12.5

## 2023-04-26 MED ORDER — INSULIN GLARGINE-YFGN 100 UNIT/ML ~~LOC~~ SOLN
10.0000 [IU] | Freq: Every day | SUBCUTANEOUS | Status: DC
  Administered 2023-04-26 – 2023-04-27 (×2): 10 [IU] via SUBCUTANEOUS
  Filled 2023-04-26 (×2): qty 0.1

## 2023-04-26 MED ORDER — LACTATED RINGERS IV BOLUS
500.0000 mL | Freq: Once | INTRAVENOUS | Status: AC
  Administered 2023-04-26: 500 mL via INTRAVENOUS

## 2023-04-26 MED ORDER — STERILE WATER FOR INJECTION IV SOLN: INTRAVENOUS | Status: DC

## 2023-04-26 MED ORDER — HEPARIN BOLUS VIA INFUSION
1350.0000 [IU] | Freq: Once | INTRAVENOUS | Status: AC
  Administered 2023-04-26: 1350 [IU] via INTRAVENOUS
  Filled 2023-04-26: qty 1350

## 2023-04-26 NOTE — Progress Notes (Addendum)
 Pt now in afib, controlled rate, BP stable rate in 90s. 12 lead done. Provider notified

## 2023-04-26 NOTE — Consult Note (Signed)
 [facility] Consult Note: 04/26/23     Date of Admission:  04/24/2023           Reason for Consult:  AKI   Referring Provider: Janann Colonel, MD Primary Care Provider: Emogene Morgan, MD   History of Presenting Illness:  TEAGHAN MELROSE is a 68 y.o. female who is a former Engineer, civil (consulting) from [employer]  She has medical problems of COPD, on home supplemental oxygen's, diabetes, coronary artery disease, hypertension.  She presented to the emergency room on 02/21/2023 via EMS from home for decreased responsiveness.  She was found to be hypoxic, placed on oxygen supplementation and CPAP.  Initially she was found to be covered in urine on night with foul odor. She tested positive for influenza A.  A CT angiogram of the chest was negative for pulmonary embolism but showed bibasilar consolidation more prominent on the right side.  Patient is currently intubated and sedated and or information is obtained from the chart.  Review of labs: Upon initial presentation she was noted to be severely hypercarbic and hypoxic.   Her baseline creatinine appears to be 1.27/GFR 46 from April 24, 2023.  Over the course of hospitalization creatinine has further increased to 4.02, GFR 11.  Urine output is very poor.  Albumin level is low at 2.2.  She is currently sedated with fentanyl and propofol.  She is requiring vasopressin and Levophed for hemodynamic support.  Tube feeds at 30 cc/h.  Ventilator FiO2 60%, PEEP 12.  Review of Systems: ROS unavailable as patient is intubated and sedated.  Past Medical History:  Diagnosis Date   Arthritis    Asthma    Cataract of both eyes    Complication of anesthesia    woke up during surgery x1   COPD (chronic obstructive pulmonary disease) (HCC)    Coronary artery disease    DDD (degenerative disc disease), lumbar    Diabetes mellitus without complication (HCC)    Dyspnea    DUE TO COPD   Hernia of abdominal wall    History  of hiatal hernia    small   Hyperlipidemia    Hypertension    Hypothyroidism    hashimotos throiditis  -thyroidectomy  2000   Occasional tremors    Oxygen desaturation    NOW PT ON 3-4 LITERS [place] DAILY    Social History   Tobacco Use   Smoking status: Former    Current packs/day: 0.00    Average packs/day: 1 pack/day for 48.0 years (48.0 ttl pk-yrs)    Types: Cigarettes    Start date: 73    Quit date: 2018    Years since quitting: 7.1   Smokeless tobacco: Never  Vaping Use   Vaping status: Some Days   Substances: Nicotine, Flavoring  Substance Use Topics   Alcohol use: No   Drug use: No    Family History  Problem Relation Age of Onset   Breast cancer Sister    Kidney disease Mother      OBJECTIVE: Blood pressure (!) 109/53, pulse 100, temperature 98.8 F (37.1 C), resp. rate (!) 24, height 5\' 8"  (1.727 m), weight 115.5 kg, SpO2 93%.  Physical Exam General Appearance-critically ill-appearing female, laying in the bed HEENT-ET tube in place Pulmonary-ventilator assisted.  FiO2 60%, PEEP 12 Cardiac-regular rhythm noted on telemetry Abdomen-nondistended Extremities-SCDs in place, trace edema Skin-no acute rashes noted Neuro-sedated.  Lab Results Lab Results  Component Value Date   WBC 46.7 (H) 04/26/2023  HGB 9.8 (L) 04/26/2023   HCT 29.1 (L) 04/26/2023   MCV 92.1 04/26/2023   PLT 332 04/26/2023    Lab Results  Component Value Date   CREATININE 4.02 (H) 04/26/2023   BUN 79 (H) 04/26/2023   NA 133 (L) 04/26/2023   K 4.1 04/26/2023   CL 94 (L) 04/26/2023   CO2 22 04/26/2023    Lab Results  Component Value Date   ALT 20 04/26/2023   AST 26 04/26/2023   ALKPHOS 75 04/26/2023   BILITOT 1.2 04/26/2023     Microbiology: Recent Results (from the past 240 hours)  Culture, blood (Routine x 2)     Status: None (Preliminary result)   Collection Time: 04/24/23 12:40 PM   Specimen: BLOOD  Result Value Ref Range Status   Specimen Description BLOOD  RIGHT ARM  Final   Special Requests   Final    BOTTLES DRAWN AEROBIC AND ANAEROBIC Blood Culture adequate volume   Culture  Setup Time PENDING  Incomplete   Culture   Final    NO GROWTH 2 DAYS Performed at [facility], [address] [place], [place] [address]    Report Status PENDING  Incomplete  Culture, blood (Routine x 2)     Status: None (Preliminary result)   Collection Time: 04/24/23 12:42 PM   Specimen: BLOOD  Result Value Ref Range Status   Specimen Description BLOOD LEFT ANTECUBITAL  Final   Special Requests   Final    BOTTLES DRAWN AEROBIC AND ANAEROBIC Blood Culture adequate volume   Culture  Setup Time NO ORGANISMS SEEN  Final   Culture   Final    NO GROWTH 2 DAYS Performed at [facility], [address] [place], [place] [address]    Report Status PENDING  Incomplete  Resp panel by RT-PCR (RSV, Flu A&B, Covid) Anterior Nasal Swab     Status: Abnormal   Collection Time: 04/24/23 12:42 PM   Specimen: Anterior Nasal Swab  Result Value Ref Range Status   SARS Coronavirus 2 by RT PCR NEGATIVE NEGATIVE Final    Comment: (NOTE) SARS-CoV-2 target nucleic acids are NOT DETECTED.  The SARS-CoV-2 RNA is generally detectable in upper respiratory specimens during the acute phase of infection. The lowest concentration of SARS-CoV-2 viral copies this assay can detect is 138 copies/mL. A negative result does not preclude SARS-Cov-2 infection and should not be used as the sole basis for treatment or other patient management decisions. A negative result may occur with  improper specimen collection/handling, submission of specimen other than nasopharyngeal swab, presence of viral mutation(s) within the areas targeted by this assay, and inadequate number of viral copies(<138 copies/mL). A negative result must be combined with clinical observations, patient history, and epidemiological information. The expected result is Negative.  Fact Sheet for Patients:   BloggerCourse.com  Fact Sheet for Healthcare Providers:  SeriousBroker.it  This test is no t yet approved or cleared by the [place] FDA and  has been authorized for detection and/or diagnosis of SARS-CoV-2 by FDA under an Emergency Use Authorization (EUA). This EUA will remain  in effect (meaning this test can be used) for the duration of the COVID-19 declaration under Section 564(b)(1) of the Act, 21 U.S.C.section 360bbb-3(b)(1), unless the authorization is terminated  or revoked sooner.       Influenza A by PCR POSITIVE (A) NEGATIVE Final   Influenza B by PCR NEGATIVE NEGATIVE Final    Comment: (NOTE) The Xpert Xpress SARS-CoV-2/FLU/RSV plus assay  is intended as an aid in the diagnosis of influenza from Nasopharyngeal swab specimens and should not be used as a sole basis for treatment. Nasal washings and aspirates are unacceptable for Xpert Xpress SARS-CoV-2/FLU/RSV testing.  Fact Sheet for Patients: BloggerCourse.com  Fact Sheet for Healthcare Providers: SeriousBroker.it  This test is not yet approved or cleared by the [place] FDA and has been authorized for detection and/or diagnosis of SARS-CoV-2 by FDA under an Emergency Use Authorization (EUA). This EUA will remain in effect (meaning this test can be used) for the duration of the COVID-19 declaration under Section 564(b)(1) of the Act, 21 U.S.C. section 360bbb-3(b)(1), unless the authorization is terminated or revoked.     Resp Syncytial Virus by PCR NEGATIVE NEGATIVE Final    Comment: (NOTE) Fact Sheet for Patients: BloggerCourse.com  Fact Sheet for Healthcare Providers: SeriousBroker.it  This test is not yet approved or cleared by the [place] FDA and has been authorized for detection and/or diagnosis of SARS-CoV-2 by FDA under an Emergency Use  Authorization (EUA). This EUA will remain in effect (meaning this test can be used) for the duration of the COVID-19 declaration under Section 564(b)(1) of the Act, 21 U.S.C. section 360bbb-3(b)(1), unless the authorization is terminated or revoked.  Performed at [facility], [address] [place], [place] [address]   MRSA Next Gen by PCR, Nasal     Status: None   Collection Time: 04/24/23  9:52 PM   Specimen: Nasal Mucosa; Nasal Swab  Result Value Ref Range Status   MRSA by PCR Next Gen NOT DETECTED NOT DETECTED Final    Comment: (NOTE) The GeneXpert MRSA Assay (FDA approved for NASAL specimens only), is one component of a comprehensive MRSA colonization surveillance program. It is not intended to diagnose MRSA infection nor to guide or monitor treatment for MRSA infections. Test performance is not FDA approved in patients less than 18 years old. Performed at [facility], [address] [place], [place] [address]   Respiratory (~20 pathogens) panel by PCR     Status: Abnormal   Collection Time: 04/25/23  1:27 PM   Specimen: Nasopharyngeal Swab; Respiratory  Result Value Ref Range Status   Adenovirus NOT DETECTED NOT DETECTED Final   Coronavirus 229E NOT DETECTED NOT DETECTED Final    Comment: (NOTE) The Coronavirus on the Respiratory Panel, DOES NOT test for the novel  Coronavirus (2019 nCoV)    Coronavirus HKU1 NOT DETECTED NOT DETECTED Final   Coronavirus NL63 NOT DETECTED NOT DETECTED Final   Coronavirus OC43 NOT DETECTED NOT DETECTED Final   Metapneumovirus NOT DETECTED NOT DETECTED Final   Rhinovirus / Enterovirus NOT DETECTED NOT DETECTED Final   Influenza A H3 DETECTED (A) NOT DETECTED Final   Influenza B NOT DETECTED NOT DETECTED Final   Parainfluenza Virus 1 NOT DETECTED NOT DETECTED Final   Parainfluenza Virus 2 NOT DETECTED NOT DETECTED Final   Parainfluenza Virus 3 NOT DETECTED NOT DETECTED Final   Parainfluenza Virus 4 NOT DETECTED NOT  DETECTED Final   Respiratory Syncytial Virus NOT DETECTED NOT DETECTED Final   Bordetella pertussis NOT DETECTED NOT DETECTED Final   Bordetella Parapertussis NOT DETECTED NOT DETECTED Final   Chlamydophila pneumoniae NOT DETECTED NOT DETECTED Final   Mycoplasma pneumoniae NOT DETECTED NOT DETECTED Final    Comment: Performed at [facility], [address] [place], [place] [address]    Medications: Scheduled Meds:  Chlorhexidine Gluconate Cloth  6 each Topical Daily  docusate  100 mg Per Tube BID   free water  30 mL Per Tube Q4H   hydrocortisone sod succinate (SOLU-CORTEF) inj  50 mg Intravenous Q6H   insulin aspart  0-20 Units Subcutaneous Q4H   insulin glargine-yfgn  10 Units Subcutaneous Daily   ipratropium-albuterol  3 mL Nebulization Q4H   levothyroxine  175 mcg Per Tube Q0600   multivitamin with minerals  1 tablet Per Tube Daily   mouth rinse  15 mL Mouth Rinse Q2H   oseltamivir  30 mg Per Tube Daily   pantoprazole (PROTONIX) IV  40 mg Intravenous QHS   polyethylene glycol  17 g Per Tube Daily   thiamine  100 mg Per Tube Daily   vancomycin variable dose per unstable renal function (pharmacist dosing)   Does not apply See admin instructions   Continuous Infusions:  azithromycin Stopped (04/26/23 1219)   ceFEPime (MAXIPIME) IV Stopped (04/26/23 1259)   feeding supplement (VITAL 1.5 CAL) 30 mL/hr at 04/26/23 1311   fentaNYL infusion INTRAVENOUS 175 mcg/hr (04/26/23 1311)   heparin 1,200 Units/hr (04/26/23 1311)   norepinephrine (LEVOPHED) Adult infusion 16 mcg/min (04/26/23 1311)   propofol (DIPRIVAN) infusion 40 mcg/kg/min (04/26/23 1320)   vasopressin 0.03 Units/min (04/26/23 1311)   PRN Meds:.acetaminophen, docusate sodium, fentaNYL, fentaNYL (SUBLIMAZE) injection, fentaNYL (SUBLIMAZE) injection, mouth rinse, polyethylene glycol  No Known Allergies  Urinalysis: Recent Labs    04/24/23 1240  COLORURINE YELLOW*  LABSPEC 1.016  PHURINE 5.0  GLUCOSEU 50*   HGBUR NEGATIVE  BILIRUBINUR NEGATIVE  KETONESUR NEGATIVE  PROTEINUR 100*  NITRITE NEGATIVE  LEUKOCYTESUR NEGATIVE      Imaging: US RENAL Result Date: 04/26/2023 CLINICAL DATA:  [id number] Acute kidney failure (HCC) [id number]. EXAM: RENAL / URINARY TRACT ULTRASOUND COMPLETE COMPARISON:  None Available. FINDINGS: Right Kidney: Renal measurements: 5.2 x 6.1 x 12.0 cm = volume: 199 mL. There is diffuse increased cortical echogenicity, nonspecific but commonly seen with medical renal disease. There are several simple cysts with largest measuring up to 1.7 x 1.7 x 1.8 cm. No contour deforming mass, hydronephrosis or stone large enough to cause acoustic shadowing. Left Kidney: Renal measurements: 4.9 x 6.1 x 12.2 cm = volume: 189 mL. There is diffuse increased cortical echogenicity, nonspecific but most commonly seen with medical renal disease. There is a single partially exophytic cyst in the lower pole measuring up to 1.1 x 1.3 x 1.4 cm. No contour deforming mass, hydronephrosis or stone large enough to cause acoustic shadowing. Bladder: Decompressed around a Foley catheter. Other: Incidental note is made of trace amount of ascites in the perihepatic and left perirenal space. IMPRESSION: 1. Bilateral kidneys exhibit diffuse increased cortical echogenicity, nonspecific but most commonly seen with medical renal disease. 2. There are bilateral simple renal cysts, as described above. 3. There is trace amount of ascites in the perihepatic and left perirenal space. Electronically Signed   By: Jules Schick M.D.   On: 04/26/2023 13:19   DG Chest Port 1 View Result Date: 04/26/2023 CLINICAL DATA:  [id number] Acute hypoxic respiratory failure [facility]) [id number] EXAM: PORTABLE CHEST 1 VIEW COMPARISON:  April 24, 2023 FINDINGS: The cardiomediastinal silhouette is unchanged in contour.ETT tip terminates 2.7 cm above the carina. The enteric tube courses through the chest to the abdomen beyond the field-of-view. LEFT neck CVC tip  terminates over the superior cavoatrial junction. Favored trace RIGHT pleural effusion. No pneumothorax. Improved aeration of the RIGHT lung base with persistent bibasilar heterogeneous opacities. IMPRESSION: 1. Support apparatus as described above. 2.  Improved aeration of the RIGHT lung base with persistent bibasilar heterogeneous opacities. Electronically Signed   By: Meda Klinefelter M.D.   On: 04/26/2023 12:04   ECHOCARDIOGRAM COMPLETE Result Date: 04/25/2023    ECHOCARDIOGRAM REPORT   Patient Name:   PATIENCE NUZZO Date of Exam: 04/25/2023 Medical Rec #:  [id number]      Height:       68.0 in Accession #:    [id number]     Weight:       241.6 lb Date of Birth:  Oct 15, 1955      BSA:          2.215 m Patient Age:    67 years       BP:           141/51 mmHg Patient Gender: F              HR:           85 bpm. Exam Location:  [facility] Procedure: 2D Echo, Cardiac Doppler and Color Doppler (Both Spectral and Color            Flow Doppler were utilized during procedure). Indications:     Elevated Troponin  History:         Patient has no prior history of Echocardiogram examinations.                  COPD; Risk Factors:Hypertension and Diabetes. Influenza +.  Sonographer:     Mikki Harbor Referring Phys:  [id number] Judithe Modest Diagnosing Phys: Debbe Odea MD  Sonographer Comments: Echo performed with patient supine and on artificial respirator. Image acquisition challenging due to respiratory motion. IMPRESSIONS  1. Left ventricular ejection fraction, by estimation, is 60 to 65%. The left ventricle has normal function. The left ventricle has no regional wall motion abnormalities. There is mild left ventricular hypertrophy. Left ventricular diastolic parameters were normal.  2. Right ventricular systolic function is normal. The right ventricular size is normal. There is moderately elevated pulmonary artery systolic pressure.  3. The mitral valve is normal in structure. No evidence of mitral valve  regurgitation.  4. The aortic valve is calcified. Aortic valve regurgitation is not visualized. Mild to moderate aortic valve stenosis. Aortic valve mean gradient measures 18.0 mmHg.  5. The inferior vena cava is dilated in size with <50% respiratory variability, suggesting right atrial pressure of 15 mmHg. FINDINGS  Left Ventricle: Left ventricular ejection fraction, by estimation, is 60 to 65%. The left ventricle has normal function. The left ventricle has no regional wall motion abnormalities. Strain imaging was not performed. The left ventricular internal cavity  size was normal in size. There is mild left ventricular hypertrophy. Left ventricular diastolic parameters were normal. Right Ventricle: The right ventricular size is normal. No increase in right ventricular wall thickness. Right ventricular systolic function is normal. There is moderately elevated pulmonary artery systolic pressure. The tricuspid regurgitant velocity is 3.00 m/s, and with an assumed right atrial pressure of 15 mmHg, the estimated right ventricular systolic pressure is 51.0 mmHg. Left Atrium: Left atrial size was normal in size. Right Atrium: Right atrial size was normal in size. Pericardium: There is no evidence of pericardial effusion. Mitral Valve: The mitral valve is normal in structure. Mild mitral annular calcification. No evidence of mitral valve regurgitation. MV peak gradient, 10.5 mmHg. The mean mitral valve gradient is 3.0 mmHg. Tricuspid Valve: The tricuspid valve is normal in structure. Tricuspid valve regurgitation is not demonstrated. Aortic Valve: The  aortic valve is calcified. Aortic valve regurgitation is not visualized. Mild to moderate aortic stenosis is present. Aortic valve mean gradient measures 18.0 mmHg. Aortic valve peak gradient measures 36.6 mmHg. Aortic valve area, by VTI measures 2.10 cm. Pulmonic Valve: The pulmonic valve was normal in structure. Pulmonic valve regurgitation is not visualized. Aorta: The  aortic root and ascending aorta are structurally normal, with no evidence of dilitation. Venous: The inferior vena cava is dilated in size with less than 50% respiratory variability, suggesting right atrial pressure of 15 mmHg. IAS/Shunts: No atrial level shunt detected by color flow Doppler. Additional Comments: 3D imaging was not performed.  LEFT VENTRICLE PLAX 2D LVIDd:         5.30 cm   Diastology LVIDs:         2.50 cm   LV e' medial:    8.49 cm/s LV PW:         1.20 cm   LV E/e' medial:  17.2 LV IVS:        1.10 cm   LV e' lateral:   16.40 cm/s LVOT diam:     2.00 cm   LV E/e' lateral: 8.9 LV SV:         111 LV SV Index:   50 LVOT Area:     3.14 cm  RIGHT VENTRICLE RV Basal diam:  3.75 cm RV Mid diam:    3.90 cm RV S prime:     17.30 cm/s TAPSE (M-mode): 2.5 cm LEFT ATRIUM             Index        RIGHT ATRIUM           Index LA diam:        4.00 cm 1.81 cm/m   RA Area:     15.90 cm LA Vol (A2C):   72.3 ml 32.64 ml/m  RA Volume:   41.80 ml  18.87 ml/m LA Vol (A4C):   57.9 ml 26.14 ml/m LA Biplane Vol: 66.2 ml 29.89 ml/m  AORTIC VALVE                     PULMONIC VALVE AV Area (Vmax):    1.92 cm      PV Vmax:       1.68 m/s AV Area (Vmean):   1.97 cm      PV Peak grad:  11.3 mmHg AV Area (VTI):     2.10 cm AV Vmax:           302.67 cm/s AV Vmean:          199.333 cm/s AV VTI:            0.527 m AV Peak Grad:      36.6 mmHg AV Mean Grad:      18.0 mmHg LVOT Vmax:         185.00 cm/s LVOT Vmean:        125.000 cm/s LVOT VTI:          0.352 m LVOT/AV VTI ratio: 0.67  AORTA Ao Root diam: 2.90 cm MITRAL VALVE                TRICUSPID VALVE MV Area (PHT): 2.71 cm     TR Peak grad:   36.0 mmHg MV Area VTI:   2.02 cm     TR Vmax:        300.00 cm/s MV Peak grad:  10.5 mmHg MV Mean grad:  3.0 mmHg     SHUNTS MV Vmax:       1.62 m/s     Systemic VTI:  0.35 m MV Vmean:      82.0 cm/s    Systemic Diam: 2.00 cm MV Decel Time: 280 msec MV E velocity: 146.00 cm/s MV A velocity: 102.00 cm/s MV E/A ratio:  1.43  Debbe Odea MD Electronically signed by Debbe Odea MD Signature Date/Time: 04/25/2023/1:16:24 PM    Final    DG Chest Port 1 View Result Date: 04/24/2023 CLINICAL DATA:  Central line placement EXAM: PORTABLE CHEST 1 VIEW COMPARISON:  04/24/2023 FINDINGS: Two frontal views of the chest demonstrate stable endotracheal tube and enteric catheter. Left internal jugular catheter tip overlies superior vena cava. Cardiac silhouette remains enlarged. Persistent bibasilar airspace disease and small left pleural effusion. No pneumothorax. IMPRESSION: 1. No complication after left internal jugular catheter placement. 2. Persistent bibasilar airspace disease and left effusion. Electronically Signed   By: Sharlet Salina M.D.   On: 04/24/2023 23:44   CT Angio Chest PE W and/or Wo Contrast Result Date: 04/24/2023 CLINICAL DATA:  Respiratory failure. EXAM: CT ANGIOGRAPHY CHEST WITH CONTRAST TECHNIQUE: Multidetector CT imaging of the chest was performed using the standard protocol during bolus administration of intravenous contrast. Multiplanar CT image reconstructions and MIPs were obtained to evaluate the vascular anatomy. RADIATION DOSE REDUCTION: This exam was performed according to the departmental dose-optimization program which includes automated exposure control, adjustment of the mA and/or kV according to patient size and/or use of iterative reconstruction technique. CONTRAST:  75mL OMNIPAQUE IOHEXOL 350 MG/ML SOLN COMPARISON:  10/16/2004. FINDINGS: Cardiovascular: Mild cardiomegaly. No pericardial effusion. No pulmonary artery filling defects to suggest PE. No evidence of aortic aneurysm or dissection. There are atheromatous calcifications of the aorta and coronary arteries. Mediastinum/Nodes: Paratracheal adenopathy identified with a node measuring up to 1.2 cm short axis. Postop changes in the thyroid bed. Unremarkable tracheobronchial tree. Endotracheal tube just above carina. NG tube in the esophagus  extending to the stomach. Lungs/Pleura: Atelectasis right lower lobe with volume loss and shift mediastinal structures towards the right. Interstitial prominence with interstitial edema and alveolar ground-glass opacities consistent with edema or pneumonitis. Bibasilar consolidation more prominent on the right than the left. No pneumothorax. Upper Abdomen: No acute abnormality. Musculoskeletal: No chest wall abnormality. No acute or significant osseous findings. Review of the MIP images confirms the above findings. IMPRESSION: 1. No evidence of PE, aortic aneurysm or dissection. 2. Cardiomegaly. 3. Interstitial and alveolar edema or pneumonitis. 4. Bibasilar consolidation more prominent on the right than the left. Atelectasis right base with volume loss. 5. Endotracheal tube just above carina. 6. Aortic atherosclerosis (ICD10-I70.0). Electronically Signed   By: Layla Maw M.D.   On: 04/24/2023 19:05   CT HEAD WO CONTRAST ( ) Result Date: 04/24/2023 CLINICAL DATA:  altered mental status EXAM: CT HEAD WITHOUT CONTRAST TECHNIQUE: Contiguous axial were obtained from the base of the skull through the vertex without intravenous contrast. RADIATION DOSE REDUCTION: This exam was performed according to the departmental dose-optimization program which includes automated exposure control, adjustment of the mA and/or kV according to patient size and/or use of iterative reconstruction technique. COMPARISON:  11/16/2013. FINDINGS: Brain: No evidence of acute infarction, hemorrhage, hydrocephalus, extra-axial collection or mass lesion/mass effect. Vascular: No hyperdense vessel or unexpected calcification. Skull: Normal. Negative for fracture or focal lesion. Sinuses/Orbits: Mucoperiosteal thickening consistent with chronic pansinusitis. Other: None. IMPRESSION: Chronic pansinusitis.  No acute intracranial process. Electronically Signed   By: Ivin Booty  Pleasure M.D.   On: 04/24/2023 18:57   DG Abdomen 1 View Result  Date: 04/24/2023 CLINICAL DATA:  OG tube placement EXAM: ABDOMEN - 1 VIEW COMPARISON:  None Available. FINDINGS: OG tube is in the stomach. Prior cholecystectomy. Bilateral lower lobe airspace opacities noted. IMPRESSION: OG tube in the stomach. Electronically Signed   By: Charlett Nose M.D.   On: 04/24/2023 17:13   DG Chest 1 View Result Date: 04/24/2023 CLINICAL DATA:  ET tube EXAM: CHEST  1 VIEW COMPARISON:  04/24/2023 FINDINGS: Endotracheal tube is 3 cm above the carina. NG tube enters the stomach. Cardiomegaly, vascular congestion. Bilateral lower lung airspace opacities are again noted, unchanged. No pneumothorax. No acute bony abnormality. IMPRESSION: Endotracheal tube 3 cm above the carina. Bilateral lower lung airspace opacities concerning for multifocal pneumonia. Findings unchanged. Electronically Signed   By: Charlett Nose M.D.   On: 04/24/2023 17:12      Assessment/Plan:  HEBA IGE is a 68 y.o. female with medical problems of  COPD, home oxygen, diabetes mellitus, coronary disease, hypertension  was admitted on 04/24/2023 for :  COPD exacerbation (HCC) [J44.1] Acute respiratory failure with hypoxia and hypercarbia (HCC) [J96.01, J96.02] Acute respiratory failure with hypoxia and hypercapnia (HCC) [J96.01, J96.02] Community acquired pneumonia of right lower lobe of lung [J18.9]  Acute kidney injury on chronic kidney disease stage IIIa.  Baseline creatinine 1 7/GFR 46 on 04/24/2023 (admission) IV contrast nephropathy Acute kidney injury likely secondary to ATN from concurrent illness/sepsis and IV contrast exposure on 04/24/2023. Urinalysis-shows proteinuria of 100 mg, 0-5 WBCs, 0-5 RBCs. Patient is currently oliguric.  Electrolytes and volume status is acceptable at present.  No acute indication for dialysis however we will monitor clinical status on a daily basis.  Pneumonia, sepsis, hypotension Influenza A positive Currently requiring ventilator support and  pressors. Antibiotic regimen includes azithromycin, 7 Tamiflu, vancomycin. Management as per ICU team.    Mosetta Pigeon 04/26/23

## 2023-04-26 NOTE — Progress Notes (Signed)
 Patient with worsening kidney function, worsening metabolic acidosis. ABG with mixed respiratory and metabolic acidosis. Unable to ventilate her on the ventilator. CVP is elevated and she is anuric.   Called her Son Sarah Norton and daughter Sarah Norton and advised to proceed with CRRT. The were agreeable and consented for both the HD catheter and CRRT.   Janann Colonel, MD [facility] 04/26/2023 7:06 PM

## 2023-04-26 NOTE — Plan of Care (Signed)
  Problem: Clinical Measurements: Goal: Ability to maintain clinical measurements within normal limits will improve Outcome: Not Progressing Goal: Will remain free from infection Outcome: Not Progressing Goal: Diagnostic test results will improve Outcome: Not Progressing Goal: Cardiovascular complication will be avoided Outcome: Not Progressing   Problem: Activity: Goal: Risk for activity intolerance will decrease Outcome: Not Progressing   Problem: Nutrition: Goal: Adequate nutrition will be maintained Outcome: Progressing   Problem: Safety: Goal: Ability to remain free from injury will improve Outcome: Progressing   Problem: Skin Integrity: Goal: Risk for impaired skin integrity will decrease Outcome: Progressing   Problem: Fluid Volume: Goal: Ability to maintain a balanced intake and output will improve Outcome: Not Progressing

## 2023-04-26 NOTE — Progress Notes (Signed)
 NAME:  Sarah Norton, MRN:  [id number], DOB:  06-May-1955, LOS: 2 ADMISSION DATE:  04/24/2023  History of Present Illness:  Sarah Norton is a 68 year old female with a past medical history significant for COPD on home supplemental oxygen and diabetes mellitus who presented to [facility] ED on 04/24/2023 due to unresponsiveness.  Upon EMS arrival she was noted to be hypoxic with O2 saturations in the 40s for which she was placed on CPAP with improvement in sats to the 80s.  Upon arrival to the ED she remained somnolent but slightly arousable, and given her hypoxia, she was transitioned to BiPAP.  Pt is currently intubated and sedated, and no other history is currently available.    ED Course: Initial Vital Signs: Temperature 96.5 F axillary, pulse 87, respiratory rate 33, blood pressure 136/97, SpO2 78% Significant Labs: Sodium 134, glucose 313, BUN 59, creatinine 1.27, BNP 436, high-sensitivity troponin 139, lactic acid 1.7, WBC 33.4, D-dimer 2.13 VBG: pH 7.19/pCO2 82/pO2 39/bicarb 31.3 Positive for influenza A Urinalysis negative for UTI Imaging Chest X-ray>>IMPRESSION: *Bibasilar heterogeneous opacities, favored to represent multilobar pneumonia. Follow-up to clearing is recommended. CTa Chest>> pending currently Medications Administered: 1 L normal saline bolus, 125 mg Solu-Medrol, IV azithromycin and ceftriaxone, Levophed drip initiated   Despite BiPAP patient's ABG worsened with worsening mental status.  Therefore ED provider elected to intubate.  PCCM is asked to admit for further workup and treatment.   Please see "significant hospital events" section below for full detailed hospital course.  Pertinent  Medical History  Arthritis, Asthma, COPD with CHRF on 2l [place], HTN, HLD, Hypothyroidism.   Significant Hospital Events: Including procedures, antibiotic start and stop dates in addition to other pertinent events   04/24/2023 - Admitted to the ICU intubated and MV.  04/25/2023 - Patient  with improving respiratory status however with worsening wbc and procalcitonin.   Interim History / Subjective:  ON: Respiratory rate increased to 24. PH this am 7.25 from 7.16.   Objective   Blood pressure (!) 129/48, pulse 80, temperature 99.5 F (37.5 C), resp. rate (!) 24, height 5\' 8"  (1.727 m), weight 115.5 kg, SpO2 98%. CVP:  [5 mmHg-22 mmHg] 10 mmHg  Vent Mode: PRVC FiO2 (%):  [60 %] 60 % Set Rate:  [20 bmp-24 bmp] 24 bmp Vt Set:  [500 mL] 500 mL PEEP:  [12 cmH20] 12 cmH20 Pressure Support:  [12 cmH20] 12 cmH20   Intake/Output Summary (Last 24 hours) at 04/26/2023 0833 Last data filed at 04/26/2023 0710 Gross per 24 hour  Intake 4156.38 ml  Output 355 ml  Net 3801.38 ml   Filed Weights   04/24/23 2129 04/25/23 0500 04/26/23 0457  Weight: 109.6 kg 109.6 kg 115.5 kg    Examination: General: Intubated, Sedated HENT: Supple neck, reactive pupils Lungs: Diffuse ronchi w/ Expiratory wheezing.  Cardiovascular: Normal S1, Normal S2, RRR  Abdomen: Soft, non tender, non distended, +BS  Extremities: Warm and well perfused   Labs and imaging were reviwed.   Assessment & Plan:  Case of 68 year old female patient with past medical history of COPD with chronic hypoxic respiratory failure on 2 L nasal cannula presenting to [facility] with acute on chronic hypoxic hypercapnic respiratory failure requiring intubation and mechanical ventilation on 02/13 secondary to ARDS, influenza type A with possible superimposed bacterial pneumonia.  # Acute on chronic hypoxic hypercapnic respiratory failure requiring intubation and mechanical ventilation on 04/24/2023 secondary to # ARDS in the setting of influenza type A and possible superimposed bacterial pneumonia. #  COPD exacerbation # Septic shock secondary to the above requiring pressor support # Paroxysmal A-fib on Eliquis # Demand ischemia troponin at 139 # AKI with creatinine 4.0 mg/dL from a baseline of 1.61 mg/dL likely secondary to septic  ATN  Neuro: Propofol and fentanyl for RASS -2 to -3 CPOTless than 2. Ok for intermittent paralytics.  CVS: Norepinephrine and vasopressin for MAP greater than 65.  Keep holding home Eliquis for now. Lungs/ID: DuoNebs every 4 hours scheduled.  Hydrocortisone 50mg  Q6H.  MRSA swab negative.  Sputum culture pending.  Continue cefepime azithromycin and vancomycin.  Lung protective ventilation currently at 8 cc/kg.  SpO2 goal greater than 90%.  Permissive hypercapnia with pH greater than 7.2. GI PPI for GI prophylaxis.  Start tube feeds can advance as tolerated.  Laxatives as needed. Endo start insulin drip for POC's 140-180 Renal Avoid nephrotoxic agents. Monitor UOP. Appreciate renal input. Heme Holding Eliquis start subcutaneous Heparin.   Best Practice (right click and "Reselect all SmartList Selections" daily)   Diet/type: tubefeeds DVT prophylaxis prophylactic heparin  Pressure ulcer(s): N/A GI prophylaxis: PPI Lines: Central line Foley:  Yes, and it is still needed Code Status:  full code  Last date of multidisciplinary goals of care discussion [04/25/2023]  Critical Care Time: 40 min   Janann Colonel, MD [facility] 04/26/2023 8:33 AM

## 2023-04-26 NOTE — Consult Note (Signed)
 PHARMACY - ANTICOAGULATION CONSULT NOTE  Pharmacy Consult for Heparin Indication: atrial fibrillation  No Known Allergies  Patient Measurements: Height: 5\' 8"  (172.7 cm) Weight: 115.5 kg (254 lb 10.1 oz) IBW/kg (Calculated) : 63.9 Heparin Dosing Weight: 88.8 kg  Vital Signs: Temp: 98.6 F (37 C) (02/15 1045) Temp Source: Bladder (02/15 0800) BP: 126/52 (02/15 1000) Pulse Rate: 85 (02/15 1045)  Labs: Recent Labs    04/24/23 1240 04/24/23 1738 04/25/23 0411 04/25/23 1443 04/25/23 1848 04/26/23 0357  HGB 12.7  --  10.6*  --   --  9.8*  HCT 38.1  --  32.1*  --   --  29.1*  PLT 309  --  342  --   --  332  LABPROT 14.3  --   --   --   --   --   INR 1.1  --   --   --   --   --   CREATININE 1.27*  --  2.38* 2.82* 3.05* 4.02*  TROPONINIHS 139* 113*  --   --   --   --     Estimated Creatinine Clearance: 17.6 mL/min (A) (by C-G formula based on SCr of 4.02 mg/dL (H)).   Medical History: Past Medical History:  Diagnosis Date   Arthritis    Asthma    Cataract of both eyes    Complication of anesthesia    woke up during surgery x1   COPD (chronic obstructive pulmonary disease) (HCC)    Coronary artery disease    DDD (degenerative disc disease), lumbar    Diabetes mellitus without complication (HCC)    Dyspnea    DUE TO COPD   Hernia of abdominal wall    History of hiatal hernia    small   Hyperlipidemia    Hypertension    Hypothyroidism    hashimotos throiditis  -thyroidectomy  2000   Occasional tremors    Oxygen desaturation    NOW PT ON 3-4 LITERS [place] DAILY    Medications:  Medications Prior to Admission  Medication Sig Dispense Refill Last Dose/Taking   albuterol (VENTOLIN HFA) 108 (90 Base) MCG/ACT inhaler Inhale 2 puffs into the lungs every 4 (four) hours as needed for wheezing or shortness of breath.   Taking As Needed   allopurinol (ZYLOPRIM) 100 MG tablet Take 100 mg by mouth every morning.   Past Week   amLODipine (NORVASC) 5 MG tablet Take 5 mg by  mouth every morning.   Past Week   atorvastatin (LIPITOR) 40 MG tablet Take 40 mg by mouth every morning.   Past Week   benazepril (LOTENSIN) 40 MG tablet Take 40 mg by mouth every morning.   Past Week   ELIQUIS 5 MG TABS tablet Take 5 mg by mouth 2 (two) times daily.   Past Week   fluticasone (FLONASE) 50 MCG/ACT nasal spray Place 2 sprays into both nostrils daily as needed for allergies.   Past Week   fluticasone-salmeterol (ADVAIR HFA) 115-21 MCG/ACT inhaler Inhale 2 puffs by mouth twice daily 12 g 0 Past Week   furosemide (LASIX) 80 MG tablet Take 80 mg by mouth every morning.   Past Week   gabapentin (NEURONTIN) 600 MG tablet Take 600 mg by mouth 3 (three) times daily.   Past Week   hydrochlorothiazide (HYDRODIURIL) 25 MG tablet Take 25 mg by mouth every morning.   Past Week   indomethacin (INDOCIN) 25 MG capsule Take 25 mg by mouth 3 (three) times daily with meals.  Past Week   levocetirizine (XYZAL) 5 MG tablet Take 5 mg by mouth every evening.   Past Week   levothyroxine (SYNTHROID) 175 MCG tablet Take 175 mcg by mouth daily before breakfast.   Past Week   methocarbamol (ROBAXIN) 500 MG tablet Take 1-2 tablets by mouth every 6 (six) hours as needed for muscle spasms.   Past Week   Metoprolol Tartrate 75 MG TABS Take 1 tablet by mouth 2 (two) times daily.   Past Week   montelukast (SINGULAIR) 10 MG tablet Take 10 mg by mouth every morning.   Past Week   MOUNJARO 10 MG/0.5ML Pen Inject 10 mg into the skin once a week.   Past Week   [facility] powder Apply 1 Application topically 2 (two) times daily.   Past Week   promethazine (PHENERGAN) 25 MG tablet Take 25 mg by mouth every 6 (six) hours as needed for nausea or vomiting.   Taking As Needed   Tiotropium Bromide Monohydrate (SPIRIVA RESPIMAT) 1.25 MCG/ACT AERS Inhale 2 puffs into the lungs daily for 30 doses. 3 each 0 Past Week   tiZANidine (ZANAFLEX) 4 MG tablet Take 4 mg by mouth 3 (three) times daily.   Past Week   tretinoin (RETIN-A) 0.05  % cream Apply to face qhs, wash off qam 45 g 11 Past Week   OXYGEN Inhale 3-4 L into the lungs daily.      Scheduled:   Chlorhexidine Gluconate Cloth  6 each Topical Daily   docusate  100 mg Per Tube BID   free water  30 mL Per Tube Q4H   hydrocortisone sod succinate (SOLU-CORTEF) inj  50 mg Intravenous Q6H   insulin aspart  0-20 Units Subcutaneous Q4H   insulin glargine-yfgn  10 Units Subcutaneous Daily   ipratropium-albuterol  3 mL Nebulization Q4H   levothyroxine  175 mcg Per Tube Q0600   multivitamin with minerals  1 tablet Per Tube Daily   mouth rinse  15 mL Mouth Rinse Q2H   oseltamivir  30 mg Per Tube Daily   pantoprazole (PROTONIX) IV  40 mg Intravenous QHS   polyethylene glycol  17 g Per Tube Daily   thiamine  100 mg Per Tube Daily   vancomycin variable dose per unstable renal function (pharmacist dosing)   Does not apply See admin instructions   Infusions:   azithromycin 500 mg (04/26/23 1115)   ceFEPime (MAXIPIME) IV Stopped (04/25/23 1431)   feeding supplement (VITAL 1.5 CAL) 30 mL/hr at 04/26/23 1047   fentaNYL infusion INTRAVENOUS 175 mcg/hr (04/26/23 1047)   heparin     norepinephrine (LEVOPHED) Adult infusion 10 mcg/min (04/26/23 1047)   propofol (DIPRIVAN) infusion 40 mcg/kg/min (04/26/23 1047)   vasopressin 0.03 Units/min (04/26/23 1047)   PRN: acetaminophen, docusate sodium, fentaNYL, fentaNYL (SUBLIMAZE) injection, fentaNYL (SUBLIMAZE) injection, mouth rinse, polyethylene glycol Anti-infectives (From admission, onward)    Start     Dose/Rate Route Frequency Ordered Stop   04/26/23 1000  oseltamivir (TAMIFLU) 6 MG/ML suspension 30 mg        30 mg Per Tube Daily 04/25/23 1017 04/30/23 0959   04/25/23 2000  vancomycin (VANCOREADY) IVPB 1250 mg/250 mL  Status:  Discontinued        1,250 mg 166.7 mL/hr over 90 Minutes Intravenous Every 24 hours 04/24/23 2106 04/25/23 1151   04/25/23 1300  ceFEPIme (MAXIPIME) 2 g in sodium chloride 0.9 % 100 mL IVPB        2  g 200 mL/hr over 30 Minutes  Intravenous Every 24 hours 04/25/23 1157     04/25/23 1200  azithromycin (ZITHROMAX) 500 mg in sodium chloride 0.9 % 250 mL IVPB        500 mg 250 mL/hr over 60 Minutes Intravenous Every 24 hours 04/24/23 1557 04/29/23 1159   04/25/23 1150  vancomycin variable dose per unstable renal function (pharmacist dosing)         Does not apply See admin instructions 04/25/23 1151     04/25/23 1100  cefTRIAXone (ROCEPHIN) 2 g in sodium chloride 0.9 % 100 mL IVPB  Status:  Discontinued        2 g 200 mL/hr over 30 Minutes Intravenous Every 24 hours 04/24/23 1557 04/25/23 1021   04/24/23 2200  oseltamivir (TAMIFLU) 6 MG/ML suspension 30 mg  Status:  Discontinued        30 mg Per Tube 2 times daily 04/24/23 1559 04/25/23 1017   04/24/23 2045  vancomycin (VANCOREADY) IVPB 2000 mg/400 mL        2,000 mg 200 mL/hr over 120 Minutes Intravenous  Once 04/24/23 2034 04/25/23 0045   04/24/23 1245  cefTRIAXone (ROCEPHIN) 2 g in sodium chloride 0.9 % 100 mL IVPB        2 g 200 mL/hr over 30 Minutes Intravenous Once 04/24/23 1244 04/24/23 1407   04/24/23 1245  azithromycin (ZITHROMAX) 500 mg in sodium chloride 0.9 % 250 mL IVPB        500 mg 250 mL/hr over 60 Minutes Intravenous  Once 04/24/23 1244 04/24/23 1513       Assessment: 68 year old female with a past medical history significant for COPD on home supplemental oxygen and diabetes mellitus who presented to [facility] ED on 04/24/2023 due to unresponsiveness. PMH of afib on apixaban. Hgb is trending down. Last dose heparin sq 2/15 @0522 . CHADSVASc 3. Last dose apixaban unknown. Will order a baseline heparin level.  Goal of Therapy:  Heparin level 0.3-0.7 units/ml aPTT 66-102 seconds Monitor platelets by anticoagulation protocol: Yes   Plan:  No bolus due to trending down hemoglobin.  Start heparin infusion at 1200 units/hr Check aPTT level in 8 hours and daily while on heparin Continue to monitor H&H and platelets If baseline  heparin level is not falsely elevated switch to heparin level monitoring. Will use aPTT until heparin level and aPTT correlate and then switch to heparin level.   Ronnald Ramp, PharmD, BCPS 04/26/2023,11:48 AM

## 2023-04-26 NOTE — Plan of Care (Signed)
  Problem: Clinical Measurements: Goal: Ability to maintain clinical measurements within normal limits will improve Outcome: Progressing Goal: Will remain free from infection Outcome: Progressing Goal: Diagnostic test results will improve Outcome: Progressing Goal: Respiratory complications will improve Outcome: Progressing   Problem: Nutrition: Goal: Adequate nutrition will be maintained Outcome: Progressing   Problem: Coping: Goal: Level of anxiety will decrease Outcome: Progressing   Problem: Pain Managment: Goal: General experience of comfort will improve and/or be controlled Outcome: Progressing   Problem: Safety: Goal: Ability to remain free from injury will improve Outcome: Progressing   Problem: Coping: Goal: Ability to adjust to condition or change in health will improve Outcome: Progressing   Problem: Skin Integrity: Goal: Risk for impaired skin integrity will decrease Outcome: Progressing

## 2023-04-26 NOTE — Consult Note (Signed)
 PHARMACY - ANTICOAGULATION CONSULT NOTE  Pharmacy Consult for Heparin Infusion Indication: atrial fibrillation  No Known Allergies  Patient Measurements: Height: 5\' 8"  (172.7 cm) Weight: 115.5 kg (254 lb 10.1 oz) IBW/kg (Calculated) : 63.9 Heparin Dosing Weight: 88.8 kg  Vital Signs: Temp: 97.9 F (36.6 C) (02/15 2000) Temp Source: Bladder (02/15 1930) BP: 125/49 (02/15 2000) Pulse Rate: 69 (02/15 2000)  Labs: Recent Labs    04/24/23 1240 04/24/23 1738 04/25/23 0411 04/25/23 1443 04/25/23 1848 04/26/23 0357 04/26/23 1155 04/26/23 1824 04/26/23 1947  HGB 12.7  --  10.6*  --   --  9.8*  --   --   --   HCT 38.1  --  32.1*  --   --  29.1*  --   --   --   PLT 309  --  342  --   --  332  --   --   --   APTT  --   --   --   --   --   --   --   --  45*  LABPROT 14.3  --   --   --   --   --   --   --   --   INR 1.1  --   --   --   --   --   --   --   --   HEPARINUNFRC  --   --   --   --   --   --  0.67  --   --   CREATININE 1.27*  --  2.38*   < > 3.05* 4.02*  --  4.73*  --   TROPONINIHS 139* 113*  --   --   --   --   --   --   --    < > = values in this interval not displayed.    Estimated Creatinine Clearance: 15 mL/min (A) (by C-G formula based on SCr of 4.73 mg/dL (H)).   Medical History: Past Medical History:  Diagnosis Date   Arthritis    Asthma    Cataract of both eyes    Complication of anesthesia    woke up during surgery x1   COPD (chronic obstructive pulmonary disease) (HCC)    Coronary artery disease    DDD (degenerative disc disease), lumbar    Diabetes mellitus without complication (HCC)    Dyspnea    DUE TO COPD   Hernia of abdominal wall    History of hiatal hernia    small   Hyperlipidemia    Hypertension    Hypothyroidism    hashimotos throiditis  -thyroidectomy  2000   Occasional tremors    Oxygen desaturation    NOW PT ON 3-4 LITERS [place] DAILY    Medications:  Medications Prior to Admission  Medication Sig Dispense Refill Last  Dose/Taking   albuterol (VENTOLIN HFA) 108 (90 Base) MCG/ACT inhaler Inhale 2 puffs into the lungs every 4 (four) hours as needed for wheezing or shortness of breath.   Taking As Needed   allopurinol (ZYLOPRIM) 100 MG tablet Take 100 mg by mouth every morning.   Past Week   amLODipine (NORVASC) 5 MG tablet Take 5 mg by mouth every morning.   Past Week   atorvastatin (LIPITOR) 40 MG tablet Take 40 mg by mouth every morning.   Past Week   benazepril (LOTENSIN) 40 MG tablet Take 40 mg by mouth every morning.   Past Week  ELIQUIS 5 MG TABS tablet Take 5 mg by mouth 2 (two) times daily.   Past Week   fluticasone (FLONASE) 50 MCG/ACT nasal spray Place 2 sprays into both nostrils daily as needed for allergies.   Past Week   fluticasone-salmeterol (ADVAIR HFA) 115-21 MCG/ACT inhaler Inhale 2 puffs by mouth twice daily 12 g 0 Past Week   furosemide (LASIX) 80 MG tablet Take 80 mg by mouth every morning.   Past Week   gabapentin (NEURONTIN) 600 MG tablet Take 600 mg by mouth 3 (three) times daily.   Past Week   hydrochlorothiazide (HYDRODIURIL) 25 MG tablet Take 25 mg by mouth every morning.   Past Week   indomethacin (INDOCIN) 25 MG capsule Take 25 mg by mouth 3 (three) times daily with meals.   Past Week   levocetirizine (XYZAL) 5 MG tablet Take 5 mg by mouth every evening.   Past Week   levothyroxine (SYNTHROID) 175 MCG tablet Take 175 mcg by mouth daily before breakfast.   Past Week   methocarbamol (ROBAXIN) 500 MG tablet Take 1-2 tablets by mouth every 6 (six) hours as needed for muscle spasms.   Past Week   Metoprolol Tartrate 75 MG TABS Take 1 tablet by mouth 2 (two) times daily.   Past Week   montelukast (SINGULAIR) 10 MG tablet Take 10 mg by mouth every morning.   Past Week   MOUNJARO 10 MG/0.5ML Pen Inject 10 mg into the skin once a week.   Past Week   [facility] powder Apply 1 Application topically 2 (two) times daily.   Past Week   promethazine (PHENERGAN) 25 MG tablet Take 25 mg by mouth every 6  (six) hours as needed for nausea or vomiting.   Taking As Needed   Tiotropium Bromide Monohydrate (SPIRIVA RESPIMAT) 1.25 MCG/ACT AERS Inhale 2 puffs into the lungs daily for 30 doses. 3 each 0 Past Week   tiZANidine (ZANAFLEX) 4 MG tablet Take 4 mg by mouth 3 (three) times daily.   Past Week   tretinoin (RETIN-A) 0.05 % cream Apply to face qhs, wash off qam 45 g 11 Past Week   OXYGEN Inhale 3-4 L into the lungs daily.       Assessment: Sarah Norton is a 68 y.o. female presenting with unresponsiveness. PMH significant for COPD, T2DM, AF on apixaban, HTN. Patient was on [facility] PTA per chart review. Last dose heparin SQ was 2/15 @ 0522. CHADSVASc 3. Last dose apixaban unknown. Pharmacy has been consulted to initiate and manage heparin infusion.   Baseline Labs: HL 0.67, Hgb 9.8, Hct 29.1, Plt 332   Goal of Therapy:  Heparin level 0.3-0.7 units/ml aPTT 66-102 seconds Monitor platelets by anticoagulation protocol: Yes  Date Time aPTT/HL Rate/Comment  2/15 1947 45/---  1200/subtherapeutic   Plan:  Give 1350 units bolus x 1 (bolus reduced 50% given downtrending Hgb) Increase heparin infusion to 1500 units/hr Check aPTT in 8 hours Continue to monitor aPTT until HL and aPTT correlate.  Check HL daily for correlation until HL and aPTT correlate. Switch to HL monitoring once HL and aPTT correlate.  Continue to monitor H&H and platelets daily while on heparin infusion    Celene Squibb, PharmD Clinical Pharmacist 04/26/2023 10:03 PM

## 2023-04-26 NOTE — Procedures (Signed)
 Central Venous Catheter Insertion Procedure Note  AHNYA AKRE  [id number]  1955/10/09  Date:04/26/23  Time:7:07 PM   Provider Performing:Jean-Pierre Law Corsino   Procedure: Insertion of Non-tunneled Central Venous Catheter(36556)with US guidance ([id number])    Indication(s) Hemodialysis  Consent Risks of the procedure as well as the alternatives and risks of each were explained to the patient and/or caregiver.  Consent for the procedure was obtained and is signed in the bedside chart  Anesthesia Topical only with 1% lidocaine   Timeout Verified patient identification, verified procedure, site/side was marked, verified correct patient position, special equipment/implants available, medications/allergies/relevant history reviewed, required imaging and test results available.  Sterile Technique Maximal sterile technique including full sterile barrier drape, hand hygiene, sterile gown, sterile gloves, mask, hair covering, sterile ultrasound probe cover (if used).  Procedure Description Area of catheter insertion was cleaned with chlorhexidine and draped in sterile fashion.   With real-time ultrasound guidance a HD catheter was placed into the right internal jugular vein.  Nonpulsatile blood flow and easy flushing noted in all ports.  The catheter was sutured in place and sterile dressing applied.  Complications/Tolerance None; patient tolerated the procedure well. Chest X-ray is ordered to verify placement for internal jugular or subclavian cannulation.  Chest x-ray is not ordered for femoral cannulation.  EBL Minimal  Specimen(s) None   Janann Colonel, MD [facility] 04/26/2023 7:07 PM

## 2023-04-27 DIAGNOSIS — J441 Chronic obstructive pulmonary disease with (acute) exacerbation: Secondary | ICD-10-CM | POA: Diagnosis not present

## 2023-04-27 DIAGNOSIS — J80 Acute respiratory distress syndrome: Secondary | ICD-10-CM | POA: Diagnosis not present

## 2023-04-27 DIAGNOSIS — A419 Sepsis, unspecified organism: Secondary | ICD-10-CM | POA: Diagnosis not present

## 2023-04-27 DIAGNOSIS — J09X2 Influenza due to identified novel influenza A virus with other respiratory manifestations: Secondary | ICD-10-CM | POA: Diagnosis not present

## 2023-04-27 LAB — BLOOD GAS, ARTERIAL
Acid-base deficit: 3.6 mmol/L — ABNORMAL HIGH (ref 0.0–2.0)
Acid-base deficit: 4.6 mmol/L — ABNORMAL HIGH (ref 0.0–2.0)
Acid-base deficit: 3.2 mmol/L — ABNORMAL HIGH (ref 0.0–2.0)
Bicarbonate: 23.9 mmol/L (ref 20.0–28.0)
Bicarbonate: 23.2 mmol/L (ref 20.0–28.0)
Bicarbonate: 24.7 mmol/L (ref 20.0–28.0)
FIO2: 50 %
FIO2: 50 %
FIO2: 50 %
MECHVT: 540 mL
O2 Saturation: 100 %
O2 Saturation: 99.3 %
O2 Saturation: 98.2 %
PEEP: 12 cmH2O
PEEP: 10 cmH2O
PEEP: 10 cmH2O
Patient temperature: 37
Patient temperature: 37
Patient temperature: 37
RATE: 24 {breaths}/min
RATE: 22 {breaths}/min
RATE: 22 {breaths}/min
Spontaneous VT: 540 mL
Spontaneous VT: 540 mL
pCO2 arterial: 52 mm[Hg] — ABNORMAL HIGH (ref 32–48)
pCO2 arterial: 53 mm[Hg] — ABNORMAL HIGH (ref 32–48)
pCO2 arterial: 55 mm[Hg] — ABNORMAL HIGH (ref 32–48)
pH, Arterial: 7.27 — ABNORMAL LOW (ref 7.35–7.45)
pH, Arterial: 7.25 — ABNORMAL LOW (ref 7.35–7.45)
pH, Arterial: 7.26 — ABNORMAL LOW (ref 7.35–7.45)
pO2, Arterial: 134 mm[Hg] — ABNORMAL HIGH (ref 83–108)
pO2, Arterial: 103 mm[Hg] (ref 83–108)
pO2, Arterial: 94 mm[Hg] (ref 83–108)

## 2023-04-27 LAB — LEGIONELLA PNEUMOPHILA SEROGP 1 UR AG: L. pneumophila Serogp 1 Ur Ag: NEGATIVE

## 2023-04-27 LAB — RENAL FUNCTION PANEL
Albumin: 2.2 g/dL — ABNORMAL LOW (ref 3.5–5.0)
Albumin: 2.3 g/dL — ABNORMAL LOW (ref 3.5–5.0)
Anion gap: 14 (ref 5–15)
Anion gap: 11 (ref 5–15)
BUN: 72 mg/dL — ABNORMAL HIGH (ref 8–23)
BUN: 52 mg/dL — ABNORMAL HIGH (ref 8–23)
CO2: 21 mmol/L — ABNORMAL LOW (ref 22–32)
CO2: 23 mmol/L (ref 22–32)
Calcium: 7.8 mg/dL — ABNORMAL LOW (ref 8.9–10.3)
Calcium: 8 mg/dL — ABNORMAL LOW (ref 8.9–10.3)
Chloride: 99 mmol/L (ref 98–111)
Chloride: 100 mmol/L (ref 98–111)
Creatinine, Ser: 3.86 mg/dL — ABNORMAL HIGH (ref 0.44–1.00)
Creatinine, Ser: 2.87 mg/dL — ABNORMAL HIGH (ref 0.44–1.00)
GFR, Estimated: 12 mL/min — ABNORMAL LOW (ref >60)
GFR, Estimated: 17 mL/min — ABNORMAL LOW (ref >60)
Glucose, Bld: 168 mg/dL — ABNORMAL HIGH (ref 70–99)
Glucose, Bld: 258 mg/dL — ABNORMAL HIGH (ref 70–99)
Phosphorus: 3 mg/dL (ref 2.5–4.6)
Phosphorus: 3 mg/dL (ref 2.5–4.6)
Potassium: 4.2 mmol/L (ref 3.5–5.1)
Potassium: 4.5 mmol/L (ref 3.5–5.1)
Sodium: 134 mmol/L — ABNORMAL LOW (ref 135–145)
Sodium: 134 mmol/L — ABNORMAL LOW (ref 135–145)

## 2023-04-27 LAB — CBC
HCT: 28.2 % — ABNORMAL LOW (ref 36.0–46.0)
Hemoglobin: 9.5 g/dL — ABNORMAL LOW (ref 12.0–15.0)
MCH: 30.2 pg (ref 26.0–34.0)
MCHC: 33.7 g/dL (ref 30.0–36.0)
MCV: 89.5 fL (ref 80.0–100.0)
Platelets: 325 10*3/uL (ref 150–400)
RBC: 3.15 MIL/uL — ABNORMAL LOW (ref 3.87–5.11)
RDW: 16.3 % — ABNORMAL HIGH (ref 11.5–15.5)
WBC: 41.5 10*3/uL — ABNORMAL HIGH (ref 4.0–10.5)
nRBC: 0.3 % — ABNORMAL HIGH (ref 0.0–0.2)

## 2023-04-27 LAB — APTT
aPTT: 72 s — ABNORMAL HIGH (ref 24–36)
aPTT: 67 s — ABNORMAL HIGH (ref 24–36)
aPTT: 62 s — ABNORMAL HIGH (ref 24–36)

## 2023-04-27 LAB — GLUCOSE, CAPILLARY
Glucose-Capillary: 151 mg/dL — ABNORMAL HIGH (ref 70–99)
Glucose-Capillary: 190 mg/dL — ABNORMAL HIGH (ref 70–99)
Glucose-Capillary: 218 mg/dL — ABNORMAL HIGH (ref 70–99)
Glucose-Capillary: 223 mg/dL — ABNORMAL HIGH (ref 70–99)
Glucose-Capillary: 236 mg/dL — ABNORMAL HIGH (ref 70–99)
Glucose-Capillary: 206 mg/dL — ABNORMAL HIGH (ref 70–99)

## 2023-04-27 LAB — CULTURE, BLOOD (ROUTINE X 2)

## 2023-04-27 LAB — VANCOMYCIN, RANDOM: Vancomycin Rm: 12 ug/mL

## 2023-04-27 LAB — HEPARIN LEVEL (UNFRACTIONATED): Heparin Unfractionated: 0.81 [IU]/mL — ABNORMAL HIGH (ref 0.30–0.70)

## 2023-04-27 LAB — MAGNESIUM: Magnesium: 2.2 mg/dL (ref 1.7–2.4)

## 2023-04-27 MED ORDER — SODIUM CHLORIDE 0.9% FLUSH
10.0000 mL | Freq: Two times a day (BID) | INTRAVENOUS | Status: DC
  Administered 2023-04-27 – 2023-05-01 (×10): 10 mL
  Administered 2023-05-01: 30 mL
  Administered 2023-05-02: 10 mL
  Administered 2023-05-02: 20 mL
  Administered 2023-05-03: 10 mL
  Administered 2023-05-03: 20 mL
  Administered 2023-05-04 (×2): 10 mL
  Administered 2023-05-05 – 2023-05-06 (×2): 30 mL
  Administered 2023-05-06 – 2023-05-07 (×2): 10 mL
  Administered 2023-05-08: 30 mL
  Administered 2023-05-08 – 2023-05-15 (×13): 10 mL

## 2023-04-27 MED ORDER — INSULIN GLARGINE-YFGN 100 UNIT/ML ~~LOC~~ SOLN
10.0000 [IU] | Freq: Two times a day (BID) | SUBCUTANEOUS | Status: DC
  Administered 2023-04-27 – 2023-04-28 (×2): 10 [IU] via SUBCUTANEOUS
  Filled 2023-04-27 (×2): qty 0.1

## 2023-04-27 MED ORDER — SODIUM CHLORIDE 0.9% FLUSH: 10.0000 mL | INTRAVENOUS | Status: DC | PRN

## 2023-04-27 MED ORDER — RENA-VITE PO TABS
1.0000 | ORAL_TABLET | Freq: Every day | ORAL | Status: DC
  Administered 2023-04-28 – 2023-05-03 (×6): 1
  Filled 2023-04-27 (×6): qty 1

## 2023-04-27 MED ORDER — HEPARIN BOLUS VIA INFUSION
1300.0000 [IU] | Freq: Once | INTRAVENOUS | Status: AC
  Administered 2023-04-27: 1300 [IU] via INTRAVENOUS
  Filled 2023-04-27: qty 1300

## 2023-04-27 MED ORDER — PROSOURCE TF20 ENFIT COMPATIBL EN LIQD
60.0000 mL | Freq: Four times a day (QID) | ENTERAL | Status: DC
  Administered 2023-04-27 – 2023-04-28 (×5): 60 mL
  Filled 2023-04-27 (×6): qty 60

## 2023-04-27 MED ORDER — VANCOMYCIN HCL IN DEXTROSE 1-5 GM/200ML-% IV SOLN
1000.0000 mg | Freq: Once | INTRAVENOUS | Status: AC
  Administered 2023-04-27: 1000 mg via INTRAVENOUS
  Filled 2023-04-27: qty 200

## 2023-04-27 NOTE — Consult Note (Signed)
 PHARMACY - ANTICOAGULATION CONSULT NOTE  Pharmacy Consult for Heparin Infusion Indication: atrial fibrillation  No Known Allergies  Patient Measurements: Height: 5\' 8"  (172.7 cm) Weight: 117.4 kg (258 lb 13.1 oz) IBW/kg (Calculated) : 63.9 Heparin Dosing Weight: 88.8 kg  Vital Signs: Temp: 99.7 F (37.6 C) (02/16 2145) Temp Source: Bladder (02/16 1930) BP: 146/54 (02/16 2100) Pulse Rate: 71 (02/16 2145)  Labs: Recent Labs    04/25/23 0411 04/25/23 1443 04/26/23 0357 04/26/23 1155 04/26/23 1824 04/26/23 1947 04/27/23 0424 04/27/23 0552 04/27/23 1336 04/27/23 1551 04/27/23 2115  HGB 10.6*  --  9.8*  --   --   --  9.5*  --   --   --   --   HCT 32.1*  --  29.1*  --   --   --  28.2*  --   --   --   --   PLT 342  --  332  --   --   --  325  --   --   --   --   APTT  --   --   --   --   --    < >  --  72* 67*  --  62*  HEPARINUNFRC  --   --   --  0.67  --   --   --  0.81*  --   --   --   CREATININE 2.38*   < > 4.02*  --  4.73*  --  3.86*  --   --  2.87*  --    < > = values in this interval not displayed.   Estimated Creatinine Clearance: 24.9 mL/min (A) (by C-G formula based on SCr of 2.87 mg/dL (H)).  Medical History: Past Medical History:  Diagnosis Date   Arthritis    Asthma    Cataract of both eyes    Complication of anesthesia    woke up during surgery x1   COPD (chronic obstructive pulmonary disease) (HCC)    Coronary artery disease    DDD (degenerative disc disease), lumbar    Diabetes mellitus without complication (HCC)    Dyspnea    DUE TO COPD   Hernia of abdominal wall    History of hiatal hernia    small   Hyperlipidemia    Hypertension    Hypothyroidism    hashimotos throiditis  -thyroidectomy  2000   Occasional tremors    Oxygen desaturation    NOW PT ON 3-4 LITERS [place] DAILY   Medications:  Medications Prior to Admission  Medication Sig Dispense Refill Last Dose/Taking   albuterol (VENTOLIN HFA) 108 (90 Base) MCG/ACT inhaler Inhale 2 puffs  into the lungs every 4 (four) hours as needed for wheezing or shortness of breath.   Taking As Needed   allopurinol (ZYLOPRIM) 100 MG tablet Take 100 mg by mouth every morning.   Past Week   amLODipine (NORVASC) 5 MG tablet Take 5 mg by mouth every morning.   Past Week   atorvastatin (LIPITOR) 40 MG tablet Take 40 mg by mouth every morning.   Past Week   benazepril (LOTENSIN) 40 MG tablet Take 40 mg by mouth every morning.   Past Week   ELIQUIS 5 MG TABS tablet Take 5 mg by mouth 2 (two) times daily.   Past Week   fluticasone (FLONASE) 50 MCG/ACT nasal spray Place 2 sprays into both nostrils daily as needed for allergies.   Past Week   fluticasone-salmeterol (ADVAIR HFA)  115-21 MCG/ACT inhaler Inhale 2 puffs by mouth twice daily 12 g 0 Past Week   furosemide (LASIX) 80 MG tablet Take 80 mg by mouth every morning.   Past Week   gabapentin (NEURONTIN) 600 MG tablet Take 600 mg by mouth 3 (three) times daily.   Past Week   hydrochlorothiazide (HYDRODIURIL) 25 MG tablet Take 25 mg by mouth every morning.   Past Week   indomethacin (INDOCIN) 25 MG capsule Take 25 mg by mouth 3 (three) times daily with meals.   Past Week   levocetirizine (XYZAL) 5 MG tablet Take 5 mg by mouth every evening.   Past Week   levothyroxine (SYNTHROID) 175 MCG tablet Take 175 mcg by mouth daily before breakfast.   Past Week   methocarbamol (ROBAXIN) 500 MG tablet Take 1-2 tablets by mouth every 6 (six) hours as needed for muscle spasms.   Past Week   Metoprolol Tartrate 75 MG TABS Take 1 tablet by mouth 2 (two) times daily.   Past Week   montelukast (SINGULAIR) 10 MG tablet Take 10 mg by mouth every morning.   Past Week   MOUNJARO 10 MG/0.5ML Pen Inject 10 mg into the skin once a week.   Past Week   [facility] powder Apply 1 Application topically 2 (two) times daily.   Past Week   promethazine (PHENERGAN) 25 MG tablet Take 25 mg by mouth every 6 (six) hours as needed for nausea or vomiting.   Taking As Needed   Tiotropium  Bromide Monohydrate (SPIRIVA RESPIMAT) 1.25 MCG/ACT AERS Inhale 2 puffs into the lungs daily for 30 doses. 3 each 0 Past Week   tiZANidine (ZANAFLEX) 4 MG tablet Take 4 mg by mouth 3 (three) times daily.   Past Week   tretinoin (RETIN-A) 0.05 % cream Apply to face qhs, wash off qam 45 g 11 Past Week   OXYGEN Inhale 3-4 L into the lungs daily.      Assessment: 68 year old female with a past medical history significant for COPD on home supplemental oxygen and diabetes mellitus who presented to [facility] ED on 04/24/2023 due to unresponsiveness. PMH of afib on apixaban. Hgb is trending down. Last dose heparin sq 2/15 @0522 . CHADSVASc 3. Last dose apixaban unknown. Baseline heparin level was 0.67, possibly has some false elevated due to apixaban.     Date Time aPTT/HL Rate/Comment  2/15 1947 45/---  1200/SUBtherapeutic 2/16 0552 72/0.81 1500/aPTT therapeutic  2/16    1336     67/--                1500/aPTT therapeutic    2/16 2115 62/--  1500/aPTT SUBtherapeutic  Goal of Therapy:  Heparin level 0.3-0.7 units/ml aPTT 66-102 seconds Monitor platelets by anticoagulation protocol: Yes  Plan:  Bolus 1300 units x 1 Increase heparin infusion to 1650 units/hr  Will continue with aPTT monitoring until correlation with HL  Check next aPTT/HL in 8 hours Monitor CBC and HL daily   Bettey Costa, PharmD Clinical Pharmacist 04/27/2023 10:44 PM

## 2023-04-27 NOTE — Progress Notes (Signed)
 Sedation decreased per MD order. RT and RN in room assessing patient, pt awake and coughing, difficult to redirect. Fentanyl and propofol titrated and PRN bolus of fentanyl administered per order.

## 2023-04-27 NOTE — Progress Notes (Signed)
 Nutrition Follow-up  DOCUMENTATION CODES:   Obesity unspecified  INTERVENTION:   -TF via OGT:    Vital 1.5 @ 30 ml/hr   60 ml Prosource TF 4 times daily    30 ml free water flush every 4 hours   Tube feeding regimen provides 1400 kcal (100% of needs), 129 grams of protein, and 583 ml of H2O. Total free water: 764 ml daily  -D/c MVI with minerals daily via tube x 10 days -Renal MVI daily via tube -100 mg thiamine daily x 7 days via tube -Monitor Mg, K, and Phos and replete as needed secondary to high refeeding risk -Daily weights   NUTRITION DIAGNOSIS:   Inadequate oral intake related to inability to eat as evidenced by NPO status.  Onoging  GOAL:   Provide needs based on ASPEN/SCCM guidelines  Met with TF  MONITOR:   TF tolerance  REASON FOR ASSESSMENT:   Consult Assessment of nutrition requirement/status  ASSESSMENT:   Pt with a past medical history significant for COPD on home supplemental oxygen and diabetes mellitus who presented due to unresponsiveness.  2/13- intubated secondary to worsening ABG and mental status  2/15- rectal tube placed, CCRT initiated, TF increased to goal  Patient is currently intubated on ventilator support. Per KUB on 04/24/23, tip of OGT confirmed in stomach.  MV: 13 L/min Temp (24hrs), Avg:98.3 F (36.8 C), Min:97.7 F (36.5 C), Max:99.5 F (37.5 C)  Propofol: 22.5 ml/hr (provides 619 kcals daily)  MAP: 74  Reviewed I/O's: +2.2 L x 24 hours  UOP: 60 ml x 24 hours  Rectal tube output: 230 ml x 24 hours  CRRT: +1.2 L x 24 hours  Medications reviewed and include colace and miralax (held this AM due to loose stools), solu-cortef, levophed, pitressin, propofol, and thiamine.  Labs reviewed: Na: 134, K, Mg, and Phos WDL, CBGS: 218-223 (inpatient orders for glycemic control are 0-20 units insulin aspart every 4 hours and 10 units insulin glargine-yfgn BID).    Diet Order:   Diet Order             Diet NPO time  specified  Diet effective now                   EDUCATION NEEDS:   Not appropriate for education at this time  Skin:  Skin Assessment: Skin Integrity Issues: Skin Integrity Issues:: Stage I Stage I: anus  Last BM:  04/27/23 (via rectal tube)  Height:   Ht Readings from Last 1 Encounters:  04/24/23 5\' 8"  (1.727 m)    Weight:   Wt Readings from Last 1 Encounters:  04/27/23 117.4 kg    Ideal Body Weight:  63.6 kg  BMI:  Body mass index is 39.35 kg/m.  Estimated Nutritional Needs:   Kcal:  4010-2725  Protein:  > 127 grams  Fluid:  1.2-1.5 L    Levada Schilling, RD, LDN, CDCES Registered Dietitian III Certified Diabetes Care and Education Specialist If unable to reach this RD, please use "RD Inpatient" group chat on secure chat between hours of 8am-4 pm daily

## 2023-04-27 NOTE — Consult Note (Signed)
 PHARMACY - ANTICOAGULATION CONSULT NOTE  Pharmacy Consult for Heparin Infusion Indication: atrial fibrillation  No Known Allergies  Patient Measurements: Height: 5\' 8"  (172.7 cm) Weight: 117.4 kg (258 lb 13.1 oz) IBW/kg (Calculated) : 63.9 Heparin Dosing Weight: 88.8 kg  Vital Signs: Temp: 99.3 F (37.4 C) (02/16 1400) Temp Source: Bladder (02/16 1200) BP: 142/51 (02/16 1400) Pulse Rate: 71 (02/16 1400)  Labs: Recent Labs     0000 04/24/23 1738 04/25/23 0411 04/25/23 1443 04/26/23 0357 04/26/23 1155 04/26/23 1824 04/26/23 1947 04/27/23 0424 04/27/23 0552 04/27/23 1336  HGB   < >  --  10.6*  --  9.8*  --   --   --  9.5*  --   --   HCT  --   --  32.1*  --  29.1*  --   --   --  28.2*  --   --   PLT  --   --  342  --  332  --   --   --  325  --   --   APTT  --   --   --   --   --   --   --  45*  --  72* 67*  HEPARINUNFRC  --   --   --   --   --  0.67  --   --   --  0.81*  --   CREATININE  --   --  2.38*   < > 4.02*  --  4.73*  --  3.86*  --   --   TROPONINIHS  --  113*  --   --   --   --   --   --   --   --   --    < > = values in this interval not displayed.   Estimated Creatinine Clearance: 18.5 mL/min (A) (by C-G formula based on SCr of 3.86 mg/dL (H)).  Medical History: Past Medical History:  Diagnosis Date   Arthritis    Asthma    Cataract of both eyes    Complication of anesthesia    woke up during surgery x1   COPD (chronic obstructive pulmonary disease) (HCC)    Coronary artery disease    DDD (degenerative disc disease), lumbar    Diabetes mellitus without complication (HCC)    Dyspnea    DUE TO COPD   Hernia of abdominal wall    History of hiatal hernia    small   Hyperlipidemia    Hypertension    Hypothyroidism    hashimotos throiditis  -thyroidectomy  2000   Occasional tremors    Oxygen desaturation    NOW PT ON 3-4 LITERS [place] DAILY   Medications:  Medications Prior to Admission  Medication Sig Dispense Refill Last Dose/Taking   albuterol  (VENTOLIN HFA) 108 (90 Base) MCG/ACT inhaler Inhale 2 puffs into the lungs every 4 (four) hours as needed for wheezing or shortness of breath.   Taking As Needed   allopurinol (ZYLOPRIM) 100 MG tablet Take 100 mg by mouth every morning.   Past Week   amLODipine (NORVASC) 5 MG tablet Take 5 mg by mouth every morning.   Past Week   atorvastatin (LIPITOR) 40 MG tablet Take 40 mg by mouth every morning.   Past Week   benazepril (LOTENSIN) 40 MG tablet Take 40 mg by mouth every morning.   Past Week   ELIQUIS 5 MG TABS tablet Take 5 mg by mouth 2 (  two) times daily.   Past Week   fluticasone (FLONASE) 50 MCG/ACT nasal spray Place 2 sprays into both nostrils daily as needed for allergies.   Past Week   fluticasone-salmeterol (ADVAIR HFA) 115-21 MCG/ACT inhaler Inhale 2 puffs by mouth twice daily 12 g 0 Past Week   furosemide (LASIX) 80 MG tablet Take 80 mg by mouth every morning.   Past Week   gabapentin (NEURONTIN) 600 MG tablet Take 600 mg by mouth 3 (three) times daily.   Past Week   hydrochlorothiazide (HYDRODIURIL) 25 MG tablet Take 25 mg by mouth every morning.   Past Week   indomethacin (INDOCIN) 25 MG capsule Take 25 mg by mouth 3 (three) times daily with meals.   Past Week   levocetirizine (XYZAL) 5 MG tablet Take 5 mg by mouth every evening.   Past Week   levothyroxine (SYNTHROID) 175 MCG tablet Take 175 mcg by mouth daily before breakfast.   Past Week   methocarbamol (ROBAXIN) 500 MG tablet Take 1-2 tablets by mouth every 6 (six) hours as needed for muscle spasms.   Past Week   Metoprolol Tartrate 75 MG TABS Take 1 tablet by mouth 2 (two) times daily.   Past Week   montelukast (SINGULAIR) 10 MG tablet Take 10 mg by mouth every morning.   Past Week   MOUNJARO 10 MG/0.5ML Pen Inject 10 mg into the skin once a week.   Past Week   [facility] powder Apply 1 Application topically 2 (two) times daily.   Past Week   promethazine (PHENERGAN) 25 MG tablet Take 25 mg by mouth every 6 (six) hours as needed for  nausea or vomiting.   Taking As Needed   Tiotropium Bromide Monohydrate (SPIRIVA RESPIMAT) 1.25 MCG/ACT AERS Inhale 2 puffs into the lungs daily for 30 doses. 3 each 0 Past Week   tiZANidine (ZANAFLEX) 4 MG tablet Take 4 mg by mouth 3 (three) times daily.   Past Week   tretinoin (RETIN-A) 0.05 % cream Apply to face qhs, wash off qam 45 g 11 Past Week   OXYGEN Inhale 3-4 L into the lungs daily.      Assessment: 68 year old female with a past medical history significant for COPD on home supplemental oxygen and diabetes mellitus who presented to [facility] ED on 04/24/2023 due to unresponsiveness. PMH of afib on apixaban. Hgb is trending down. Last dose heparin sq 2/15 @0522 . CHADSVASc 3. Last dose apixaban unknown. Baseline heparin level was 0.67, possibly has some false elevated due to apixaban.     Date Time aPTT/HL Rate/Comment  2/15 1947 45/---  1200/subtherapeutic 2/16 0552 72/0.81 1500/aPTT therapeutic  2/16    1336     67/--                1500/aPTT therapeutic     Goal of Therapy:  Heparin level 0.3-0.7 units/ml aPTT 66-102 seconds Monitor platelets by anticoagulation protocol: Yes  Plan:  aPTT remains therapeutic, but heparin level is supratherapeutic  Continue heparin infusion at 1500 units/hr  Will continue with aPTT monitoring until correlation with HL  Check next aPTT in 8 hours Monitor CBC and HL daily   Littie Deeds, PharmD Pharmacy Resident  04/27/2023 2:20 PM

## 2023-04-27 NOTE — Plan of Care (Signed)
 Patient continues to be intubated and sedated with propofol and fentanyl; pt responds to pain but not able to follow commands. Levophed and vasopressin titrated per order to keep MAP>65. Patient is also on heparin gtt, managed by pharmacy.  CRRT initiated this shift, patient tolerating treatment; net goal is 0.  Tube feeds continue at goal rate of 30 ml/hr. Patient had a total of 40 ml of urine output and 200 ml of stool via rectal tube. Vent settings remain unchanged this shift. Oral care provided with repositioning.   Problem: Education: Goal: Knowledge of General Education information will improve Description: Including pain rating scale, medication(s)/side effects and non-pharmacologic comfort measures Outcome: Not Progressing   Problem: Clinical Measurements: Goal: Ability to maintain clinical measurements within normal limits will improve Outcome: Progressing Goal: Respiratory complications will improve Outcome: Not Progressing Goal: Cardiovascular complication will be avoided Outcome: Progressing   Problem: Activity: Goal: Risk for activity intolerance will decrease Outcome: Not Progressing   Problem: Nutrition: Goal: Adequate nutrition will be maintained Outcome: Progressing   Problem: Coping: Goal: Level of anxiety will decrease Outcome: Progressing   Problem: Elimination: Goal: Will not experience complications related to bowel motility Outcome: Progressing Goal: Will not experience complications related to urinary retention Outcome: Progressing   Problem: Pain Managment: Goal: General experience of comfort will improve and/or be controlled Outcome: Progressing   Problem: Safety: Goal: Ability to remain free from injury will improve Outcome: Progressing   Problem: Skin Integrity: Goal: Risk for impaired skin integrity will decrease Outcome: Progressing

## 2023-04-27 NOTE — Consult Note (Signed)
 Pharmacy Antibiotic Note  Sarah Norton is a 68 y.o. female admitted on 04/24/2023 with pneumonia. They have a past medical history significant for COPD on home oxygen. They presented due to unresponsiveness and was intubated during this admission. Patient with air trapping. Pharmacy has been consulted for Vancomycin and Cefepime dosing.  Today, 04/27/2023 Scr 4.73 > 3.86 (baseline ~ 0.72), now on CRRT.  WBC 43.9 > 46 > 41 afeb last 24 hours 2/14: Persistent bibasilar airspace disease; more prominent on left compared to right   Plan: Day 4 of antibiotics Vancomycin 2000 mg loading dose given 2/13 @2244 . ~48 hour random level was 16 and 12 after ~ 56 hours. Will give 1000 mg dose x 1. Recheck vancomycin random ~24 hour post dose.    Height: 5\' 8"  (172.7 cm) Weight: 117.4 kg (258 lb 13.1 oz) IBW/kg (Calculated) : 63.9  Temp (24hrs), Avg:98.3 F (36.8 C), Min:94.1 F (34.5 C), Max:99.5 F (37.5 C)  Recent Labs  Lab 04/24/23 1240 04/24/23 1738 04/25/23 0411 04/25/23 1443 04/25/23 1848 04/26/23 0357 04/26/23 0357 04/26/23 1824 04/26/23 2114 04/27/23 0424 04/27/23 0552  WBC 33.4*  --  43.9*  --   --  46.7*  --   --   --  41.5*  --   CREATININE 1.27*  --  2.38* 2.82* 3.05* 4.02*  --  4.73*  --  3.86*  --   LATICACIDVEN 1.7 1.2  --   --   --   --   --   --   --   --   --   VANCORANDOM  --   --   --   --   --  18   < >  --  16  --  12   < > = values in this interval not displayed.    Estimated Creatinine Clearance: 18.5 mL/min (A) (by C-G formula based on SCr of 3.86 mg/dL (H)).    No Known Allergies  Antimicrobials this admission: 2/13 ceftriaxone x 1 azithromycin 2/13 >>  vancomycin 2/13 >>  cefepime 2/14 >>   Dose adjustments this admission:  Ceftriaxone x 1  Microbiology results: 2/13 MRSA PCR: negative 2/13 BCx: NGTD   Thank you for allowing pharmacy to be a part of this patient's care.  Ronnald Ramp, PharmD Pharmacy Resident  04/27/2023 7:26 AM

## 2023-04-27 NOTE — Consult Note (Signed)
 PHARMACY CONSULT NOTE - ELECTROLYTES  Pharmacy Consult for Electrolyte Monitoring and Replacement   Recent Labs:  Height: 5\' 8"  (172.7 cm) Weight: 117.4 kg (258 lb 13.1 oz) IBW/kg (Calculated) : 63.9 Estimated Creatinine Clearance: 18.5 mL/min (A) (by C-G formula based on SCr of 3.86 mg/dL (H)). Potassium (mmol/L)  Date Value  04/27/2023 4.2  11/16/2013 3.5   Magnesium (mg/dL)  Date Value  16/12/9602 2.2  11/16/2013 1.0 (L)   Calcium (mg/dL)  Date Value  54/11/8117 7.8 (L)   Calcium, Total (mg/dL)  Date Value  14/78/2956 9.1   Albumin (g/dL)  Date Value  21/30/8657 2.2 (L)  11/16/2013 3.8   Phosphorus (mg/dL)  Date Value  84/69/6295 3.0   Sodium (mmol/L)  Date Value  04/27/2023 134 (L)  11/16/2013 133 (L)    Assessment  Sarah Norton is a 68 y.o. female presenting with respiratory failure and unresponsive. PMH significant for DM and COPD. Due to high BG started an insulin gtt to target POC 140 - 180. Patient Pharmacy has been consulted to monitor and replace electrolytes. Now on CRRT.   Diet: npo Pertinent medications: free water 30 ml q4H  Goal of Therapy: Electrolytes WNL   Plan:  No replacement needed  F/u with renal function lab @0500  and 1600 daily.   Thank you for allowing pharmacy to be a part of this patient's care.  Ronnald Ramp, PharmD 04/27/2023 7:17 AM

## 2023-04-27 NOTE — Progress Notes (Signed)
 NAME:  Sarah Norton, MRN:  [id number], DOB:  [birth year], LOS: 3 ADMISSION DATE:  04/24/2023  History of Present Illness:  Sarah Norton is a 68 year old female with a past medical history significant for COPD on home supplemental oxygen and diabetes mellitus who presented to [facility] ED on 04/24/2023 due to unresponsiveness.  Upon EMS arrival she was noted to be hypoxic with O2 saturations in the 40s for which she was placed on CPAP with improvement in sats to the 80s.  Upon arrival to the ED she remained somnolent but slightly arousable, and given her hypoxia, she was transitioned to BiPAP.  Pt is currently intubated and sedated, and no other history is currently available.    ED Course: Initial Vital Signs: Temperature 96.5 F axillary, pulse 87, respiratory rate 33, blood pressure 136/97, SpO2 78% Significant Labs: Sodium 134, glucose 313, BUN 59, creatinine 1.27, BNP 436, high-sensitivity troponin 139, lactic acid 1.7, WBC 33.4, D-dimer 2.13 VBG: pH 7.19/pCO2 82/pO2 39/bicarb 31.3 Positive for influenza A Urinalysis negative for UTI Imaging Chest X-ray>>IMPRESSION: *Bibasilar heterogeneous opacities, favored to represent multilobar pneumonia. Follow-up to clearing is recommended. CTa Chest>> pending currently Medications Administered: 1 L normal saline bolus, 125 mg Solu-Medrol, IV azithromycin and ceftriaxone, Levophed drip initiated   Despite BiPAP patient's ABG worsened with worsening mental status.  Therefore ED provider elected to intubate.  PCCM is asked to admit for further workup and treatment.   Please see "significant hospital events" section below for full detailed hospital course.  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   Significant Hospital Events: Including procedures, antibiotic start and stop dates in addition to other pertinent events   04/24/2023 - Admitted to the ICU intubated and MV.  04/25/2023 - Patient  with improving respiratory status however with worsening wbc and procalcitonin.  04/26/2023 - HD cath placed and started on CRRT.   Interim History / Subjective:  ON: Respiratory rate increased to 24 and TV 540.  Respiratory status improved, shock improved.   Objective   Blood pressure (!) 110/47, pulse 71, temperature 98.6 F (37 C), resp. rate (!) 22, height 5\' 8"  (1.727 m), weight 117.4 kg, SpO2 98%. CVP:  [8 mmHg-37 mmHg] 11 mmHg  Vent Mode: PRVC FiO2 (%):  [50 %] 50 % Set Rate:  [24 bmp] 24 bmp Vt Set:  [500 mL-540 mL] 540 mL PEEP:  [12 cmH20] 12 cmH20   Intake/Output Summary (Last 24 hours) at 04/27/2023 0950 Last data filed at 04/27/2023 0900 Gross per 24 hour  Intake 3831.51 ml  Output 1677 ml  Net 2154.51 ml   Filed Weights   04/25/23 0500 04/26/23 0457 04/27/23 0437  Weight: 109.6 kg 115.5 kg 117.4 kg    Examination: General: Intubated, Sedated HENT: Supple neck, reactive pupils Lungs: Diffuse ronchi w/ Expiratory wheezing.  Cardiovascular: Normal S1, Normal S2, RRR  Abdomen: Soft, non tender, non distended, +BS  Extremities: Warm and well perfused   Labs and imaging were reviwed.   Assessment & Plan:  Case of 68 year old female patient with past medical history of COPD with chronic hypoxic respiratory failure on 2 L nasal cannula presenting to [facility] with acute on chronic hypoxic hypercapnic respiratory failure requiring intubation and mechanical ventilation on 02/13 secondary to ARDS, influenza type A with possible superimposed bacterial pneumonia.  # Acute on chronic hypoxic hypercapnic respiratory failure requiring intubation and mechanical ventilation on 04/24/2023 secondary to # ARDS in the setting of influenza type  A and possible superimposed bacterial pneumonia. # COPD exacerbation # Septic shock secondary to the above requiring pressor support # Paroxysmal A-fib on Eliquis # Demand ischemia troponin at 139 # AKI with creatinine 4.0 mg/dL from a baseline  of 5.40 mg/dL likely secondary to septic ATN. Started CRRT on 04/26/2023.   Neuro: Propofol and fentanyl for RASS 0 to -1. CPOTless than 2.  CVS: DC vaso and can continue w/ NE for MAP greater than 65.  Keep holding home Eliquis for now. Lungs/ID: DuoNebs every 4 hours scheduled.  Hydrocortisone 50mg  Q6H.  MRSA swab negative.  Sputum culture gram + cocci.  Continue cefepime azithromycin and vancomycin.  Lung protective ventilation currently at 8 cc/kg.  SpO2 goal greater than 90%.  Permissive hypercapnia with pH greater than 7.2. GI PPI for GI prophylaxis.  Start tube feeds can advance as tolerated.  Laxatives as needed. Endo Glargine 10units BID and ISS for POC's 140-180 Renal Avoid nephrotoxic agents. Monitor UOP. Appreciate renal input. On CRRT.  Heme On heparin drip.   Best Practice (right click and "Reselect all SmartList Selections" daily)   Diet/type: tubefeeds DVT prophylaxis prophylactic heparin  Pressure ulcer(s): N/A GI prophylaxis: PPI Lines: Central line Foley:  Yes, and it is still needed Code Status:  full code  Last date of multidisciplinary goals of care discussion [04/25/2023]  Critical Care Time: 40 min   Janann Colonel, MD [facility] 04/27/2023 9:50 AM

## 2023-04-27 NOTE — Consult Note (Signed)
 PHARMACY - ANTICOAGULATION CONSULT NOTE  Pharmacy Consult for Heparin Infusion Indication: atrial fibrillation  No Known Allergies  Patient Measurements: Height: 5\' 8"  (172.7 cm) Weight: 117.4 kg (258 lb 13.1 oz) IBW/kg (Calculated) : 63.9 Heparin Dosing Weight: 88.8 kg  Vital Signs: Temp: 98.4 F (36.9 C) (02/16 0645) Temp Source: Bladder (02/16 0400) BP: 122/58 (02/16 0600) Pulse Rate: 72 (02/16 0645)  Labs: Recent Labs    04/24/23 1240 04/24/23 1738 04/25/23 0411 04/25/23 1443 04/26/23 0357 04/26/23 1155 04/26/23 1824 04/26/23 1947 04/27/23 0424 04/27/23 0552  HGB 12.7  --  10.6*  --  9.8*  --   --   --  9.5*  --   HCT 38.1  --  32.1*  --  29.1*  --   --   --  28.2*  --   PLT 309  --  342  --  332  --   --   --  325  --   APTT  --   --   --   --   --   --   --  45*  --  72*  LABPROT 14.3  --   --   --   --   --   --   --   --   --   INR 1.1  --   --   --   --   --   --   --   --   --   HEPARINUNFRC  --   --   --   --   --  0.67  --   --   --  0.81*  CREATININE 1.27*  --  2.38*   < > 4.02*  --  4.73*  --  3.86*  --   TROPONINIHS 139* 113*  --   --   --   --   --   --   --   --    < > = values in this interval not displayed.    Estimated Creatinine Clearance: 18.5 mL/min (A) (by C-G formula based on SCr of 3.86 mg/dL (H)).   Medical History: Past Medical History:  Diagnosis Date   Arthritis    Asthma    Cataract of both eyes    Complication of anesthesia    woke up during surgery x1   COPD (chronic obstructive pulmonary disease) (HCC)    Coronary artery disease    DDD (degenerative disc disease), lumbar    Diabetes mellitus without complication (HCC)    Dyspnea    DUE TO COPD   Hernia of abdominal wall    History of hiatal hernia    small   Hyperlipidemia    Hypertension    Hypothyroidism    hashimotos throiditis  -thyroidectomy  2000   Occasional tremors    Oxygen desaturation    NOW PT ON 3-4 LITERS [place] DAILY    Medications:  Medications  Prior to Admission  Medication Sig Dispense Refill Last Dose/Taking   albuterol (VENTOLIN HFA) 108 (90 Base) MCG/ACT inhaler Inhale 2 puffs into the lungs every 4 (four) hours as needed for wheezing or shortness of breath.   Taking As Needed   allopurinol (ZYLOPRIM) 100 MG tablet Take 100 mg by mouth every morning.   Past Week   amLODipine (NORVASC) 5 MG tablet Take 5 mg by mouth every morning.   Past Week   atorvastatin (LIPITOR) 40 MG tablet Take 40 mg by mouth every morning.   Past Week  benazepril (LOTENSIN) 40 MG tablet Take 40 mg by mouth every morning.   Past Week   ELIQUIS 5 MG TABS tablet Take 5 mg by mouth 2 (two) times daily.   Past Week   fluticasone (FLONASE) 50 MCG/ACT nasal spray Place 2 sprays into both nostrils daily as needed for allergies.   Past Week   fluticasone-salmeterol (ADVAIR HFA) 115-21 MCG/ACT inhaler Inhale 2 puffs by mouth twice daily 12 g 0 Past Week   furosemide (LASIX) 80 MG tablet Take 80 mg by mouth every morning.   Past Week   gabapentin (NEURONTIN) 600 MG tablet Take 600 mg by mouth 3 (three) times daily.   Past Week   hydrochlorothiazide (HYDRODIURIL) 25 MG tablet Take 25 mg by mouth every morning.   Past Week   indomethacin (INDOCIN) 25 MG capsule Take 25 mg by mouth 3 (three) times daily with meals.   Past Week   levocetirizine (XYZAL) 5 MG tablet Take 5 mg by mouth every evening.   Past Week   levothyroxine (SYNTHROID) 175 MCG tablet Take 175 mcg by mouth daily before breakfast.   Past Week   methocarbamol (ROBAXIN) 500 MG tablet Take 1-2 tablets by mouth every 6 (six) hours as needed for muscle spasms.   Past Week   Metoprolol Tartrate 75 MG TABS Take 1 tablet by mouth 2 (two) times daily.   Past Week   montelukast (SINGULAIR) 10 MG tablet Take 10 mg by mouth every morning.   Past Week   MOUNJARO 10 MG/0.5ML Pen Inject 10 mg into the skin once a week.   Past Week   [facility] powder Apply 1 Application topically 2 (two) times daily.   Past Week    promethazine (PHENERGAN) 25 MG tablet Take 25 mg by mouth every 6 (six) hours as needed for nausea or vomiting.   Taking As Needed   Tiotropium Bromide Monohydrate (SPIRIVA RESPIMAT) 1.25 MCG/ACT AERS Inhale 2 puffs into the lungs daily for 30 doses. 3 each 0 Past Week   tiZANidine (ZANAFLEX) 4 MG tablet Take 4 mg by mouth 3 (three) times daily.   Past Week   tretinoin (RETIN-A) 0.05 % cream Apply to face qhs, wash off qam 45 g 11 Past Week   OXYGEN Inhale 3-4 L into the lungs daily.       Assessment: 68 year old female with a past medical history significant for COPD on home supplemental oxygen and diabetes mellitus who presented to [facility] ED on 04/24/2023 due to unresponsiveness. PMH of afib on apixaban. Hgb is trending down. Last dose heparin sq 2/15 @0522 . CHADSVASc 3. Last dose apixaban unknown. Baseline heparin level was 0.67, possibly has some false elevated due to apixaban.     Date Time aPTT/HL Rate/Comment  2/15 1947 45/---  1200/subtherapeutic 2/16 0552 72/0.81 1500/aPTT therapeutic    Goal of Therapy:  Heparin level 0.3-0.7 units/ml aPTT 66-102 seconds Monitor platelets by anticoagulation protocol: Yes  Plan:  aPTT is therapeutic, but heparin level is supratherapeutic. HL is trending up from baseline, so hard to interpret if there is still false elevation of the heparin level. Will continue with aPTT monitoring. Recheck aPTT in 8 hours. Heparin level and CBC with AM labs.   Ronnald Ramp, PharmD Clinical Pharmacist 04/27/2023 7:07 AM

## 2023-04-27 NOTE — Progress Notes (Signed)
 [facility] [place], [place] 04/27/23  Subjective:   Hospital day # 3 Patient remains critically ill intubated and sedated. cvs: Requiring pressors, norepinephrine. pulm: Ventilator assisted.  FiO2 50%/PEEP 10 gi: Tube feeds at 30 cc/h via OG tube.  Rectal tube in place Renal: Started on CRRT last night for severe acidosis. 02/15 0701 - 02/16 0700 In: 3614.5 [I.V.:1878.9; NG/GT:703.2; IV Piggyback:976.5] Out: 1456 [Urine:60; Stool:230] Lab Results  Component Value Date   CREATININE 3.86 (H) 04/27/2023   CREATININE 4.73 (H) 04/26/2023   CREATININE 4.02 (H) 04/26/2023     Objective:  Vital signs in last 24 hours:  Temp:  [97.7 F (36.5 C)-99 F (37.2 C)] 99 F (37.2 C) (02/16 1230) Pulse Rate:  [64-139] 93 (02/16 1230) Resp:  [16-24] 22 (02/16 1230) BP: (101-147)/(42-110) 127/53 (02/16 1200) SpO2:  [93 %-99 %] 94 % (02/16 1230) Arterial Line BP: (100-180)/(39-76) 118/43 (02/16 1230) FiO2 (%):  [50 %] 50 % (02/16 1200) Weight:  [117.4 kg] 117.4 kg (02/16 0437)  Weight change: 1.9 kg Filed Weights   04/25/23 0500 04/26/23 0457 04/27/23 0437  Weight: 109.6 kg 115.5 kg 117.4 kg    Intake/Output:    Intake/Output Summary (Last 24 hours) at 04/27/2023 1256 Last data filed at 04/27/2023 1200 Gross per 24 hour  Intake 3596.54 ml  Output 2301 ml  Net 1295.54 ml     Physical Exam: General Appearance-critically ill-appearing female, laying in the bed HEENT-ET tube in place, OG tube in place Pulmonary-ventilator assisted.  Clear to auscultation bilaterally Cardiac-regular rhythm noted on telemetry Abdomen-nondistended Extremities-SCDs in place, trace edema Skin-no acute rashes noted Neuro-sedated Right IJ temporary dialysis catheter Foley in place Rectal tube in place  Basic Metabolic Panel:  Recent Labs  Lab 04/25/23 0411 04/25/23 1443 04/25/23 1848 04/26/23 0357 04/26/23 0731 04/26/23 1824 04/27/23 0424  NA 133* 136 135 133*  --   132* 134*  K 4.2 3.5 3.7 4.1  --  4.0 4.2  CL 97* 98 96* 94*  --  99 99  CO2 25 24 23 22   --  19* 21*  GLUCOSE 474* 171* 129* 226*  --  280* 168*  BUN 64* 69* 70* 79*  --  86* 72*  CREATININE 2.38* 2.82* 3.05* 4.02*  --  4.73* 3.86*  CALCIUM 8.2* 8.0* 8.1* 8.3*  --  7.7* 7.8*  MG 2.0 1.9  --   --  2.0  --  2.2  PHOS 4.2 2.6  --  2.9  --   --  3.0     CBC: Recent Labs  Lab 04/24/23 1240 04/25/23 0411 04/26/23 0357 04/27/23 0424  WBC 33.4* 43.9* 46.7* 41.5*  NEUTROABS 28.0*  --   --   --   HGB 12.7 10.6* 9.8* 9.5*  HCT 38.1 32.1* 29.1* 28.2*  MCV 91.4 93.3 92.1 89.5  PLT 309 342 332 325     No results found for: "HEPBSAG", "HEPBSAB", "HEPBIGM"    Microbiology:  Recent Results (from the past 240 hours)  Culture, blood (Routine x 2)     Status: None (Preliminary result)   Collection Time: 04/24/23 12:40 PM   Specimen: BLOOD  Result Value Ref Range Status   Specimen Description BLOOD RIGHT ARM  Final   Special Requests   Final    BOTTLES DRAWN AEROBIC AND ANAEROBIC Blood Culture adequate volume   Culture  Setup Time PENDING  Incomplete   Culture   Final    NO GROWTH 3 DAYS Performed at [facility]  Lab, [address] [place], [place] [address]    Report Status PENDING  Incomplete  Culture, blood (Routine x 2)     Status: None (Preliminary result)   Collection Time: 04/24/23 12:42 PM   Specimen: BLOOD  Result Value Ref Range Status   Specimen Description BLOOD LEFT ANTECUBITAL  Final   Special Requests   Final    BOTTLES DRAWN AEROBIC AND ANAEROBIC Blood Culture adequate volume   Culture  Setup Time NO ORGANISMS SEEN  Final   Culture   Final    NO GROWTH 3 DAYS Performed at [facility], [address], [place], [place] [address]    Report Status PENDING  Incomplete  Resp panel by RT-PCR (RSV, Flu A&B, Covid) Anterior Nasal Swab     Status: Abnormal   Collection Time: 04/24/23 12:42 PM   Specimen: Anterior Nasal Swab  Result Value Ref Range  Status   SARS Coronavirus 2 by RT PCR NEGATIVE NEGATIVE Final    Comment: (NOTE) SARS-CoV-2 target nucleic acids are NOT DETECTED.  The SARS-CoV-2 RNA is generally detectable in upper respiratory specimens during the acute phase of infection. The lowest concentration of SARS-CoV-2 viral copies this assay can detect is 138 copies/mL. A negative result does not preclude SARS-Cov-2 infection and should not be used as the sole basis for treatment or other patient management decisions. A negative result may occur with  improper specimen collection/handling, submission of specimen other than nasopharyngeal swab, presence of viral mutation(s) within the areas targeted by this assay, and inadequate number of viral copies(<138 copies/mL). A negative result must be combined with clinical observations, patient history, and epidemiological information. The expected result is Negative.  Fact Sheet for Patients:  BloggerCourse.com  Fact Sheet for Healthcare Providers:  SeriousBroker.it  This test is no t yet approved or cleared by the [place] FDA and  has been authorized for detection and/or diagnosis of SARS-CoV-2 by FDA under an Emergency Use Authorization (EUA). This EUA will remain  in effect (meaning this test can be used) for the duration of the COVID-19 declaration under Section 564(b)(1) of the Act, 21 U.S.C.section 360bbb-3(b)(1), unless the authorization is terminated  or revoked sooner.       Influenza A by PCR POSITIVE (A) NEGATIVE Final   Influenza B by PCR NEGATIVE NEGATIVE Final    Comment: (NOTE) The Xpert Xpress SARS-CoV-2/FLU/RSV plus assay is intended as an aid in the diagnosis of influenza from Nasopharyngeal swab specimens and should not be used as a sole basis for treatment. Nasal washings and aspirates are unacceptable for Xpert Xpress SARS-CoV-2/FLU/RSV testing.  Fact Sheet for  Patients: BloggerCourse.com  Fact Sheet for Healthcare Providers: SeriousBroker.it  This test is not yet approved or cleared by the [place] FDA and has been authorized for detection and/or diagnosis of SARS-CoV-2 by FDA under an Emergency Use Authorization (EUA). This EUA will remain in effect (meaning this test can be used) for the duration of the COVID-19 declaration under Section 564(b)(1) of the Act, 21 U.S.C. section 360bbb-3(b)(1), unless the authorization is terminated or revoked.     Resp Syncytial Virus by PCR NEGATIVE NEGATIVE Final    Comment: (NOTE) Fact Sheet for Patients: BloggerCourse.com  Fact Sheet for Healthcare Providers: SeriousBroker.it  This test is not yet approved or cleared by the [place] FDA and has been authorized for detection and/or diagnosis of SARS-CoV-2 by FDA under an Emergency Use Authorization (EUA). This EUA will remain in effect (meaning this test can be  used) for the duration of the COVID-19 declaration under Section 564(b)(1) of the Act, 21 U.S.C. section 360bbb-3(b)(1), unless the authorization is terminated or revoked.  Performed at [facility], [address] [place], [place] [address]   MRSA Next Gen by PCR, Nasal     Status: None   Collection Time: 04/24/23  9:52 PM   Specimen: Nasal Mucosa; Nasal Swab  Result Value Ref Range Status   MRSA by PCR Next Gen NOT DETECTED NOT DETECTED Final    Comment: (NOTE) The GeneXpert MRSA Assay (FDA approved for NASAL specimens only), is one component of a comprehensive MRSA colonization surveillance program. It is not intended to diagnose MRSA infection nor to guide or monitor treatment for MRSA infections. Test performance is not FDA approved in patients less than 32 years old. Performed at [facility], [address] [place], [place] [address]    Respiratory (~20 pathogens) panel by PCR     Status: Abnormal   Collection Time: 04/25/23  1:27 PM   Specimen: Nasopharyngeal Swab; Respiratory  Result Value Ref Range Status   Adenovirus NOT DETECTED NOT DETECTED Final   Coronavirus 229E NOT DETECTED NOT DETECTED Final    Comment: (NOTE) The Coronavirus on the Respiratory Panel, DOES NOT test for the novel  Coronavirus (2019 nCoV)    Coronavirus HKU1 NOT DETECTED NOT DETECTED Final   Coronavirus NL63 NOT DETECTED NOT DETECTED Final   Coronavirus OC43 NOT DETECTED NOT DETECTED Final   Metapneumovirus NOT DETECTED NOT DETECTED Final   Rhinovirus / Enterovirus NOT DETECTED NOT DETECTED Final   Influenza A H3 DETECTED (A) NOT DETECTED Final   Influenza B NOT DETECTED NOT DETECTED Final   Parainfluenza Virus 1 NOT DETECTED NOT DETECTED Final   Parainfluenza Virus 2 NOT DETECTED NOT DETECTED Final   Parainfluenza Virus 3 NOT DETECTED NOT DETECTED Final   Parainfluenza Virus 4 NOT DETECTED NOT DETECTED Final   Respiratory Syncytial Virus NOT DETECTED NOT DETECTED Final   Bordetella pertussis NOT DETECTED NOT DETECTED Final   Bordetella Parapertussis NOT DETECTED NOT DETECTED Final   Chlamydophila pneumoniae NOT DETECTED NOT DETECTED Final   Mycoplasma pneumoniae NOT DETECTED NOT DETECTED Final    Comment: Performed at [facility], [address], [place], [place] [address]  Culture, Respiratory w Gram Stain     Status: None (Preliminary result)   Collection Time: 04/26/23  3:45 PM   Specimen: Tracheal Aspirate  Result Value Ref Range Status   Specimen Description   Final    TRACHEAL ASPIRATE Performed at [facility], [address] [place], [place] [address]    Special Requests   Final    NONE Performed at [facility], [address] [place], [place] [address]    Gram Stain   Final    RARE SQUAMOUS EPITHELIAL CELLS PRESENT WBC PRESENT,BOTH PMN AND MONONUCLEAR RARE GRAM POSITIVE COCCI    Culture    Final    NO GROWTH < 24 HOURS Performed at [facility], [address] [place], [place] [address]    Report Status PENDING  Incomplete    Coagulation Studies: No results for input(s): "LABPROT", "INR" in the last 72 hours.  Urinalysis: No results for input(s): "COLORURINE", "LABSPEC", "PHURINE", "GLUCOSEU", "HGBUR", "BILIRUBINUR", "KETONESUR", "PROTEINUR", "UROBILINOGEN", "NITRITE", "LEUKOCYTESUR" in the last 72 hours.  Invalid input(s): "APPERANCEUR"    Imaging: DG Chest Port 1 View Result Date: 04/26/2023 CLINICAL DATA:  Check central line placed EXAM: PORTABLE CHEST  1 VIEW COMPARISON:  Film from earlier in the same day. FINDINGS: Endotracheal tube is noted in satisfactory position. Gastric catheter extends into the stomach although the proximal side port lies in the distal esophagus. This should be advanced deeper into the stomach. Left jugular central line is again seen. New right jugular central line is noted with the catheter tip in the proximal superior vena cava. No pneumothorax is noted. Stable parenchymal opacities are noted right greater than left. IMPRESSION: No pneumothorax following central line placement. Tubes and lines as described. Stable right-sided airspace opacity. Electronically Signed   By: Alcide Clever M.D.   On: 04/26/2023 19:35   US RENAL Result Date: 04/26/2023 CLINICAL DATA:  [id number] Acute kidney failure (HCC) [id number]. EXAM: RENAL / URINARY TRACT ULTRASOUND COMPLETE COMPARISON:  None Available. FINDINGS: Right Kidney: Renal measurements: 5.2 x 6.1 x 12.0 cm = volume: 199 mL. There is diffuse increased cortical echogenicity, nonspecific but commonly seen with medical renal disease. There are several simple cysts with largest measuring up to 1.7 x 1.7 x 1.8 cm. No contour deforming mass, hydronephrosis or stone large enough to cause acoustic shadowing. Left Kidney: Renal measurements: 4.9 x 6.1 x 12.2 cm = volume: 189 mL. There is diffuse increased cortical  echogenicity, nonspecific but most commonly seen with medical renal disease. There is a single partially exophytic cyst in the lower pole measuring up to 1.1 x 1.3 x 1.4 cm. No contour deforming mass, hydronephrosis or stone large enough to cause acoustic shadowing. Bladder: Decompressed around a Foley catheter. Other: Incidental note is made of trace amount of ascites in the perihepatic and left perirenal space. IMPRESSION: 1. Bilateral kidneys exhibit diffuse increased cortical echogenicity, nonspecific but most commonly seen with medical renal disease. 2. There are bilateral simple renal cysts, as described above. 3. There is trace amount of ascites in the perihepatic and left perirenal space. Electronically Signed   By: Jules Schick M.D.   On: 04/26/2023 13:19   DG Chest Port 1 View Result Date: 04/26/2023 CLINICAL DATA:  [id number] Acute hypoxic respiratory failure [facility]) [id number] EXAM: PORTABLE CHEST 1 VIEW COMPARISON:  April 24, 2023 FINDINGS: The cardiomediastinal silhouette is unchanged in contour.ETT tip terminates 2.7 cm above the carina. The enteric tube courses through the chest to the abdomen beyond the field-of-view. LEFT neck CVC tip terminates over the superior cavoatrial junction. Favored trace RIGHT pleural effusion. No pneumothorax. Improved aeration of the RIGHT lung base with persistent bibasilar heterogeneous opacities. IMPRESSION: 1. Support apparatus as described above. 2. Improved aeration of the RIGHT lung base with persistent bibasilar heterogeneous opacities. Electronically Signed   By: Meda Klinefelter M.D.   On: 04/26/2023 12:04     Medications:    azithromycin 250 mL/hr at 04/27/23 1200   ceFEPime (MAXIPIME) IV Stopped (04/27/23 1017)   feeding supplement (VITAL 1.5 CAL) 30 mL/hr at 04/27/23 1200   fentaNYL infusion INTRAVENOUS 175 mcg/hr (04/27/23 1200)   heparin 1,500 Units/hr (04/27/23 1200)   norepinephrine (LEVOPHED) Adult infusion 12 mcg/min (04/27/23 1200)    prismasol BGK 4/2.5 400 mL/hr at 04/27/23 1107   prismasol BGK 4/2.5 400 mL/hr at 04/27/23 1107   prismasol BGK 4/2.5 1,500 mL/hr at 04/27/23 1107   propofol (DIPRIVAN) infusion 30 mcg/kg/min (04/27/23 1200)   vasopressin Stopped (04/27/23 1010)    Chlorhexidine Gluconate Cloth  6 each Topical Daily   docusate  100 mg Per Tube BID   free water  30 mL Per Tube Q4H   hydrocortisone sod succinate (SOLU-CORTEF)  inj  50 mg Intravenous Q6H   insulin aspart  0-20 Units Subcutaneous Q4H   insulin glargine-yfgn  10 Units Subcutaneous BID   ipratropium-albuterol  3 mL Nebulization Q4H   levothyroxine  175 mcg Per Tube Q0600   multivitamin with minerals  1 tablet Per Tube Daily   mouth rinse  15 mL Mouth Rinse Q2H   oseltamivir  75 mg Per Tube Daily   pantoprazole (PROTONIX) IV  40 mg Intravenous QHS   polyethylene glycol  17 g Per Tube Daily   sodium chloride flush  10-40 mL Intracatheter Q12H   thiamine  100 mg Per Tube Daily   vancomycin variable dose per unstable renal function (pharmacist dosing)   Does not apply See admin instructions   acetaminophen, docusate sodium, fentaNYL, fentaNYL (SUBLIMAZE) injection, fentaNYL (SUBLIMAZE) injection, heparin, mouth rinse, polyethylene glycol, sodium chloride flush  Assessment/ Plan:  68 y.o. female with  medical problems of  COPD, home oxygen, diabetes mellitus, coronary disease, hypertension  admitted on 04/24/2023 for COPD exacerbation (HCC) [J44.1] Acute respiratory failure with hypoxia and hypercarbia (HCC) [J96.01, J96.02] Acute respiratory failure with hypoxia and hypercapnia (HCC) [J96.01, J96.02] Community acquired pneumonia of right lower lobe of lung [J18.9]  Acute kidney injury on chronic kidney disease stage IIIa.  Baseline creatinine 1 7/GFR 46 on 04/24/2023 (admission) IV contrast nephropathy Acute kidney injury likely secondary to ATN from concurrent illness/sepsis and IV contrast exposure on 04/24/2023. Urinalysis-shows proteinuria  of 100 mg, 0-5 WBCs, 0-5 RBCs. Patient remains oliguric.  Last night she was started on CRRT due to severe acidosis which was not amenable to medical intervention. CRRT: 4K/2.5 Ca infusion.  Prefilter 400 mL/h, post filter 400 mL/h, dialysate 1500 mL/h UF goal 25 mL/h   Pneumonia, sepsis, hypotension Influenza A positive Currently requiring ventilator support and pressors. Antibiotic regimen includes azithromycin, cefepime, Tamiflu, vancomycin. Management as per ICU team.     LOS: 3 Neila Teem Thedore Mins 2/16/202512:56 PM  [facility] [place], [place] [phone]  Note: This note was prepared with Dragon dictation. Any transcription errors are unintentional

## 2023-04-28 DIAGNOSIS — I4891 Unspecified atrial fibrillation: Secondary | ICD-10-CM

## 2023-04-28 DIAGNOSIS — J9601 Acute respiratory failure with hypoxia: Secondary | ICD-10-CM | POA: Diagnosis not present

## 2023-04-28 DIAGNOSIS — R6521 Severe sepsis with septic shock: Secondary | ICD-10-CM | POA: Diagnosis not present

## 2023-04-28 DIAGNOSIS — J9602 Acute respiratory failure with hypercapnia: Secondary | ICD-10-CM | POA: Diagnosis not present

## 2023-04-28 DIAGNOSIS — A419 Sepsis, unspecified organism: Secondary | ICD-10-CM | POA: Diagnosis not present

## 2023-04-28 LAB — GLUCOSE, CAPILLARY
Glucose-Capillary: 243 mg/dL — ABNORMAL HIGH (ref 70–99)
Glucose-Capillary: 204 mg/dL — ABNORMAL HIGH (ref 70–99)
Glucose-Capillary: 195 mg/dL — ABNORMAL HIGH (ref 70–99)
Glucose-Capillary: 204 mg/dL — ABNORMAL HIGH (ref 70–99)
Glucose-Capillary: 180 mg/dL — ABNORMAL HIGH (ref 70–99)
Glucose-Capillary: 182 mg/dL — ABNORMAL HIGH (ref 70–99)

## 2023-04-28 LAB — CBC
HCT: 30.3 % — ABNORMAL LOW (ref 36.0–46.0)
Hemoglobin: 10 g/dL — ABNORMAL LOW (ref 12.0–15.0)
MCH: 30.1 pg (ref 26.0–34.0)
MCHC: 33 g/dL (ref 30.0–36.0)
MCV: 91.3 fL (ref 80.0–100.0)
Platelets: 320 10*3/uL (ref 150–400)
RBC: 3.32 MIL/uL — ABNORMAL LOW (ref 3.87–5.11)
RDW: 16.8 % — ABNORMAL HIGH (ref 11.5–15.5)
WBC: 31.3 10*3/uL — ABNORMAL HIGH (ref 4.0–10.5)
nRBC: 0.4 % — ABNORMAL HIGH (ref 0.0–0.2)

## 2023-04-28 LAB — RENAL FUNCTION PANEL
Albumin: 2.2 g/dL — ABNORMAL LOW (ref 3.5–5.0)
Albumin: 2.1 g/dL — ABNORMAL LOW (ref 3.5–5.0)
Anion gap: 10 (ref 5–15)
Anion gap: 6 (ref 5–15)
BUN: 48 mg/dL — ABNORMAL HIGH (ref 8–23)
BUN: 44 mg/dL — ABNORMAL HIGH (ref 8–23)
CO2: 23 mmol/L (ref 22–32)
CO2: 25 mmol/L (ref 22–32)
Calcium: 7.8 mg/dL — ABNORMAL LOW (ref 8.9–10.3)
Calcium: 7.7 mg/dL — ABNORMAL LOW (ref 8.9–10.3)
Chloride: 101 mmol/L (ref 98–111)
Chloride: 102 mmol/L (ref 98–111)
Creatinine, Ser: 2.43 mg/dL — ABNORMAL HIGH (ref 0.44–1.00)
Creatinine, Ser: 1.73 mg/dL — ABNORMAL HIGH (ref 0.44–1.00)
GFR, Estimated: 21 mL/min — ABNORMAL LOW (ref >60)
GFR, Estimated: 32 mL/min — ABNORMAL LOW (ref >60)
Glucose, Bld: 249 mg/dL — ABNORMAL HIGH (ref 70–99)
Glucose, Bld: 235 mg/dL — ABNORMAL HIGH (ref 70–99)
Phosphorus: 2.9 mg/dL (ref 2.5–4.6)
Phosphorus: 2.7 mg/dL (ref 2.5–4.6)
Potassium: 4.8 mmol/L (ref 3.5–5.1)
Potassium: 5 mmol/L (ref 3.5–5.1)
Sodium: 134 mmol/L — ABNORMAL LOW (ref 135–145)
Sodium: 133 mmol/L — ABNORMAL LOW (ref 135–145)

## 2023-04-28 LAB — MAGNESIUM: Magnesium: 2.4 mg/dL (ref 1.7–2.4)

## 2023-04-28 LAB — TRIGLYCERIDES: Triglycerides: 162 mg/dL — ABNORMAL HIGH (ref <150)

## 2023-04-28 LAB — HEPARIN LEVEL (UNFRACTIONATED)
Heparin Unfractionated: 0.65 [IU]/mL (ref 0.30–0.70)
Heparin Unfractionated: 0.61 [IU]/mL (ref 0.30–0.70)

## 2023-04-28 LAB — TROPONIN I (HIGH SENSITIVITY): Troponin I (High Sensitivity): 49 ng/L — ABNORMAL HIGH (ref <18)

## 2023-04-28 LAB — VANCOMYCIN, RANDOM: Vancomycin Rm: 11 ug/mL

## 2023-04-28 LAB — APTT
aPTT: 69 s — ABNORMAL HIGH (ref 24–36)
aPTT: 74 s — ABNORMAL HIGH (ref 24–36)

## 2023-04-28 MED ORDER — INSULIN GLARGINE-YFGN 100 UNIT/ML ~~LOC~~ SOLN
5.0000 [IU] | Freq: Once | SUBCUTANEOUS | Status: AC
  Administered 2023-04-28: 5 [IU] via SUBCUTANEOUS
  Filled 2023-04-28: qty 0.05

## 2023-04-28 MED ORDER — INSULIN GLARGINE-YFGN 100 UNIT/ML ~~LOC~~ SOLN
15.0000 [IU] | Freq: Two times a day (BID) | SUBCUTANEOUS | Status: DC
  Administered 2023-04-28 – 2023-05-03 (×11): 15 [IU] via SUBCUTANEOUS
  Filled 2023-04-28 (×13): qty 0.15

## 2023-04-28 MED ORDER — OSELTAMIVIR PHOSPHATE 6 MG/ML PO SUSR
30.0000 mg | Freq: Every day | ORAL | Status: DC
  Filled 2023-04-28: qty 12.5

## 2023-04-28 MED ORDER — VANCOMYCIN HCL 1250 MG/250ML IV SOLN
1250.0000 mg | Freq: Once | INTRAVENOUS | Status: AC
  Administered 2023-04-28: 1250 mg via INTRAVENOUS
  Filled 2023-04-28: qty 250

## 2023-04-28 MED ORDER — JUVEN PO PACK
1.0000 | PACK | Freq: Two times a day (BID) | ORAL | Status: DC
  Administered 2023-04-29 – 2023-05-04 (×10): 1

## 2023-04-28 MED ORDER — DOPAMINE-DEXTROSE 3.2-5 MG/ML-% IV SOLN
INTRAVENOUS | Status: AC
  Filled 2023-04-28: qty 250

## 2023-04-28 MED ORDER — PRISMASOL BGK 2/3.5 32-2-3.5 MEQ/L EC SOLN: Status: DC

## 2023-04-28 MED ORDER — OSELTAMIVIR PHOSPHATE 6 MG/ML PO SUSR
75.0000 mg | Freq: Every day | ORAL | Status: AC
  Administered 2023-04-28 – 2023-04-29 (×2): 75 mg
  Filled 2023-04-28 (×2): qty 12.5

## 2023-04-28 MED ORDER — DOPAMINE-DEXTROSE 3.2-5 MG/ML-% IV SOLN
0.0000 ug/kg/min | INTRAVENOUS | Status: DC
  Administered 2023-04-28: 5 ug/kg/min via INTRAVENOUS

## 2023-04-28 MED ORDER — VITAL HIGH PROTEIN PO LIQD
1000.0000 mL | ORAL | Status: DC
  Administered 2023-04-28 – 2023-05-02 (×5): 1000 mL

## 2023-04-28 MED ORDER — PRISMASOL BGK 2/3.5 32-2-3.5 MEQ/L EC SOLN
Status: DC
  Administered 2023-04-29 (×3): 1500 mL/h via INTRAVENOUS_CENTRAL

## 2023-04-28 MED ORDER — PROSOURCE TF20 ENFIT COMPATIBL EN LIQD
60.0000 mL | Freq: Every day | ENTERAL | Status: DC
  Administered 2023-04-29 – 2023-05-04 (×5): 60 mL
  Filled 2023-04-28: qty 60

## 2023-04-28 MED ORDER — ADULT MULTIVITAMIN W/MINERALS CH
1.0000 | ORAL_TABLET | Freq: Every day | ORAL | Status: DC
  Administered 2023-04-29 – 2023-05-04 (×7): 1
  Filled 2023-04-28 (×5): qty 1

## 2023-04-28 MED ORDER — MIDAZOLAM HCL 2 MG/2ML IJ SOLN: 2.0000 mg | INTRAMUSCULAR | Status: DC | PRN

## 2023-04-28 NOTE — Inpatient Diabetes Management (Signed)
 Inpatient Diabetes Program Recommendations  AACE/ADA: New Consensus Statement on Inpatient Glycemic Control   Target Ranges:  Prepandial:   less than 140 mg/dL      Peak postprandial:   less than 180 mg/dL (1-2 hours)      Critically ill patients:  140 - 180 mg/dL    Latest Reference Range & Units 04/27/23 07:51 04/27/23 11:29 04/27/23 15:16 04/27/23 19:37 04/27/23 23:16 04/28/23 03:33 04/28/23 07:48  Glucose-Capillary 70 - 99 mg/dL 161 (H) 096 (H) 045 (H) 236 (H) 206 (H) 243 (H) 204 (H)   Review of Glycemic Control  Diabetes history: DM2 Outpatient Diabetes medications: Mounjaro 10 mg Qweek Current orders for Inpatient glycemic control: Semglee 15 units BID (was 10 units BID), Novolog 0-20 units Q4H; Solucortef @ 50 mg Q6H, Vital @ 30 ml/hr  Inpatient Diabetes Program Recommendations:    Insulin: Noted Semglee increased today. If steroids and tube feeds are continued, please consider ordering Novolog 4 units Q4H for tube feeding coverage. If tube feeding is stopped or held then Novolog tube feeding coverage should also be stopped or held.  Thanks, Orlando Penner, RN, MSN, CDCES Diabetes Coordinator Inpatient Diabetes Program [phone] (Team Pager from 8am to 5pm)

## 2023-04-28 NOTE — Consult Note (Signed)
 PHARMACY CONSULT NOTE - ELECTROLYTES  Pharmacy Consult for Electrolyte Monitoring and Replacement   Recent Labs:  Height: 5\' 8"  (172.7 cm) Weight: 117.9 kg (259 lb 14.8 oz) IBW/kg (Calculated) : 63.9 Estimated Creatinine Clearance: 29.5 mL/min (A) (by C-G formula based on SCr of 2.43 mg/dL (H)). Potassium (mmol/L)  Date Value  04/28/2023 4.8  11/16/2013 3.5   Magnesium (mg/dL)  Date Value  16/12/9602 2.4  11/16/2013 1.0 (L)   Calcium (mg/dL)  Date Value  54/11/8117 7.8 (L)   Calcium, Total (mg/dL)  Date Value  14/78/2956 9.1   Albumin (g/dL)  Date Value  21/30/8657 2.2 (L)  11/16/2013 3.8   Phosphorus (mg/dL)  Date Value  84/69/6295 2.9   Sodium (mmol/L)  Date Value  04/28/2023 134 (L)  11/16/2013 133 (L)    Assessment  Sarah Norton is a 68 y.o. female presenting with respiratory failure and unresponsive. PMH significant for DM and COPD. Due to high BG started an insulin gtt to target POC 140 - 180. Patient Pharmacy has been consulted to monitor and replace electrolytes. Now on CRRT.   Diet: npo Pertinent medications: free water 30 ml q4H  Goal of Therapy: Electrolytes WNL K = 4.8 Mg = 2.4 Phos = 2.9 Corrected Calcium = 9.2 Plan:  No replacement needed  Continue to monitor with renal function panel at 0500 and 1600 daily  Thank you for allowing pharmacy to be a part of this patient's care.  Effie Shy, PharmD Pharmacy Resident  04/28/2023 6:53 AM

## 2023-04-28 NOTE — Progress Notes (Signed)
 [facility] [place], [place] 04/28/23  Subjective:   Hospital day # 4 Patient remains critically ill intubated and sedated. cvs: Requiring pressors, norepinephrine, vasopressin restarted pulm: Ventilator assisted.  FiO2 40%/PEEP 8 gi: Tube feeds at 30 cc/h via OG tube.  Rectal tube in place Renal: Continued on CRRT  02/16 0701 - 02/17 0700 In: 3326.1 [P.O.:90; I.V.:1824.5; NG/GT:720; IV Piggyback:649.6] Out: 4144 [Urine:130; Stool:90] Lab Results  Component Value Date   CREATININE 1.73 (H) 04/28/2023   CREATININE 2.43 (H) 04/28/2023   CREATININE 2.87 (H) 04/27/2023     Objective:  Vital signs in last 24 hours:  Temp:  [98.6 F (37 C)-99.7 F (37.6 C)] 98.8 F (37.1 C) (02/17 1630) Pulse Rate:  [39-111] 53 (02/17 1630) Resp:  [14-23] 22 (02/17 1630) BP: (97-153)/(45-68) 142/55 (02/17 1600) SpO2:  [94 %-99 %] 95 % (02/17 1630) Arterial Line BP: (76-189)/(30-62) 147/48 (02/17 1630) FiO2 (%):  [40 %-50 %] 40 % (02/17 1617) Weight:  [117.9 kg] 117.9 kg (02/17 0500)  Weight change: 0.5 kg Filed Weights   04/26/23 0457 04/27/23 0437 04/28/23 0500  Weight: 115.5 kg 117.4 kg 117.9 kg    Intake/Output:    Intake/Output Summary (Last 24 hours) at 04/28/2023 1645 Last data filed at 04/28/2023 1600 Gross per 24 hour  Intake 3480.06 ml  Output 4477 ml  Net -996.94 ml     Physical Exam: General Appearance-critically ill-appearing female, laying in the bed HEENT-ET tube in place, OG tube in place Pulmonary-ventilator assisted.  Clear to auscultation bilaterally Cardiac-regular rhythm noted on telemetry Abdomen-nondistended Extremities-SCDs in place, trace edema Skin-no acute rashes noted Neuro-sedated Right IJ temporary dialysis catheter Foley in place Rectal tube in place  Basic Metabolic Panel:  Recent Labs  Lab 04/25/23 0411 04/25/23 1443 04/25/23 1848 04/26/23 0357 04/26/23 0731 04/26/23 1824 04/27/23 0424 04/27/23 1551  04/28/23 0309 04/28/23 1551  NA 133* 136   < > 133*  --  132* 134* 134* 134* 133*  K 4.2 3.5   < > 4.1  --  4.0 4.2 4.5 4.8 5.0  CL 97* 98   < > 94*  --  99 99 100 101 102  CO2 25 24   < > 22  --  19* 21* 23 23 25   GLUCOSE 474* 171*   < > 226*  --  280* 168* 258* 249* 235*  BUN 64* 69*   < > 79*  --  86* 72* 52* 48* 44*  CREATININE 2.38* 2.82*   < > 4.02*  --  4.73* 3.86* 2.87* 2.43* 1.73*  CALCIUM 8.2* 8.0*   < > 8.3*  --  7.7* 7.8* 8.0* 7.8* 7.7*  MG 2.0 1.9  --   --  2.0  --  2.2  --  2.4  --   PHOS 4.2 2.6  --  2.9  --   --  3.0 3.0 2.9 2.7   < > = values in this interval not displayed.     CBC: Recent Labs  Lab 04/24/23 1240 04/25/23 0411 04/26/23 0357 04/27/23 0424 04/28/23 0309  WBC 33.4* 43.9* 46.7* 41.5* 31.3*  NEUTROABS 28.0*  --   --   --   --   HGB 12.7 10.6* 9.8* 9.5* 10.0*  HCT 38.1 32.1* 29.1* 28.2* 30.3*  MCV 91.4 93.3 92.1 89.5 91.3  PLT 309 342 332 325 320     No results found for: "HEPBSAG", "HEPBSAB", "HEPBIGM"    Microbiology:  Recent Results (from the past 240 hours)  Culture, blood (Routine x 2)     Status: None (Preliminary result)   Collection Time: 04/24/23 12:40 PM   Specimen: BLOOD  Result Value Ref Range Status   Specimen Description BLOOD RIGHT ARM  Final   Special Requests   Final    BOTTLES DRAWN AEROBIC AND ANAEROBIC Blood Culture adequate volume   Culture   Final    NO GROWTH 4 DAYS Performed at [facility], [address], [place], [place] [address]    Report Status PENDING  Incomplete  Culture, blood (Routine x 2)     Status: None (Preliminary result)   Collection Time: 04/24/23 12:42 PM   Specimen: BLOOD  Result Value Ref Range Status   Specimen Description BLOOD LEFT ANTECUBITAL  Final   Special Requests   Final    BOTTLES DRAWN AEROBIC AND ANAEROBIC Blood Culture adequate volume   Culture   Final    NO GROWTH 4 DAYS Performed at [facility], [address] [place], [place] [address]    Report  Status PENDING  Incomplete  Resp panel by RT-PCR (RSV, Flu A&B, Covid) Anterior Nasal Swab     Status: Abnormal   Collection Time: 04/24/23 12:42 PM   Specimen: Anterior Nasal Swab  Result Value Ref Range Status   SARS Coronavirus 2 by RT PCR NEGATIVE NEGATIVE Final    Comment: (NOTE) SARS-CoV-2 target nucleic acids are NOT DETECTED.  The SARS-CoV-2 RNA is generally detectable in upper respiratory specimens during the acute phase of infection. The lowest concentration of SARS-CoV-2 viral copies this assay can detect is 138 copies/mL. A negative result does not preclude SARS-Cov-2 infection and should not be used as the sole basis for treatment or other patient management decisions. A negative result may occur with  improper specimen collection/handling, submission of specimen other than nasopharyngeal swab, presence of viral mutation(s) within the areas targeted by this assay, and inadequate number of viral copies(<138 copies/mL). A negative result must be combined with clinical observations, patient history, and epidemiological information. The expected result is Negative.  Fact Sheet for Patients:  BloggerCourse.com  Fact Sheet for Healthcare Providers:  SeriousBroker.it  This test is no t yet approved or cleared by the [place] FDA and  has been authorized for detection and/or diagnosis of SARS-CoV-2 by FDA under an Emergency Use Authorization (EUA). This EUA will remain  in effect (meaning this test can be used) for the duration of the COVID-19 declaration under Section 564(b)(1) of the Act, 21 U.S.C.section 360bbb-3(b)(1), unless the authorization is terminated  or revoked sooner.       Influenza A by PCR POSITIVE (A) NEGATIVE Final   Influenza B by PCR NEGATIVE NEGATIVE Final    Comment: (NOTE) The Xpert Xpress SARS-CoV-2/FLU/RSV plus assay is intended as an aid in the diagnosis of influenza from Nasopharyngeal swab  specimens and should not be used as a sole basis for treatment. Nasal washings and aspirates are unacceptable for Xpert Xpress SARS-CoV-2/FLU/RSV testing.  Fact Sheet for Patients: BloggerCourse.com  Fact Sheet for Healthcare Providers: SeriousBroker.it  This test is not yet approved or cleared by the [place] FDA and has been authorized for detection and/or diagnosis of SARS-CoV-2 by FDA under an Emergency Use Authorization (EUA). This EUA will remain in effect (meaning this test can be used) for the duration of the COVID-19 declaration under Section 564(b)(1) of the Act, 21 U.S.C. section 360bbb-3(b)(1), unless the authorization is terminated or revoked.     Resp Syncytial Virus by  PCR NEGATIVE NEGATIVE Final    Comment: (NOTE) Fact Sheet for Patients: BloggerCourse.com  Fact Sheet for Healthcare Providers: SeriousBroker.it  This test is not yet approved or cleared by the [place] FDA and has been authorized for detection and/or diagnosis of SARS-CoV-2 by FDA under an Emergency Use Authorization (EUA). This EUA will remain in effect (meaning this test can be used) for the duration of the COVID-19 declaration under Section 564(b)(1) of the Act, 21 U.S.C. section 360bbb-3(b)(1), unless the authorization is terminated or revoked.  Performed at [facility], [address] [place], [place] [address]   MRSA Next Gen by PCR, Nasal     Status: None   Collection Time: 04/24/23  9:52 PM   Specimen: Nasal Mucosa; Nasal Swab  Result Value Ref Range Status   MRSA by PCR Next Gen NOT DETECTED NOT DETECTED Final    Comment: (NOTE) The GeneXpert MRSA Assay (FDA approved for NASAL specimens only), is one component of a comprehensive MRSA colonization surveillance program. It is not intended to diagnose MRSA infection nor to guide or monitor treatment for MRSA  infections. Test performance is not FDA approved in patients less than 73 years old. Performed at [facility], [address] [place], [place] [address]   Respiratory (~20 pathogens) panel by PCR     Status: Abnormal   Collection Time: 04/25/23  1:27 PM   Specimen: Nasopharyngeal Swab; Respiratory  Result Value Ref Range Status   Adenovirus NOT DETECTED NOT DETECTED Final   Coronavirus 229E NOT DETECTED NOT DETECTED Final    Comment: (NOTE) The Coronavirus on the Respiratory Panel, DOES NOT test for the novel  Coronavirus (2019 nCoV)    Coronavirus HKU1 NOT DETECTED NOT DETECTED Final   Coronavirus NL63 NOT DETECTED NOT DETECTED Final   Coronavirus OC43 NOT DETECTED NOT DETECTED Final   Metapneumovirus NOT DETECTED NOT DETECTED Final   Rhinovirus / Enterovirus NOT DETECTED NOT DETECTED Final   Influenza A H3 DETECTED (A) NOT DETECTED Final   Influenza B NOT DETECTED NOT DETECTED Final   Parainfluenza Virus 1 NOT DETECTED NOT DETECTED Final   Parainfluenza Virus 2 NOT DETECTED NOT DETECTED Final   Parainfluenza Virus 3 NOT DETECTED NOT DETECTED Final   Parainfluenza Virus 4 NOT DETECTED NOT DETECTED Final   Respiratory Syncytial Virus NOT DETECTED NOT DETECTED Final   Bordetella pertussis NOT DETECTED NOT DETECTED Final   Bordetella Parapertussis NOT DETECTED NOT DETECTED Final   Chlamydophila pneumoniae NOT DETECTED NOT DETECTED Final   Mycoplasma pneumoniae NOT DETECTED NOT DETECTED Final    Comment: Performed at [facility], [address], [place], [place] [address]  Culture, Respiratory w Gram Stain     Status: None (Preliminary result)   Collection Time: 04/26/23  3:45 PM   Specimen: Tracheal Aspirate  Result Value Ref Range Status   Specimen Description   Final    TRACHEAL ASPIRATE Performed at [facility], [address] [place], [place] [address]    Special Requests   Final    NONE Performed at [facility], [address] [place], [place] [address]    Gram Stain   Final    RARE SQUAMOUS EPITHELIAL CELLS PRESENT WBC PRESENT,BOTH PMN AND MONONUCLEAR RARE GRAM POSITIVE COCCI    Culture   Final    NO GROWTH 2 DAYS Performed at [facility], [address], [place], [place] [address]    Report Status PENDING  Incomplete  Coagulation Studies: No results for input(s): "LABPROT", "INR" in the last 72 hours.  Urinalysis: No results for input(s): "COLORURINE", "LABSPEC", "PHURINE", "GLUCOSEU", "HGBUR", "BILIRUBINUR", "KETONESUR", "PROTEINUR", "UROBILINOGEN", "NITRITE", "LEUKOCYTESUR" in the last 72 hours.  Invalid input(s): "APPERANCEUR"    Imaging: DG Chest Port 1 View Result Date: 04/26/2023 CLINICAL DATA:  Check central line placed EXAM: PORTABLE CHEST 1 VIEW COMPARISON:  Film from earlier in the same day. FINDINGS: Endotracheal tube is noted in satisfactory position. Gastric catheter extends into the stomach although the proximal side port lies in the distal esophagus. This should be advanced deeper into the stomach. Left jugular central line is again seen. New right jugular central line is noted with the catheter tip in the proximal superior vena cava. No pneumothorax is noted. Stable parenchymal opacities are noted right greater than left. IMPRESSION: No pneumothorax following central line placement. Tubes and lines as described. Stable right-sided airspace opacity. Electronically Signed   By: Alcide Clever M.D.   On: 04/26/2023 19:35     Medications:    ceFEPime (MAXIPIME) IV Stopped (04/28/23 1021)   feeding supplement (VITAL HIGH PROTEIN)     fentaNYL infusion INTRAVENOUS 200 mcg/hr (04/28/23 1600)   heparin 1,650 Units/hr (04/28/23 1600)   norepinephrine (LEVOPHED) Adult infusion 6 mcg/min (04/28/23 1600)   prismasol BGK 4/2.5 400 mL/hr at 04/28/23 1105   prismasol BGK 4/2.5 400 mL/hr at 04/28/23 1105   prismasol BGK 4/2.5 1,500 mL/hr at 04/28/23 1355   propofol (DIPRIVAN) infusion 45  mcg/kg/min (04/28/23 1600)   vasopressin 0.03 Units/min (04/28/23 1600)    Chlorhexidine Gluconate Cloth  6 each Topical Daily   docusate  100 mg Per Tube BID   [START ON 04/29/2023] feeding supplement (PROSource TF20)  60 mL Per Tube Daily   free water  30 mL Per Tube Q4H   hydrocortisone sod succinate (SOLU-CORTEF) inj  50 mg Intravenous Q6H   insulin aspart  0-20 Units Subcutaneous Q4H   insulin glargine-yfgn  15 Units Subcutaneous BID   ipratropium-albuterol  3 mL Nebulization Q4H   levothyroxine  175 mcg Per Tube Q0600   multivitamin  1 tablet Per Tube QHS   [START ON 04/29/2023] multivitamin with minerals  1 tablet Per Tube Daily   [START ON 04/29/2023] nutrition supplement (JUVEN)  1 packet Per Tube BID BM   mouth rinse  15 mL Mouth Rinse Q2H   oseltamivir  75 mg Per Tube Daily   pantoprazole (PROTONIX) IV  40 mg Intravenous QHS   polyethylene glycol  17 g Per Tube Daily   sodium chloride flush  10-40 mL Intracatheter Q12H   thiamine  100 mg Per Tube Daily   vancomycin variable dose per unstable renal function (pharmacist dosing)   Does not apply See admin instructions   acetaminophen, docusate sodium, fentaNYL, fentaNYL (SUBLIMAZE) injection, fentaNYL (SUBLIMAZE) injection, heparin, mouth rinse, polyethylene glycol, sodium chloride flush  Assessment/ Plan:  68 y.o. female with  medical problems of  COPD, home oxygen, diabetes mellitus, coronary disease, hypertension  admitted on 04/24/2023 for COPD exacerbation (HCC) [J44.1] Acute respiratory failure with hypoxia and hypercarbia (HCC) [J96.01, J96.02] Acute respiratory failure with hypoxia and hypercapnia (HCC) [J96.01, J96.02] Community acquired pneumonia of right lower lobe of lung [J18.9]  Acute kidney injury on chronic kidney disease stage IIIa.  IV contrast nephropathy Baseline creatinine 1 7/GFR 46 on 04/24/2023 (admission)  Acute kidney injury likely secondary to ATN from concurrent illness/sepsis and IV contrast exposure  on 04/24/2023. Urinalysis-shows proteinuria of 100 mg, 0-5  WBCs, 0-5 RBCs. Patient remains oliguric.   She is continued on CRRT for metabolic optimization CRRT: 4K/2.5 Ca fluid.  Prefilter 400 mL/h, post filter 400 mL/h, dialysate 1500 mL/h UF goal 25 mL/h   Pneumonia, sepsis, hypotension Influenza A positive Currently requiring ventilator support and pressors. Antibiotic regimen includes cefepime, Tamiflu, vancomycin. Management as per ICU team.     LOS: 4 Ivory Bail Thedore Mins 2/17/20254:45 PM  [facility] [place] [facility] [place], [place] [phone]  Note: This note was prepared with Dragon dictation. Any transcription errors are unintentional

## 2023-04-28 NOTE — Consult Note (Signed)
 PHARMACY - ANTICOAGULATION CONSULT NOTE  Pharmacy Consult for Heparin Infusion Indication: atrial fibrillation  No Known Allergies  Patient Measurements: Height: 5\' 8"  (172.7 cm) Weight: 117.9 kg (259 lb 14.8 oz) IBW/kg (Calculated) : 63.9 Heparin Dosing Weight: 88.8 kg  Vital Signs: Temp: 98.8 F (37.1 C) (02/17 1455) Temp Source: Bladder (02/17 1200) BP: 137/61 (02/17 1400) Pulse Rate: 84 (02/17 1455)  Labs: Recent Labs    04/26/23 0357 04/26/23 1155 04/27/23 0424 04/27/23 0552 04/27/23 1336 04/27/23 1551 04/27/23 2115 04/28/23 0309 04/28/23 0616 04/28/23 1352  HGB 9.8*  --  9.5*  --   --   --   --  10.0*  --   --   HCT 29.1*  --  28.2*  --   --   --   --  30.3*  --   --   PLT 332  --  325  --   --   --   --  320  --   --   APTT  --    < >  --  72*   < >  --  62*  --  69* 74*  HEPARINUNFRC  --    < >  --  0.81*  --   --   --   --  0.65 0.61  CREATININE 4.02*   < > 3.86*  --   --  2.87*  --  2.43*  --   --   TROPONINIHS  --   --   --   --   --   --   --  49*  --   --    < > = values in this interval not displayed.   Estimated Creatinine Clearance: 29.5 mL/min (A) (by C-G formula based on SCr of 2.43 mg/dL (H)).  Medical History: Past Medical History:  Diagnosis Date   Arthritis    Asthma    Cataract of both eyes    Complication of anesthesia    woke up during surgery x1   COPD (chronic obstructive pulmonary disease) (HCC)    Coronary artery disease    DDD (degenerative disc disease), lumbar    Diabetes mellitus without complication (HCC)    Dyspnea    DUE TO COPD   Hernia of abdominal wall    History of hiatal hernia    small   Hyperlipidemia    Hypertension    Hypothyroidism    hashimotos throiditis  -thyroidectomy  2000   Occasional tremors    Oxygen desaturation    NOW PT ON 3-4 LITERS [place] DAILY   Medications:  Medications Prior to Admission  Medication Sig Dispense Refill Last Dose/Taking   albuterol (VENTOLIN HFA) 108 (90 Base) MCG/ACT  inhaler Inhale 2 puffs into the lungs every 4 (four) hours as needed for wheezing or shortness of breath.   Taking As Needed   allopurinol (ZYLOPRIM) 100 MG tablet Take 100 mg by mouth every morning.   Past Week   amLODipine (NORVASC) 5 MG tablet Take 5 mg by mouth every morning.   Past Week   atorvastatin (LIPITOR) 40 MG tablet Take 40 mg by mouth every morning.   Past Week   benazepril (LOTENSIN) 40 MG tablet Take 40 mg by mouth every morning.   Past Week   ELIQUIS 5 MG TABS tablet Take 5 mg by mouth 2 (two) times daily.   Past Week   fluticasone (FLONASE) 50 MCG/ACT nasal spray Place 2 sprays into both nostrils daily as needed for  allergies.   Past Week   fluticasone-salmeterol (ADVAIR HFA) 115-21 MCG/ACT inhaler Inhale 2 puffs by mouth twice daily 12 g 0 Past Week   furosemide (LASIX) 80 MG tablet Take 80 mg by mouth every morning.   Past Week   gabapentin (NEURONTIN) 600 MG tablet Take 600 mg by mouth 3 (three) times daily.   Past Week   hydrochlorothiazide (HYDRODIURIL) 25 MG tablet Take 25 mg by mouth every morning.   Past Week   indomethacin (INDOCIN) 25 MG capsule Take 25 mg by mouth 3 (three) times daily with meals.   Past Week   levocetirizine (XYZAL) 5 MG tablet Take 5 mg by mouth every evening.   Past Week   levothyroxine (SYNTHROID) 175 MCG tablet Take 175 mcg by mouth daily before breakfast.   Past Week   methocarbamol (ROBAXIN) 500 MG tablet Take 1-2 tablets by mouth every 6 (six) hours as needed for muscle spasms.   Past Week   Metoprolol Tartrate 75 MG TABS Take 1 tablet by mouth 2 (two) times daily.   Past Week   montelukast (SINGULAIR) 10 MG tablet Take 10 mg by mouth every morning.   Past Week   MOUNJARO 10 MG/0.5ML Pen Inject 10 mg into the skin once a week.   Past Week   [facility] powder Apply 1 Application topically 2 (two) times daily.   Past Week   promethazine (PHENERGAN) 25 MG tablet Take 25 mg by mouth every 6 (six) hours as needed for nausea or vomiting.   Taking As  Needed   Tiotropium Bromide Monohydrate (SPIRIVA RESPIMAT) 1.25 MCG/ACT AERS Inhale 2 puffs into the lungs daily for 30 doses. 3 each 0 Past Week   tiZANidine (ZANAFLEX) 4 MG tablet Take 4 mg by mouth 3 (three) times daily.   Past Week   tretinoin (RETIN-A) 0.05 % cream Apply to face qhs, wash off qam 45 g 11 Past Week   OXYGEN Inhale 3-4 L into the lungs daily.      Pertinent Medications: PTA apixaban, last dose unknown  Last dose heparin sq 2/15 @0522 .   Assessment: 68 year old female with a past medical history significant for COPD on home supplemental oxygen and diabetes mellitus who presented to [facility] ED on 04/24/2023 due to unresponsiveness. PMH of afib on apixaban. CHADSVASc 3. Started on CRRT 2/15. Pharmacy was consulted to dose and manage this patient's heparin.   Date Time aPTT/HL Rate/Comment  2/15 1947 45/---  1200/SUBtherapeutic 2/16 0552 72/0.81 1500/aPTT therapeutic  2/16    1336     67/--                1500/aPTT therapeutic    2/16 2115 62/--  1500/aPTT SUBtherapeutic 2/17 0616 69/0.65 1650/both levels therapeutic x 1 2/17 1352 74/0.61 1650/both levels therapeutic x 2  Baseline Labs:  Hgb 9.5 Plt 325 INR/PT 1.1/14.3  HL 0.67 - possibly has some false elevated due to apixaban.   Goal of Therapy:  Heparin level 0.3-0.7 units/ml aPTT 66-102 seconds Monitor platelets by anticoagulation protocol: Yes  Plan:  Both aPTT and HL therapeutic, correlating Heparin level therapeutic x 2 Continue heparin infusion at 1650 units/hr Check HL with AM labs and only check every 8 hours if there's a change in rate or disruption to the heparin gtt.  Follow for duration of heparin gtt to transition back to apixaban when appropriate Monitor CBC and HL daily while on heparin   Effie Shy, PharmD Pharmacy Resident  04/28/2023 3:00 PM

## 2023-04-28 NOTE — Progress Notes (Signed)
 NAME:  Sarah Norton, MRN:  [id number], DOB:  [birth year], LOS: 4 ADMISSION DATE:  04/24/2023  History of Present Illness:   68 year old female with a past medical history significant for COPD on home supplemental oxygen and diabetes mellitus who presented to [facility] ED on 04/24/2023 due to unresponsiveness.  Upon EMS arrival she was noted to be hypoxic with O2 saturations in the 40s for which she was placed on CPAP with improvement in sats to the 80s.  Upon arrival to the ED she remained somnolent but slightly arousable, and given her hypoxia, she was transitioned to BiPAP.  Pt is currently intubated and sedated, and no other history is currently available.    ED Course: Initial Vital Signs: Temperature 96.5 F axillary, pulse 87, respiratory rate 33, blood pressure 136/97, SpO2 78% Significant Labs: Sodium 134, glucose 313, BUN 59, creatinine 1.27, BNP 436, high-sensitivity troponin 139, lactic acid 1.7, WBC 33.4, D-dimer 2.13 VBG: pH 7.19/pCO2 82/pO2 39/bicarb 31.3 Positive for influenza A Urinalysis negative for UTI Imaging Chest X-ray>>IMPRESSION: *Bibasilar heterogeneous opacities, favored to represent multilobar pneumonia. Follow-up to clearing is recommended. CTa Chest>> pending currently Medications Administered: 1 L normal saline bolus, 125 mg Solu-Medrol, IV azithromycin and ceftriaxone, Levophed drip initiated   Despite BiPAP patient's ABG worsened with worsening mental status.  Therefore ED provider elected to intubate.  PCCM is asked to admit for further workup and treatment.   Please see "significant hospital events" section below for full detailed hospital course.  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   Significant Hospital Events: Including procedures, antibiotic start and stop dates in addition to other pertinent events   04/24/2023 - Admitted to the ICU intubated and MV.  04/25/2023 - Patient with improving  respiratory status however with worsening wbc and procalcitonin.  04/26/2023 - HD cath placed and started on CRRT. 2/17 remains on vent, on CRRT   Interim History / Subjective:  Remains critically ill Remains intubated Severe hypoxia Requires VENT support for survival  Vent Mode: PRVC FiO2 (%):  [45 %-50 %] 45 % Set Rate:  [22 bmp] 22 bmp Vt Set:  [450 mL-540 mL] 450 mL PEEP:  [10 cmH20] 10 cmH20 Plateau Pressure:  [21 cmH20] 21 cmH20    Objective   Blood pressure (!) 119/55, pulse 73, temperature 98.8 F (37.1 C), resp. rate (!) 22, height 5\' 8"  (1.727 m), weight 117.9 kg, SpO2 97%. CVP:  [4 mmHg-30 mmHg] 8 mmHg  Vent Mode: PRVC FiO2 (%):  [45 %-50 %] 45 % Set Rate:  [22 bmp] 22 bmp Vt Set:  [450 mL-540 mL] 450 mL PEEP:  [10 cmH20] 10 cmH20 Plateau Pressure:  [21 cmH20] 21 cmH20   Intake/Output Summary (Last 24 hours) at 04/28/2023 0948 Last data filed at 04/28/2023 0900 Gross per 24 hour  Intake 3207.31 ml  Output 4181 ml  Net -973.69 ml   Filed Weights   04/26/23 0457 04/27/23 0437 04/28/23 0500  Weight: 115.5 kg 117.4 kg 117.9 kg      REVIEW OF SYSTEMS  PATIENT IS UNABLE TO PROVIDE COMPLETE REVIEW OF SYSTEMS DUE TO SEVERE CRITICAL ILLNESS   PHYSICAL EXAMINATION:  GENERAL:critically ill appearing, +resp distress EYES: Pupils equal, round, reactive to light.  No scleral icterus.  MOUTH: Moist mucosal membrane. INTUBATED NECK: Supple.  PULMONARY: Lungs clear to auscultation, +rhonchi, +wheezing CARDIOVASCULAR: S1 and S2.  Regular rate and rhythm GASTROINTESTINAL: Soft, nontender, -distended. Positive bowel sounds.  MUSCULOSKELETAL: No  swelling, clubbing, or edema.  NEUROLOGIC: obtunded,sedated SKIN:normal, warm to touch, Capillary refill delayed  Pulses present bilaterally  Assessment & Plan:  68 year old female patient with past medical history of COPD with chronic hypoxic respiratory failure on 2 L nasal cannula presenting to [facility] with acute on chronic  hypoxic hypercapnic respiratory failure requiring intubation and mechanical ventilation on 02/13 secondary to ARDS, influenza type A with possible superimposed bacterial pneumonia, SEPTIC SHOCK WITH SEVERE COPD EXACERBATION    Severe ACUTE Hypoxic and Hypercapnic Respiratory Failure -continue Mechanical Ventilator support -Wean Fio2 and PEEP as tolerated -VAP/VENT bundle implementation - Wean PEEP & FiO2 as tolerated, maintain SpO2 > 88% - Head of bed elevated 30 degrees, VAP protocol in place - Plateau pressures less than 30 cm H20  - Intermittent chest x-ray & ABG PRN - Ensure adequate pulmonary hygiene  -will perform SAT/SBT when respiratory parameters are met    SEPTIC shock SOURCE-Pneumonia -use vasopressors to keep MAP>65 as needed -follow ABG and LA as needed -follow up cultures -emperic ABX   SEVERE COPD EXACERBATION -continue IV steroids as prescribed -continue NEB THERAPY as prescribed   ACUTE KIDNEY INJURY/Renal Failure -continue Foley Catheter-assess need -Avoid nephrotoxic agents -Follow urine output, BMP -Ensure adequate renal perfusion, optimize oxygenation -Renal dose medications   Intake/Output Summary (Last 24 hours) at 04/28/2023 0950 Last data filed at 04/28/2023 0900 Gross per 24 hour  Intake 3207.31 ml  Output 4181 ml  Net -973.69 ml    CARDIAC AFIb with RVR -follow up cardiac enzymes as indicated Demand Ischemia Continue heparin infusion    NEUROLOGY ACUTE METABOLIC ENCEPHALOPATHY -need for sedation -Goal RASS -2 to -3   ENDO - ICU hypoglycemic\Hyperglycemia protocol -check FSBS per protocol   GI GI PROPHYLAXIS as indicated NUTRITIONAL STATUS DIET-->TF's as tolerated Constipation protocol as indicated   ELECTROLYTES -follow labs as needed -replace as needed -pharmacy consultation and following  RESTRICTIVE TRANSFUSION PROTOCOL TRANSFUSION  IF HGB<7  or ACTIVE BLEEDING OR DX of ACUTE CORONARY SYNDROMES     Best  Practice (right click and "Reselect all SmartList Selections" daily)   Diet/type: tubefeeds DVT prophylaxis prophylactic heparin  Pressure ulcer(s): N/A GI prophylaxis: PPI Lines: Central line Foley:  Yes, and it is still needed Code Status:  full code      DVT/GI PRX  assessed I Assessed the need for Labs I Assessed the need for Foley I Assessed the need for Central Venous Line Family Discussion when available I Assessed the need for Mobilization I made an Assessment of medications to be adjusted accordingly Safety Risk assessment completed  CASE DISCUSSED IN MULTIDISCIPLINARY ROUNDS WITH ICU TEAM     Critical Care Time devoted to patient care services described in this note is 47 minutes.  Critical care was necessary to treat /prevent imminent and life-threatening deterioration. Overall, patient is critically ill, prognosis is guarded.  Patient with Multiorgan failure and at high risk for cardiac arrest and death.    Lucie Leather, M.D.  [facility]  Medical Director [facility] Medical Director [facility] Cardio-Pulmonary Department    [phone]

## 2023-04-28 NOTE — Progress Notes (Deleted)
 PHARMACY NOTE:  ANTIMICROBIAL RENAL DOSAGE ADJUSTMENT  Current antimicrobial regimen includes a mismatch between antimicrobial dosage and estimated renal function.  As per policy approved by the Pharmacy & Therapeutics and Medical Executive Committees, the antimicrobial dosage will be adjusted accordingly.  Current antimicrobial dosage:  Tamiflu 75 mg per tube daily  Indication: Influenza A infection  Renal Function: 29.5 mL/min   Estimated Creatinine Clearance: 29.5 mL/min (A) (by C-G formula based on SCr of 2.43 mg/dL (H)). []      On intermittent HD, scheduled: []      On CRRT    Antimicrobial dosage has been changed to:  Tamiflu 30 mg per tube daily   Additional comments: Monitor renal function for improvement   Thank you for allowing pharmacy to be a part of this patient's care.  Effie Shy, PharmD Pharmacy Resident  04/28/2023 7:38 AM

## 2023-04-28 NOTE — Plan of Care (Addendum)
 Patient continues to be intubated and sedated. Patient very agitated at beginning of shift, coughing over vent, fentanyl bolus given with little improvement noted; propofol increased per order at that time, with improvement noted. Patient continues to require CRRT, goal for removal is -25, patient is tolerating well. Heparin gtt infusing, 1300 unit bolus given this shift and rate increased to 1650 units/ml per pharmacy order. Patient tolerating tube feeds, infusing at goal rate. Aflutter on monitor. Oral care provided with repositioning. Episode of tachycardia/hypertension this morning, NP at bedside, verbal orders received for labs and titration of levophed for a map goal of >65. Total urine output 60 ml  Problem: Education: Goal: Knowledge of General Education information will improve Description: Including pain rating scale, medication(s)/side effects and non-pharmacologic comfort measures Outcome: Not Progressing   Problem: Clinical Measurements: Goal: Ability to maintain clinical measurements within normal limits will improve Outcome: Not Progressing Goal: Will remain free from infection Outcome: Not Progressing Goal: Diagnostic test results will improve Outcome: Not Progressing Goal: Respiratory complications will improve Outcome: Progressing Goal: Cardiovascular complication will be avoided Outcome: Progressing   Problem: Activity: Goal: Risk for activity intolerance will decrease Outcome: Progressing   Problem: Nutrition: Goal: Adequate nutrition will be maintained Outcome: Progressing   Problem: Coping: Goal: Level of anxiety will decrease Outcome: Progressing   Problem: Elimination: Goal: Will not experience complications related to bowel motility Outcome: Progressing Goal: Will not experience complications related to urinary retention Outcome: Progressing   Problem: Pain Managment: Goal: General experience of comfort will improve and/or be controlled Outcome:  Progressing   Problem: Safety: Goal: Ability to remain free from injury will improve Outcome: Progressing   Problem: Skin Integrity: Goal: Risk for impaired skin integrity will decrease Outcome: Progressing

## 2023-04-28 NOTE — Consult Note (Signed)
 PHARMACY - ANTICOAGULATION CONSULT NOTE  Pharmacy Consult for Heparin Infusion Indication: atrial fibrillation  No Known Allergies  Patient Measurements: Height: 5\' 8"  (172.7 cm) Weight: 117.9 kg (259 lb 14.8 oz) IBW/kg (Calculated) : 63.9 Heparin Dosing Weight: 88.8 kg  Vital Signs: Temp: 98.6 F (37 C) (02/17 0700) Temp Source: Bladder (02/17 0400) BP: 122/64 (02/17 0700) Pulse Rate: 68 (02/17 0700)  Labs: Recent Labs    04/26/23 0357 04/26/23 1155 04/26/23 1824 04/27/23 0424 04/27/23 0552 04/27/23 1336 04/27/23 1551 04/27/23 2115 04/28/23 0309 04/28/23 0616  HGB 9.8*  --   --  9.5*  --   --   --   --  10.0*  --   HCT 29.1*  --   --  28.2*  --   --   --   --  30.3*  --   PLT 332  --   --  325  --   --   --   --  320  --   APTT  --   --    < >  --  72* 67*  --  62*  --  69*  HEPARINUNFRC  --  0.67  --   --  0.81*  --   --   --   --  0.65  CREATININE 4.02*  --    < > 3.86*  --   --  2.87*  --  2.43*  --   TROPONINIHS  --   --   --   --   --   --   --   --  49*  --    < > = values in this interval not displayed.   Estimated Creatinine Clearance: 29.5 mL/min (A) (by C-G formula based on SCr of 2.43 mg/dL (H)).  Medical History: Past Medical History:  Diagnosis Date   Arthritis    Asthma    Cataract of both eyes    Complication of anesthesia    woke up during surgery x1   COPD (chronic obstructive pulmonary disease) (HCC)    Coronary artery disease    DDD (degenerative disc disease), lumbar    Diabetes mellitus without complication (HCC)    Dyspnea    DUE TO COPD   Hernia of abdominal wall    History of hiatal hernia    small   Hyperlipidemia    Hypertension    Hypothyroidism    hashimotos throiditis  -thyroidectomy  2000   Occasional tremors    Oxygen desaturation    NOW PT ON 3-4 LITERS [place] DAILY   Medications:  Medications Prior to Admission  Medication Sig Dispense Refill Last Dose/Taking   albuterol (VENTOLIN HFA) 108 (90 Base) MCG/ACT inhaler  Inhale 2 puffs into the lungs every 4 (four) hours as needed for wheezing or shortness of breath.   Taking As Needed   allopurinol (ZYLOPRIM) 100 MG tablet Take 100 mg by mouth every morning.   Past Week   amLODipine (NORVASC) 5 MG tablet Take 5 mg by mouth every morning.   Past Week   atorvastatin (LIPITOR) 40 MG tablet Take 40 mg by mouth every morning.   Past Week   benazepril (LOTENSIN) 40 MG tablet Take 40 mg by mouth every morning.   Past Week   ELIQUIS 5 MG TABS tablet Take 5 mg by mouth 2 (two) times daily.   Past Week   fluticasone (FLONASE) 50 MCG/ACT nasal spray Place 2 sprays into both nostrils daily as needed for allergies.  Past Week   fluticasone-salmeterol (ADVAIR HFA) 115-21 MCG/ACT inhaler Inhale 2 puffs by mouth twice daily 12 g 0 Past Week   furosemide (LASIX) 80 MG tablet Take 80 mg by mouth every morning.   Past Week   gabapentin (NEURONTIN) 600 MG tablet Take 600 mg by mouth 3 (three) times daily.   Past Week   hydrochlorothiazide (HYDRODIURIL) 25 MG tablet Take 25 mg by mouth every morning.   Past Week   indomethacin (INDOCIN) 25 MG capsule Take 25 mg by mouth 3 (three) times daily with meals.   Past Week   levocetirizine (XYZAL) 5 MG tablet Take 5 mg by mouth every evening.   Past Week   levothyroxine (SYNTHROID) 175 MCG tablet Take 175 mcg by mouth daily before breakfast.   Past Week   methocarbamol (ROBAXIN) 500 MG tablet Take 1-2 tablets by mouth every 6 (six) hours as needed for muscle spasms.   Past Week   Metoprolol Tartrate 75 MG TABS Take 1 tablet by mouth 2 (two) times daily.   Past Week   montelukast (SINGULAIR) 10 MG tablet Take 10 mg by mouth every morning.   Past Week   MOUNJARO 10 MG/0.5ML Pen Inject 10 mg into the skin once a week.   Past Week   [facility] powder Apply 1 Application topically 2 (two) times daily.   Past Week   promethazine (PHENERGAN) 25 MG tablet Take 25 mg by mouth every 6 (six) hours as needed for nausea or vomiting.   Taking As Needed    Tiotropium Bromide Monohydrate (SPIRIVA RESPIMAT) 1.25 MCG/ACT AERS Inhale 2 puffs into the lungs daily for 30 doses. 3 each 0 Past Week   tiZANidine (ZANAFLEX) 4 MG tablet Take 4 mg by mouth 3 (three) times daily.   Past Week   tretinoin (RETIN-A) 0.05 % cream Apply to face qhs, wash off qam 45 g 11 Past Week   OXYGEN Inhale 3-4 L into the lungs daily.      Assessment: 68 year old female with a past medical history significant for COPD on home supplemental oxygen and diabetes mellitus who presented to [facility] ED on 04/24/2023 due to unresponsiveness. PMH of afib on apixaban. Hgb is trending down. Last dose heparin sq 2/15 @0522 . CHADSVASc 3. Last dose apixaban unknown. Baseline heparin level was 0.67, possibly has some false elevated due to apixaban.     Date Time aPTT/HL Rate/Comment  2/15 1947 45/---  1200/SUBtherapeutic 2/16 0552 72/0.81 1500/aPTT therapeutic  2/16    1336     67/--                1500/aPTT therapeutic    2/16 2115 62/--  1500/aPTT SUBtherapeutic 2/17 0616 69/0.65 1650/both levels therapeutic  Goal of Therapy:  Heparin level 0.3-0.7 units/ml aPTT 66-102 seconds Monitor platelets by anticoagulation protocol: Yes  Plan:  Both aPTT and HL therapeutic, but not fully correlating Continue heparin infusion at 1650 units/hr  Will continue with aPTT monitoring until correlation with HL  Check next aPTT/HL in 8 hours Monitor CBC and HL daily   Bettey Costa, PharmD Clinical Pharmacist 04/28/2023 7:10 AM

## 2023-04-28 NOTE — Consult Note (Signed)
 Pharmacy Antibiotic Note  Sarah Norton is a 68 y.o. female admitted on 04/24/2023 with pneumonia. They have a past medical history significant for COPD on home oxygen. They presented due to unresponsiveness and was intubated during this admission. Patient with air trapping. Pharmacy has been consulted for Vancomycin and Cefepime dosing.  Today, 04/28/2023 Scr 4.73 > 3.86 >> 2.43 (baseline ~ 0.72), now on CRRT.  WBC 43.9 > 46 > 41 >> 31.3  afeb last 24 hours 2/14: Persistent bibasilar airspace disease; more prominent on left compared to right   Plan: Day 5 of antibiotics Vancomycin 2000 mg loading dose given 2/13 @2244 .  ~48 hour random level was 16 and 12 after ~ 56 hours.  Today 2/17 random level at ~24 hours is 11 Plan to redose at 1,250 g vancomycin IV x 1 today - slight increase due to slightly lower random level from yesterday's 1g x 1  Recheck vancomycin random ~24 hour post dose   Height: 5\' 8"  (172.7 cm) Weight: 117.9 kg (259 lb 14.8 oz) IBW/kg (Calculated) : 63.9  Temp (24hrs), Avg:99.2 F (37.3 C), Min:98.6 F (37 C), Max:99.7 F (37.6 C)  Recent Labs  Lab 04/24/23 1240 04/24/23 1738 04/25/23 0411 04/25/23 1443 04/26/23 0357 04/26/23 1824 04/26/23 2114 04/27/23 0424 04/27/23 0552 04/27/23 1551 04/28/23 0309  WBC 33.4*  --  43.9*  --  46.7*  --   --  41.5*  --   --  31.3*  CREATININE 1.27*  --  2.38*   < > 4.02* 4.73*  --  3.86*  --  2.87* 2.43*  LATICACIDVEN 1.7 1.2  --   --   --   --   --   --   --   --   --   VANCORANDOM  --   --   --    < > 18  --  16  --  12  --   --    < > = values in this interval not displayed.    Estimated Creatinine Clearance: 29.5 mL/min (A) (by C-G formula based on SCr of 2.43 mg/dL (H)).    No Known Allergies  Antimicrobials this admission: 2/13 ceftriaxone x 1 azithromycin 2/13 >>  vancomycin 2/13 >>  cefepime 2/14 >>   Dose adjustments this admission:  Ceftriaxone x 1  Microbiology results: 2/13 MRSA PCR:  negative 2/13 BCx: NGTD  2/14 Respiratory Panel: Influenza A  2/15 Gram stain respiratory culture: rare GPC  Thank you for allowing pharmacy to be a part of this patient's care.  Effie Shy, PharmD Pharmacy Resident  04/28/2023 7:45 AM

## 2023-04-28 NOTE — Progress Notes (Addendum)
 Nutrition Follow-up  DOCUMENTATION CODES:   Obesity unspecified  INTERVENTION:   Change to Vital HP @50ml /hr continuous + ProSource TF 20- Give 60ml daily via tube  Propofol: 33.8 ml/hr- provides 892kcal/day   Free water flushes 30ml q4 hours to maintain tube patency   Regimen provides 2172kcal/day, 125g/day of protein and 1173ml/day of free water.   Juven Fruit Punch BID via tube, each serving provides 95kcal and 2.5g of protein (amino acids glutamine and arginine)  MVI daily via tube   Rena-vit daily via tube  Daily weights   NUTRITION DIAGNOSIS:   Inadequate oral intake related to inability to eat as evidenced by NPO status. -ongoing   GOAL:   Provide needs based on ASPEN/SCCM guidelines -met   MONITOR:   Vent status, Labs, Weight trends, TF tolerance, I & O's, Skin  ASSESSMENT:   68 y/o female with h/o DM, HTN, thyroid disease s/p thyroidectomy, COPD, hiatal hernia s/p repair, polycythemia, HLD, DDD, Afib, CAD and CKD III who is admitted with Flu A, AKI, sepsis, shock, PNA and COPD exacerbation.  Pt remains sedated and ventilated. OGT in place. Pt is tolerating tube feeds at goal rate; will adjust in the setting of propofol changes. Refeed labs stable. CRRT initiated 2/15. Per chart, pt is up ~18lbs since admission. Pt +9.2L on her I & Os.   Medications reviewed and include: colace, solu-cortef, insulin, synthroid, rena-vit, protonix, miralax, thiamine, azithromycin, cefepime, heparin, levophed, propofol, vasopressin   Labs reviewed: Na 134(L), K 4.8 wnl, BUN 48(H), creat 2.43(H) Wbc- 31.3(H), Hgb 10.0(L), Hct 30.3(L) Cbgs- 195, 204, 243 x 24 hrs  AIC 6.5(H)- 2/14  Patient is currently intubated on ventilator support MV: 11.7 L/min Temp (24hrs), Avg:99.2 F (37.3 C), Min:98.6 F (37 C), Max:99.7 F (37.6 C)  Propofol: 33.8 ml/hr- provides 892kcal/day   MAP >49mmHg   UOP-   Diet Order:   Diet Order             Diet NPO time specified  Diet  effective now                  EDUCATION NEEDS:   Not appropriate for education at this time  Skin:  Skin Assessment: Skin Integrity Issues: Skin Integrity Issues:: Stage I Stage I: anus  Last BM:  2/17- 90ml via rectal tube  Height:   Ht Readings from Last 1 Encounters:  04/24/23 5\' 8"  (1.727 m)    Weight:   Wt Readings from Last 1 Encounters:  04/28/23 117.9 kg    Ideal Body Weight:  63.6 kg  BMI:  Body mass index is 39.52 kg/m.  Estimated Nutritional Needs:   Kcal:  1800-2100kcal/day  Protein:  > 127 grams  Fluid:  1.7-1.9L/day  Betsey Holiday MS, RD, LDN If unable to be reached, please send secure chat to "RD inpatient" available from 8:00a-4:00p daily

## 2023-04-28 NOTE — TOC Progression Note (Signed)
 Transition of Care [facility]) - Progression Note    Patient Details  Name: Sarah Norton MRN: [id number] Date of Birth: [birth year]  Transition of [facility] CM/SW Contact  Allena Katz, LCSW Phone Number: 04/28/2023, 12:19 PM  Clinical Narrative:  Pt currently intubated and CSW unable to assess needs. Will continue to follow.          Expected Discharge Plan and Services                                               Social Determinants of Health (SDOH) Interventions SDOH Screenings   Food Insecurity: Patient Unable To Answer (04/24/2023)  Housing: Patient Unable To Answer (04/24/2023)  Transportation Needs: Patient Unable To Answer (04/24/2023)  Utilities: Patient Unable To Answer (04/24/2023)  Social Connections: Patient Unable To Answer (04/24/2023)  Tobacco Use: Medium Risk (05/17/2022)    Readmission Risk Interventions     No data to display

## 2023-04-28 NOTE — Progress Notes (Signed)
 1700- Pt's HR dropping into the 30s. Propofol titrated down. When pt stimulated HR will increase to 50-60s. Dr. Belia Heman made aware and dopamine ordered to start at . 1724- Dopamine started at 1738- HR in 70s but aline BP 211/60 (95). Vaso and levo paused. 1820- HR 147, dopamine stopped.  1830- BP starting to drop, vaso and levo restarted. Dr. Belia Heman called to bedside. 12 lead EKG obtained and looked at by Dr. Belia Heman (EKG shows ST) 1845- HR back in the 70s, BP 122/46 (62), actively titrating levo for MAP of 65  Dr. Belia Heman aware and assessed pt at bedside. Also made aware that patient became very agitated after decreasing propofol so prop and fentanyl increased and are at max. Versed push ordered by Dr. Belia Heman.

## 2023-04-29 DIAGNOSIS — J9602 Acute respiratory failure with hypercapnia: Secondary | ICD-10-CM | POA: Diagnosis not present

## 2023-04-29 DIAGNOSIS — R6521 Severe sepsis with septic shock: Secondary | ICD-10-CM | POA: Diagnosis not present

## 2023-04-29 DIAGNOSIS — J9601 Acute respiratory failure with hypoxia: Secondary | ICD-10-CM | POA: Diagnosis not present

## 2023-04-29 DIAGNOSIS — A419 Sepsis, unspecified organism: Secondary | ICD-10-CM | POA: Diagnosis not present

## 2023-04-29 LAB — RENAL FUNCTION PANEL
Albumin: 2.3 g/dL — ABNORMAL LOW (ref 3.5–5.0)
Albumin: 2 g/dL — ABNORMAL LOW (ref 3.5–5.0)
Anion gap: 9 (ref 5–15)
Anion gap: 7 (ref 5–15)
BUN: 38 mg/dL — ABNORMAL HIGH (ref 8–23)
BUN: 37 mg/dL — ABNORMAL HIGH (ref 8–23)
CO2: 24 mmol/L (ref 22–32)
CO2: 26 mmol/L (ref 22–32)
Calcium: 8.7 mg/dL — ABNORMAL LOW (ref 8.9–10.3)
Calcium: 8.8 mg/dL — ABNORMAL LOW (ref 8.9–10.3)
Chloride: 98 mmol/L (ref 98–111)
Chloride: 101 mmol/L (ref 98–111)
Creatinine, Ser: 1.31 mg/dL — ABNORMAL HIGH (ref 0.44–1.00)
Creatinine, Ser: 1.01 mg/dL — ABNORMAL HIGH (ref 0.44–1.00)
GFR, Estimated: 44 mL/min — ABNORMAL LOW (ref >60)
GFR, Estimated: >60 mL/min (ref >60)
Glucose, Bld: 235 mg/dL — ABNORMAL HIGH (ref 70–99)
Glucose, Bld: 155 mg/dL — ABNORMAL HIGH (ref 70–99)
Phosphorus: 2.7 mg/dL (ref 2.5–4.6)
Phosphorus: 2.3 mg/dL — ABNORMAL LOW (ref 2.5–4.6)
Potassium: 4.4 mmol/L (ref 3.5–5.1)
Potassium: 3.6 mmol/L (ref 3.5–5.1)
Sodium: 131 mmol/L — ABNORMAL LOW (ref 135–145)
Sodium: 134 mmol/L — ABNORMAL LOW (ref 135–145)

## 2023-04-29 LAB — BLOOD GAS, ARTERIAL
Acid-Base Excess: 1.5 mmol/L (ref 0.0–2.0)
Bicarbonate: 27.2 mmol/L (ref 20.0–28.0)
FIO2: 40 %
MECHVT: 540 mL
Mechanical Rate: 22
O2 Saturation: 98.9 %
PEEP: 8 cmH2O
Patient temperature: 37
pCO2 arterial: 46 mm[Hg] (ref 32–48)
pH, Arterial: 7.38 (ref 7.35–7.45)
pO2, Arterial: 83 mm[Hg] (ref 83–108)

## 2023-04-29 LAB — GLUCOSE, CAPILLARY
Glucose-Capillary: 218 mg/dL — ABNORMAL HIGH (ref 70–99)
Glucose-Capillary: 177 mg/dL — ABNORMAL HIGH (ref 70–99)
Glucose-Capillary: 151 mg/dL — ABNORMAL HIGH (ref 70–99)
Glucose-Capillary: 154 mg/dL — ABNORMAL HIGH (ref 70–99)
Glucose-Capillary: 165 mg/dL — ABNORMAL HIGH (ref 70–99)
Glucose-Capillary: 164 mg/dL — ABNORMAL HIGH (ref 70–99)

## 2023-04-29 LAB — CULTURE, BLOOD (ROUTINE X 2)
Culture  Setup Time: NONE SEEN
Special Requests: ADEQUATE
Special Requests: ADEQUATE

## 2023-04-29 LAB — CBC
HCT: 28.5 % — ABNORMAL LOW (ref 36.0–46.0)
Hemoglobin: 9.7 g/dL — ABNORMAL LOW (ref 12.0–15.0)
MCH: 30.4 pg (ref 26.0–34.0)
MCHC: 34 g/dL (ref 30.0–36.0)
MCV: 89.3 fL (ref 80.0–100.0)
Platelets: 280 10*3/uL (ref 150–400)
RBC: 3.19 MIL/uL — ABNORMAL LOW (ref 3.87–5.11)
RDW: 16.9 % — ABNORMAL HIGH (ref 11.5–15.5)
WBC: 26.2 10*3/uL — ABNORMAL HIGH (ref 4.0–10.5)
nRBC: 0.2 % (ref 0.0–0.2)

## 2023-04-29 LAB — CULTURE, RESPIRATORY W GRAM STAIN

## 2023-04-29 LAB — MAGNESIUM: Magnesium: 2.3 mg/dL (ref 1.7–2.4)

## 2023-04-29 LAB — VANCOMYCIN, RANDOM: Vancomycin Rm: 16 ug/mL

## 2023-04-29 LAB — HEPARIN LEVEL (UNFRACTIONATED): Heparin Unfractionated: 0.62 [IU]/mL (ref 0.30–0.70)

## 2023-04-29 MED ORDER — SODIUM CHLORIDE 0.9 % IV SOLN: 1.0000 g | INTRAVENOUS | Status: DC

## 2023-04-29 MED ORDER — HYDROCORTISONE SOD SUC (PF) 100 MG IJ SOLR: 20.0000 mg | Freq: Every day | INTRAMUSCULAR | Status: DC

## 2023-04-29 MED ORDER — METHYLPREDNISOLONE SODIUM SUCC 40 MG IJ SOLR
20.0000 mg | Freq: Every day | INTRAMUSCULAR | Status: DC
  Administered 2023-04-30 – 2023-05-04 (×5): 20 mg via INTRAVENOUS
  Filled 2023-04-29 (×5): qty 1

## 2023-04-29 MED ORDER — LINEZOLID 600 MG PO TABS
600.0000 mg | ORAL_TABLET | Freq: Two times a day (BID) | ORAL | Status: DC
  Administered 2023-04-29 – 2023-04-30 (×3): 600 mg
  Filled 2023-04-29 (×3): qty 1

## 2023-04-29 NOTE — Inpatient Diabetes Management (Signed)
 Inpatient Diabetes Program Recommendations  AACE/ADA: New Consensus Statement on Inpatient Glycemic Control   Target Ranges:  Prepandial:   less than 140 mg/dL      Peak postprandial:   less than 180 mg/dL (1-2 hours)      Critically ill patients:  140 - 180 mg/dL    Latest Reference Range & Units 04/28/23 07:48 04/28/23 12:08 04/28/23 15:49 04/28/23 19:22 04/28/23 23:14 04/29/23 03:33  Glucose-Capillary 70 - 99 mg/dL 308 (H) 657 (H) 846 (H) 180 (H) 182 (H) 218 (H)    Review of Glycemic Control  Diabetes history: DM2 Outpatient Diabetes medications: Mounjaro 10 mg Qweek Current orders for Inpatient glycemic control: Semglee 15 units BID, Novolog 0-20 units Q4H; Solucortef @ 50 mg Q6H, Vital @ 30 ml/hr   Inpatient Diabetes Program Recommendations:     Insulin:  If steroids and tube feeds are continued, please consider ordering Novolog 4 units Q4H for tube feeding coverage. If tube feeding is stopped or held then Novolog tube feeding coverage should also be stopped or held.  Thanks, Orlando Penner, RN, MSN, CDCES Diabetes Coordinator Inpatient Diabetes Program [phone] (Team Pager from 8am to 5pm)

## 2023-04-29 NOTE — Progress Notes (Signed)
 0710 Levophed stopped. 0900 Failed SBT. Became too agitated and would not follow commands.  1000 CRRT running without issues.  Patient spontaneously moves right arm. Bilateral mitts discontinued. Given bath without issues. 1100 Tolerating Fentanyl at 170 mcg/hr. and Propofol at 45 mcg/kg/min.  1200 Tolerating turns.Blood pressure up and down. When agitated 200/99 and when calm 130/58.  1400 Arterial line discontinued per verbal order-Dr. Belia Heman. 1600 Afebrile at 99.5 most of the afternoon. 1800 Son in to visit.

## 2023-04-29 NOTE — Consult Note (Signed)
 PHARMACY - ANTICOAGULATION CONSULT NOTE  Pharmacy Consult for Heparin Infusion Indication: atrial fibrillation  No Known Allergies  Patient Measurements: Height: 5\' 8"  (172.7 cm) Weight: 117.5 kg (259 lb 0.7 oz) IBW/kg (Calculated) : 63.9 Heparin Dosing Weight: 88.8 kg  Vital Signs: Temp: 98.4 F (36.9 C) (02/18 0600) Temp Source: Bladder (02/18 0400) BP: 127/58 (02/18 0500) Pulse Rate: 72 (02/18 0600)  Labs: Recent Labs    04/27/23 0424 04/27/23 0552 04/27/23 2115 04/28/23 0309 04/28/23 0616 04/28/23 1352 04/28/23 1551 04/29/23 0415  HGB 9.5*  --   --  10.0*  --   --   --  9.7*  HCT 28.2*  --   --  30.3*  --   --   --  28.5*  PLT 325  --   --  320  --   --   --  280  APTT  --    < > 62*  --  69* 74*  --   --   HEPARINUNFRC  --    < >  --   --  0.65 0.61  --  0.62  CREATININE 3.86*   < >  --  2.43*  --   --  1.73* 1.31*  TROPONINIHS  --   --   --  49*  --   --   --   --    < > = values in this interval not displayed.   Estimated Creatinine Clearance: 54.6 mL/min (A) (by C-G formula based on SCr of 1.31 mg/dL (H)).  Medical History: Past Medical History:  Diagnosis Date   Arthritis    Asthma    Cataract of both eyes    Complication of anesthesia    woke up during surgery x1   COPD (chronic obstructive pulmonary disease) (HCC)    Coronary artery disease    DDD (degenerative disc disease), lumbar    Diabetes mellitus without complication (HCC)    Dyspnea    DUE TO COPD   Hernia of abdominal wall    History of hiatal hernia    small   Hyperlipidemia    Hypertension    Hypothyroidism    hashimotos throiditis  -thyroidectomy  2000   Occasional tremors    Oxygen desaturation    NOW PT ON 3-4 LITERS [place] DAILY   Medications:  Medications Prior to Admission  Medication Sig Dispense Refill Last Dose/Taking   albuterol (VENTOLIN HFA) 108 (90 Base) MCG/ACT inhaler Inhale 2 puffs into the lungs every 4 (four) hours as needed for wheezing or shortness of breath.    Taking As Needed   allopurinol (ZYLOPRIM) 100 MG tablet Take 100 mg by mouth every morning.   Past Week   amLODipine (NORVASC) 5 MG tablet Take 5 mg by mouth every morning.   Past Week   atorvastatin (LIPITOR) 40 MG tablet Take 40 mg by mouth every morning.   Past Week   benazepril (LOTENSIN) 40 MG tablet Take 40 mg by mouth every morning.   Past Week   ELIQUIS 5 MG TABS tablet Take 5 mg by mouth 2 (two) times daily.   Past Week   fluticasone (FLONASE) 50 MCG/ACT nasal spray Place 2 sprays into both nostrils daily as needed for allergies.   Past Week   fluticasone-salmeterol (ADVAIR HFA) 115-21 MCG/ACT inhaler Inhale 2 puffs by mouth twice daily 12 g 0 Past Week   furosemide (LASIX) 80 MG tablet Take 80 mg by mouth every morning.   Past Week  gabapentin (NEURONTIN) 600 MG tablet Take 600 mg by mouth 3 (three) times daily.   Past Week   hydrochlorothiazide (HYDRODIURIL) 25 MG tablet Take 25 mg by mouth every morning.   Past Week   indomethacin (INDOCIN) 25 MG capsule Take 25 mg by mouth 3 (three) times daily with meals.   Past Week   levocetirizine (XYZAL) 5 MG tablet Take 5 mg by mouth every evening.   Past Week   levothyroxine (SYNTHROID) 175 MCG tablet Take 175 mcg by mouth daily before breakfast.   Past Week   methocarbamol (ROBAXIN) 500 MG tablet Take 1-2 tablets by mouth every 6 (six) hours as needed for muscle spasms.   Past Week   Metoprolol Tartrate 75 MG TABS Take 1 tablet by mouth 2 (two) times daily.   Past Week   montelukast (SINGULAIR) 10 MG tablet Take 10 mg by mouth every morning.   Past Week   MOUNJARO 10 MG/0.5ML Pen Inject 10 mg into the skin once a week.   Past Week   [facility] powder Apply 1 Application topically 2 (two) times daily.   Past Week   promethazine (PHENERGAN) 25 MG tablet Take 25 mg by mouth every 6 (six) hours as needed for nausea or vomiting.   Taking As Needed   Tiotropium Bromide Monohydrate (SPIRIVA RESPIMAT) 1.25 MCG/ACT AERS Inhale 2 puffs into the lungs  daily for 30 doses. 3 each 0 Past Week   tiZANidine (ZANAFLEX) 4 MG tablet Take 4 mg by mouth 3 (three) times daily.   Past Week   tretinoin (RETIN-A) 0.05 % cream Apply to face qhs, wash off qam 45 g 11 Past Week   OXYGEN Inhale 3-4 L into the lungs daily.      Pertinent Medications: PTA apixaban, last dose unknown  Last dose heparin sq 2/15 @0522 .   Assessment: 68 year old female with a past medical history significant for COPD on home supplemental oxygen and diabetes mellitus who presented to [facility] ED on 04/24/2023 due to unresponsiveness. PMH of afib on apixaban. CHADSVASc 3. Started on CRRT 2/15. Pharmacy was consulted to dose and manage this patient's heparin.   Date Time aPTT/HL Rate/Comment  2/15 1947 45/---  1200/SUBtherapeutic 2/16 0552 72/0.81 1500/aPTT therapeutic  2/16    1336     67/--                1500/aPTT therapeutic    2/16 2115 62/--  1500/aPTT SUBtherapeutic 2/17 0616 69/0.65 1650/both levels therapeutic x 1 2/17 1352 74/0.61 1650/both levels therapeutic x 2 2/18 0415 -- / 0.62 1650/therapeutic x 3  Baseline Labs:  Hgb 9.5 Plt 325 INR/PT 1.1/14.3  HL 0.67 - possibly has some false elevated due to apixaban.   Goal of Therapy:  Heparin level 0.3-0.7 units/ml aPTT 66-102 seconds Monitor platelets by anticoagulation protocol: Yes  Plan:  Continue heparin infusion at 1650 units/hr Recheck HL daily w/ AM labs while therapeutic Monitor CBC and HL daily while on heparin   Otelia Sergeant, PharmD, [facility] 04/29/2023 6:04 AM

## 2023-04-29 NOTE — Consult Note (Addendum)
 PHARMACY CONSULT NOTE - ELECTROLYTES  Pharmacy Consult for Electrolyte Monitoring and Replacement   Recent Labs:  Height: 5\' 8"  (172.7 cm) Weight: 117.5 kg (259 lb 0.7 oz) IBW/kg (Calculated) : 63.9 Estimated Creatinine Clearance: 54.6 mL/min (A) (by C-G formula based on SCr of 1.31 mg/dL (H)). Potassium (mmol/L)  Date Value  04/29/2023 4.4  11/16/2013 3.5   Magnesium (mg/dL)  Date Value  40/98/1191 2.3  11/16/2013 1.0 (L)   Calcium (mg/dL)  Date Value  47/82/9562 8.7 (L)   Calcium, Total (mg/dL)  Date Value  13/10/6576 9.1   Albumin (g/dL)  Date Value  46/96/2952 2.3 (L)  11/16/2013 3.8   Phosphorus (mg/dL)  Date Value  84/13/2440 2.7   Sodium (mmol/L)  Date Value  04/29/2023 131 (L)  11/16/2013 133 (L)    Assessment  Sarah Norton is a 68 y.o. female presenting with respiratory failure and unresponsive. PMH significant for DM and COPD. Due to high BG started an insulin gtt to target POC 140 - 180. Patient Pharmacy has been consulted to monitor and replace electrolytes. Now on CRRT.   Diet: npo Pertinent medications: free water 30 ml q4H  Goal of Therapy: Electrolytes WNL Today, 04/29/2023 K = 4.4 Mg = 2.3 Phos = 2.7 Na = 131 down-trending; continue to monitor   Plan:  No replacement needed  Continue to monitor with renal function panel at 0500 and 1600 daily  Thank you for allowing pharmacy to be a part of this patient's care.  Effie Shy, PharmD Pharmacy Resident  04/29/2023 7:30 AM

## 2023-04-29 NOTE — Progress Notes (Signed)
 NAME:  NOVA SCHMUHL, MRN:  [id number], DOB:  Jan 25, 1956, LOS: 5 ADMISSION DATE:  04/24/2023  History of Present Illness:   68 year old female with a past medical history significant for COPD on home supplemental oxygen and diabetes mellitus who presented to [facility] ED on 04/24/2023 due to unresponsiveness.  Upon EMS arrival she was noted to be hypoxic with O2 saturations in the 40s for which she was placed on CPAP with improvement in sats to the 80s.  Upon arrival to the ED she remained somnolent but slightly arousable, and given her hypoxia, she was transitioned to BiPAP.  Pt is currently intubated and sedated, and no other history is currently available.    ED Course: Initial Vital Signs: Temperature 96.5 F axillary, pulse 87, respiratory rate 33, blood pressure 136/97, SpO2 78% Significant Labs: Sodium 134, glucose 313, BUN 59, creatinine 1.27, BNP 436, high-sensitivity troponin 139, lactic acid 1.7, WBC 33.4, D-dimer 2.13 VBG: pH 7.19/pCO2 82/pO2 39/bicarb 31.3 Positive for influenza A Urinalysis negative for UTI Imaging Chest X-ray>>IMPRESSION: *Bibasilar heterogeneous opacities, favored to represent multilobar pneumonia. Follow-up to clearing is recommended. CTa Chest>> pending currently Medications Administered: 1 L normal saline bolus, 125 mg Solu-Medrol, IV azithromycin and ceftriaxone, Levophed drip initiated   Despite BiPAP patient's ABG worsened with worsening mental status.  Therefore ED provider elected to intubate.  PCCM is asked to admit for further workup and treatment.   Please see "significant hospital events" section below for full detailed hospital course.  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   Significant Hospital Events: Including procedures, antibiotic start and stop dates in addition to other pertinent events   04/24/2023 - Admitted to the ICU intubated and MV.  04/25/2023 - Patient with improving  respiratory status however with worsening wbc and procalcitonin.  04/26/2023 - HD cath placed and started on CRRT. 2/17 remains on vent, on CRRT   Interim History / Subjective:  Remains critically ill Remains intubated Severe hypoxia Requires VENT support for survival   Vent Mode: PRVC FiO2 (%):  [40 %-45 %] 40 % Set Rate:  [22 bmp] 22 bmp Vt Set:  [540 mL] 540 mL PEEP:  [8 cmH20-10 cmH20] 8 cmH20 Plateau Pressure:  [20 cmH20-22 cmH20] 22 cmH20    Objective   Blood pressure (!) 130/59, pulse 71, temperature 98.4 F (36.9 C), resp. rate (!) 22, height 5\' 8"  (1.727 m), weight 117.5 kg, SpO2 96%. CVP:  [5 mmHg-13 mmHg] 7 mmHg  Vent Mode: PRVC FiO2 (%):  [40 %-45 %] 40 % Set Rate:  [22 bmp] 22 bmp Vt Set:  [540 mL] 540 mL PEEP:  [8 cmH20-10 cmH20] 8 cmH20 Plateau Pressure:  [20 cmH20-22 cmH20] 22 cmH20   Intake/Output Summary (Last 24 hours) at 04/29/2023 0734 Last data filed at 04/29/2023 0700 Gross per 24 hour  Intake 3639.04 ml  Output 4659 ml  Net -1019.96 ml   Filed Weights   04/27/23 0437 04/28/23 0500 04/29/23 0320  Weight: 117.4 kg 117.9 kg 117.5 kg       REVIEW OF SYSTEMS  PATIENT IS UNABLE TO PROVIDE COMPLETE REVIEW OF SYSTEMS DUE TO SEVERE CRITICAL ILLNESS   PHYSICAL EXAMINATION:  GENERAL:critically ill appearing, +resp distress EYES: Pupils equal, round, reactive to light.  No scleral icterus.  MOUTH: Moist mucosal membrane. INTUBATED NECK: Supple.  PULMONARY: Lungs clear to auscultation, +rhonchi, +wheezing CARDIOVASCULAR: S1 and S2.  Regular rate and rhythm GASTROINTESTINAL: Soft, nontender, -distended. Positive bowel  sounds.  MUSCULOSKELETAL: No swelling, clubbing, or edema.  NEUROLOGIC: obtunded,sedated SKIN:normal, warm to touch, Capillary refill delayed  Pulses present bilaterally  Assessment & Plan:  68 year old female patient with past medical history of COPD with chronic hypoxic respiratory failure on 2 L nasal cannula presenting to [facility]  with acute on chronic hypoxic hypercapnic respiratory failure requiring intubation and mechanical ventilation on 02/13 secondary to ARDS, influenza type A with possible superimposed bacterial pneumonia, SEPTIC SHOCK WITH SEVERE COPD EXACERBATION   Severe ACUTE Hypoxic and Hypercapnic Respiratory Failure -continue Mechanical Ventilator support -Wean Fio2 and PEEP as tolerated -VAP/VENT bundle implementation - Wean PEEP & FiO2 as tolerated, maintain SpO2 > 88% - Head of bed elevated 30 degrees, VAP protocol in place - Plateau pressures less than 30 cm H20  - Intermittent chest x-ray & ABG PRN - Ensure adequate pulmonary hygiene  -will perform SAT/SBT when respiratory parameters are met  SEPTIC shock SOURCE-pnuemonia -use vasopressors to keep MAP>65 as needed -follow ABG and LA as needed -follow up cultures -emperic ABX  SEVERE COPD EXACERBATION -continue IV steroids as prescribed -continue NEB THERAPY as prescribed  ACUTE KIDNEY INJURY/Renal Failure REMAINS ON CRRT -continue Foley Catheter-assess need -Avoid nephrotoxic agents -Follow urine output, BMP -Ensure adequate renal perfusion, optimize oxygenation -Renal dose medications   Intake/Output Summary (Last 24 hours) at 04/29/2023 0735 Last data filed at 04/29/2023 0700 Gross per 24 hour  Intake 3639.04 ml  Output 4659 ml  Net -1019.96 ml      CARDIAC AFIb with RVR -follow up cardiac enzymes as indicated Demand Ischemia Continue heparin infusion   NEUROLOGY ACUTE METABOLIC ENCEPHALOPATHY -need for sedation -Goal RASS -2 to -3   ENDO - ICU hypoglycemic\Hyperglycemia protocol -check FSBS per protocol   GI GI PROPHYLAXIS as indicated NUTRITIONAL STATUS DIET-->TF's as tolerated Constipation protocol as indicated   ELECTROLYTES -follow labs as needed -replace as needed -pharmacy consultation and following  RESTRICTIVE TRANSFUSION PROTOCOL TRANSFUSION  IF HGB<7  or ACTIVE BLEEDING OR DX of ACUTE  CORONARY SYNDROMES       Best Practice (right click and "Reselect all SmartList Selections" daily)   Diet/type: tubefeeds DVT prophylaxis systemic heparin  Pressure ulcer(s): N/A GI prophylaxis: PPI Lines: Central line Foley:  Yes, and it is still needed Code Status:  full code       DVT/GI PRX  assessed I Assessed the need for Labs I Assessed the need for Foley I Assessed the need for Central Venous Line Family Discussion when available I Assessed the need for Mobilization I made an Assessment of medications to be adjusted accordingly Safety Risk assessment completed  CASE DISCUSSED IN MULTIDISCIPLINARY ROUNDS WITH ICU TEAM     Critical Care Time devoted to patient care services described in this note is 45 minutes.  Critical care was necessary to treat /prevent imminent and life-threatening deterioration. Overall, patient is critically ill, prognosis is guarded.  Patient with Multiorgan failure and at high risk for cardiac arrest and death.    Lucie Leather, M.D.  [facility]  Medical Director [facility] Medical Director [facility] Cardio-Pulmonary Department      [phone]

## 2023-04-29 NOTE — Progress Notes (Signed)
 [facility] [place], [place] 04/29/23  Subjective:   Hospital day # 5 Patient remains critically ill intubated and sedated. Heparin drip cvs: Off pressors today pulm: Ventilator assisted.  FiO2 40%/PEEP 8 gi: Tube feeds at 50 cc/h via OG tube.  Rectal tube in place Renal: Continued on CRRT  Urine output has improved significantly since this morning. 02/17 0701 - 02/18 0700 In: 3639 [I.V.:1861.5; NG/GT:1077.5; IV Piggyback:700.1] Out: 4659 [Urine:275; Emesis/NG output:15; Stool:200] Lab Results  Component Value Date   CREATININE 1.31 (H) 04/29/2023   CREATININE 1.73 (H) 04/28/2023   CREATININE 2.43 (H) 04/28/2023     Objective:  Vital signs in last 24 hours:  Temp:  [98.1 F (36.7 C)-98.8 F (37.1 C)] 98.8 F (37.1 C) (02/18 1430) Pulse Rate:  [39-148] 98 (02/18 1430) Resp:  [13-28] 27 (02/18 1430) BP: (117-173)/(48-99) 138/57 (02/18 1430) SpO2:  [90 %-100 %] 94 % (02/18 1430) Arterial Line BP: (121-211)/(44-77) 202/74 (02/18 1300) FiO2 (%):  [40 %] 40 % (02/18 1122) Weight:  [117.5 kg] 117.5 kg (02/18 0320)  Weight change: -0.4 kg Filed Weights   04/27/23 0437 04/28/23 0500 04/29/23 0320  Weight: 117.4 kg 117.9 kg 117.5 kg    Intake/Output:    Intake/Output Summary (Last 24 hours) at 04/29/2023 1449 Last data filed at 04/29/2023 1400 Gross per 24 hour  Intake 2328.6 ml  Output 4143 ml  Net -1814.4 ml     Physical Exam: General Appearance-critically ill-appearing female, laying in the bed HEENT-ET tube in place, OG tube in place Pulmonary-ventilator assisted.  Clear to auscultation bilaterally Cardiac-regular rhythm noted on telemetry Abdomen-nondistended Extremities-SCDs in place, trace edema Skin-no acute rashes noted Neuro-sedated Right IJ temporary dialysis catheter  Foley in place Rectal tube in place  Basic Metabolic Panel:  Recent Labs  Lab 04/25/23 1443 04/25/23 1848 04/26/23 0731 04/26/23 1824 04/27/23 0424  04/27/23 1551 04/28/23 0309 04/28/23 1551 04/29/23 0415  NA 136   < >  --    < > 134* 134* 134* 133* 131*  K 3.5   < >  --    < > 4.2 4.5 4.8 5.0 4.4  CL 98   < >  --    < > 99 100 101 102 98  CO2 24   < >  --    < > 21* 23 23 25 24   GLUCOSE 171*   < >  --    < > 168* 258* 249* 235* 235*  BUN 69*   < >  --    < > 72* 52* 48* 44* 38*  CREATININE 2.82*   < >  --    < > 3.86* 2.87* 2.43* 1.73* 1.31*  CALCIUM 8.0*   < >  --    < > 7.8* 8.0* 7.8* 7.7* 8.7*  MG 1.9  --  2.0  --  2.2  --  2.4  --  2.3  PHOS 2.6   < >  --   --  3.0 3.0 2.9 2.7 2.7   < > = values in this interval not displayed.     CBC: Recent Labs  Lab 04/24/23 1240 04/25/23 0411 04/26/23 0357 04/27/23 0424 04/28/23 0309 04/29/23 0415  WBC 33.4* 43.9* 46.7* 41.5* 31.3* 26.2*  NEUTROABS 28.0*  --   --   --   --   --   HGB 12.7 10.6* 9.8* 9.5* 10.0* 9.7*  HCT 38.1 32.1* 29.1* 28.2* 30.3* 28.5*  MCV 91.4 93.3 92.1 89.5 91.3 89.3  PLT 309 342 332 325 320 280     No results found for: "HEPBSAG", "HEPBSAB", "HEPBIGM"    Microbiology:  Recent Results (from the past 240 hours)  Culture, blood (Routine x 2)     Status: None   Collection Time: 04/24/23 12:40 PM   Specimen: BLOOD  Result Value Ref Range Status   Specimen Description   Final    BLOOD RIGHT ARM Performed at [facility], [address], [place], [place] [address]    Special Requests   Final    BOTTLES DRAWN AEROBIC AND ANAEROBIC Blood Culture adequate volume Performed at [facility], [address] [place], [place] [address]    Culture   Final    NO GROWTH 5 DAYS Performed at [facility], [address] [place], [place] [address]    Report Status 04/29/2023 FINAL  Final  Culture, blood (Routine x 2)     Status: None   Collection Time: 04/24/23 12:42 PM   Specimen: BLOOD  Result Value Ref Range Status   Specimen Description   Final    BLOOD LEFT ANTECUBITAL Performed at [facility], [address], [place], [place] [address]    Special Requests   Final    BOTTLES DRAWN AEROBIC AND ANAEROBIC Blood Culture adequate volume Performed at [facility], [address] [place], [place] [address]    Culture   Final    NO GROWTH 5 DAYS Performed at [facility], [address], [place], [place] [address]    Report Status 04/29/2023 FINAL  Final  Resp panel by RT-PCR (RSV, Flu A&B, Covid) Anterior Nasal Swab     Status: Abnormal   Collection Time: 04/24/23 12:42 PM   Specimen: Anterior Nasal Swab  Result Value Ref Range Status   SARS Coronavirus 2 by RT PCR NEGATIVE NEGATIVE Final    Comment: (NOTE) SARS-CoV-2 target nucleic acids are NOT DETECTED.  The SARS-CoV-2 RNA is generally detectable in upper respiratory specimens during the acute phase of infection. The lowest concentration of SARS-CoV-2 viral copies this assay can detect is 138 copies/mL. A negative result does not preclude SARS-Cov-2 infection and should not be used as the sole basis for treatment or other patient management decisions. A negative result may occur with  improper specimen collection/handling, submission of specimen other than nasopharyngeal swab, presence of viral mutation(s) within the areas targeted by this assay, and inadequate number of viral copies(<138 copies/mL). A negative result must be combined with clinical observations, patient history, and epidemiological information. The expected result is Negative.  Fact Sheet for Patients:  BloggerCourse.com  Fact Sheet for Healthcare Providers:  SeriousBroker.it  This test is no t yet approved or cleared by the [place] FDA and  has been authorized for detection and/or diagnosis of SARS-CoV-2 by FDA under an Emergency Use Authorization (EUA). This EUA will remain  in effect (meaning this test can be used) for the duration of the COVID-19 declaration under Section 564(b)(1) of the Act,  21 U.S.C.section 360bbb-3(b)(1), unless the authorization is terminated  or revoked sooner.       Influenza A by PCR POSITIVE (A) NEGATIVE Final   Influenza B by PCR NEGATIVE NEGATIVE Final    Comment: (NOTE) The Xpert Xpress SARS-CoV-2/FLU/RSV plus assay is intended as an aid in the diagnosis of influenza from Nasopharyngeal swab specimens and should not be used as a sole basis for treatment. Nasal washings and aspirates are unacceptable for Xpert Xpress  SARS-CoV-2/FLU/RSV testing.  Fact Sheet for Patients: BloggerCourse.com  Fact Sheet for Healthcare Providers: SeriousBroker.it  This test is not yet approved or cleared by the [place] FDA and has been authorized for detection and/or diagnosis of SARS-CoV-2 by FDA under an Emergency Use Authorization (EUA). This EUA will remain in effect (meaning this test can be used) for the duration of the COVID-19 declaration under Section 564(b)(1) of the Act, 21 U.S.C. section 360bbb-3(b)(1), unless the authorization is terminated or revoked.     Resp Syncytial Virus by PCR NEGATIVE NEGATIVE Final    Comment: (NOTE) Fact Sheet for Patients: BloggerCourse.com  Fact Sheet for Healthcare Providers: SeriousBroker.it  This test is not yet approved or cleared by the [place] FDA and has been authorized for detection and/or diagnosis of SARS-CoV-2 by FDA under an Emergency Use Authorization (EUA). This EUA will remain in effect (meaning this test can be used) for the duration of the COVID-19 declaration under Section 564(b)(1) of the Act, 21 U.S.C. section 360bbb-3(b)(1), unless the authorization is terminated or revoked.  Performed at [facility], [address] [place], [place] [address]   MRSA Next Gen by PCR, Nasal     Status: None   Collection Time: 04/24/23  9:52 PM   Specimen: Nasal Mucosa; Nasal Swab   Result Value Ref Range Status   MRSA by PCR Next Gen NOT DETECTED NOT DETECTED Final    Comment: (NOTE) The GeneXpert MRSA Assay (FDA approved for NASAL specimens only), is one component of a comprehensive MRSA colonization surveillance program. It is not intended to diagnose MRSA infection nor to guide or monitor treatment for MRSA infections. Test performance is not FDA approved in patients less than 68 years old. Performed at [facility], [address] [place], [place] [address]   Respiratory (~20 pathogens) panel by PCR     Status: Abnormal   Collection Time: 04/25/23  1:27 PM   Specimen: Nasopharyngeal Swab; Respiratory  Result Value Ref Range Status   Adenovirus NOT DETECTED NOT DETECTED Final   Coronavirus 229E NOT DETECTED NOT DETECTED Final    Comment: (NOTE) The Coronavirus on the Respiratory Panel, DOES NOT test for the novel  Coronavirus (2019 nCoV)    Coronavirus HKU1 NOT DETECTED NOT DETECTED Final   Coronavirus NL63 NOT DETECTED NOT DETECTED Final   Coronavirus OC43 NOT DETECTED NOT DETECTED Final   Metapneumovirus NOT DETECTED NOT DETECTED Final   Rhinovirus / Enterovirus NOT DETECTED NOT DETECTED Final   Influenza A H3 DETECTED (A) NOT DETECTED Final   Influenza B NOT DETECTED NOT DETECTED Final   Parainfluenza Virus 1 NOT DETECTED NOT DETECTED Final   Parainfluenza Virus 2 NOT DETECTED NOT DETECTED Final   Parainfluenza Virus 3 NOT DETECTED NOT DETECTED Final   Parainfluenza Virus 4 NOT DETECTED NOT DETECTED Final   Respiratory Syncytial Virus NOT DETECTED NOT DETECTED Final   Bordetella pertussis NOT DETECTED NOT DETECTED Final   Bordetella Parapertussis NOT DETECTED NOT DETECTED Final   Chlamydophila pneumoniae NOT DETECTED NOT DETECTED Final   Mycoplasma pneumoniae NOT DETECTED NOT DETECTED Final    Comment: Performed at [facility], [address] [place], [place] [address]  Culture, Respiratory w Gram Stain     Status: None    Collection Time: 04/26/23  3:45 PM   Specimen: Tracheal Aspirate  Result Value Ref Range Status   Specimen Description   Final    TRACHEAL ASPIRATE Performed at [facility], [address] [place], [place] [address]    Special Requests   Final    NONE Performed at [facility], [address] [place], [place] [address]    Gram Stain   Final    RARE SQUAMOUS EPITHELIAL CELLS PRESENT WBC PRESENT,BOTH PMN AND MONONUCLEAR RARE GRAM POSITIVE COCCI    Culture   Final    NO GROWTH 2 DAYS Performed at [facility], [address], [place], [place] [address]    Report Status 04/29/2023 FINAL  Final    Coagulation Studies: No results for input(s): "LABPROT", "INR" in the last 72 hours.  Urinalysis: No results for input(s): "COLORURINE", "LABSPEC", "PHURINE", "GLUCOSEU", "HGBUR", "BILIRUBINUR", "KETONESUR", "PROTEINUR", "UROBILINOGEN", "NITRITE", "LEUKOCYTESUR" in the last 72 hours.  Invalid input(s): "APPERANCEUR"    Imaging: No results found.    Medications:    ceFEPime (MAXIPIME) IV 2 g (04/29/23 0936)   DOPamine Stopped (04/28/23 1819)   feeding supplement (VITAL HIGH PROTEIN) 50 mL/hr at 04/29/23 0600   fentaNYL infusion INTRAVENOUS 170 mcg/hr (04/29/23 1252)   heparin 1,650 Units/hr (04/29/23 0911)   norepinephrine (LEVOPHED) Adult infusion Stopped (04/29/23 0710)   PrismaSol BGK 2/3.5 400 mL/hr at 04/29/23 0630   PrismaSol BGK 2/3.5 400 mL/hr at 04/29/23 0630   PrismaSol BGK 2/3.5 1,500 mL/hr (04/29/23 1405)   propofol (DIPRIVAN) infusion 45 mcg/kg/min (04/29/23 1242)   vasopressin Stopped (04/29/23 0133)    Chlorhexidine Gluconate Cloth  6 each Topical Daily   docusate  100 mg Per Tube BID   feeding supplement (PROSource TF20)  60 mL Per Tube Daily   free water  30 mL Per Tube Q4H   [START ON 04/30/2023] hydrocortisone sod succinate (SOLU-CORTEF) inj  20 mg Intravenous Daily   insulin aspart  0-20 Units Subcutaneous Q4H   insulin  glargine-yfgn  15 Units Subcutaneous BID   ipratropium-albuterol  3 mL Nebulization Q4H   levothyroxine  175 mcg Per Tube Q0600   linezolid  600 mg Per Tube Q12H   multivitamin  1 tablet Per Tube QHS   multivitamin with minerals  1 tablet Per Tube Daily   nutrition supplement (JUVEN)  1 packet Per Tube BID BM   mouth rinse  15 mL Mouth Rinse Q2H   pantoprazole (PROTONIX) IV  40 mg Intravenous QHS   polyethylene glycol  17 g Per Tube Daily   sodium chloride flush  10-40 mL Intracatheter Q12H   thiamine  100 mg Per Tube Daily   acetaminophen, docusate sodium, fentaNYL, fentaNYL (SUBLIMAZE) injection, fentaNYL (SUBLIMAZE) injection, heparin, midazolam, mouth rinse, polyethylene glycol, sodium chloride flush  Assessment/ Plan:  68 y.o. female with  medical problems of  COPD, home oxygen, diabetes mellitus, coronary disease, hypertension  admitted on 04/24/2023 for COPD exacerbation (HCC) [J44.1] Acute respiratory failure with hypoxia and hypercarbia (HCC) [J96.01, J96.02] Acute respiratory failure with hypoxia and hypercapnia (HCC) [J96.01, J96.02] Community acquired pneumonia of right lower lobe of lung [J18.9]  Acute kidney injury on chronic kidney disease stage IIIa.  IV contrast nephropathy Baseline creatinine 1 7/GFR 46 on 04/24/2023 (admission)  Acute kidney injury likely secondary to ATN from concurrent illness/sepsis and IV contrast exposure on 04/24/2023. Urinalysis-shows proteinuria of 100 mg, 0-5 WBCs, 0-5 RBCs. Urine output seems to be improving today. Will discontinue CRRT at the end of the day. Monitor renal function and evaluate for further need of intermittent hemodialysis on a daily basis.   Pneumonia, sepsis, hypotension Influenza A positive Currently requiring ventilator support Antibiotic regimen includes cefepime, Tamiflu, Zyvox.  Management as per ICU team.     LOS: 5 Madysin Crisp 2/18/20252:49 PM  [facility] [place],  [place] [phone]  Note: This note was prepared with Dragon dictation. Any transcription errors are unintentional

## 2023-04-30 ENCOUNTER — Inpatient Hospital Stay: Payer: 59

## 2023-04-30 DIAGNOSIS — R6521 Severe sepsis with septic shock: Secondary | ICD-10-CM | POA: Diagnosis not present

## 2023-04-30 DIAGNOSIS — J9602 Acute respiratory failure with hypercapnia: Secondary | ICD-10-CM | POA: Diagnosis not present

## 2023-04-30 DIAGNOSIS — A419 Sepsis, unspecified organism: Secondary | ICD-10-CM | POA: Diagnosis not present

## 2023-04-30 DIAGNOSIS — J9601 Acute respiratory failure with hypoxia: Secondary | ICD-10-CM | POA: Diagnosis not present

## 2023-04-30 LAB — RENAL FUNCTION PANEL
Albumin: 2.2 g/dL — ABNORMAL LOW (ref 3.5–5.0)
Anion gap: 9 (ref 5–15)
BUN: 51 mg/dL — ABNORMAL HIGH (ref 8–23)
CO2: 25 mmol/L (ref 22–32)
Calcium: 9.3 mg/dL (ref 8.9–10.3)
Chloride: 99 mmol/L (ref 98–111)
Creatinine, Ser: 1.45 mg/dL — ABNORMAL HIGH (ref 0.44–1.00)
GFR, Estimated: 39 mL/min — ABNORMAL LOW (ref >60)
Glucose, Bld: 155 mg/dL — ABNORMAL HIGH (ref 70–99)
Phosphorus: 2.5 mg/dL (ref 2.5–4.6)
Potassium: 3.6 mmol/L (ref 3.5–5.1)
Sodium: 133 mmol/L — ABNORMAL LOW (ref 135–145)

## 2023-04-30 LAB — CBC
HCT: 28.8 % — ABNORMAL LOW (ref 36.0–46.0)
Hemoglobin: 9.7 g/dL — ABNORMAL LOW (ref 12.0–15.0)
MCH: 30.4 pg (ref 26.0–34.0)
MCHC: 33.7 g/dL (ref 30.0–36.0)
MCV: 90.3 fL (ref 80.0–100.0)
Platelets: 302 10*3/uL (ref 150–400)
RBC: 3.19 MIL/uL — ABNORMAL LOW (ref 3.87–5.11)
RDW: 16.6 % — ABNORMAL HIGH (ref 11.5–15.5)
WBC: 29.4 10*3/uL — ABNORMAL HIGH (ref 4.0–10.5)
nRBC: 0.2 % (ref 0.0–0.2)

## 2023-04-30 LAB — GLUCOSE, CAPILLARY
Glucose-Capillary: 210 mg/dL — ABNORMAL HIGH (ref 70–99)
Glucose-Capillary: 147 mg/dL — ABNORMAL HIGH (ref 70–99)
Glucose-Capillary: 120 mg/dL — ABNORMAL HIGH (ref 70–99)
Glucose-Capillary: 156 mg/dL — ABNORMAL HIGH (ref 70–99)
Glucose-Capillary: 174 mg/dL — ABNORMAL HIGH (ref 70–99)
Glucose-Capillary: 193 mg/dL — ABNORMAL HIGH (ref 70–99)
Glucose-Capillary: 164 mg/dL — ABNORMAL HIGH (ref 70–99)

## 2023-04-30 LAB — MAGNESIUM: Magnesium: 2 mg/dL (ref 1.7–2.4)

## 2023-04-30 LAB — HEPARIN LEVEL (UNFRACTIONATED): Heparin Unfractionated: 0.42 [IU]/mL (ref 0.30–0.70)

## 2023-04-30 MED ORDER — DEXMEDETOMIDINE HCL IN NACL 400 MCG/100ML IV SOLN
0.0000 ug/kg/h | INTRAVENOUS | Status: DC
  Administered 2023-04-30: 1 ug/kg/h via INTRAVENOUS

## 2023-04-30 NOTE — Progress Notes (Signed)
 NAME:  Sarah Norton, MRN:  [id number], DOB:  04/09/1955, LOS: 6 ADMISSION DATE:  04/24/2023, CONSULTATION DATE:  04/24/23 REFERRING MD:  Dr. Rosalia Hammers, CHIEF COMPLAINT:  Acute Respiratory Distress, hypoxia, AMS   Brief Pt Description / Synopsis:  68 y.o female admitted with Acute Metabolic Encephalopathy and Acute Hypoxic and Hypercapnic Respiratory Failure in the setting of Acute COPD Exacerbation due to Influenza A infection and questionable superimposed bacterial pneumonia, failed trial of BiPAP requiring intubation and mechanical ventilation.  Course complicated by AKI requiring initiation of CRRT.  History of Present Illness:  Sarah Norton is a 68 year old female with a past medical history significant for COPD on home supplemental oxygen and diabetes mellitus who presented to [facility] ED on 04/24/2023 due to unresponsiveness.  Upon EMS arrival she was noted to be hypoxic with O2 saturations in the 40s for which she was placed on CPAP with improvement in sats to the 80s.  Upon arrival to the ED she remained somnolent but slightly arousable, and given her hypoxia, she was transitioned to BiPAP.  Pt is currently intubated and sedated, and no other history is currently available.   ED Course: Initial Vital Signs: Temperature 96.5 F axillary, pulse 87, respiratory rate 33, blood pressure 136/97, SpO2 78% Significant Labs: Sodium 134, glucose 313, BUN 59, creatinine 1.27, BNP 436, high-sensitivity troponin 139, lactic acid 1.7, WBC 33.4, D-dimer 2.13 VBG: pH 7.19/pCO2 82/pO2 39/bicarb 31.3 Positive for influenza A Urinalysis negative for UTI Imaging Chest X-ray>>IMPRESSION: *Bibasilar heterogeneous opacities, favored to represent multilobar pneumonia. Follow-up to clearing is recommended. CTa Chest>> pending currently Medications Administered: 1 L normal saline bolus, 125 mg Solu-Medrol, IV azithromycin and ceftriaxone, Levophed drip initiated  Despite BiPAP patient's ABG worsened with worsening mental  status.  Therefore ED provider elected to intubate.  PCCM is asked to admit for further workup and treatment.  Please see "significant hospital events" section below for full detailed hospital course.   Pertinent  Medical History   Past Medical History:  Diagnosis Date   Arthritis    Asthma    Cataract of both eyes    Complication of anesthesia    woke up during surgery x1   COPD (chronic obstructive pulmonary disease) (HCC)    Coronary artery disease    DDD (degenerative disc disease), lumbar    Diabetes mellitus without complication (HCC)    Dyspnea    DUE TO COPD   Hernia of abdominal wall    History of hiatal hernia    small   Hyperlipidemia    Hypertension    Hypothyroidism    hashimotos throiditis  -thyroidectomy  2000   Occasional tremors    Oxygen desaturation    NOW PT ON 3-4 LITERS [place] DAILY    Micro Data:  2/13: COVID/FLU/RSV PCR>> + Influenza A 2/13: Blood culture x2>> no growth 2/13: Strep pneumo urinary antigen>> negative 2/13: Legionella urinary antigen>> negative 2/13: HIV screen>>negative 2/13: MRSA PCR>> negative 2/15: Tracheal aspirate>> no growth  Antimicrobials:   Anti-infectives (From admission, onward)    Start     Dose/Rate Route Frequency Ordered Stop   04/30/23 2200  ceFEPIme (MAXIPIME) 1 g in sodium chloride 0.9 % 100 mL IVPB        1 g 200 mL/hr over 30 Minutes Intravenous Every 24 hours 04/29/23 1842     04/29/23 1145  linezolid (ZYVOX) tablet 600 mg        600 mg Per Tube Every 12 hours 04/29/23 1052  04/28/23 1015  vancomycin (VANCOREADY) IVPB 1250 mg/250 mL        1,250 mg 166.7 mL/hr over 90 Minutes Intravenous  Once 04/28/23 0921 04/28/23 1209   04/28/23 1000  oseltamivir (TAMIFLU) 6 MG/ML suspension 30 mg  Status:  Discontinued        30 mg Per Tube Daily 04/28/23 0737 04/28/23 0747   04/28/23 1000  oseltamivir (TAMIFLU) 6 MG/ML suspension 75 mg        75 mg Per Tube Daily 04/28/23 0747 04/29/23 1155   04/27/23 1000   oseltamivir (TAMIFLU) 6 MG/ML suspension 75 mg  Status:  Discontinued        75 mg Per Tube Daily 04/26/23 1834 04/28/23 0737   04/27/23 0815  vancomycin (VANCOCIN) IVPB 1000 mg/200 mL premix        1,000 mg 200 mL/hr over 60 Minutes Intravenous  Once 04/27/23 0724 04/27/23 0921   04/26/23 2200  ceFEPIme (MAXIPIME) 2 g in sodium chloride 0.9 % 100 mL IVPB  Status:  Discontinued        2 g 200 mL/hr over 30 Minutes Intravenous Every 12 hours 04/26/23 1834 04/29/23 1842   04/26/23 1000  oseltamivir (TAMIFLU) 6 MG/ML suspension 30 mg  Status:  Discontinued        30 mg Per Tube Daily 04/25/23 1017 04/26/23 1834   04/25/23 2000  vancomycin (VANCOREADY) IVPB 1250 mg/250 mL  Status:  Discontinued        1,250 mg 166.7 mL/hr over 90 Minutes Intravenous Every 24 hours 04/24/23 2106 04/25/23 1151   04/25/23 1300  ceFEPIme (MAXIPIME) 2 g in sodium chloride 0.9 % 100 mL IVPB  Status:  Discontinued        2 g 200 mL/hr over 30 Minutes Intravenous Every 24 hours 04/25/23 1157 04/26/23 1834   04/25/23 1200  azithromycin (ZITHROMAX) 500 mg in sodium chloride 0.9 % 250 mL IVPB        500 mg 250 mL/hr over 60 Minutes Intravenous Every 24 hours 04/24/23 1557 04/28/23 1312   04/25/23 1150  vancomycin variable dose per unstable renal function (pharmacist dosing)  Status:  Discontinued         Does not apply See admin instructions 04/25/23 1151 04/29/23 1052   04/25/23 1100  cefTRIAXone (ROCEPHIN) 2 g in sodium chloride 0.9 % 100 mL IVPB  Status:  Discontinued        2 g 200 mL/hr over 30 Minutes Intravenous Every 24 hours 04/24/23 1557 04/25/23 1021   04/24/23 2200  oseltamivir (TAMIFLU) 6 MG/ML suspension 30 mg  Status:  Discontinued        30 mg Per Tube 2 times daily 04/24/23 1559 04/25/23 1017   04/24/23 2045  vancomycin (VANCOREADY) IVPB 2000 mg/400 mL        2,000 mg 200 mL/hr over 120 Minutes Intravenous  Once 04/24/23 2034 04/25/23 0045   04/24/23 1245  cefTRIAXone (ROCEPHIN) 2 g in sodium chloride  0.9 % 100 mL IVPB        2 g 200 mL/hr over 30 Minutes Intravenous Once 04/24/23 1244 04/24/23 1407   04/24/23 1245  azithromycin (ZITHROMAX) 500 mg in sodium chloride 0.9 % 250 mL IVPB        500 mg 250 mL/hr over 60 Minutes Intravenous  Once 04/24/23 1244 04/24/23 1513       Significant Hospital Events: Including procedures, antibiotic start and stop dates in addition to other pertinent events   2/13: Presented to ED with  AMS, acute respiratory distress and hypoxia.  Initially trialed on BiPAP but with worsening ABG and mental status.  ED provider intubated.  PCCM asked to admit. 04/25/2023 - Patient with improving respiratory status however with worsening wbc and procalcitonin.  04/26/2023 - HD cath placed and started on CRRT. 2/17 remains on vent, on CRRT 02/18: CRRT discontinued  02/19: On minimal vent settings.  Plan for WUA/SBT as tolerated utilizing Precedex.  With SBT, increased RR/WOB/accessory muscle use.  Adequate urine output and electrolytes acceptable, no plan for HD today.  Interim History / Subjective:  As outlined above in significant hospital events section  Objective   Blood pressure (!) 97/47, pulse 75, temperature 98.8 F (37.1 C), resp. rate (!) 22, height 5\' 8"  (1.727 m), weight 114.8 kg, SpO2 98%. CVP:  [0 mmHg-43 mmHg] 10 mmHg  Vent Mode: PRVC FiO2 (%):  [35 %-40 %] 35 % Set Rate:  [22 bmp] 22 bmp Vt Set:  [540 mL] 540 mL PEEP:  [8 cmH20] 8 cmH20 Plateau Pressure:  [18 cmH20-22 cmH20] 18 cmH20   Intake/Output Summary (Last 24 hours) at 04/30/2023 0731 Last data filed at 04/30/2023 0400 Gross per 24 hour  Intake 3416.42 ml  Output 2515 ml  Net 901.42 ml   Filed Weights   04/28/23 0500 04/29/23 0320 04/30/23 0310  Weight: 117.9 kg 117.5 kg 114.8 kg    Examination: General: Critically ill-appearing female, laying in bed, intubated and sedated, no acute distress HENT: Atraumatic, normocephalic, neck supple, difficult to assess JVD due to body habitus  and bilateral internal jugular central line/HD catheters.  orally intubated Lungs: Coarse throughout, even, nonlabored, overbreathing the vent Cardiovascular: Irregularly irregular rhythm, rate controlled, no murmurs, rubs, gallops Abdomen: Obese, soft, nontender, nondistended, no guarding or rebound tenderness, bowel sounds positive x 4 Extremities: Normal bulk and tone, no deformities, trace edema to bilateral lower extremities Neuro: Sedated, currently not following commands or withdrawing from pain, pupils PERRLA at 3 mm bilaterally GU: Foley catheter in place  Resolved Hospital Problem list     Assessment & Plan:   #Acute Hypoxic & Hypercapnic Respiratory Failure in the setting of AECOPD, Influenza infection, and questionable superimposed bacterial pneumonia CTa Chest on 2/13: negative for PE, AAA or dissection. Concerning for interstitial and alveolar edema vs pneumonitis, and bibasilar consolidation (R >L). -Full vent support, implement lung protective strategies -Plateau pressures less than 30 cm H20 -Wean FiO2 & PEEP as tolerated to maintain O2 sats >92% -Follow intermittent Chest X-ray & ABG as needed -Spontaneous Breathing Trials when respiratory parameters met and mental status permits -Implement VAP Bundle -Bronchodilators -IV Steroids -ABX as above -Completed course of Tamiflu -Volume removal with CRRT/HD  #Shock: Suspect Septic  #Elevated Troponin in setting of demand ischemia  #Atrial Fibrillation with RVR ~ RATE CONTROLLED Echocardiogram 04/25/23: LVEF 60-65%, mild LVH, normal diastolic parameters, RV systolic function is normal, moderately elevated pulmonary artery systolic pressure, mild to moderate AS -Continuous cardiac monitoring -Maintain MAP >65 -IV fluids -Vasopressors as needed to maintain MAP goal  -Lactic acid is normalized -HS Troponin peaked at 139 -Echocardiogram pending -Diuresis as BP and renal function permits ~ holding due to shock -Continue  Heparin gtt  #Meets SIRS Criteria (RR 34, WBC 33.4) #Severe Sepsis in setting of Influenza A infection and questionable superimposed bacterial pneumonia -Monitor fever curve -Trend WBC's -Follow cultures as above -Complete course of Cefepime and Linezolid on 2/19  #Acute Kidney Injury on CKD Stage IIIa #Mild Hyponatremia -Monitor I&O's / urinary output -Follow BMP -  Ensure adequate renal perfusion -Avoid nephrotoxic agents as able -Replace electrolytes as indicated ~ Pharmacy following for assistance with electrolyte replacement -Nephrology following, appreciate input ~ CRRT vs HD as per Nephrology  #Diabetes Mellitus -CBG's q4h; Target range of 140 to 180 -SSI -Follow ICU Hypo/Hyperglycemia protocol  #Acute Metabolic Encephalopathy, suspect CO2 Narcosis #Sedation needs in setting of mechanical ventilation CT Head on 2/13 negative for acute intracranial process -Treatment of metabolic derangements, sepsis, and hypercapnia as outlined above -Maintain a RASS goal of 0 to -1 -Fentanyl and Propofol as needed to maintain RASS goal -Avoid sedating medications as able -Daily wake up assessment ~ will utilized Precedex for WUA     Patient is critically ill with severe acute hypoxic and hypercapnic respiratory failure requiring mechanical ventilation.  Given her underlying COPD at baseline, suspect she will be difficult to liberate from mechanical ventilation.    Best Practice (right click and "Reselect all SmartList Selections" daily)   Diet/type: Tube feeds DVT prophylaxis: Heparin gtt GI prophylaxis: PPI Lines: Central line and HD catheter, both still needed Foley:  Yes, and it is still needed Code Status:  full code Last date of multidisciplinary goals of care discussion [2/19]  2/19: Will update Pt's daughter at bedside when she arrives on plan of care.  Labs   CBC: Recent Labs  Lab 04/24/23 1240 04/25/23 0411 04/26/23 0357 04/27/23 0424 04/28/23 0309  04/29/23 0415 04/30/23 0406  WBC 33.4*   < > 46.7* 41.5* 31.3* 26.2* 29.4*  NEUTROABS 28.0*  --   --   --   --   --   --   HGB 12.7   < > 9.8* 9.5* 10.0* 9.7* 9.7*  HCT 38.1   < > 29.1* 28.2* 30.3* 28.5* 28.8*  MCV 91.4   < > 92.1 89.5 91.3 89.3 90.3  PLT 309   < > 332 325 320 280 302   < > = values in this interval not displayed.    Basic Metabolic Panel: Recent Labs  Lab 04/26/23 0731 04/26/23 1824 04/27/23 0424 04/27/23 1551 04/28/23 0309 04/28/23 1551 04/29/23 0415 04/29/23 1543 04/30/23 0406  NA  --    < > 134*   < > 134* 133* 131* 134* 133*  K  --    < > 4.2   < > 4.8 5.0 4.4 3.6 3.6  CL  --    < > 99   < > 101 102 98 101 99  CO2  --    < > 21*   < > 23 25 24 26 25   GLUCOSE  --    < > 168*   < > 249* 235* 235* 155* 155*  BUN  --    < > 72*   < > 48* 44* 38* 37* 51*  CREATININE  --    < > 3.86*   < > 2.43* 1.73* 1.31* 1.01* 1.45*  CALCIUM  --    < > 7.8*   < > 7.8* 7.7* 8.7* 8.8* 9.3  MG 2.0  --  2.2  --  2.4  --  2.3  --  2.0  PHOS  --   --  3.0   < > 2.9 2.7 2.7 2.3* 2.5   < > = values in this interval not displayed.   GFR: Estimated Creatinine Clearance: 48.7 mL/min (A) (by C-G formula based on SCr of 1.45 mg/dL (H)). Recent Labs  Lab 04/24/23 1240 04/24/23 1738 04/25/23 0411 04/26/23 0357 04/27/23 0424 04/28/23 0309 04/29/23  0415 04/30/23 0406  PROCALCITON  --  1.20 19.80 46.05  --   --   --   --   WBC 33.4*  --  43.9* 46.7* 41.5* 31.3* 26.2* 29.4*  LATICACIDVEN 1.7 1.2  --   --   --   --   --   --     Liver Function Tests: Recent Labs  Lab 04/24/23 1240 04/25/23 0411 04/26/23 0731 04/27/23 0424 04/28/23 0309 04/28/23 1551 04/29/23 0415 04/29/23 1543 04/30/23 0406  AST 40  --  26  --   --   --   --   --   --   ALT 33  --  20  --   --   --   --   --   --   ALKPHOS 94  --  75  --   --   --   --   --   --   BILITOT 0.7  --  1.2  --   --   --   --   --   --   PROT 7.8  --  6.2*  --   --   --   --   --   --   ALBUMIN 3.4*   < > 2.2*   < > 2.2*  2.1* 2.3* 2.0* 2.2*   < > = values in this interval not displayed.   No results for input(s): "LIPASE", "AMYLASE" in the last 168 hours. No results for input(s): "AMMONIA" in the last 168 hours.  ABG    Component Value Date/Time   PHART 7.38 04/29/2023 1202   PCO2ART 46 04/29/2023 1202   PO2ART 83 04/29/2023 1202   HCO3 27.2 04/29/2023 1202   ACIDBASEDEF 3.2 (H) 04/27/2023 1551   O2SAT 98.9 04/29/2023 1202     Coagulation Profile: Recent Labs  Lab 04/24/23 1240  INR 1.1    Cardiac Enzymes: No results for input(s): "CKTOTAL", "CKMB", "CKMBINDEX", "TROPONINI" in the last 168 hours.  HbA1C: Hemoglobin A1C  Date/Time Value Ref Range Status  11/16/2013 04:11 PM 6.5 (H) 4.2 - 6.3 % Final    Comment:    The American Diabetes Association recommends that a primary goal of therapy should be <7% and that physicians should reevaluate the treatment regimen in patients with HbA1c values consistently >8%.    Hgb A1c MFr Bld  Date/Time Value Ref Range Status  04/25/2023 04:11 AM 6.5 (H) 4.8 - 5.6 % Final    Comment:    (NOTE) Pre diabetes:          5.7%-6.4%  Diabetes:              >6.4%  Glycemic control for   <7.0% adults with diabetes   05/04/2020 04:33 AM 8.0 (H) 4.8 - 5.6 % Final    Comment:    (NOTE) Pre diabetes:          5.7%-6.4%  Diabetes:              >6.4%  Glycemic control for   <7.0% adults with diabetes     CBG: Recent Labs  Lab 04/29/23 1550 04/29/23 1918 04/29/23 2011 04/29/23 2310 04/30/23 0316  GLUCAP 151* 154* 165* 164* 147*    Review of Systems:   Unable to assess due to AMS/intubation/sedation   Past Medical History:  She,  has a past medical history of Arthritis, Asthma, Cataract of both eyes, Complication of anesthesia, COPD (chronic obstructive pulmonary disease) (HCC), Coronary artery disease, DDD (degenerative disc  disease), lumbar, Diabetes mellitus without complication (HCC), Dyspnea, Hernia of abdominal wall, History of hiatal  hernia, Hyperlipidemia, Hypertension, Hypothyroidism, Occasional tremors, and Oxygen desaturation.   Surgical History:   Past Surgical History:  Procedure Laterality Date   ABDOMINAL HYSTERECTOMY     CHOLECYSTECTOMY     COLONOSCOPY WITH PROPOFOL N/A 09/08/2014   Procedure: COLONOSCOPY WITH PROPOFOL;  Surgeon: Midge Minium, MD;  Location: [facility] ENDOSCOPY;  Service: Endoscopy;  Laterality: N/A;   GANGLION CYST EXCISION Right 08/15/2020   Procedure: Right index finger cyst removal;  Surgeon: Kennedy Bucker, MD;  Location: [facility] ORS;  Service: Orthopedics;  Laterality: Right;   HIATAL HERNIA REPAIR  2000   OTHER SURGICAL HISTORY     vocal cord surgery due to injury during thyroid surgery   parathyroidectomy  2000   THYROIDECTOMY     TONSILLECTOMY     TUMOR REMOVAL Right Leg     Social History:   reports that she quit smoking about 7 years ago. Her smoking use included cigarettes. She started smoking about 55 years ago. She has a 48 pack-year smoking history. She has never used smokeless tobacco. She reports that she does not drink alcohol and does not use drugs.   Family History:  Her family history includes Breast cancer in her sister; Kidney disease in her mother.   Allergies No Known Allergies   Home Medications  Prior to Admission medications   Medication Sig Start Date End Date Taking? Authorizing Provider  MOUNJARO 10 MG/0.5ML Pen Inject 10 mg into the skin once a week. 03/27/23  Yes [provider]  albuterol (VENTOLIN HFA) 108 (90 Base) MCG/ACT inhaler Inhale 2 puffs into the lungs every 4 (four) hours as needed for wheezing or shortness of breath.    [provider]  allopurinol (ZYLOPRIM) 100 MG tablet Take 100 mg by mouth every morning.    [provider]  amLODipine (NORVASC) 5 MG tablet Take 5 mg by mouth every morning. 07/23/20   [provider]  atorvastatin (LIPITOR) 40 MG tablet Take 40 mg by mouth every morning.    [provider]   benazepril (LOTENSIN) 40 MG tablet Take 40 mg by mouth every morning.    [provider]  chlorhexidine (PERIDEX) 0.12 % solution Use as directed 15 mLs in the mouth or throat as needed.    [provider]  ELIQUIS 5 MG TABS tablet Take 5 mg by mouth 2 (two) times daily.    [provider]  fluticasone (FLONASE) 50 MCG/ACT nasal spray Place 2 sprays into both nostrils daily as needed for allergies. 03/31/20   [provider]  fluticasone-salmeterol (ADVAIR HFA) 403-47 MCG/ACT inhaler Inhale 2 puffs by mouth twice daily 07/07/21   Erin Fulling, MD  furosemide (LASIX) 80 MG tablet Take 80 mg by mouth every morning.    [provider]  gabapentin (NEURONTIN) 600 MG tablet Take 600 mg by mouth 3 (three) times daily.    [provider]  hydrochlorothiazide (HYDRODIURIL) 25 MG tablet Take 25 mg by mouth every morning.    [provider]  HYDROcodone-acetaminophen (NORCO) 5-325 MG tablet Take 1 tablet by mouth every 6 (six) hours as needed for moderate pain. 08/15/20   Kennedy Bucker, MD  ibuprofen (ADVIL) 800 MG tablet Take 800 mg by mouth every 8 (eight) hours as needed for pain. 07/21/20   [provider]  indomethacin (INDOCIN) 25 MG capsule Take 25 mg by mouth daily.    [provider]  LANTUS SOLOSTAR 100 UNIT/ML Solostar Pen Inject 80 Units into the skin every morning. 05/16/20   [provider]  levothyroxine (SYNTHROID) 200 MCG tablet Take 200 mcg by mouth daily before breakfast.    [provider]  Melatonin 10 MG TABS Take 10 mg by mouth at bedtime. Patient not taking: Reported on 08/15/2020    [provider]  methocarbamol (ROBAXIN) 500 MG tablet Take 1-2 tablets by mouth every 6 (six) hours as needed for muscle spasms. 07/18/20   [provider]  Metoprolol Tartrate 75 MG TABS Take 1 tablet by mouth 2 (two) times daily.    [provider]  montelukast (SINGULAIR) 10 MG tablet  Take 10 mg by mouth every morning.    [provider]  [facility] powder Apply 1 Application topically 2 (two) times daily.    [provider]  olopatadine (PATANOL) 0.1 % ophthalmic solution Place 1 drop into both eyes daily.    [provider]  OXYGEN Inhale 3-4 L into the lungs daily.    [provider]  promethazine (PHENERGAN) 25 MG tablet Take 25 mg by mouth every 6 (six) hours as needed for nausea or vomiting.    [provider]  psyllium (REGULOID) 0.52 g capsule Take 0.52 g by mouth 2 (two) times daily.    [provider]  Tiotropium Bromide Monohydrate (SPIRIVA RESPIMAT) 1.25 MCG/ACT AERS Inhale 2 puffs into the lungs daily for 30 doses. 10/03/20 11/02/20  Erin Fulling, MD  tiZANidine (ZANAFLEX) 4 MG tablet Take 4 mg by mouth at bedtime.    [provider]  tretinoin (RETIN-A) 0.05 % cream Apply to face qhs, wash off qam 02/18/22   Deirdre Evener, MD  TRULICITY 0.75 MG/0.5ML SOPN Inject 0.75 mg into the skin once a week. wednesday 07/08/20   [provider]  VALERIAN ROOT PO Take 1,200 mg by mouth at bedtime.    [provider]     Critical care time: 40 minutes     Harlon Ditty, AGACNP-BC [facility] Prefer epic messenger for cross cover needs If after hours, please call E-link

## 2023-04-30 NOTE — Progress Notes (Signed)
 0900 Sedation weaned by 50% for wakeup assessment 0930 Patient started on Precedex to transition completely off of sedation. 1000 Sedation turned off.  Patient placed on spontaneous mode on ventilator. Patient's tidal volumes are greater than 550 but respiratory rate increased to 35 breaths per minute. Respiratory effort also increased. Precedex increased to 1.2 mcg/kg/hr. Respiratory effort unchanged. 1030 Placed back on regular ventilator settings while Precedex infusing. No change in respiratory effort or rate. Restarted on Fentanyl and Propofol. 1045 Sedation increased to Propofol 35 mcg/kg/min.and Fentanyl 150 mcg/hr. Precedex turned off. Patient calmed quickly.

## 2023-04-30 NOTE — Consult Note (Signed)
 PHARMACY - ANTICOAGULATION CONSULT NOTE  Pharmacy Consult for Heparin Infusion Indication: atrial fibrillation  No Known Allergies  Patient Measurements: Height: 5\' 8"  (172.7 cm) Weight: 114.8 kg (253 lb 1.4 oz) IBW/kg (Calculated) : 63.9 Heparin Dosing Weight: 88.8 kg  Vital Signs: Temp: 98.8 F (37.1 C) (02/19 0500) Temp Source: Bladder (02/19 0400) BP: 128/52 (02/19 0500) Pulse Rate: 74 (02/19 0500)  Labs: Recent Labs    04/27/23 1551 04/27/23 2115 04/28/23 0309 04/28/23 0616 04/28/23 0616 04/28/23 1352 04/28/23 1551 04/29/23 0415 04/29/23 1543 04/30/23 0406  HGB   < >  --  10.0*  --   --   --   --  9.7*  --  9.7*  HCT  --   --  30.3*  --   --   --   --  28.5*  --  28.8*  PLT  --   --  320  --   --   --   --  280  --  302  APTT  --  62*  --  69*  --  74*  --   --   --   --   HEPARINUNFRC  --   --   --  0.65   < > 0.61  --  0.62  --  0.42  CREATININE  --   --  2.43*  --   --   --    < > 1.31* 1.01* 1.45*  TROPONINIHS  --   --  49*  --   --   --   --   --   --   --    < > = values in this interval not displayed.   Estimated Creatinine Clearance: 48.7 mL/min (A) (by C-G formula based on SCr of 1.45 mg/dL (H)).  Medical History: Past Medical History:  Diagnosis Date   Arthritis    Asthma    Cataract of both eyes    Complication of anesthesia    woke up during surgery x1   COPD (chronic obstructive pulmonary disease) (HCC)    Coronary artery disease    DDD (degenerative disc disease), lumbar    Diabetes mellitus without complication (HCC)    Dyspnea    DUE TO COPD   Hernia of abdominal wall    History of hiatal hernia    small   Hyperlipidemia    Hypertension    Hypothyroidism    hashimotos throiditis  -thyroidectomy  2000   Occasional tremors    Oxygen desaturation    NOW PT ON 3-4 LITERS [place] DAILY   Medications:  Medications Prior to Admission  Medication Sig Dispense Refill Last Dose/Taking   albuterol (VENTOLIN HFA) 108 (90 Base) MCG/ACT  inhaler Inhale 2 puffs into the lungs every 4 (four) hours as needed for wheezing or shortness of breath.   Taking As Needed   allopurinol (ZYLOPRIM) 100 MG tablet Take 100 mg by mouth every morning.   Past Week   amLODipine (NORVASC) 5 MG tablet Take 5 mg by mouth every morning.   Past Week   atorvastatin (LIPITOR) 40 MG tablet Take 40 mg by mouth every morning.   Past Week   benazepril (LOTENSIN) 40 MG tablet Take 40 mg by mouth every morning.   Past Week   ELIQUIS 5 MG TABS tablet Take 5 mg by mouth 2 (two) times daily.   Past Week   fluticasone (FLONASE) 50 MCG/ACT nasal spray Place 2 sprays into both nostrils daily as needed for allergies.  Past Week   fluticasone-salmeterol (ADVAIR HFA) 115-21 MCG/ACT inhaler Inhale 2 puffs by mouth twice daily 12 g 0 Past Week   furosemide (LASIX) 80 MG tablet Take 80 mg by mouth every morning.   Past Week   gabapentin (NEURONTIN) 600 MG tablet Take 600 mg by mouth 3 (three) times daily.   Past Week   hydrochlorothiazide (HYDRODIURIL) 25 MG tablet Take 25 mg by mouth every morning.   Past Week   indomethacin (INDOCIN) 25 MG capsule Take 25 mg by mouth 3 (three) times daily with meals.   Past Week   levocetirizine (XYZAL) 5 MG tablet Take 5 mg by mouth every evening.   Past Week   levothyroxine (SYNTHROID) 175 MCG tablet Take 175 mcg by mouth daily before breakfast.   Past Week   methocarbamol (ROBAXIN) 500 MG tablet Take 1-2 tablets by mouth every 6 (six) hours as needed for muscle spasms.   Past Week   Metoprolol Tartrate 75 MG TABS Take 1 tablet by mouth 2 (two) times daily.   Past Week   montelukast (SINGULAIR) 10 MG tablet Take 10 mg by mouth every morning.   Past Week   MOUNJARO 10 MG/0.5ML Pen Inject 10 mg into the skin once a week.   Past Week   [facility] powder Apply 1 Application topically 2 (two) times daily.   Past Week   promethazine (PHENERGAN) 25 MG tablet Take 25 mg by mouth every 6 (six) hours as needed for nausea or vomiting.   Taking As  Needed   Tiotropium Bromide Monohydrate (SPIRIVA RESPIMAT) 1.25 MCG/ACT AERS Inhale 2 puffs into the lungs daily for 30 doses. 3 each 0 Past Week   tiZANidine (ZANAFLEX) 4 MG tablet Take 4 mg by mouth 3 (three) times daily.   Past Week   tretinoin (RETIN-A) 0.05 % cream Apply to face qhs, wash off qam 45 g 11 Past Week   OXYGEN Inhale 3-4 L into the lungs daily.      Pertinent Medications: PTA apixaban, last dose unknown  Last dose heparin sq 2/15 @0522 .   Assessment: 68 year old female with a past medical history significant for COPD on home supplemental oxygen and diabetes mellitus who presented to [facility] ED on 04/24/2023 due to unresponsiveness. PMH of afib on apixaban. CHADSVASc 3. Started on CRRT 2/15. Pharmacy was consulted to dose and manage this patient's heparin.   Date Time aPTT/HL Rate/Comment  2/15 1947 45/---  1200/SUBtherapeutic 2/16 0552 72/0.81 1500/aPTT therapeutic  2/16    1336     67/--                1500/aPTT therapeutic    2/16 2115 62/--  1500/aPTT SUBtherapeutic 2/17 0616 69/0.65 1650/both levels therapeutic x 1 2/17 1352 74/0.61 1650/both levels therapeutic x 2 2/18 0415 -- / 0.62 1650/therapeutic x 3 2/19 0406 -- / 0.42 1650/therapeutic x 4  Baseline Labs:  Hgb 9.5 Plt 325 INR/PT 1.1/14.3  HL 0.67 - possibly has some false elevated due to apixaban.   Goal of Therapy:  Heparin level 0.3-0.7 units/ml aPTT 66-102 seconds Monitor platelets by anticoagulation protocol: Yes  Plan:  Continue heparin infusion at 1650 units/hr Recheck HL daily w/ AM labs while therapeutic Monitor CBC and HL daily while on heparin   Otelia Sergeant, PharmD, MBA 04/30/2023 6:00 AM

## 2023-04-30 NOTE — Progress Notes (Signed)
 [facility] [place], [place] 04/30/23  Subjective:   Hospital day # 6 Patient remains critically ill intubated and sedated. Heparin drip continued Plan for extubation later today cvs: Off pressors  pulm: Ventilator assisted.  FiO2 35%/PEEP 5 gi: OG tube.  Rectal tube in place Renal: Continued on CRRT  Urine output remains good.  02/18 0701 - 02/19 0700 In: 3416.4 [I.V.:1406.4; NG/GT:1850; IV Piggyback:100] Out: 2515 [Urine:1155; Stool:130] Lab Results  Component Value Date   CREATININE 1.45 (H) 04/30/2023   CREATININE 1.01 (H) 04/29/2023   CREATININE 1.31 (H) 04/29/2023     Objective:  Vital signs in last 24 hours:  Temp:  [97.9 F (36.6 C)-99.5 F (37.5 C)] 97.9 F (36.6 C) (02/19 1030) Pulse Rate:  [50-147] 147 (02/19 1030) Resp:  [17-44] 39 (02/19 1030) BP: (81-218)/(38-131) 218/110 (02/19 1030) SpO2:  [88 %-98 %] 89 % (02/19 1116) Arterial Line BP: (183-202)/(64-77) 202/74 (02/18 1300) FiO2 (%):  [35 %-40 %] 40 % (02/19 1116) Weight:  [114.8 kg] 114.8 kg (02/19 0310)  Weight change: -2.7 kg Filed Weights   04/28/23 0500 04/29/23 0320 04/30/23 0310  Weight: 117.9 kg 117.5 kg 114.8 kg    Intake/Output:    Intake/Output Summary (Last 24 hours) at 04/30/2023 1156 Last data filed at 04/30/2023 1040 Gross per 24 hour  Intake 3406.42 ml  Output 3135 ml  Net 271.42 ml     Physical Exam: General Appearance-critically ill-appearing female, laying in the bed HEENT-ET tube in place, OG tube in place Pulmonary-ventilator assisted.  Clear to auscultation bilaterally Cardiac-A Flutter noted on telemetry Abdomen-nondistended Extremities-SCDs in place, trace edema Skin-no acute rashes noted Neuro-sedated Right IJ temporary dialysis catheter  Foley in place Rectal tube in place  Basic Metabolic Panel:  Recent Labs  Lab 04/26/23 0731 04/26/23 1824 04/27/23 0424 04/27/23 1551 04/28/23 0309 04/28/23 1551 04/29/23 0415 04/29/23 1543  04/30/23 0406  NA  --    < > 134*   < > 134* 133* 131* 134* 133*  K  --    < > 4.2   < > 4.8 5.0 4.4 3.6 3.6  CL  --    < > 99   < > 101 102 98 101 99  CO2  --    < > 21*   < > 23 25 24 26 25   GLUCOSE  --    < > 168*   < > 249* 235* 235* 155* 155*  BUN  --    < > 72*   < > 48* 44* 38* 37* 51*  CREATININE  --    < > 3.86*   < > 2.43* 1.73* 1.31* 1.01* 1.45*  CALCIUM  --    < > 7.8*   < > 7.8* 7.7* 8.7* 8.8* 9.3  MG 2.0  --  2.2  --  2.4  --  2.3  --  2.0  PHOS  --   --  3.0   < > 2.9 2.7 2.7 2.3* 2.5   < > = values in this interval not displayed.     CBC: Recent Labs  Lab 04/24/23 1240 04/25/23 0411 04/26/23 0357 04/27/23 0424 04/28/23 0309 04/29/23 0415 04/30/23 0406  WBC 33.4*   < > 46.7* 41.5* 31.3* 26.2* 29.4*  NEUTROABS 28.0*  --   --   --   --   --   --   HGB 12.7   < > 9.8* 9.5* 10.0* 9.7* 9.7*  HCT 38.1   < > 29.1*  28.2* 30.3* 28.5* 28.8*  MCV 91.4   < > 92.1 89.5 91.3 89.3 90.3  PLT 309   < > 332 325 320 280 302   < > = values in this interval not displayed.     No results found for: "HEPBSAG", "HEPBSAB", "HEPBIGM"    Microbiology:  Recent Results (from the past 240 hours)  Culture, blood (Routine x 2)     Status: None   Collection Time: 04/24/23 12:40 PM   Specimen: BLOOD  Result Value Ref Range Status   Specimen Description   Final    BLOOD RIGHT ARM Performed at [facility], [address] [place], [place] [address]    Special Requests   Final    BOTTLES DRAWN AEROBIC AND ANAEROBIC Blood Culture adequate volume Performed at [facility], [address] [place], [place] [address]    Culture   Final    NO GROWTH 5 DAYS Performed at [facility], [address], [place], [place] [address]    Report Status 04/29/2023 FINAL  Final  Culture, blood (Routine x 2)     Status: None   Collection Time: 04/24/23 12:42 PM   Specimen: BLOOD  Result Value Ref Range Status   Specimen Description   Final    BLOOD LEFT  ANTECUBITAL Performed at [facility], [address], [place], [place] [address]    Special Requests   Final    BOTTLES DRAWN AEROBIC AND ANAEROBIC Blood Culture adequate volume Performed at [facility], [address], [place], [place] [address]    Culture   Final    NO GROWTH 5 DAYS Performed at [facility], [address], [place], [place] [address]    Report Status 04/29/2023 FINAL  Final  Resp panel by RT-PCR (RSV, Flu A&B, Covid) Anterior Nasal Swab     Status: Abnormal   Collection Time: 04/24/23 12:42 PM   Specimen: Anterior Nasal Swab  Result Value Ref Range Status   SARS Coronavirus 2 by RT PCR NEGATIVE NEGATIVE Final    Comment: (NOTE) SARS-CoV-2 target nucleic acids are NOT DETECTED.  The SARS-CoV-2 RNA is generally detectable in upper respiratory specimens during the acute phase of infection. The lowest concentration of SARS-CoV-2 viral copies this assay can detect is 138 copies/mL. A negative result does not preclude SARS-Cov-2 infection and should not be used as the sole basis for treatment or other patient management decisions. A negative result may occur with  improper specimen collection/handling, submission of specimen other than nasopharyngeal swab, presence of viral mutation(s) within the areas targeted by this assay, and inadequate number of viral copies(<138 copies/mL). A negative result must be combined with clinical observations, patient history, and epidemiological information. The expected result is Negative.  Fact Sheet for Patients:  BloggerCourse.com  Fact Sheet for Healthcare Providers:  SeriousBroker.it  This test is no t yet approved or cleared by the [place] FDA and  has been authorized for detection and/or diagnosis of SARS-CoV-2 by FDA under an Emergency Use Authorization (EUA). This EUA will remain  in effect (meaning this test can be used) for the  duration of the COVID-19 declaration under Section 564(b)(1) of the Act, 21 U.S.C.section 360bbb-3(b)(1), unless the authorization is terminated  or revoked sooner.       Influenza A by PCR POSITIVE (A) NEGATIVE Final   Influenza B by PCR NEGATIVE NEGATIVE Final    Comment: (NOTE) The Xpert Xpress SARS-CoV-2/FLU/RSV plus assay is intended  as an aid in the diagnosis of influenza from Nasopharyngeal swab specimens and should not be used as a sole basis for treatment. Nasal washings and aspirates are unacceptable for Xpert Xpress SARS-CoV-2/FLU/RSV testing.  Fact Sheet for Patients: BloggerCourse.com  Fact Sheet for Healthcare Providers: SeriousBroker.it  This test is not yet approved or cleared by the [place] FDA and has been authorized for detection and/or diagnosis of SARS-CoV-2 by FDA under an Emergency Use Authorization (EUA). This EUA will remain in effect (meaning this test can be used) for the duration of the COVID-19 declaration under Section 564(b)(1) of the Act, 21 U.S.C. section 360bbb-3(b)(1), unless the authorization is terminated or revoked.     Resp Syncytial Virus by PCR NEGATIVE NEGATIVE Final    Comment: (NOTE) Fact Sheet for Patients: BloggerCourse.com  Fact Sheet for Healthcare Providers: SeriousBroker.it  This test is not yet approved or cleared by the [place] FDA and has been authorized for detection and/or diagnosis of SARS-CoV-2 by FDA under an Emergency Use Authorization (EUA). This EUA will remain in effect (meaning this test can be used) for the duration of the COVID-19 declaration under Section 564(b)(1) of the Act, 21 U.S.C. section 360bbb-3(b)(1), unless the authorization is terminated or revoked.  Performed at [facility], [address] [place], [place] [address]   MRSA Next Gen by PCR, Nasal     Status: None    Collection Time: 04/24/23  9:52 PM   Specimen: Nasal Mucosa; Nasal Swab  Result Value Ref Range Status   MRSA by PCR Next Gen NOT DETECTED NOT DETECTED Final    Comment: (NOTE) The GeneXpert MRSA Assay (FDA approved for NASAL specimens only), is one component of a comprehensive MRSA colonization surveillance program. It is not intended to diagnose MRSA infection nor to guide or monitor treatment for MRSA infections. Test performance is not FDA approved in patients less than 19 years old. Performed at [facility], [address] [place], [place] [address]   Respiratory (~20 pathogens) panel by PCR     Status: Abnormal   Collection Time: 04/25/23  1:27 PM   Specimen: Nasopharyngeal Swab; Respiratory  Result Value Ref Range Status   Adenovirus NOT DETECTED NOT DETECTED Final   Coronavirus 229E NOT DETECTED NOT DETECTED Final    Comment: (NOTE) The Coronavirus on the Respiratory Panel, DOES NOT test for the novel  Coronavirus (2019 nCoV)    Coronavirus HKU1 NOT DETECTED NOT DETECTED Final   Coronavirus NL63 NOT DETECTED NOT DETECTED Final   Coronavirus OC43 NOT DETECTED NOT DETECTED Final   Metapneumovirus NOT DETECTED NOT DETECTED Final   Rhinovirus / Enterovirus NOT DETECTED NOT DETECTED Final   Influenza A H3 DETECTED (A) NOT DETECTED Final   Influenza B NOT DETECTED NOT DETECTED Final   Parainfluenza Virus 1 NOT DETECTED NOT DETECTED Final   Parainfluenza Virus 2 NOT DETECTED NOT DETECTED Final   Parainfluenza Virus 3 NOT DETECTED NOT DETECTED Final   Parainfluenza Virus 4 NOT DETECTED NOT DETECTED Final   Respiratory Syncytial Virus NOT DETECTED NOT DETECTED Final   Bordetella pertussis NOT DETECTED NOT DETECTED Final   Bordetella Parapertussis NOT DETECTED NOT DETECTED Final   Chlamydophila pneumoniae NOT DETECTED NOT DETECTED Final   Mycoplasma pneumoniae NOT DETECTED NOT DETECTED Final    Comment: Performed at [facility], [address],  [place], [place] [address]  Culture, Respiratory w Gram Stain     Status: None   Collection Time: 04/26/23  3:45  PM   Specimen: Tracheal Aspirate  Result Value Ref Range Status   Specimen Description   Final    TRACHEAL ASPIRATE Performed at [facility], [address], [place], [place] [address]    Special Requests   Final    NONE Performed at [facility], [address] [place], [place] [address]    Gram Stain   Final    RARE SQUAMOUS EPITHELIAL CELLS PRESENT WBC PRESENT,BOTH PMN AND MONONUCLEAR RARE GRAM POSITIVE COCCI    Culture   Final    NO GROWTH 2 DAYS Performed at [facility], [address] [place], [place] [address]    Report Status 04/29/2023 FINAL  Final    Coagulation Studies: No results for input(s): "LABPROT", "INR" in the last 72 hours.  Urinalysis: No results for input(s): "COLORURINE", "LABSPEC", "PHURINE", "GLUCOSEU", "HGBUR", "BILIRUBINUR", "KETONESUR", "PROTEINUR", "UROBILINOGEN", "NITRITE", "LEUKOCYTESUR" in the last 72 hours.  Invalid input(s): "APPERANCEUR"    Imaging: DG Chest Port 1 View Result Date: 04/30/2023 CLINICAL DATA:  Acute respiratory failure with hypoxia and hypercarbia EXAM: PORTABLE CHEST 1 VIEW COMPARISON:  04/26/2023 FINDINGS: 2 frontal views of the chest demonstrate endotracheal tube overlying tracheal air column, tip approximately 5.5 cm above carina. Enteric catheter passes below diaphragm, tip projecting over the gastric body. Bilateral internal jugular catheters are seen, tips projecting over the superior vena cava. Cardiac silhouette is enlarged but stable. There is persistent but improved pulmonary vascular congestion. Improved aeration of the lung bases, with minimal residual left basilar consolidation and/or effusion. No pneumothorax. IMPRESSION: 1. Support devices as above. 2. Improved aeration of the lungs, with improved volume status. Persistent mild pulmonary vascular congestion, with likely small left  effusion and left basilar atelectasis. Electronically Signed   By: Sharlet Salina M.D.   On: 04/30/2023 11:02      Medications:    dexmedetomidine (PRECEDEX) IV infusion 1 mcg/kg/hr (04/30/23 0935)   feeding supplement (VITAL HIGH PROTEIN) 50 mL/hr at 04/30/23 0400   fentaNYL infusion INTRAVENOUS Stopped (04/30/23 0939)   heparin 1,650 Units/hr (04/30/23 0400)   norepinephrine (LEVOPHED) Adult infusion 2 mcg/min (04/30/23 0400)   propofol (DIPRIVAN) infusion Stopped (04/30/23 2130)   vasopressin Stopped (04/29/23 0133)    Chlorhexidine Gluconate Cloth  6 each Topical Daily   docusate  100 mg Per Tube BID   feeding supplement (PROSource TF20)  60 mL Per Tube Daily   free water  30 mL Per Tube Q4H   insulin aspart  0-20 Units Subcutaneous Q4H   insulin glargine-yfgn  15 Units Subcutaneous BID   ipratropium-albuterol  3 mL Nebulization Q4H   levothyroxine  175 mcg Per Tube Q0600   methylPREDNISolone (SOLU-MEDROL) injection  20 mg Intravenous Daily   multivitamin  1 tablet Per Tube QHS   multivitamin with minerals  1 tablet Per Tube Daily   nutrition supplement (JUVEN)  1 packet Per Tube BID BM   mouth rinse  15 mL Mouth Rinse Q2H   pantoprazole (PROTONIX) IV  40 mg Intravenous QHS   polyethylene glycol  17 g Per Tube Daily   sodium chloride flush  10-40 mL Intracatheter Q12H   thiamine  100 mg Per Tube Daily   acetaminophen, docusate sodium, fentaNYL, fentaNYL (SUBLIMAZE) injection, fentaNYL (SUBLIMAZE) injection, heparin, midazolam, mouth rinse, polyethylene glycol, sodium chloride flush  Assessment/ Plan:  68 y.o. female with  medical problems of  COPD, home oxygen, diabetes mellitus, coronary disease, hypertension  admitted on 04/24/2023 for COPD exacerbation (HCC) [J44.1] Acute  respiratory failure with hypoxia and hypercarbia (HCC) [J96.01, J96.02] Acute respiratory failure with hypoxia and hypercapnia (HCC) [J96.01, J96.02] Community acquired pneumonia of right lower lobe of  lung [J18.9]  Acute kidney injury on chronic kidney disease stage IIIa.  IV contrast nephropathy Baseline creatinine 1 7/GFR 46 on 04/24/2023 (admission)  Acute kidney injury likely secondary to ATN from concurrent illness/sepsis and IV contrast exposure on 04/24/2023. Urinalysis-shows proteinuria of 100 mg, 0-5 WBCs, 0-5 RBCs. Urine output seems to be improving today. Will discontinue CRRT at the end of the day. Monitor renal function and evaluate for further need of intermittent hemodialysis on a daily basis.   Pneumonia, sepsis, hypotension Influenza A positive Currently requiring ventilator support Management as per ICU team.     LOS: 6 Sarah Norton Thedore Mins 2/19/202511:56 AM  [facility] [place], [place] [phone]  Note: This note was prepared with Dragon dictation. Any transcription errors are unintentional

## 2023-05-01 ENCOUNTER — Inpatient Hospital Stay: Payer: 59

## 2023-05-01 DIAGNOSIS — A419 Sepsis, unspecified organism: Secondary | ICD-10-CM | POA: Diagnosis not present

## 2023-05-01 DIAGNOSIS — J9602 Acute respiratory failure with hypercapnia: Secondary | ICD-10-CM | POA: Diagnosis not present

## 2023-05-01 DIAGNOSIS — J9601 Acute respiratory failure with hypoxia: Secondary | ICD-10-CM | POA: Diagnosis not present

## 2023-05-01 DIAGNOSIS — R6521 Severe sepsis with septic shock: Secondary | ICD-10-CM | POA: Diagnosis not present

## 2023-05-01 LAB — GLUCOSE, CAPILLARY
Glucose-Capillary: 151 mg/dL — ABNORMAL HIGH (ref 70–99)
Glucose-Capillary: 160 mg/dL — ABNORMAL HIGH (ref 70–99)
Glucose-Capillary: 172 mg/dL — ABNORMAL HIGH (ref 70–99)
Glucose-Capillary: 175 mg/dL — ABNORMAL HIGH (ref 70–99)
Glucose-Capillary: 187 mg/dL — ABNORMAL HIGH (ref 70–99)
Glucose-Capillary: 226 mg/dL — ABNORMAL HIGH (ref 70–99)

## 2023-05-01 LAB — CBC
HCT: 30 % — ABNORMAL LOW (ref 36.0–46.0)
Hemoglobin: 10.1 g/dL — ABNORMAL LOW (ref 12.0–15.0)
MCH: 30.2 pg (ref 26.0–34.0)
MCHC: 33.7 g/dL (ref 30.0–36.0)
MCV: 89.8 fL (ref 80.0–100.0)
Platelets: 331 10*3/uL (ref 150–400)
RBC: 3.34 MIL/uL — ABNORMAL LOW (ref 3.87–5.11)
RDW: 16.6 % — ABNORMAL HIGH (ref 11.5–15.5)
WBC: 34.6 10*3/uL — ABNORMAL HIGH (ref 4.0–10.5)
nRBC: 0.2 % (ref 0.0–0.2)

## 2023-05-01 LAB — TRIGLYCERIDES: Triglycerides: 144 mg/dL (ref <150)

## 2023-05-01 LAB — RENAL FUNCTION PANEL
Albumin: 2.4 g/dL — ABNORMAL LOW (ref 3.5–5.0)
Anion gap: 10 (ref 5–15)
BUN: 73 mg/dL — ABNORMAL HIGH (ref 8–23)
CO2: 26 mmol/L (ref 22–32)
Calcium: 9.5 mg/dL (ref 8.9–10.3)
Chloride: 100 mmol/L (ref 98–111)
Creatinine, Ser: 1.32 mg/dL — ABNORMAL HIGH (ref 0.44–1.00)
GFR, Estimated: 44 mL/min — ABNORMAL LOW (ref >60)
Glucose, Bld: 189 mg/dL — ABNORMAL HIGH (ref 70–99)
Phosphorus: 2.9 mg/dL (ref 2.5–4.6)
Potassium: 3.8 mmol/L (ref 3.5–5.1)
Sodium: 136 mmol/L (ref 135–145)

## 2023-05-01 LAB — HEPARIN LEVEL (UNFRACTIONATED): Heparin Unfractionated: 0.39 [IU]/mL (ref 0.30–0.70)

## 2023-05-01 LAB — MAGNESIUM: Magnesium: 2 mg/dL (ref 1.7–2.4)

## 2023-05-01 MED ORDER — BETHANECHOL CHLORIDE 10 MG PO TABS
10.0000 mg | ORAL_TABLET | Freq: Three times a day (TID) | ORAL | Status: DC
  Administered 2023-05-01: 10 mg
  Filled 2023-05-01 (×2): qty 1

## 2023-05-01 MED ORDER — SENNA 8.6 MG PO TABS
2.0000 | ORAL_TABLET | Freq: Every day | ORAL | Status: DC
  Administered 2023-05-01 – 2023-05-04 (×4): 17.2 mg
  Filled 2023-05-01 (×4): qty 2

## 2023-05-01 NOTE — IPAL (Signed)
  Interdisciplinary Goals of Care Family Meeting   Date carried out: 05/01/2023  Location of the meeting: Bedside  Member's involved: Physician, Nurse Practitioner, Bedside Registered Nurse, and Family Member or next of kin    GOALS OF CARE DISCUSSION  The Clinical status was relayed to family in detail- Son and daughter at Bedside  Updated and notified of patients medical condition- Patient remains unresponsive and will not open eyes to command.   Patient with increased WOB and using accessory muscles to breathe Explained to family course of therapy and the modalities  Patient with Progressive multiorgan failure with a very high probablity of a very minimal chance of meaningful recovery despite all aggressive and optimal medical therapy.    PATIENT REMAINS FULL CODE  Family understands the situation. Severe end stage COPD, severe FLU and resp failure Failure to wean from vent Plan for MRI brain, assess for [facility]  Family are satisfied with Plan of action and management. All questions answered  Additional CC time 35 mins   Nefertiti Mohamad Santiago Glad, M.D.  [facility]  Medical Director [facility] Medical Director [facility] Cardio-Pulmonary Department

## 2023-05-01 NOTE — Progress Notes (Signed)
 Pt taken to MRI on vent and back to CCU without any issues.

## 2023-05-01 NOTE — Consult Note (Signed)
 PHARMACY - ANTICOAGULATION CONSULT NOTE  Pharmacy Consult for Heparin Infusion Indication: atrial fibrillation  No Known Allergies  Patient Measurements: Height: 5' 7.99" (172.7 cm) Weight: 118 kg (260 lb 2.3 oz) IBW/kg (Calculated) : 63.88 Heparin Dosing Weight: 88.8 kg  Vital Signs: Temp: 98.1 F (36.7 C) (02/20 0400) Temp Source: Oral (02/20 0400) BP: 138/59 (02/20 0545) Pulse Rate: 74 (02/20 0545)  Labs: Recent Labs    04/28/23 1352 04/28/23 1551 04/29/23 0415 04/29/23 1543 04/30/23 0406 05/01/23 0403  HGB  --    < > 9.7*  --  9.7* 10.1*  HCT  --   --  28.5*  --  28.8* 30.0*  PLT  --   --  280  --  302 331  APTT 74*  --   --   --   --   --   HEPARINUNFRC 0.61  --  0.62  --  0.42 0.39  CREATININE  --    < > 1.31* 1.01* 1.45* 1.32*   < > = values in this interval not displayed.   Estimated Creatinine Clearance: 54.3 mL/min (A) (by C-G formula based on SCr of 1.32 mg/dL (H)).  Medical History: Past Medical History:  Diagnosis Date   Arthritis    Asthma    Cataract of both eyes    Complication of anesthesia    woke up during surgery x1   COPD (chronic obstructive pulmonary disease) (HCC)    Coronary artery disease    DDD (degenerative disc disease), lumbar    Diabetes mellitus without complication (HCC)    Dyspnea    DUE TO COPD   Hernia of abdominal wall    History of hiatal hernia    small   Hyperlipidemia    Hypertension    Hypothyroidism    hashimotos throiditis  -thyroidectomy  2000   Occasional tremors    Oxygen desaturation    NOW PT ON 3-4 LITERS [facility] DAILY   Medications:  Medications Prior to Admission  Medication Sig Dispense Refill Last Dose/Taking   albuterol (VENTOLIN HFA) 108 (90 Base) MCG/ACT inhaler Inhale 2 puffs into the lungs every 4 (four) hours as needed for wheezing or shortness of breath.   Taking As Needed   allopurinol (ZYLOPRIM) 100 MG tablet Take 100 mg by mouth every morning.   Past Week   amLODipine (NORVASC) 5 MG tablet  Take 5 mg by mouth every morning.   Past Week   atorvastatin (LIPITOR) 40 MG tablet Take 40 mg by mouth every morning.   Past Week   benazepril (LOTENSIN) 40 MG tablet Take 40 mg by mouth every morning.   Past Week   ELIQUIS 5 MG TABS tablet Take 5 mg by mouth 2 (two) times daily.   Past Week   fluticasone (FLONASE) 50 MCG/ACT nasal spray Place 2 sprays into both nostrils daily as needed for allergies.   Past Week   fluticasone-salmeterol (ADVAIR HFA) 115-21 MCG/ACT inhaler Inhale 2 puffs by mouth twice daily 12 g 0 Past Week   furosemide (LASIX) 80 MG tablet Take 80 mg by mouth every morning.   Past Week   gabapentin (NEURONTIN) 600 MG tablet Take 600 mg by mouth 3 (three) times daily.   Past Week   hydrochlorothiazide (HYDRODIURIL) 25 MG tablet Take 25 mg by mouth every morning.   Past Week   indomethacin (INDOCIN) 25 MG capsule Take 25 mg by mouth 3 (three) times daily with meals.   Past Week   levocetirizine (XYZAL) 5  MG tablet Take 5 mg by mouth every evening.   Past Week   levothyroxine (SYNTHROID) 175 MCG tablet Take 175 mcg by mouth daily before breakfast.   Past Week   methocarbamol (ROBAXIN) 500 MG tablet Take 1-2 tablets by mouth every 6 (six) hours as needed for muscle spasms.   Past Week   Metoprolol Tartrate 75 MG TABS Take 1 tablet by mouth 2 (two) times daily.   Past Week   montelukast (SINGULAIR) 10 MG tablet Take 10 mg by mouth every morning.   Past Week   MOUNJARO 10 MG/0.5ML Pen Inject 10 mg into the skin once a week.   Past Week   [facility] powder Apply 1 Application topically 2 (two) times daily.   Past Week   promethazine (PHENERGAN) 25 MG tablet Take 25 mg by mouth every 6 (six) hours as needed for nausea or vomiting.   Taking As Needed   Tiotropium Bromide Monohydrate (SPIRIVA RESPIMAT) 1.25 MCG/ACT AERS Inhale 2 puffs into the lungs daily for 30 doses. 3 each 0 Past Week   tiZANidine (ZANAFLEX) 4 MG tablet Take 4 mg by mouth 3 (three) times daily.   Past Week   tretinoin  (RETIN-A) 0.05 % cream Apply to face qhs, wash off qam 45 g 11 Past Week   OXYGEN Inhale 3-4 L into the lungs daily.      Pertinent Medications: PTA apixaban, last dose unknown  Last dose heparin sq 2/15 @0522 .   Assessment: 68 year old female with a past medical history significant for COPD on home supplemental oxygen and diabetes mellitus who presented to [facility] ED on 04/24/2023 due to unresponsiveness. PMH of afib on apixaban. CHADSVASc 3. Started on CRRT 2/15. Pharmacy was consulted to dose and manage this patient's heparin.   Date Time aPTT/HL Rate/Comment  2/15 1947 45/---  1200/SUBtherapeutic 2/16 0552 72/0.81 1500/aPTT therapeutic  2/16    1336     67/--                1500/aPTT therapeutic    2/16 2115 62/--  1500/aPTT SUBtherapeutic 2/17 0616 69/0.65 1650/both levels therapeutic x 1 2/17 1352 74/0.61 1650/both levels therapeutic x 2 2/18 0415 -- / 0.62 1650/therapeutic x 3 2/19 0406 -- / 0.42 1650/therapeutic x 4 2/20 0403 -- / 0.39 1650/therapeutic x 5  Baseline Labs:  Hgb 9.5 Plt 325 INR/PT 1.1/14.3  HL 0.67 - possibly has some false elevated due to apixaban.   Goal of Therapy:  Heparin level 0.3-0.7 units/ml aPTT 66-102 seconds Monitor platelets by anticoagulation protocol: Yes  Plan:  Continue heparin infusion at 1650 units/hr Recheck HL daily w/ AM labs while therapeutic Monitor CBC and HL daily while on heparin   Otelia Sergeant, PharmD, [facility] 05/01/2023 6:29 AM

## 2023-05-01 NOTE — Progress Notes (Signed)
 [facility] [place], [place] 05/01/23  Subjective:   Hospital day # 7 Patient remains critically ill intubated and sedated. Heparin drip continued cvs: Off pressors  pulm: Ventilator assisted.  FiO2 35%/PEEP 5 gi: OG tube.  Rectal tube in place Renal:  Urine output remains good. S Creatinine is improving spontneously  02/19 0701 - 02/20 0700 In: 3145.5 [I.V.:1265.5; NG/GT:1280] Out: 3105 [Urine:3075; Stool:30] Lab Results  Component Value Date   CREATININE 1.32 (H) 05/01/2023   CREATININE 1.45 (H) 04/30/2023   CREATININE 1.01 (H) 04/29/2023     Objective:  Vital signs in last 24 hours:  Temp:  [98.1 F (36.7 C)-99.1 F (37.3 C)] 98.3 F (36.8 C) (02/20 0800) Pulse Rate:  [50-108] 92 (02/20 1000) Resp:  [18-30] 30 (02/20 1000) BP: (78-188)/(41-102) 188/77 (02/20 1000) SpO2:  [94 %-100 %] 96 % (02/20 1000) FiO2 (%):  [35 %] 35 % (02/20 1101) Weight:  [595 kg] 118 kg (02/20 0330)  Weight change: 3.2 kg Filed Weights   04/29/23 0320 04/30/23 0310 05/01/23 0330  Weight: 117.5 kg 114.8 kg 118 kg    Intake/Output:    Intake/Output Summary (Last 24 hours) at 05/01/2023 1340 Last data filed at 05/01/2023 1052 Gross per 24 hour  Intake 2735.48 ml  Output 2475 ml  Net 260.48 ml     Physical Exam: General Appearance-critically ill-appearing female, laying in the bed HEENT-ET tube in place, OG tube in place Pulmonary-ventilator assisted.  Clear to auscultation bilaterally Cardiac-A Flutter noted on telemetry Abdomen-nondistended Extremities-SCDs in place, trace edema Skin-no acute rashes noted Neuro-sedated Right IJ temporary dialysis catheter    Basic Metabolic Panel:  Recent Labs  Lab 04/27/23 0424 04/27/23 1551 04/28/23 0309 04/28/23 1551 04/29/23 0415 04/29/23 1543 04/30/23 0406 05/01/23 0403  NA 134*   < > 134* 133* 131* 134* 133* 136  K 4.2   < > 4.8 5.0 4.4 3.6 3.6 3.8  CL 99   < > 101 102 98 101 99 100  CO2 21*   < > 23  25 24 26 25 26   GLUCOSE 168*   < > 249* 235* 235* 155* 155* 189*  BUN 72*   < > 48* 44* 38* 37* 51* 73*  CREATININE 3.86*   < > 2.43* 1.73* 1.31* 1.01* 1.45* 1.32*  CALCIUM 7.8*   < > 7.8* 7.7* 8.7* 8.8* 9.3 9.5  MG 2.2  --  2.4  --  2.3  --  2.0 2.0  PHOS 3.0   < > 2.9 2.7 2.7 2.3* 2.5 2.9   < > = values in this interval not displayed.     CBC: Recent Labs  Lab 04/27/23 0424 04/28/23 0309 04/29/23 0415 04/30/23 0406 05/01/23 0403  WBC 41.5* 31.3* 26.2* 29.4* 34.6*  HGB 9.5* 10.0* 9.7* 9.7* 10.1*  HCT 28.2* 30.3* 28.5* 28.8* 30.0*  MCV 89.5 91.3 89.3 90.3 89.8  PLT 325 320 280 302 331     No results found for: "HEPBSAG", "HEPBSAB", "HEPBIGM"    Microbiology:  Recent Results (from the past 240 hours)  Culture, blood (Routine x 2)     Status: None   Collection Time: 04/24/23 12:40 PM   Specimen: BLOOD  Result Value Ref Range Status   Specimen Description   Final    BLOOD RIGHT ARM Performed at [facility], [address] [place], [place] [address]    Special Requests   Final    BOTTLES DRAWN AEROBIC AND ANAEROBIC Blood Culture adequate volume Performed at  [facility], [address] [place], [place] [address]    Culture   Final    NO GROWTH 5 DAYS Performed at [facility], [address], [place], [place] [address]    Report Status 04/29/2023 FINAL  Final  Culture, blood (Routine x 2)     Status: None   Collection Time: 04/24/23 12:42 PM   Specimen: BLOOD  Result Value Ref Range Status   Specimen Description   Final    BLOOD LEFT ANTECUBITAL Performed at [facility], [address] [place], [place] [address]    Special Requests   Final    BOTTLES DRAWN AEROBIC AND ANAEROBIC Blood Culture adequate volume Performed at [facility], [address] [place], [place] [address]    Culture   Final    NO GROWTH 5 DAYS Performed at [facility], [address] [place], [place] [address]    Report Status  04/29/2023 FINAL  Final  Resp panel by RT-PCR (RSV, Flu A&B, Covid) Anterior Nasal Swab     Status: Abnormal   Collection Time: 04/24/23 12:42 PM   Specimen: Anterior Nasal Swab  Result Value Ref Range Status   SARS Coronavirus 2 by RT PCR NEGATIVE NEGATIVE Final    Comment: (NOTE) SARS-CoV-2 target nucleic acids are NOT DETECTED.  The SARS-CoV-2 RNA is generally detectable in upper respiratory specimens during the acute phase of infection. The lowest concentration of SARS-CoV-2 viral copies this assay can detect is 138 copies/mL. A negative result does not preclude SARS-Cov-2 infection and should not be used as the sole basis for treatment or other patient management decisions. A negative result may occur with  improper specimen collection/handling, submission of specimen other than nasopharyngeal swab, presence of viral mutation(s) within the areas targeted by this assay, and inadequate number of viral copies(<138 copies/mL). A negative result must be combined with clinical observations, patient history, and epidemiological information. The expected result is Negative.  Fact Sheet for Patients:  BloggerCourse.com  Fact Sheet for Healthcare Providers:  SeriousBroker.it  This test is no t yet approved or cleared by the [place] FDA and  has been authorized for detection and/or diagnosis of SARS-CoV-2 by FDA under an Emergency Use Authorization (EUA). This EUA will remain  in effect (meaning this test can be used) for the duration of the COVID-19 declaration under Section 564(b)(1) of the Act, 21 U.S.C.section 360bbb-3(b)(1), unless the authorization is terminated  or revoked sooner.       Influenza A by PCR POSITIVE (A) NEGATIVE Final   Influenza B by PCR NEGATIVE NEGATIVE Final    Comment: (NOTE) The Xpert Xpress SARS-CoV-2/FLU/RSV plus assay is intended as an aid in the diagnosis of influenza from Nasopharyngeal swab  specimens and should not be used as a sole basis for treatment. Nasal washings and aspirates are unacceptable for Xpert Xpress SARS-CoV-2/FLU/RSV testing.  Fact Sheet for Patients: BloggerCourse.com  Fact Sheet for Healthcare Providers: SeriousBroker.it  This test is not yet approved or cleared by the [place] FDA and has been authorized for detection and/or diagnosis of SARS-CoV-2 by FDA under an Emergency Use Authorization (EUA). This EUA will remain in effect (meaning this test can be used) for the duration of the COVID-19 declaration under Section 564(b)(1) of the Act, 21 U.S.C. section 360bbb-3(b)(1), unless the authorization is terminated or revoked.     Resp Syncytial Virus by PCR NEGATIVE NEGATIVE Final    Comment: (NOTE) Fact Sheet for Patients: BloggerCourse.com  Fact Sheet for Healthcare Providers: SeriousBroker.it  This test is not yet approved or cleared by the [place] FDA and has been authorized for detection and/or diagnosis of SARS-CoV-2 by FDA under an Emergency Use Authorization (EUA). This EUA will remain in effect (meaning this test can be used) for the duration of the COVID-19 declaration under Section 564(b)(1) of the Act, 21 U.S.C. section 360bbb-3(b)(1), unless the authorization is terminated or revoked.  Performed at [facility], [address] [place], [place] [address]   MRSA Next Gen by PCR, Nasal     Status: None   Collection Time: 04/24/23  9:52 PM   Specimen: Nasal Mucosa; Nasal Swab  Result Value Ref Range Status   MRSA by PCR Next Gen NOT DETECTED NOT DETECTED Final    Comment: (NOTE) The GeneXpert MRSA Assay (FDA approved for NASAL specimens only), is one component of a comprehensive MRSA colonization surveillance program. It is not intended to diagnose MRSA infection nor to guide or monitor treatment for MRSA  infections. Test performance is not FDA approved in patients less than 60 years old. Performed at [facility], [address] [place], [place] [address]   Respiratory (~20 pathogens) panel by PCR     Status: Abnormal   Collection Time: 04/25/23  1:27 PM   Specimen: Nasopharyngeal Swab; Respiratory  Result Value Ref Range Status   Adenovirus NOT DETECTED NOT DETECTED Final   Coronavirus 229E NOT DETECTED NOT DETECTED Final    Comment: (NOTE) The Coronavirus on the Respiratory Panel, DOES NOT test for the novel  Coronavirus (2019 nCoV)    Coronavirus HKU1 NOT DETECTED NOT DETECTED Final   Coronavirus NL63 NOT DETECTED NOT DETECTED Final   Coronavirus OC43 NOT DETECTED NOT DETECTED Final   Metapneumovirus NOT DETECTED NOT DETECTED Final   Rhinovirus / Enterovirus NOT DETECTED NOT DETECTED Final   Influenza A H3 DETECTED (A) NOT DETECTED Final   Influenza B NOT DETECTED NOT DETECTED Final   Parainfluenza Virus 1 NOT DETECTED NOT DETECTED Final   Parainfluenza Virus 2 NOT DETECTED NOT DETECTED Final   Parainfluenza Virus 3 NOT DETECTED NOT DETECTED Final   Parainfluenza Virus 4 NOT DETECTED NOT DETECTED Final   Respiratory Syncytial Virus NOT DETECTED NOT DETECTED Final   Bordetella pertussis NOT DETECTED NOT DETECTED Final   Bordetella Parapertussis NOT DETECTED NOT DETECTED Final   Chlamydophila pneumoniae NOT DETECTED NOT DETECTED Final   Mycoplasma pneumoniae NOT DETECTED NOT DETECTED Final    Comment: Performed at [facility], [address] [place], [place] [address]  Culture, Respiratory w Gram Stain     Status: None   Collection Time: 04/26/23  3:45 PM   Specimen: Tracheal Aspirate  Result Value Ref Range Status   Specimen Description   Final    TRACHEAL ASPIRATE Performed at [facility], [address], [place], [place] [address]    Special Requests   Final    NONE Performed at [facility], [address] [place], [place]  [address]    Gram Stain   Final    RARE SQUAMOUS EPITHELIAL CELLS PRESENT WBC PRESENT,BOTH PMN AND MONONUCLEAR RARE GRAM POSITIVE COCCI    Culture   Final    NO GROWTH 2 DAYS Performed at [facility], [address], [place], [place] [address]    Report Status 04/29/2023 FINAL  Final    Coagulation Studies: No results for input(s): "LABPROT", "INR" in the last 72 hours.  Urinalysis: No results for input(s): "COLORURINE", "LABSPEC", "PHURINE", "GLUCOSEU", "HGBUR", "BILIRUBINUR", "KETONESUR", "PROTEINUR", "UROBILINOGEN", "NITRITE", "LEUKOCYTESUR" in the last 72 hours.  Invalid input(s): "APPERANCEUR"    Imaging: DG Chest Port 1 View Result Date: 04/30/2023 CLINICAL DATA:  Acute respiratory failure with hypoxia and hypercarbia EXAM: PORTABLE CHEST 1 VIEW COMPARISON:  04/26/2023 FINDINGS: 2 frontal views of the chest demonstrate endotracheal tube overlying tracheal air column, tip approximately 5.5 cm above carina. Enteric catheter passes below diaphragm, tip projecting over the gastric body. Bilateral internal jugular catheters are seen, tips projecting over the superior vena cava. Cardiac silhouette is enlarged but stable. There is persistent but improved pulmonary vascular congestion. Improved aeration of the lung bases, with minimal residual left basilar consolidation and/or effusion. No pneumothorax. IMPRESSION: 1. Support devices as above. 2. Improved aeration of the lungs, with improved volume status. Persistent mild pulmonary vascular congestion, with likely small left effusion and left basilar atelectasis. Electronically Signed   By: Sharlet Salina M.D.   On: 04/30/2023 11:02      Medications:    dexmedetomidine (PRECEDEX) IV infusion Stopped (04/30/23 1033)   feeding supplement (VITAL HIGH PROTEIN) 50 mL/hr at 05/01/23 0200   fentaNYL infusion INTRAVENOUS 125 mcg/hr (05/01/23 0200)   heparin 1,650 Units/hr (05/01/23 0825)   propofol (DIPRIVAN) infusion 30 mcg/kg/min  (05/01/23 1225)    bethanechol  10 mg Per Tube TID   Chlorhexidine Gluconate Cloth  6 each Topical Daily   feeding supplement (PROSource TF20)  60 mL Per Tube Daily   free water  30 mL Per Tube Q4H   insulin aspart  0-20 Units Subcutaneous Q4H   insulin glargine-yfgn  15 Units Subcutaneous BID   ipratropium-albuterol  3 mL Nebulization Q4H   levothyroxine  175 mcg Per Tube Q0600   methylPREDNISolone (SOLU-MEDROL) injection  20 mg Intravenous Daily   multivitamin  1 tablet Per Tube QHS   multivitamin with minerals  1 tablet Per Tube Daily   nutrition supplement (JUVEN)  1 packet Per Tube BID BM   mouth rinse  15 mL Mouth Rinse Q2H   pantoprazole (PROTONIX) IV  40 mg Intravenous QHS   senna  2 tablet Per Tube Daily   sodium chloride flush  10-40 mL Intracatheter Q12H   acetaminophen, fentaNYL, fentaNYL (SUBLIMAZE) injection, fentaNYL (SUBLIMAZE) injection, heparin, midazolam, mouth rinse, sodium chloride flush  Assessment/ Plan:  68 y.o. female with  medical problems of  COPD, home oxygen, diabetes mellitus, coronary disease, hypertension  admitted on 04/24/2023 for COPD exacerbation (HCC) [J44.1] Acute respiratory failure with hypoxia and hypercarbia (HCC) [J96.01, J96.02] Acute respiratory failure with hypoxia and hypercapnia (HCC) [J96.01, J96.02] Community acquired pneumonia of right lower lobe of lung [J18.9]  Acute kidney injury on chronic kidney disease stage IIIa.  IV contrast nephropathy Baseline creatinine 1 7/GFR 46 on 04/24/2023 (admission)  Acute kidney injury likely secondary to ATN from concurrent illness/sepsis and IV contrast exposure on 04/24/2023. Urinalysis-shows proteinuria of 100 mg, 0-5 WBCs, 0-5 RBCs. Good UOP; S Creatinine has improved without further HD Renal function seems to have recovered We will sign off. Please re consult as needed   Pneumonia, sepsis, hypotension Influenza A positive Currently requiring ventilator support Management as per ICU  team.    LOS: 7 Leif Loflin [facility] 2/20/20251:40 PM  [facility] [place], [place] [phone]  Note: This note was prepared with Dragon dictation. Any transcription errors are unintentional

## 2023-05-01 NOTE — Consult Note (Signed)
 PHARMACY CONSULT NOTE - ELECTROLYTES  Pharmacy Consult for Electrolyte Monitoring and Replacement   Recent Labs:  Height: 5' 7.99" (172.7 cm) Weight: 118 kg (260 lb 2.3 oz) IBW/kg (Calculated) : 63.88 Estimated Creatinine Clearance: 54.3 mL/min (A) (by C-G formula based on SCr of 1.32 mg/dL (H)). Potassium (mmol/L)  Date Value  05/01/2023 3.8  11/16/2013 3.5   Magnesium (mg/dL)  Date Value  16/12/9602 2.0  11/16/2013 1.0 (L)   Calcium (mg/dL)  Date Value  54/11/8117 9.5   Calcium, Total (mg/dL)  Date Value  14/78/2956 9.1   Albumin (g/dL)  Date Value  21/30/8657 2.4 (L)  11/16/2013 3.8   Phosphorus (mg/dL)  Date Value  84/69/6295 2.9   Sodium (mmol/L)  Date Value  05/01/2023 136  11/16/2013 133 (L)    Assessment  Sarah Norton is a 68 y.o. female presenting with respiratory failure and unresponsive. PMH significant for DM and COPD. Patient was started on CRRT 2/15 then taken off evening of 2/18. They continue to have good urine output. Pharmacy has been consulted to monitor and replace electrolytes while they're in the ICU.   Diet: npo Pertinent medications: free water 30 ml q4H  Goal of Therapy: Electrolytes WNL Today, 05/01/2023 K = 3.8 Mg = 2.0 Phos = 2.9 Na = 136; continue to monitor, patient low at baseline  Plan:  No replacement needed  Continue to monitor with AM labs.   Thank you for allowing pharmacy to be a part of this patient's care.  Effie Shy, PharmD Pharmacy Resident  05/01/2023 5:45 AM

## 2023-05-01 NOTE — Progress Notes (Incomplete)
0800 Bladder scanned

## 2023-05-01 NOTE — Progress Notes (Signed)
 NAME:  JAIMARIE RAPOZO, MRN:  [id number], DOB:  [birth year], LOS: 7 ADMISSION DATE:  04/24/2023, CONSULTATION DATE:  04/24/23 REFERRING MD:  Dr. Rosalia Hammers, CHIEF COMPLAINT:  Acute Respiratory Distress, hypoxia, AMS   Brief Pt Description / Synopsis:  68 y.o female admitted with Acute Metabolic Encephalopathy and Acute Hypoxic and Hypercapnic Respiratory Failure in the setting of Acute COPD Exacerbation due to Influenza A infection and questionable superimposed bacterial pneumonia, failed trial of BiPAP requiring intubation and mechanical ventilation.  Course complicated by AKI requiring initiation of CRRT.  History of Present Illness:  Malayshia All is a 68 year old female with a past medical history significant for COPD on home supplemental oxygen and diabetes mellitus who presented to [facility] ED on 04/24/2023 due to unresponsiveness.  Upon EMS arrival she was noted to be hypoxic with O2 saturations in the 40s for which she was placed on CPAP with improvement in sats to the 80s.  Upon arrival to the ED she remained somnolent but slightly arousable, and given her hypoxia, she was transitioned to BiPAP.  Pt is currently intubated and sedated, and no other history is currently available.   ED Course: Initial Vital Signs: Temperature 96.5 F axillary, pulse 87, respiratory rate 33, blood pressure 136/97, SpO2 78% Significant Labs: Sodium 134, glucose 313, BUN 59, creatinine 1.27, BNP 436, high-sensitivity troponin 139, lactic acid 1.7, WBC 33.4, D-dimer 2.13 VBG: pH 7.19/pCO2 82/pO2 39/bicarb 31.3 Positive for influenza A Urinalysis negative for UTI Imaging Chest X-ray>>IMPRESSION: *Bibasilar heterogeneous opacities, favored to represent multilobar pneumonia. Follow-up to clearing is recommended. CTa Chest>> pending currently Medications Administered: 1 L normal saline bolus, 125 mg Solu-Medrol, IV azithromycin and ceftriaxone, Levophed drip initiated  Despite BiPAP patient's ABG worsened with worsening mental  status.  Therefore ED provider elected to intubate.  PCCM is asked to admit for further workup and treatment.  Please see "significant hospital events" section below for full detailed hospital course.   Pertinent  Medical History   Past Medical History:  Diagnosis Date   Arthritis    Asthma    Cataract of both eyes    Complication of anesthesia    woke up during surgery x1   COPD (chronic obstructive pulmonary disease) (HCC)    Coronary artery disease    DDD (degenerative disc disease), lumbar    Diabetes mellitus without complication (HCC)    Dyspnea    DUE TO COPD   Hernia of abdominal wall    History of hiatal hernia    small   Hyperlipidemia    Hypertension    Hypothyroidism    hashimotos throiditis  -thyroidectomy  2000   Occasional tremors    Oxygen desaturation    NOW PT ON 3-4 LITERS [place] DAILY    Micro Data:  2/13: COVID/FLU/RSV PCR>> + Influenza A 2/13: Blood culture x2>> no growth 2/13: Strep pneumo urinary antigen>> negative 2/13: Legionella urinary antigen>> negative 2/13: HIV screen>>negative 2/13: MRSA PCR>> negative 2/15: Tracheal aspirate>> no growth  Antimicrobials:   Anti-infectives (From admission, onward)    Start     Dose/Rate Route Frequency Ordered Stop   04/30/23 2200  ceFEPIme (MAXIPIME) 1 g in sodium chloride 0.9 % 100 mL IVPB  Status:  Discontinued        1 g 200 mL/hr over 30 Minutes Intravenous Every 24 hours 04/29/23 1842 04/30/23 1006   04/29/23 1145  linezolid (ZYVOX) tablet 600 mg  Status:  Discontinued        600 mg Per Tube Every  12 hours 04/29/23 1052 04/30/23 1006   04/28/23 1015  vancomycin (VANCOREADY) IVPB 1250 mg/250 mL        1,250 mg 166.7 mL/hr over 90 Minutes Intravenous  Once 04/28/23 0921 04/28/23 1209   04/28/23 1000  oseltamivir (TAMIFLU) 6 MG/ML suspension 30 mg  Status:  Discontinued        30 mg Per Tube Daily 04/28/23 0737 04/28/23 0747   04/28/23 1000  oseltamivir (TAMIFLU) 6 MG/ML suspension 75 mg        75  mg Per Tube Daily 04/28/23 0747 04/29/23 1155   04/27/23 1000  oseltamivir (TAMIFLU) 6 MG/ML suspension 75 mg  Status:  Discontinued        75 mg Per Tube Daily 04/26/23 1834 04/28/23 0737   04/27/23 0815  vancomycin (VANCOCIN) IVPB 1000 mg/200 mL premix        1,000 mg 200 mL/hr over 60 Minutes Intravenous  Once 04/27/23 0724 04/27/23 0921   04/26/23 2200  ceFEPIme (MAXIPIME) 2 g in sodium chloride 0.9 % 100 mL IVPB  Status:  Discontinued        2 g 200 mL/hr over 30 Minutes Intravenous Every 12 hours 04/26/23 1834 04/29/23 1842   04/26/23 1000  oseltamivir (TAMIFLU) 6 MG/ML suspension 30 mg  Status:  Discontinued        30 mg Per Tube Daily 04/25/23 1017 04/26/23 1834   04/25/23 2000  vancomycin (VANCOREADY) IVPB 1250 mg/250 mL  Status:  Discontinued        1,250 mg 166.7 mL/hr over 90 Minutes Intravenous Every 24 hours 04/24/23 2106 04/25/23 1151   04/25/23 1300  ceFEPIme (MAXIPIME) 2 g in sodium chloride 0.9 % 100 mL IVPB  Status:  Discontinued        2 g 200 mL/hr over 30 Minutes Intravenous Every 24 hours 04/25/23 1157 04/26/23 1834   04/25/23 1200  azithromycin (ZITHROMAX) 500 mg in sodium chloride 0.9 % 250 mL IVPB        500 mg 250 mL/hr over 60 Minutes Intravenous Every 24 hours 04/24/23 1557 04/28/23 1312   04/25/23 1150  vancomycin variable dose per unstable renal function (pharmacist dosing)  Status:  Discontinued         Does not apply See admin instructions 04/25/23 1151 04/29/23 1052   04/25/23 1100  cefTRIAXone (ROCEPHIN) 2 g in sodium chloride 0.9 % 100 mL IVPB  Status:  Discontinued        2 g 200 mL/hr over 30 Minutes Intravenous Every 24 hours 04/24/23 1557 04/25/23 1021   04/24/23 2200  oseltamivir (TAMIFLU) 6 MG/ML suspension 30 mg  Status:  Discontinued        30 mg Per Tube 2 times daily 04/24/23 1559 04/25/23 1017   04/24/23 2045  vancomycin (VANCOREADY) IVPB 2000 mg/400 mL        2,000 mg 200 mL/hr over 120 Minutes Intravenous  Once 04/24/23 2034 04/25/23 0045    04/24/23 1245  cefTRIAXone (ROCEPHIN) 2 g in sodium chloride 0.9 % 100 mL IVPB        2 g 200 mL/hr over 30 Minutes Intravenous Once 04/24/23 1244 04/24/23 1407   04/24/23 1245  azithromycin (ZITHROMAX) 500 mg in sodium chloride 0.9 % 250 mL IVPB        500 mg 250 mL/hr over 60 Minutes Intravenous  Once 04/24/23 1244 04/24/23 1513       Significant Hospital Events: Including procedures, antibiotic start and stop dates in addition to other pertinent  events   2/13: Presented to ED with AMS, acute respiratory distress and hypoxia.  Initially trialed on BiPAP but with worsening ABG and mental status.  ED provider intubated.  PCCM asked to admit. 04/25/2023 - Patient with improving respiratory status however with worsening wbc and procalcitonin.  04/26/2023 - HD cath placed and started on CRRT. 2/17 remains on vent, on CRRT 02/18: CRRT discontinued  02/19: On minimal vent settings.  Plan for WUA/SBT as tolerated utilizing Precedex.  With SBT, increased RR/WOB/accessory muscle use.  Adequate urine output and electrolytes acceptable, no plan for HD today. 02/20: Remains on minimal vent support, SBT as tolerated. On WUA with Precedex, she opens eyes but not following commands,  Plan for MRI Brain today.  Interim History / Subjective:  As outlined above in significant hospital events section  Objective   Blood pressure (!) 144/74, pulse 75, temperature 98.1 F (36.7 C), temperature source Oral, resp. rate (!) 24, height 5' 7.99" (1.727 m), weight 118 kg, SpO2 97%. CVP:  [0 mmHg-11 mmHg] 6 mmHg  Vent Mode: PRVC FiO2 (%):  [35 %-40 %] 35 % Set Rate:  [22 bmp] 22 bmp Vt Set:  [540 mL] 540 mL PEEP:  [8 cmH20] 8 cmH20 Pressure Support:  [12 cmH20] 12 cmH20 Plateau Pressure:  [18 cmH20-22 cmH20] 18 cmH20   Intake/Output Summary (Last 24 hours) at 05/01/2023 2956 Last data filed at 05/01/2023 0400 Gross per 24 hour  Intake 3145.48 ml  Output 3105 ml  Net 40.48 ml   Filed Weights    04/29/23 0320 04/30/23 0310 05/01/23 0330  Weight: 117.5 kg 114.8 kg 118 kg    Examination: General: Critically ill-appearing female, laying in bed, intubated and sedated, no acute distress HENT: Atraumatic, normocephalic, neck supple, difficult to assess JVD due to body habitus and bilateral internal jugular central line/HD catheters.  orally intubated Lungs: Coarse throughout, even, nonlabored, overbreathing the vent Cardiovascular: Irregularly irregular rhythm, rate controlled, no murmurs, rubs, gallops Abdomen: Obese, soft, nontender, nondistended, no guarding or rebound tenderness, bowel sounds positive x 4 Extremities: Normal bulk and tone, no deformities, trace edema to bilateral lower extremities Neuro: Lightly Sedated on Precedex (RASS -1), opens eyes to voice but currently not following commands or withdrawing from pain, pupils PERRLA at 3 mm bilaterally GU: Foley catheter in place  Resolved Hospital Problem list     Assessment & Plan:   #Acute Hypoxic & Hypercapnic Respiratory Failure in the setting of AECOPD, Influenza infection, and questionable superimposed bacterial pneumonia CTa Chest on 2/13: negative for PE, AAA or dissection. Concerning for interstitial and alveolar edema vs pneumonitis, and bibasilar consolidation (R >L). -Full vent support, implement lung protective strategies -Plateau pressures less than 30 cm H20 -Wean FiO2 & PEEP as tolerated to maintain O2 sats >92% -Follow intermittent Chest X-ray & ABG as needed -Spontaneous Breathing Trials when respiratory parameters met and mental status permits -Implement VAP Bundle -Bronchodilators -IV Steroids -Completed course of ABX as above  -Completed course of Tamiflu -Volume removal with CRRT/HD  #Shock: Suspect Septic ~ RESOLVED #Elevated Troponin in setting of demand ischemia  #Atrial Fibrillation with RVR ~ RATE CONTROLLED Echocardiogram 04/25/23: LVEF 60-65%, mild LVH, normal diastolic parameters, RV  systolic function is normal, moderately elevated pulmonary artery systolic pressure, mild to moderate AS -Continuous cardiac monitoring -Maintain MAP >65 -IV fluids -Vasopressors as needed to maintain MAP goal ~ currently weaned off -Lactic acid is normalized -HS Troponin peaked at 139 -Diuresis as BP and renal function permits  -Continue Heparin  gtt  #Meets SIRS Criteria (RR 34, WBC 33.4) #Severe Sepsis in setting of Influenza A infection and questionable superimposed bacterial pneumonia -Monitor fever curve -Trend WBC's -Follow cultures as above -Complete course of Cefepime and Linezolid on 2/19  #Acute Kidney Injury on CKD Stage IIIa ~ IMPROVING #Mild Hyponatremia ~ RESOLVED -Monitor I&O's / urinary output -Follow BMP -Ensure adequate renal perfusion -Avoid nephrotoxic agents as able -Replace electrolytes as indicated ~ Pharmacy following for assistance with electrolyte replacement -Nephrology following, appreciate input ~ CRRT vs HD as per Nephrology  #Diabetes Mellitus -CBG's q4h; Target range of 140 to 180 -SSI -Follow ICU Hypo/Hyperglycemia protocol  #Acute Metabolic Encephalopathy, suspect CO2 Narcosis #Sedation needs in setting of mechanical ventilation CT Head on 2/13 negative for acute intracranial process -Treatment of metabolic derangements, sepsis, and hypercapnia as outlined above -Maintain a RASS goal of 0 to -1 -Fentanyl and Propofol as needed to maintain RASS goal -Avoid sedating medications as able -Daily wake up assessment ~ will utilized Precedex for WUA -Obtain MRI Brain 2/20     Patient is critically ill with severe acute hypoxic and hypercapnic respiratory failure requiring mechanical ventilation.  Given her underlying COPD at baseline, suspect she will be difficult to liberate from mechanical ventilation.    Best Practice (right click and "Reselect all SmartList Selections" daily)   Diet/type: Tube feeds DVT prophylaxis: Heparin gtt GI  prophylaxis: PPI Lines: Central line and HD catheter, both still needed Foley:  Yes, and it is still needed Code Status:  full code Last date of multidisciplinary goals of care discussion [2/20]  2/20: Pt's daughter and son updated at bedside by Dr. Belia Heman on plan of care.  Labs   CBC: Recent Labs  Lab 04/24/23 1240 04/25/23 0411 04/27/23 0424 04/28/23 0309 04/29/23 0415 04/30/23 0406 05/01/23 0403  WBC 33.4*   < > 41.5* 31.3* 26.2* 29.4* 34.6*  NEUTROABS 28.0*  --   --   --   --   --   --   HGB 12.7   < > 9.5* 10.0* 9.7* 9.7* 10.1*  HCT 38.1   < > 28.2* 30.3* 28.5* 28.8* 30.0*  MCV 91.4   < > 89.5 91.3 89.3 90.3 89.8  PLT 309   < > 325 320 280 302 331   < > = values in this interval not displayed.    Basic Metabolic Panel: Recent Labs  Lab 04/27/23 0424 04/27/23 1551 04/28/23 0309 04/28/23 1551 04/29/23 0415 04/29/23 1543 04/30/23 0406 05/01/23 0403  NA 134*   < > 134* 133* 131* 134* 133* 136  K 4.2   < > 4.8 5.0 4.4 3.6 3.6 3.8  CL 99   < > 101 102 98 101 99 100  CO2 21*   < > 23 25 24 26 25 26   GLUCOSE 168*   < > 249* 235* 235* 155* 155* 189*  BUN 72*   < > 48* 44* 38* 37* 51* 73*  CREATININE 3.86*   < > 2.43* 1.73* 1.31* 1.01* 1.45* 1.32*  CALCIUM 7.8*   < > 7.8* 7.7* 8.7* 8.8* 9.3 9.5  MG 2.2  --  2.4  --  2.3  --  2.0 2.0  PHOS 3.0   < > 2.9 2.7 2.7 2.3* 2.5 2.9   < > = values in this interval not displayed.   GFR: Estimated Creatinine Clearance: 54.3 mL/min (A) (by C-G formula based on SCr of 1.32 mg/dL (H)). Recent Labs  Lab 04/24/23 1240 04/24/23 1738 04/25/23 0411  04/26/23 0357 04/27/23 0424 04/28/23 0309 04/29/23 0415 04/30/23 0406 05/01/23 0403  PROCALCITON  --  1.20 19.80 46.05  --   --   --   --   --   WBC 33.4*  --  43.9* 46.7*   < > 31.3* 26.2* 29.4* 34.6*  LATICACIDVEN 1.7 1.2  --   --   --   --   --   --   --    < > = values in this interval not displayed.    Liver Function Tests: Recent Labs  Lab 04/24/23 1240 04/25/23 0411  04/26/23 0731 04/27/23 0424 04/28/23 1551 04/29/23 0415 04/29/23 1543 04/30/23 0406 05/01/23 0403  AST 40  --  26  --   --   --   --   --   --   ALT 33  --  20  --   --   --   --   --   --   ALKPHOS 94  --  75  --   --   --   --   --   --   BILITOT 0.7  --  1.2  --   --   --   --   --   --   PROT 7.8  --  6.2*  --   --   --   --   --   --   ALBUMIN 3.4*   < > 2.2*   < > 2.1* 2.3* 2.0* 2.2* 2.4*   < > = values in this interval not displayed.   No results for input(s): "LIPASE", "AMYLASE" in the last 168 hours. No results for input(s): "AMMONIA" in the last 168 hours.  ABG    Component Value Date/Time   PHART 7.38 04/29/2023 1202   PCO2ART 46 04/29/2023 1202   PO2ART 83 04/29/2023 1202   HCO3 27.2 04/29/2023 1202   ACIDBASEDEF 3.2 (H) 04/27/2023 1551   O2SAT 98.9 04/29/2023 1202     Coagulation Profile: Recent Labs  Lab 04/24/23 1240  INR 1.1    Cardiac Enzymes: No results for input(s): "CKTOTAL", "CKMB", "CKMBINDEX", "TROPONINI" in the last 168 hours.  HbA1C: Hemoglobin A1C  Date/Time Value Ref Range Status  11/16/2013 04:11 PM 6.5 (H) 4.2 - 6.3 % Final    Comment:    The American Diabetes Association recommends that a primary goal of therapy should be <7% and that physicians should reevaluate the treatment regimen in patients with HbA1c values consistently >8%.    Hgb A1c MFr Bld  Date/Time Value Ref Range Status  04/25/2023 04:11 AM 6.5 (H) 4.8 - 5.6 % Final    Comment:    (NOTE) Pre diabetes:          5.7%-6.4%  Diabetes:              >6.4%  Glycemic control for   <7.0% adults with diabetes   05/04/2020 04:33 AM 8.0 (H) 4.8 - 5.6 % Final    Comment:    (NOTE) Pre diabetes:          5.7%-6.4%  Diabetes:              >6.4%  Glycemic control for   <7.0% adults with diabetes     CBG: Recent Labs  Lab 04/30/23 1158 04/30/23 1606 04/30/23 1932 04/30/23 2314 05/01/23 0337  GLUCAP 156* 174* 193* 164* 175*    Review of Systems:   Unable  to assess due to AMS/intubation/sedation   Past Medical  History:  She,  has a past medical history of Arthritis, Asthma, Cataract of both eyes, Complication of anesthesia, COPD (chronic obstructive pulmonary disease) (HCC), Coronary artery disease, DDD (degenerative disc disease), lumbar, Diabetes mellitus without complication (HCC), Dyspnea, Hernia of abdominal wall, History of hiatal hernia, Hyperlipidemia, Hypertension, Hypothyroidism, Occasional tremors, and Oxygen desaturation.   Surgical History:   Past Surgical History:  Procedure Laterality Date   ABDOMINAL HYSTERECTOMY     CHOLECYSTECTOMY     COLONOSCOPY WITH PROPOFOL N/A 09/08/2014   Procedure: COLONOSCOPY WITH PROPOFOL;  Surgeon: Midge Minium, MD;  Location: [facility] ENDOSCOPY;  Service: Endoscopy;  Laterality: N/A;   GANGLION CYST EXCISION Right 08/15/2020   Procedure: Right index finger cyst removal;  Surgeon: Kennedy Bucker, MD;  Location: [facility] ORS;  Service: Orthopedics;  Laterality: Right;   HIATAL HERNIA REPAIR  2000   OTHER SURGICAL HISTORY     vocal cord surgery due to injury during thyroid surgery   parathyroidectomy  2000   THYROIDECTOMY     TONSILLECTOMY     TUMOR REMOVAL Right Leg     Social History:   reports that she quit smoking about 7 years ago. Her smoking use included cigarettes. She started smoking about 55 years ago. She has a 48 pack-year smoking history. She has never used smokeless tobacco. She reports that she does not drink alcohol and does not use drugs.   Family History:  Her family history includes Breast cancer in her sister; Kidney disease in her mother.   Allergies No Known Allergies   Home Medications  Prior to Admission medications   Medication Sig Start Date End Date Taking? Authorizing Provider  MOUNJARO 10 MG/0.5ML Pen Inject 10 mg into the skin once a week. 03/27/23  Yes [provider]  albuterol (VENTOLIN HFA) 108 (90 Base) MCG/ACT inhaler Inhale 2 puffs into the lungs every 4  (four) hours as needed for wheezing or shortness of breath.    [provider]  allopurinol (ZYLOPRIM) 100 MG tablet Take 100 mg by mouth every morning.    [provider]  amLODipine (NORVASC) 5 MG tablet Take 5 mg by mouth every morning. 07/23/20   [provider]  atorvastatin (LIPITOR) 40 MG tablet Take 40 mg by mouth every morning.    [provider]  benazepril (LOTENSIN) 40 MG tablet Take 40 mg by mouth every morning.    [provider]  chlorhexidine (PERIDEX) 0.12 % solution Use as directed 15 mLs in the mouth or throat as needed.    [provider]  ELIQUIS 5 MG TABS tablet Take 5 mg by mouth 2 (two) times daily.    [provider]  fluticasone (FLONASE) 50 MCG/ACT nasal spray Place 2 sprays into both nostrils daily as needed for allergies. 03/31/20   [provider]  fluticasone-salmeterol (ADVAIR HFA) 161-09 MCG/ACT inhaler Inhale 2 puffs by mouth twice daily 07/07/21   Erin Fulling, MD  furosemide (LASIX) 80 MG tablet Take 80 mg by mouth every morning.    [provider]  gabapentin (NEURONTIN) 600 MG tablet Take 600 mg by mouth 3 (three) times daily.    [provider]  hydrochlorothiazide (HYDRODIURIL) 25 MG tablet Take 25 mg by mouth every morning.    [provider]  HYDROcodone-acetaminophen (NORCO) 5-325 MG tablet Take 1 tablet by mouth every 6 (six) hours as needed for moderate pain. 08/15/20   Kennedy Bucker, MD  ibuprofen (ADVIL) 800 MG tablet Take 800 mg by mouth every 8 (  eight) hours as needed for pain. 07/21/20   [provider]  indomethacin (INDOCIN) 25 MG capsule Take 25 mg by mouth daily.    [provider]  LANTUS SOLOSTAR 100 UNIT/ML Solostar Pen Inject 80 Units into the skin every morning. 05/16/20   [provider]  levothyroxine (SYNTHROID) 200 MCG tablet Take 200 mcg by mouth daily before breakfast.    [provider]  Melatonin 10 MG TABS  Take 10 mg by mouth at bedtime. Patient not taking: Reported on 08/15/2020    [provider]  methocarbamol (ROBAXIN) 500 MG tablet Take 1-2 tablets by mouth every 6 (six) hours as needed for muscle spasms. 07/18/20   [provider]  Metoprolol Tartrate 75 MG TABS Take 1 tablet by mouth 2 (two) times daily.    [provider]  montelukast (SINGULAIR) 10 MG tablet Take 10 mg by mouth every morning.    [provider]  [facility] powder Apply 1 Application topically 2 (two) times daily.    [provider]  olopatadine (PATANOL) 0.1 % ophthalmic solution Place 1 drop into both eyes daily.    [provider]  OXYGEN Inhale 3-4 L into the lungs daily.    [provider]  promethazine (PHENERGAN) 25 MG tablet Take 25 mg by mouth every 6 (six) hours as needed for nausea or vomiting.    [provider]  psyllium (REGULOID) 0.52 g capsule Take 0.52 g by mouth 2 (two) times daily.    [provider]  Tiotropium Bromide Monohydrate (SPIRIVA RESPIMAT) 1.25 MCG/ACT AERS Inhale 2 puffs into the lungs daily for 30 doses. 10/03/20 11/02/20  Erin Fulling, MD  tiZANidine (ZANAFLEX) 4 MG tablet Take 4 mg by mouth at bedtime.    [provider]  tretinoin (RETIN-A) 0.05 % cream Apply to face qhs, wash off qam 02/18/22   Deirdre Evener, MD  TRULICITY 0.75 MG/0.5ML SOPN Inject 0.75 mg into the skin once a week. wednesday 07/08/20   [provider]  VALERIAN ROOT PO Take 1,200 mg by mouth at bedtime.    [provider]     Critical care time: 40 minutes     Harlon Ditty, AGACNP-BC [facility] Prefer epic messenger for cross cover needs If after hours, please call E-link

## 2023-05-02 ENCOUNTER — Ambulatory Visit: Payer: 59

## 2023-05-02 DIAGNOSIS — R569 Unspecified convulsions: Secondary | ICD-10-CM

## 2023-05-02 DIAGNOSIS — G9341 Metabolic encephalopathy: Secondary | ICD-10-CM | POA: Diagnosis not present

## 2023-05-02 DIAGNOSIS — I4892 Unspecified atrial flutter: Secondary | ICD-10-CM | POA: Diagnosis not present

## 2023-05-02 LAB — CBC
HCT: 26.8 % — ABNORMAL LOW (ref 36.0–46.0)
Hemoglobin: 9 g/dL — ABNORMAL LOW (ref 12.0–15.0)
MCH: 30.3 pg (ref 26.0–34.0)
MCHC: 33.6 g/dL (ref 30.0–36.0)
MCV: 90.2 fL (ref 80.0–100.0)
Platelets: 338 10*3/uL (ref 150–400)
RBC: 2.97 MIL/uL — ABNORMAL LOW (ref 3.87–5.11)
RDW: 16.4 % — ABNORMAL HIGH (ref 11.5–15.5)
WBC: 26.1 10*3/uL — ABNORMAL HIGH (ref 4.0–10.5)
nRBC: 0.2 % (ref 0.0–0.2)

## 2023-05-02 LAB — RENAL FUNCTION PANEL
Albumin: 2.3 g/dL — ABNORMAL LOW (ref 3.5–5.0)
Anion gap: 9 (ref 5–15)
BUN: 89 mg/dL — ABNORMAL HIGH (ref 8–23)
CO2: 27 mmol/L (ref 22–32)
Calcium: 9.5 mg/dL (ref 8.9–10.3)
Chloride: 100 mmol/L (ref 98–111)
Creatinine, Ser: 1.27 mg/dL — ABNORMAL HIGH (ref 0.44–1.00)
GFR, Estimated: 46 mL/min — ABNORMAL LOW (ref >60)
Glucose, Bld: 167 mg/dL — ABNORMAL HIGH (ref 70–99)
Phosphorus: 3.6 mg/dL (ref 2.5–4.6)
Potassium: 3.7 mmol/L (ref 3.5–5.1)
Sodium: 136 mmol/L (ref 135–145)

## 2023-05-02 LAB — HEPARIN LEVEL (UNFRACTIONATED): Heparin Unfractionated: 0.39 [IU]/mL (ref 0.30–0.70)

## 2023-05-02 LAB — GLUCOSE, CAPILLARY
Glucose-Capillary: 118 mg/dL — ABNORMAL HIGH (ref 70–99)
Glucose-Capillary: 140 mg/dL — ABNORMAL HIGH (ref 70–99)
Glucose-Capillary: 161 mg/dL — ABNORMAL HIGH (ref 70–99)
Glucose-Capillary: 162 mg/dL — ABNORMAL HIGH (ref 70–99)
Glucose-Capillary: 167 mg/dL — ABNORMAL HIGH (ref 70–99)
Glucose-Capillary: 198 mg/dL — ABNORMAL HIGH (ref 70–99)

## 2023-05-02 LAB — MAGNESIUM: Magnesium: 1.7 mg/dL (ref 1.7–2.4)

## 2023-05-02 MED ORDER — NOREPINEPHRINE 4 MG/250ML-% IV SOLN
0.0000 ug/min | INTRAVENOUS | Status: DC
  Administered 2023-05-02 – 2023-05-03 (×2): 2 ug/min via INTRAVENOUS
  Filled 2023-05-02 (×2): qty 250

## 2023-05-02 MED ORDER — MAGNESIUM SULFATE 2 GM/50ML IV SOLN
2.0000 g | Freq: Once | INTRAVENOUS | Status: AC
  Administered 2023-05-02: 2 g via INTRAVENOUS
  Filled 2023-05-02: qty 50

## 2023-05-02 NOTE — Plan of Care (Signed)
  Problem: Clinical Measurements: Goal: Will remain free from infection Outcome: Progressing Goal: Diagnostic test results will improve Outcome: Progressing   Problem: Nutrition: Goal: Adequate nutrition will be maintained Outcome: Progressing   Problem: Elimination: Goal: Will not experience complications related to bowel motility Outcome: Progressing Goal: Will not experience complications related to urinary retention Outcome: Progressing   Problem: Pain Managment: Goal: General experience of comfort will improve and/or be controlled Outcome: Progressing   Problem: Skin Integrity: Goal: Risk for impaired skin integrity will decrease Outcome: Progressing   Problem: Fluid Volume: Goal: Ability to maintain a balanced intake and output will improve Outcome: Progressing

## 2023-05-02 NOTE — Consult Note (Signed)
 PHARMACY - ANTICOAGULATION CONSULT NOTE  Pharmacy Consult for Heparin Infusion Indication: atrial fibrillation  No Known Allergies  Patient Measurements: Height: 5' 7.99" (172.7 cm) Weight: 113.2 kg (249 lb 9 oz) IBW/kg (Calculated) : 63.88 Heparin Dosing Weight: 88.8 kg  Vital Signs: Temp: 98.5 F (36.9 C) (02/20 2000) Temp Source: Axillary (02/21 0400) BP: 97/51 (02/21 0500) Pulse Rate: 75 (02/21 0500)  Labs: Recent Labs    04/30/23 0406 05/01/23 0403 05/02/23 0444  HGB 9.7* 10.1* 9.0*  HCT 28.8* 30.0* 26.8*  PLT 302 331 338  HEPARINUNFRC 0.42 0.39 0.39  CREATININE 1.45* 1.32* 1.27*   Estimated Creatinine Clearance: 55.2 mL/min (A) (by C-G formula based on SCr of 1.27 mg/dL (H)).  Medical History: Past Medical History:  Diagnosis Date   Arthritis    Asthma    Cataract of both eyes    Complication of anesthesia    woke up during surgery x1   COPD (chronic obstructive pulmonary disease) (HCC)    Coronary artery disease    DDD (degenerative disc disease), lumbar    Diabetes mellitus without complication (HCC)    Dyspnea    DUE TO COPD   Hernia of abdominal wall    History of hiatal hernia    small   Hyperlipidemia    Hypertension    Hypothyroidism    hashimotos throiditis  -thyroidectomy  2000   Occasional tremors    Oxygen desaturation    NOW PT ON 3-4 LITERS  DAILY   Medications:  Medications Prior to Admission  Medication Sig Dispense Refill Last Dose/Taking   albuterol (VENTOLIN HFA) 108 (90 Base) MCG/ACT inhaler Inhale 2 puffs into the lungs every 4 (four) hours as needed for wheezing or shortness of breath.   Taking As Needed   allopurinol (ZYLOPRIM) 100 MG tablet Take 100 mg by mouth every morning.   Past Week   amLODipine (NORVASC) 5 MG tablet Take 5 mg by mouth every morning.   Past Week   atorvastatin (LIPITOR) 40 MG tablet Take 40 mg by mouth every morning.   Past Week   benazepril (LOTENSIN) 40 MG tablet Take 40 mg by mouth every morning.    Past Week   ELIQUIS 5 MG TABS tablet Take 5 mg by mouth 2 (two) times daily.   Past Week   fluticasone (FLONASE) 50 MCG/ACT nasal spray Place 2 sprays into both nostrils daily as needed for allergies.   Past Week   fluticasone-salmeterol (ADVAIR HFA) 115-21 MCG/ACT inhaler Inhale 2 puffs by mouth twice daily 12 g 0 Past Week   furosemide (LASIX) 80 MG tablet Take 80 mg by mouth every morning.   Past Week   gabapentin (NEURONTIN) 600 MG tablet Take 600 mg by mouth 3 (three) times daily.   Past Week   hydrochlorothiazide (HYDRODIURIL) 25 MG tablet Take 25 mg by mouth every morning.   Past Week   indomethacin (INDOCIN) 25 MG capsule Take 25 mg by mouth 3 (three) times daily with meals.   Past Week   levocetirizine (XYZAL) 5 MG tablet Take 5 mg by mouth every evening.   Past Week   levothyroxine (SYNTHROID) 175 MCG tablet Take 175 mcg by mouth daily before breakfast.   Past Week   methocarbamol (ROBAXIN) 500 MG tablet Take 1-2 tablets by mouth every 6 (six) hours as needed for muscle spasms.   Past Week   Metoprolol Tartrate 75 MG TABS Take 1 tablet by mouth 2 (two) times daily.   Past Week  montelukast (SINGULAIR) 10 MG tablet Take 10 mg by mouth every morning.   Past Week   MOUNJARO 10 MG/0.5ML Pen Inject 10 mg into the skin once a week.   Past Week   [facility] powder Apply 1 Application topically 2 (two) times daily.   Past Week   promethazine (PHENERGAN) 25 MG tablet Take 25 mg by mouth every 6 (six) hours as needed for nausea or vomiting.   Taking As Needed   Tiotropium Bromide Monohydrate (SPIRIVA RESPIMAT) 1.25 MCG/ACT AERS Inhale 2 puffs into the lungs daily for 30 doses. 3 each 0 Past Week   tiZANidine (ZANAFLEX) 4 MG tablet Take 4 mg by mouth 3 (three) times daily.   Past Week   tretinoin (RETIN-A) 0.05 % cream Apply to face qhs, wash off qam 45 g 11 Past Week   OXYGEN Inhale 3-4 L into the lungs daily.      Pertinent Medications: PTA apixaban, last dose unknown  Last dose heparin sq  2/15 @0522 .   Assessment: 68 year old female with a past medical history significant for COPD on home supplemental oxygen and diabetes mellitus who presented to [facility] ED on 04/24/2023 due to unresponsiveness. PMH of afib on apixaban. CHADSVASc 3. Started on CRRT 2/15. Pharmacy was consulted to dose and manage this patient's heparin.   Date Time aPTT/HL Rate/Comment  2/15 1947 45/---  1200/SUBtherapeutic 2/16 0552 72/0.81 1500/aPTT therapeutic  2/16    1336     67/--                1500/aPTT therapeutic    2/16 2115 62/--  1500/aPTT SUBtherapeutic 2/17 0616 69/0.65 1650/both levels therapeutic x 1 2/17 1352 74/0.61 1650/both levels therapeutic x 2 2/18 0415 -- / 0.62 1650/therapeutic x 3 2/19 0406 -- / 0.42 1650/therapeutic x 4 2/20 0403 -- / 0.39 1650/therapeutic x 5 2/21 0444 -- / 0.39 1650/therapeutic x 6  Baseline Labs:  Hgb 9.5 Plt 325 INR/PT 1.1/14.3  HL 0.67 - possibly has some false elevated due to apixaban.   Goal of Therapy:  Heparin level 0.3-0.7 units/ml aPTT 66-102 seconds Monitor platelets by anticoagulation protocol: Yes  Plan:  Continue heparin infusion at 1650 units/hr Recheck HL daily w/ AM labs while therapeutic Monitor CBC and HL daily while on heparin   Otelia Sergeant, PharmD, [facility] 05/02/2023 5:57 AM

## 2023-05-02 NOTE — Progress Notes (Signed)
 Eeg done

## 2023-05-02 NOTE — Consult Note (Addendum)
 PHARMACY CONSULT NOTE - ELECTROLYTES  Pharmacy Consult for Electrolyte Monitoring and Replacement   Recent Labs:  Height: 5' 7.99" (172.7 cm) Weight: 113.2 kg (249 lb 9 oz) IBW/kg (Calculated) : 63.88 Estimated Creatinine Clearance: 55.2 mL/min (A) (by C-G formula based on SCr of 1.27 mg/dL (H)). Potassium (mmol/L)  Date Value  05/02/2023 3.7  11/16/2013 3.5   Magnesium (mg/dL)  Date Value  16/12/9602 1.7  11/16/2013 1.0 (L)   Calcium (mg/dL)  Date Value  54/11/8117 9.5   Calcium, Total (mg/dL)  Date Value  14/78/2956 9.1   Albumin (g/dL)  Date Value  21/30/8657 2.3 (L)  11/16/2013 3.8   Phosphorus (mg/dL)  Date Value  84/69/6295 3.6   Sodium (mmol/L)  Date Value  05/02/2023 136  11/16/2013 133 (L)    Assessment  Sarah Norton is a 68 y.o. female presenting with respiratory failure and unresponsive. PMH significant for DM and COPD. Patient was started on CRRT 2/15 then taken off evening of 2/18. They continue to have good urine output. Nephrology has signed off. Pharmacy has been consulted to monitor and replace electrolytes while they're in the ICU.   Diet: npo Nutrition: Vital 50 mL/hr Pertinent medications: free water 30 ml q4H  Goal of Therapy: Electrolytes WNL Today, 05/02/2023 K = 3.7 Mg = 1.7 Phos = 3.6 Na = 136  Plan:  Give magnesium sulfate 2g IV x 1 No other replacement needed  Continue to monitor with AM labs.   Thank you for allowing pharmacy to be a part of this patient's care.  Effie Shy, PharmD Pharmacy Resident  05/02/2023 10:42 AM

## 2023-05-02 NOTE — Procedures (Signed)
 Patient Name: Sarah Norton  MRN: [id number]  Epilepsy Attending: Charlsie Quest  Referring Physician/Provider: Judithe Modest, NP  Date: 05/02/2023 Duration: 29.40 mins  Patient history: 68yo F with ams. EEG to evaluate for seizure  Level of alertness: comatose/ lethargic   AEDs during EEG study: Propofol  Technical aspects: This EEG study was done with scalp electrodes positioned according to the 10-20 International system of electrode placement. Electrical activity was reviewed with band pass filter of 1-70Hz , sensitivity of 7 uV/mm, display speed of 61mm/sec with a 60Hz  notched filter applied as appropriate. EEG data were recorded continuously and digitally stored.  Video monitoring was available and reviewed as appropriate.  Description: EEG showed continuous generalized predominantly 5 to 7 Hz theta-alpha activity. Intermittent generalized 2-3hz  delta slowing was also noted admixed with 13-15hz  beta activity. Hyperventilation and photic stimulation were not performed.     ABNORMALITY - Continuous slow, generalized  IMPRESSION: This study is suggestive of moderate diffuse encephalopathy. No seizures or epileptiform discharges were seen throughout the recording.  Mclane Arora Annabelle Harman

## 2023-05-02 NOTE — Progress Notes (Signed)
 NAME:  Sarah Norton, MRN:  [id number], DOB:  10-19-1955, LOS: 8 ADMISSION DATE:  04/24/2023, CONSULTATION DATE:  04/24/23 REFERRING MD:  Dr. Rosalia Hammers, CHIEF COMPLAINT:  Acute Respiratory Distress, hypoxia, AMS   Brief Pt Description / Synopsis:  68 y.o female admitted with Acute Metabolic Encephalopathy and Acute Hypoxic and Hypercapnic Respiratory Failure in the setting of Acute COPD Exacerbation due to Influenza A infection and questionable superimposed bacterial pneumonia, failed trial of BiPAP requiring intubation and mechanical ventilation.  Course complicated by AKI requiring initiation of CRRT.  History of Present Illness:  Sarah Norton is a 68 year old female with a past medical history significant for COPD on home supplemental oxygen and diabetes mellitus who presented to [facility] ED on 04/24/2023 due to unresponsiveness.  Upon EMS arrival she was noted to be hypoxic with O2 saturations in the 40s for which she was placed on CPAP with improvement in sats to the 80s.  Upon arrival to the ED she remained somnolent but slightly arousable, and given her hypoxia, she was transitioned to BiPAP.  Pt is currently intubated and sedated, and no other history is currently available.   ED Course: Initial Vital Signs: Temperature 96.5 F axillary, pulse 87, respiratory rate 33, blood pressure 136/97, SpO2 78% Significant Labs: Sodium 134, glucose 313, BUN 59, creatinine 1.27, BNP 436, high-sensitivity troponin 139, lactic acid 1.7, WBC 33.4, D-dimer 2.13 VBG: pH 7.19/pCO2 82/pO2 39/bicarb 31.3 Positive for influenza A Urinalysis negative for UTI Imaging Chest X-ray>>IMPRESSION: *Bibasilar heterogeneous opacities, favored to represent multilobar pneumonia. Follow-up to clearing is recommended. CTa Chest>> pending currently Medications Administered: 1 L normal saline bolus, 125 mg Solu-Medrol, IV azithromycin and ceftriaxone, Levophed drip initiated  Despite BiPAP patient's ABG worsened with worsening mental  status.  Therefore ED provider elected to intubate.  PCCM is asked to admit for further workup and treatment.  Please see "significant hospital events" section below for full detailed hospital course.   Pertinent  Medical History   Past Medical History:  Diagnosis Date   Arthritis    Asthma    Cataract of both eyes    Complication of anesthesia    woke up during surgery x1   COPD (chronic obstructive pulmonary disease) (HCC)    Coronary artery disease    DDD (degenerative disc disease), lumbar    Diabetes mellitus without complication (HCC)    Dyspnea    DUE TO COPD   Hernia of abdominal wall    History of hiatal hernia    small   Hyperlipidemia    Hypertension    Hypothyroidism    hashimotos throiditis  -thyroidectomy  2000   Occasional tremors    Oxygen desaturation    NOW PT ON 3-4 LITERS [place] DAILY    Micro Data:  2/13: COVID/FLU/RSV PCR>> + Influenza A 2/13: Blood culture x2>> no growth 2/13: Strep pneumo urinary antigen>> negative 2/13: Legionella urinary antigen>> negative 2/13: HIV screen>>negative 2/13: MRSA PCR>> negative 2/15: Tracheal aspirate>> no growth  Antimicrobials:   Anti-infectives (From admission, onward)    Start     Dose/Rate Route Frequency Ordered Stop   04/30/23 2200  ceFEPIme (MAXIPIME) 1 g in sodium chloride 0.9 % 100 mL IVPB  Status:  Discontinued        1 g 200 mL/hr over 30 Minutes Intravenous Every 24 hours 04/29/23 1842 04/30/23 1006   04/29/23 1145  linezolid (ZYVOX) tablet 600 mg  Status:  Discontinued        600 mg Per Tube Every  12 hours 04/29/23 1052 04/30/23 1006   04/28/23 1015  vancomycin (VANCOREADY) IVPB 1250 mg/250 mL        1,250 mg 166.7 mL/hr over 90 Minutes Intravenous  Once 04/28/23 0921 04/28/23 1209   04/28/23 1000  oseltamivir (TAMIFLU) 6 MG/ML suspension 30 mg  Status:  Discontinued        30 mg Per Tube Daily 04/28/23 0737 04/28/23 0747   04/28/23 1000  oseltamivir (TAMIFLU) 6 MG/ML suspension 75 mg        75  mg Per Tube Daily 04/28/23 0747 04/29/23 1155   04/27/23 1000  oseltamivir (TAMIFLU) 6 MG/ML suspension 75 mg  Status:  Discontinued        75 mg Per Tube Daily 04/26/23 1834 04/28/23 0737   04/27/23 0815  vancomycin (VANCOCIN) IVPB 1000 mg/200 mL premix        1,000 mg 200 mL/hr over 60 Minutes Intravenous  Once 04/27/23 0724 04/27/23 0921   04/26/23 2200  ceFEPIme (MAXIPIME) 2 g in sodium chloride 0.9 % 100 mL IVPB  Status:  Discontinued        2 g 200 mL/hr over 30 Minutes Intravenous Every 12 hours 04/26/23 1834 04/29/23 1842   04/26/23 1000  oseltamivir (TAMIFLU) 6 MG/ML suspension 30 mg  Status:  Discontinued        30 mg Per Tube Daily 04/25/23 1017 04/26/23 1834   04/25/23 2000  vancomycin (VANCOREADY) IVPB 1250 mg/250 mL  Status:  Discontinued        1,250 mg 166.7 mL/hr over 90 Minutes Intravenous Every 24 hours 04/24/23 2106 04/25/23 1151   04/25/23 1300  ceFEPIme (MAXIPIME) 2 g in sodium chloride 0.9 % 100 mL IVPB  Status:  Discontinued        2 g 200 mL/hr over 30 Minutes Intravenous Every 24 hours 04/25/23 1157 04/26/23 1834   04/25/23 1200  azithromycin (ZITHROMAX) 500 mg in sodium chloride 0.9 % 250 mL IVPB        500 mg 250 mL/hr over 60 Minutes Intravenous Every 24 hours 04/24/23 1557 04/28/23 1312   04/25/23 1150  vancomycin variable dose per unstable renal function (pharmacist dosing)  Status:  Discontinued         Does not apply See admin instructions 04/25/23 1151 04/29/23 1052   04/25/23 1100  cefTRIAXone (ROCEPHIN) 2 g in sodium chloride 0.9 % 100 mL IVPB  Status:  Discontinued        2 g 200 mL/hr over 30 Minutes Intravenous Every 24 hours 04/24/23 1557 04/25/23 1021   04/24/23 2200  oseltamivir (TAMIFLU) 6 MG/ML suspension 30 mg  Status:  Discontinued        30 mg Per Tube 2 times daily 04/24/23 1559 04/25/23 1017   04/24/23 2045  vancomycin (VANCOREADY) IVPB 2000 mg/400 mL        2,000 mg 200 mL/hr over 120 Minutes Intravenous  Once 04/24/23 2034 04/25/23 0045    04/24/23 1245  cefTRIAXone (ROCEPHIN) 2 g in sodium chloride 0.9 % 100 mL IVPB        2 g 200 mL/hr over 30 Minutes Intravenous Once 04/24/23 1244 04/24/23 1407   04/24/23 1245  azithromycin (ZITHROMAX) 500 mg in sodium chloride 0.9 % 250 mL IVPB        500 mg 250 mL/hr over 60 Minutes Intravenous  Once 04/24/23 1244 04/24/23 1513       Significant Hospital Events: Including procedures, antibiotic start and stop dates in addition to other pertinent  events   2/13: Presented to ED with AMS, acute respiratory distress and hypoxia.  Initially trialed on BiPAP but with worsening ABG and mental status.  ED provider intubated.  PCCM asked to admit. 04/25/2023 - Patient with improving respiratory status however with worsening wbc and procalcitonin.  04/26/2023 - HD cath placed and started on CRRT. 2/17 remains on vent, on CRRT 02/18: CRRT discontinued  02/19: On minimal vent settings.  Plan for WUA/SBT as tolerated utilizing Precedex.  With SBT, increased RR/WOB/accessory muscle use.  Adequate urine output and electrolytes acceptable, no plan for HD today. 02/20: Remains on minimal vent support, SBT as tolerated. On WUA with Precedex, she opens eyes but not following commands,  MRI Brain negative for acute intracranial abnormality. Faild SBT due to tachypnea (RR 40's) and increased WOB 02/21: No significant events noted overnight.  Afebrile, hemodynamically stable, not requiring vasopressors. Leukocytosis improving. On minimal vent support, plan for WUA and SBT as tolerated ~ failed SBT due to tachypnea (RR 40's) and increased WOB.  Not following commands on WUA, will obtain EEG.  Interim History / Subjective:  As outlined above in "significant hospital events" section  Objective   Blood pressure (!) 106/56, pulse 74, temperature 98.7 F (37.1 C), temperature source Axillary, resp. rate 19, height 5' 7.99" (1.727 m), weight 113.2 kg, SpO2 96%. CVP:  [10 mmHg-15 mmHg] 10 mmHg  Vent Mode:  PRVC FiO2 (%):  [35 %] 35 % Set Rate:  [22 bmp] 22 bmp Vt Set:  [540 mL] 540 mL PEEP:  [8 cmH20] 8 cmH20 Pressure Support:  [10 cmH20] 10 cmH20 Plateau Pressure:  [18 cmH20-20 cmH20] 20 cmH20   Intake/Output Summary (Last 24 hours) at 05/02/2023 2130 Last data filed at 05/02/2023 8657 Gross per 24 hour  Intake 2892.8 ml  Output 7100 ml  Net -4207.2 ml   Filed Weights   04/30/23 0310 05/01/23 0330 05/02/23 0500  Weight: 114.8 kg 118 kg 113.2 kg    Examination: General: Critically ill-appearing female, laying in bed, intubated and sedated, no acute distress HENT: Atraumatic, normocephalic, neck supple, difficult to assess JVD due to body habitus and bilateral internal jugular central line/HD catheters.  orally intubated Lungs: Coarse throughout, even, nonlabored, synchronous with the vent Cardiovascular: Irregularly irregular rhythm, rate controlled, no murmurs, rubs, gallops Abdomen: Obese, soft, nontender, nondistended, no guarding or rebound tenderness, bowel sounds positive x 4 Extremities: Normal bulk and tone, no deformities, 1+edema to bilateral lower extremities Neuro: Sedated on Fentanyl and Propofol currently,withdraws from pain, but currently not following commands, pupils PERRLA at 3 mm bilaterally GU: Foley catheter in place  Resolved Hospital Problem list     Assessment & Plan:   #Acute Hypoxic & Hypercapnic Respiratory Failure in the setting of AECOPD, Influenza infection, and questionable superimposed bacterial pneumonia CTa Chest on 2/13: negative for PE, AAA or dissection. Concerning for interstitial and alveolar edema vs pneumonitis, and bibasilar consolidation (R >L). -Full vent support, implement lung protective strategies -Plateau pressures less than 30 cm H20 -Wean FiO2 & PEEP as tolerated to maintain O2 sats >92% -Follow intermittent Chest X-ray & ABG as needed -Spontaneous Breathing Trials when respiratory parameters met and mental status  permits -Implement VAP Bundle -Bronchodilators -IV Steroids -Completed course of ABX as above  -Completed course of Tamiflu -Volume removal with CRRT/HD  #Shock: Suspect Septic ~ RESOLVED #Elevated Troponin in setting of demand ischemia  #Atrial Fibrillation with RVR ~ RATE CONTROLLED Echocardiogram 04/25/23: LVEF 60-65%, mild LVH, normal diastolic parameters, RV systolic function  is normal, moderately elevated pulmonary artery systolic pressure, mild to moderate AS -Continuous cardiac monitoring -Maintain MAP >65 -IV fluids -Vasopressors as needed to maintain MAP goal ~ currently weaned off -Lactic acid is normalized -HS Troponin peaked at 139 -Diuresis as BP and renal function permits  -Continue Heparin gtt: Dosing per Pharmacy  #Meets SIRS Criteria (RR 34, WBC 33.4) #Severe Sepsis in setting of Influenza A infection and questionable superimposed bacterial pneumonia ~ TREATED  -Monitor fever curve -Trend WBC's -Follow cultures as above -Complete course of Cefepime and Linezolid on 2/19  #Acute Kidney Injury on CKD Stage IIIa ~ IMPROVING #Mild Hyponatremia ~ RESOLVED -Monitor I&O's / urinary output -Follow BMP -Ensure adequate renal perfusion -Avoid nephrotoxic agents as able -Replace electrolytes as indicated ~ Pharmacy following for assistance with electrolyte replacement -Nephrology following, appreciate input ~ CRRT vs HD as per Nephrology  #Diabetes Mellitus -CBG's q4h; Target range of 140 to 180 -SSI -Follow ICU Hypo/Hyperglycemia protocol  #Acute Metabolic Encephalopathy, suspect CO2 Narcosis #Sedation needs in setting of mechanical ventilation -CT Head on 2/13 negative for acute intracranial process -MRI Brain on 2/20 negative for acute intracranial process -Treatment of metabolic derangements, sepsis, and hypercapnia as outlined above -Maintain a RASS goal of 0 to -1 -Fentanyl and Propofol as needed to maintain RASS goal -Avoid sedating medications as  able -Daily wake up assessment ~ utilize Precedex for WUA -Will Obtain EEG given persistent encephalopathy to rule out subclinical seizures     Patient is critically ill with severe acute hypoxic and hypercapnic respiratory failure requiring mechanical ventilation.  Given her underlying COPD at baseline, suspect she will be difficult to liberate from mechanical ventilation.    Best Practice (right click and "Reselect all SmartList Selections" daily)   Diet/type: Tube feeds DVT prophylaxis: Heparin gtt GI prophylaxis: PPI Lines: HD catheter, still needed Foley:  Yes, and it is still needed Code Status:  full code Last date of multidisciplinary goals of care discussion [2/21]  2/21: Will update pt's family when they arrive at bedside on plan of care.  Labs   CBC: Recent Labs  Lab 04/28/23 0309 04/29/23 0415 04/30/23 0406 05/01/23 0403 05/02/23 0444  WBC 31.3* 26.2* 29.4* 34.6* 26.1*  HGB 10.0* 9.7* 9.7* 10.1* 9.0*  HCT 30.3* 28.5* 28.8* 30.0* 26.8*  MCV 91.3 89.3 90.3 89.8 90.2  PLT 320 280 302 331 338    Basic Metabolic Panel: Recent Labs  Lab 04/28/23 0309 04/28/23 1551 04/29/23 0415 04/29/23 1543 04/30/23 0406 05/01/23 0403 05/02/23 0444  NA 134*   < > 131* 134* 133* 136 136  K 4.8   < > 4.4 3.6 3.6 3.8 3.7  CL 101   < > 98 101 99 100 100  CO2 23   < > 24 26 25 26 27   GLUCOSE 249*   < > 235* 155* 155* 189* 167*  BUN 48*   < > 38* 37* 51* 73* 89*  CREATININE 2.43*   < > 1.31* 1.01* 1.45* 1.32* 1.27*  CALCIUM 7.8*   < > 8.7* 8.8* 9.3 9.5 9.5  MG 2.4  --  2.3  --  2.0 2.0 1.7  PHOS 2.9   < > 2.7 2.3* 2.5 2.9 3.6   < > = values in this interval not displayed.   GFR: Estimated Creatinine Clearance: 55.2 mL/min (A) (by C-G formula based on SCr of 1.27 mg/dL (H)). Recent Labs  Lab 04/26/23 0357 04/27/23 0424 04/29/23 0415 04/30/23 0406 05/01/23 0403 05/02/23 0444  PROCALCITON 46.05  --   --   --   --   --  WBC 46.7*   < > 26.2* 29.4* 34.6* 26.1*   <  > = values in this interval not displayed.    Liver Function Tests: Recent Labs  Lab 04/26/23 0731 04/27/23 0424 04/29/23 0415 04/29/23 1543 04/30/23 0406 05/01/23 0403 05/02/23 0444  AST 26  --   --   --   --   --   --   ALT 20  --   --   --   --   --   --   ALKPHOS 75  --   --   --   --   --   --   BILITOT 1.2  --   --   --   --   --   --   PROT 6.2*  --   --   --   --   --   --   ALBUMIN 2.2*   < > 2.3* 2.0* 2.2* 2.4* 2.3*   < > = values in this interval not displayed.   No results for input(s): "LIPASE", "AMYLASE" in the last 168 hours. No results for input(s): "AMMONIA" in the last 168 hours.  ABG    Component Value Date/Time   PHART 7.38 04/29/2023 1202   PCO2ART 46 04/29/2023 1202   PO2ART 83 04/29/2023 1202   HCO3 27.2 04/29/2023 1202   ACIDBASEDEF 3.2 (H) 04/27/2023 1551   O2SAT 98.9 04/29/2023 1202     Coagulation Profile: No results for input(s): "INR", "PROTIME" in the last 168 hours.   Cardiac Enzymes: No results for input(s): "CKTOTAL", "CKMB", "CKMBINDEX", "TROPONINI" in the last 168 hours.  HbA1C: Hemoglobin A1C  Date/Time Value Ref Range Status  11/16/2013 04:11 PM 6.5 (H) 4.2 - 6.3 % Final    Comment:    The American Diabetes Association recommends that a primary goal of therapy should be <7% and that physicians should reevaluate the treatment regimen in patients with HbA1c values consistently >8%.    Hgb A1c MFr Bld  Date/Time Value Ref Range Status  04/25/2023 04:11 AM 6.5 (H) 4.8 - 5.6 % Final    Comment:    (NOTE) Pre diabetes:          5.7%-6.4%  Diabetes:              >6.4%  Glycemic control for   <7.0% adults with diabetes   05/04/2020 04:33 AM 8.0 (H) 4.8 - 5.6 % Final    Comment:    (NOTE) Pre diabetes:          5.7%-6.4%  Diabetes:              >6.4%  Glycemic control for   <7.0% adults with diabetes     CBG: Recent Labs  Lab 05/01/23 1604 05/01/23 1948 05/01/23 2334 05/02/23 0340 05/02/23 0725  GLUCAP  226* 187* 151* 161* 162*    Review of Systems:   Unable to assess due to AMS/intubation/sedation   Past Medical History:  She,  has a past medical history of Arthritis, Asthma, Cataract of both eyes, Complication of anesthesia, COPD (chronic obstructive pulmonary disease) (HCC), Coronary artery disease, DDD (degenerative disc disease), lumbar, Diabetes mellitus without complication (HCC), Dyspnea, Hernia of abdominal wall, History of hiatal hernia, Hyperlipidemia, Hypertension, Hypothyroidism, Occasional tremors, and Oxygen desaturation.   Surgical History:   Past Surgical History:  Procedure Laterality Date   ABDOMINAL HYSTERECTOMY     CHOLECYSTECTOMY     COLONOSCOPY WITH PROPOFOL N/A 09/08/2014   Procedure: COLONOSCOPY WITH PROPOFOL;  Surgeon:  Midge Minium, MD;  Location: [facility] ENDOSCOPY;  Service: Endoscopy;  Laterality: N/A;   GANGLION CYST EXCISION Right 08/15/2020   Procedure: Right index finger cyst removal;  Surgeon: Kennedy Bucker, MD;  Location: [facility] ORS;  Service: Orthopedics;  Laterality: Right;   HIATAL HERNIA REPAIR  2000   OTHER SURGICAL HISTORY     vocal cord surgery due to injury during thyroid surgery   parathyroidectomy  2000   THYROIDECTOMY     TONSILLECTOMY     TUMOR REMOVAL Right Leg     Social History:   reports that she quit smoking about 7 years ago. Her smoking use included cigarettes. She started smoking about 55 years ago. She has a 48 pack-year smoking history. She has never used smokeless tobacco. She reports that she does not drink alcohol and does not use drugs.   Family History:  Her family history includes Breast cancer in her sister; Kidney disease in her mother.   Allergies No Known Allergies   Home Medications  Prior to Admission medications   Medication Sig Start Date End Date Taking? Authorizing Provider  MOUNJARO 10 MG/0.5ML Pen Inject 10 mg into the skin once a week. 03/27/23  Yes [provider]  albuterol (VENTOLIN HFA) 108 (90  Base) MCG/ACT inhaler Inhale 2 puffs into the lungs every 4 (four) hours as needed for wheezing or shortness of breath.    [provider]  allopurinol (ZYLOPRIM) 100 MG tablet Take 100 mg by mouth every morning.    [provider]  amLODipine (NORVASC) 5 MG tablet Take 5 mg by mouth every morning. 07/23/20   [provider]  atorvastatin (LIPITOR) 40 MG tablet Take 40 mg by mouth every morning.    [provider]  benazepril (LOTENSIN) 40 MG tablet Take 40 mg by mouth every morning.    [provider]  chlorhexidine (PERIDEX) 0.12 % solution Use as directed 15 mLs in the mouth or throat as needed.    [provider]  ELIQUIS 5 MG TABS tablet Take 5 mg by mouth 2 (two) times daily.    [provider]  fluticasone (FLONASE) 50 MCG/ACT nasal spray Place 2 sprays into both nostrils daily as needed for allergies. 03/31/20   [provider]  fluticasone-salmeterol (ADVAIR HFA) 347-42 MCG/ACT inhaler Inhale 2 puffs by mouth twice daily 07/07/21   Erin Fulling, MD  furosemide (LASIX) 80 MG tablet Take 80 mg by mouth every morning.    [provider]  gabapentin (NEURONTIN) 600 MG tablet Take 600 mg by mouth 3 (three) times daily.    [provider]  hydrochlorothiazide (HYDRODIURIL) 25 MG tablet Take 25 mg by mouth every morning.    [provider]  HYDROcodone-acetaminophen (NORCO) 5-325 MG tablet Take 1 tablet by mouth every 6 (six) hours as needed for moderate pain. 08/15/20   Kennedy Bucker, MD  ibuprofen (ADVIL) 800 MG tablet Take 800 mg by mouth every 8 (eight) hours as needed for pain. 07/21/20   [provider]  indomethacin (INDOCIN) 25 MG capsule Take 25 mg by mouth daily.    [provider]  LANTUS SOLOSTAR 100 UNIT/ML Solostar Pen Inject 80 Units into the skin every morning. 05/16/20   [provider]  levothyroxine (SYNTHROID) 200 MCG tablet Take 200 mcg by mouth daily before  breakfast.    [provider]  Melatonin 10 MG TABS Take 10 mg by mouth at bedtime. Patient not taking: Reported on 08/15/2020    [provider]  methocarbamol (ROBAXIN) 500 MG tablet Take 1-2 tablets by mouth every 6 (six) hours as needed for muscle spasms. 07/18/20   [provider]  Metoprolol Tartrate 75 MG TABS Take 1 tablet by mouth 2 (two) times daily.    [provider]  montelukast (SINGULAIR) 10 MG tablet Take 10 mg by mouth every morning.    [provider]  [facility] powder Apply 1 Application topically 2 (two) times daily.    [provider]  olopatadine (PATANOL) 0.1 % ophthalmic solution Place 1 drop into both eyes daily.    [provider]  OXYGEN Inhale 3-4 L into the lungs daily.    [provider]  promethazine (PHENERGAN) 25 MG tablet Take 25 mg by mouth every 6 (six) hours as needed for nausea or vomiting.    [provider]  psyllium (REGULOID) 0.52 g capsule Take 0.52 g by mouth 2 (two) times daily.    [provider]  Tiotropium Bromide Monohydrate (SPIRIVA RESPIMAT) 1.25 MCG/ACT AERS Inhale 2 puffs into the lungs daily for 30 doses. 10/03/20 11/02/20  Erin Fulling, MD  tiZANidine (ZANAFLEX) 4 MG tablet Take 4 mg by mouth at bedtime.    [provider]  tretinoin (RETIN-A) 0.05 % cream Apply to face qhs, wash off qam 02/18/22   Deirdre Evener, MD  TRULICITY 0.75 MG/0.5ML SOPN Inject 0.75 mg into the skin once a week. wednesday 07/08/20   [provider]  VALERIAN ROOT PO Take 1,200 mg by mouth at bedtime.    [provider]     Critical care time: 40 minutes     Harlon Ditty, AGACNP-BC [facility] Prefer epic messenger for cross cover needs If after hours, please call E-link

## 2023-05-02 NOTE — Progress Notes (Signed)
 RN notified Harlon Ditty NP pt's HR has been sustaining in the 40's.

## 2023-05-03 ENCOUNTER — Inpatient Hospital Stay: Payer: 59

## 2023-05-03 DIAGNOSIS — G9341 Metabolic encephalopathy: Secondary | ICD-10-CM | POA: Diagnosis not present

## 2023-05-03 LAB — GLUCOSE, CAPILLARY
Glucose-Capillary: 187 mg/dL — ABNORMAL HIGH (ref 70–99)
Glucose-Capillary: 203 mg/dL — ABNORMAL HIGH (ref 70–99)
Glucose-Capillary: 202 mg/dL — ABNORMAL HIGH (ref 70–99)
Glucose-Capillary: 273 mg/dL — ABNORMAL HIGH (ref 70–99)
Glucose-Capillary: 215 mg/dL — ABNORMAL HIGH (ref 70–99)

## 2023-05-03 LAB — CBC
HCT: 26.4 % — ABNORMAL LOW (ref 36.0–46.0)
Hemoglobin: 9 g/dL — ABNORMAL LOW (ref 12.0–15.0)
MCH: 30.3 pg (ref 26.0–34.0)
MCHC: 34.1 g/dL (ref 30.0–36.0)
MCV: 88.9 fL (ref 80.0–100.0)
Platelets: 406 10*3/uL — ABNORMAL HIGH (ref 150–400)
RBC: 2.97 MIL/uL — ABNORMAL LOW (ref 3.87–5.11)
RDW: 16.3 % — ABNORMAL HIGH (ref 11.5–15.5)
WBC: 24.5 10*3/uL — ABNORMAL HIGH (ref 4.0–10.5)
nRBC: 0.1 % (ref 0.0–0.2)

## 2023-05-03 LAB — RENAL FUNCTION PANEL
Albumin: 2.5 g/dL — ABNORMAL LOW (ref 3.5–5.0)
Anion gap: 11 (ref 5–15)
BUN: 75 mg/dL — ABNORMAL HIGH (ref 8–23)
CO2: 26 mmol/L (ref 22–32)
Calcium: 9.5 mg/dL (ref 8.9–10.3)
Chloride: 101 mmol/L (ref 98–111)
Creatinine, Ser: 1.08 mg/dL — ABNORMAL HIGH (ref 0.44–1.00)
GFR, Estimated: 56 mL/min — ABNORMAL LOW (ref >60)
Glucose, Bld: 205 mg/dL — ABNORMAL HIGH (ref 70–99)
Phosphorus: 3.4 mg/dL (ref 2.5–4.6)
Potassium: 3.8 mmol/L (ref 3.5–5.1)
Sodium: 138 mmol/L (ref 135–145)

## 2023-05-03 LAB — MAGNESIUM: Magnesium: 1.9 mg/dL (ref 1.7–2.4)

## 2023-05-03 LAB — HEPARIN LEVEL (UNFRACTIONATED): Heparin Unfractionated: 0.39 [IU]/mL (ref 0.30–0.70)

## 2023-05-03 MED ORDER — ALBUMIN HUMAN 25 % IV SOLN
12.5000 g | Freq: Once | INTRAVENOUS | Status: AC
  Administered 2023-05-03: 12.5 g via INTRAVENOUS
  Filled 2023-05-03: qty 50

## 2023-05-03 MED ORDER — FUROSEMIDE 10 MG/ML IJ SOLN
40.0000 mg | Freq: Two times a day (BID) | INTRAMUSCULAR | Status: DC
  Administered 2023-05-03 – 2023-05-05 (×5): 40 mg via INTRAVENOUS
  Filled 2023-05-03 (×5): qty 4

## 2023-05-03 MED ORDER — IPRATROPIUM-ALBUTEROL 0.5-2.5 (3) MG/3ML IN SOLN
3.0000 mL | Freq: Four times a day (QID) | RESPIRATORY_TRACT | Status: DC
  Administered 2023-05-03 – 2023-05-09 (×25): 3 mL via RESPIRATORY_TRACT
  Filled 2023-05-03 (×25): qty 3

## 2023-05-03 NOTE — Plan of Care (Signed)
 Patient continues to be intubated and sedated. Titrated Levophed to keep MAP >65. Tube feeds infusing at goal rate of 50 ml/hr. Foley catheter draining clear yellow urine. Oral care provided with repositioning.  Problem: Education: Goal: Knowledge of General Education information will improve Description: Including pain rating scale, medication(s)/side effects and non-pharmacologic comfort measures Outcome: Not Progressing   Problem: Health Behavior/Discharge Planning: Goal: Ability to manage health-related needs will improve Outcome: Not Progressing   Problem: Clinical Measurements: Goal: Ability to maintain clinical measurements within normal limits will improve Outcome: Progressing Goal: Will remain free from infection Outcome: Not Progressing Goal: Diagnostic test results will improve Outcome: Not Progressing Goal: Respiratory complications will improve Outcome: Not Progressing Goal: Cardiovascular complication will be avoided Outcome: Not Progressing   Problem: Activity: Goal: Risk for activity intolerance will decrease Outcome: Not Progressing   Problem: Nutrition: Goal: Adequate nutrition will be maintained Outcome: Progressing   Problem: Coping: Goal: Level of anxiety will decrease Outcome: Progressing   Problem: Elimination: Goal: Will not experience complications related to bowel motility Outcome: Not Progressing Goal: Will not experience complications related to urinary retention Outcome: Progressing   Problem: Pain Managment: Goal: General experience of comfort will improve and/or be controlled Outcome: Progressing

## 2023-05-03 NOTE — Plan of Care (Signed)
  Problem: Education: Goal: Knowledge of General Education information will improve Description: Including pain rating scale, medication(s)/side effects and non-pharmacologic comfort measures Outcome: Not Progressing   Problem: Health Behavior/Discharge Planning: Goal: Ability to manage health-related needs will improve Outcome: Not Progressing   Problem: Clinical Measurements: Goal: Ability to maintain clinical measurements within normal limits will improve Outcome: Not Progressing   Problem: Coping: Goal: Level of anxiety will decrease Outcome: Not Progressing   

## 2023-05-03 NOTE — Progress Notes (Signed)
 NAME:  QUEENIE AUFIERO, MRN:  [id number], DOB:  05-16-1955, LOS: 9 ADMISSION DATE:  04/24/2023, CONSULTATION DATE:  04/24/23 REFERRING MD:  Dr. Rosalia Hammers, CHIEF COMPLAINT:  Acute Respiratory Distress, hypoxia, AMS   Brief Pt Description / Synopsis:  68 y.o female admitted with Acute Metabolic Encephalopathy and Acute Hypoxic and Hypercapnic Respiratory Failure in the setting of Acute COPD Exacerbation due to Influenza A infection and questionable superimposed bacterial pneumonia, failed trial of BiPAP requiring intubation and mechanical ventilation.  Course complicated by AKI requiring initiation of CRRT.  History of Present Illness:  Martrice Apt is a 68 year old female with a past medical history significant for COPD on home supplemental oxygen and diabetes mellitus who presented to [facility] ED on 04/24/2023 due to unresponsiveness.  Upon EMS arrival she was noted to be hypoxic with O2 saturations in the 40s for which she was placed on CPAP with improvement in sats to the 80s.  Upon arrival to the ED she remained somnolent but slightly arousable, and given her hypoxia, she was transitioned to BiPAP.  Pt is currently intubated and sedated, and no other history is currently available.   ED Course: Initial Vital Signs: Temperature 96.5 F axillary, pulse 87, respiratory rate 33, blood pressure 136/97, SpO2 78% Significant Labs: Sodium 134, glucose 313, BUN 59, creatinine 1.27, BNP 436, high-sensitivity troponin 139, lactic acid 1.7, WBC 33.4, D-dimer 2.13 VBG: pH 7.19/pCO2 82/pO2 39/bicarb 31.3 Positive for influenza A Urinalysis negative for UTI Imaging Chest X-ray>>IMPRESSION: *Bibasilar heterogeneous opacities, favored to represent multilobar pneumonia. Follow-up to clearing is recommended. CTa Chest>> pending currently Medications Administered: 1 L normal saline bolus, 125 mg Solu-Medrol, IV azithromycin and ceftriaxone, Levophed drip initiated  Despite BiPAP patient's ABG worsened with worsening mental  status.  Therefore ED provider elected to intubate.  PCCM is asked to admit for further workup and treatment.  Please see "significant hospital events" section below for full detailed hospital course.   Pertinent  Medical History   Past Medical History:  Diagnosis Date   Arthritis    Asthma    Cataract of both eyes    Complication of anesthesia    woke up during surgery x1   COPD (chronic obstructive pulmonary disease) (HCC)    Coronary artery disease    DDD (degenerative disc disease), lumbar    Diabetes mellitus without complication (HCC)    Dyspnea    DUE TO COPD   Hernia of abdominal wall    History of hiatal hernia    small   Hyperlipidemia    Hypertension    Hypothyroidism    hashimotos throiditis  -thyroidectomy  2000   Occasional tremors    Oxygen desaturation    NOW PT ON 3-4 LITERS [place] DAILY    Micro Data:  2/13: COVID/FLU/RSV PCR>> + Influenza A 2/13: Blood culture x2>> no growth 2/13: Strep pneumo urinary antigen>> negative 2/13: Legionella urinary antigen>> negative 2/13: HIV screen>>negative 2/13: MRSA PCR>> negative 2/15: Tracheal aspirate>> no growth  Antimicrobials:   Anti-infectives (From admission, onward)    Start     Dose/Rate Route Frequency Ordered Stop   04/30/23 2200  ceFEPIme (MAXIPIME) 1 g in sodium chloride 0.9 % 100 mL IVPB  Status:  Discontinued        1 g 200 mL/hr over 30 Minutes Intravenous Every 24 hours 04/29/23 1842 04/30/23 1006   04/29/23 1145  linezolid (ZYVOX) tablet 600 mg  Status:  Discontinued        600 mg Per Tube Every  12 hours 04/29/23 1052 04/30/23 1006   04/28/23 1015  vancomycin (VANCOREADY) IVPB 1250 mg/250 mL        1,250 mg 166.7 mL/hr over 90 Minutes Intravenous  Once 04/28/23 0921 04/28/23 1209   04/28/23 1000  oseltamivir (TAMIFLU) 6 MG/ML suspension 30 mg  Status:  Discontinued        30 mg Per Tube Daily 04/28/23 0737 04/28/23 0747   04/28/23 1000  oseltamivir (TAMIFLU) 6 MG/ML suspension 75 mg        75  mg Per Tube Daily 04/28/23 0747 04/29/23 1155   04/27/23 1000  oseltamivir (TAMIFLU) 6 MG/ML suspension 75 mg  Status:  Discontinued        75 mg Per Tube Daily 04/26/23 1834 04/28/23 0737   04/27/23 0815  vancomycin (VANCOCIN) IVPB 1000 mg/200 mL premix        1,000 mg 200 mL/hr over 60 Minutes Intravenous  Once 04/27/23 0724 04/27/23 0921   04/26/23 2200  ceFEPIme (MAXIPIME) 2 g in sodium chloride 0.9 % 100 mL IVPB  Status:  Discontinued        2 g 200 mL/hr over 30 Minutes Intravenous Every 12 hours 04/26/23 1834 04/29/23 1842   04/26/23 1000  oseltamivir (TAMIFLU) 6 MG/ML suspension 30 mg  Status:  Discontinued        30 mg Per Tube Daily 04/25/23 1017 04/26/23 1834   04/25/23 2000  vancomycin (VANCOREADY) IVPB 1250 mg/250 mL  Status:  Discontinued        1,250 mg 166.7 mL/hr over 90 Minutes Intravenous Every 24 hours 04/24/23 2106 04/25/23 1151   04/25/23 1300  ceFEPIme (MAXIPIME) 2 g in sodium chloride 0.9 % 100 mL IVPB  Status:  Discontinued        2 g 200 mL/hr over 30 Minutes Intravenous Every 24 hours 04/25/23 1157 04/26/23 1834   04/25/23 1200  azithromycin (ZITHROMAX) 500 mg in sodium chloride 0.9 % 250 mL IVPB        500 mg 250 mL/hr over 60 Minutes Intravenous Every 24 hours 04/24/23 1557 04/28/23 1312   04/25/23 1150  vancomycin variable dose per unstable renal function (pharmacist dosing)  Status:  Discontinued         Does not apply See admin instructions 04/25/23 1151 04/29/23 1052   04/25/23 1100  cefTRIAXone (ROCEPHIN) 2 g in sodium chloride 0.9 % 100 mL IVPB  Status:  Discontinued        2 g 200 mL/hr over 30 Minutes Intravenous Every 24 hours 04/24/23 1557 04/25/23 1021   04/24/23 2200  oseltamivir (TAMIFLU) 6 MG/ML suspension 30 mg  Status:  Discontinued        30 mg Per Tube 2 times daily 04/24/23 1559 04/25/23 1017   04/24/23 2045  vancomycin (VANCOREADY) IVPB 2000 mg/400 mL        2,000 mg 200 mL/hr over 120 Minutes Intravenous  Once 04/24/23 2034 04/25/23 0045    04/24/23 1245  cefTRIAXone (ROCEPHIN) 2 g in sodium chloride 0.9 % 100 mL IVPB        2 g 200 mL/hr over 30 Minutes Intravenous Once 04/24/23 1244 04/24/23 1407   04/24/23 1245  azithromycin (ZITHROMAX) 500 mg in sodium chloride 0.9 % 250 mL IVPB        500 mg 250 mL/hr over 60 Minutes Intravenous  Once 04/24/23 1244 04/24/23 1513       Significant Hospital Events: Including procedures, antibiotic start and stop dates in addition to other pertinent  events   2/13: Presented to ED with AMS, acute respiratory distress and hypoxia.  Initially trialed on BiPAP but with worsening ABG and mental status.  ED provider intubated.  PCCM asked to admit. 04/25/2023 - Patient with improving respiratory status however with worsening wbc and procalcitonin.  04/26/2023 - HD cath placed and started on CRRT. 2/17 remains on vent, on CRRT 02/18: CRRT discontinued  02/19: On minimal vent settings.  Plan for WUA/SBT as tolerated utilizing Precedex.  With SBT, increased RR/WOB/accessory muscle use.  Adequate urine output and electrolytes acceptable, no plan for HD today. 02/20: Remains on minimal vent support, SBT as tolerated. On WUA with Precedex, she opens eyes but not following commands,  MRI Brain negative for acute intracranial abnormality. Faild SBT due to tachypnea (RR 40's) and increased WOB 02/21: No significant events noted overnight.  Afebrile, hemodynamically stable, not requiring vasopressors. Leukocytosis improving. On minimal vent support, plan for WUA and SBT as tolerated ~ failed SBT due to tachypnea (RR 40's) and increased WOB.  Not following commands on WUA, will obtain EEG. 05/03/2023: Failed SBT due to altered mental status.   Interim History / Subjective:  Remains intubated and sedated.  Awakens to voice and touch but unable to follow basic commands.  Still requiring small doses of pressors to maintain MAP above 65.  No fever.  Urine output optimal.  No acute changes during shift  Objective    Blood pressure (!) 116/56, pulse 72, temperature 99 F (37.2 C), temperature source Axillary, resp. rate (!) 22, height 5' 7.99" (1.727 m), weight 114.1 kg, SpO2 95%.    Vent Mode: PRVC FiO2 (%):  [35 %] 35 % Set Rate:  [22 bmp] 22 bmp Vt Set:  [540 mL] 540 mL PEEP:  [5 cmH20-8 cmH20] 8 cmH20 Pressure Support:  [5 cmH20] 5 cmH20 Plateau Pressure:  [19 cmH20] 19 cmH20   Intake/Output Summary (Last 24 hours) at 05/03/2023 0839 Last data filed at 05/03/2023 0700 Gross per 24 hour  Intake 2382.1 ml  Output 2095 ml  Net 287.1 ml   Filed Weights   05/01/23 0330 05/02/23 0500 05/03/23 0458  Weight: 118 kg 113.2 kg 114.1 kg    Examination: General: Critically ill-appearing female, laying in bed, intubated and sedated, no acute distress HENT: Atraumatic, normocephalic, neck supple, difficult to assess JVD due to body habitus and bilateral internal jugular central line/HD catheters.  orally intubated Lungs: Coarse throughout, even, nonlabored, synchronous with the vent Cardiovascular: Irregularly irregular rhythm, rate controlled, no murmurs, rubs, gallops Abdomen: Obese, soft, nontender, nondistended, no guarding or rebound tenderness, bowel sounds positive x 4 Extremities: Normal bulk and tone, no deformities, 1+edema to bilateral lower extremities Neuro: Sedated on Fentanyl and Propofol currently,withdraws from pain, but currently not following commands, pupils PERRLA at 3 mm bilaterally GU: Foley catheter in place  Resolved Hospital Problem list     Assessment & Plan:   #Acute Hypoxic & Hypercapnic Respiratory Failure in the setting of AECOPD, Influenza infection, and questionable superimposed bacterial pneumonia CTa Chest on 2/13: negative for PE, AAA or dissection. Concerning for interstitial and alveolar edema vs pneumonitis, and bibasilar consolidation (R >L). -Full vent support, implement lung protective strategies -Plateau pressures less than 30 cm H20 -Wean FiO2 & PEEP as  tolerated to maintain O2 sats >92% -Follow intermittent Chest X-ray & ABG as needed -Spontaneous Breathing Trials when respiratory parameters met and mental status permits -Implement VAP Bundle -Bronchodilators -IV Steroids -Completed course of ABX as above  -Completed course of  Tamiflu -Creatinine improving; no further need for HD  #Shock: Suspect Septic ~resolving #Elevated Troponin in setting of demand ischemia  #Atrial Fibrillation with RVR ~ RATE CONTROLLED Echocardiogram 04/25/23: LVEF 60-65%, mild LVH, normal diastolic parameters, RV systolic function is normal, moderately elevated pulmonary artery systolic pressure, mild to moderate AS -Continuous cardiac monitoring -Maintain MAP >65 -IV fluids -Vasopressors as needed to maintain MAP goal ~ currently weaned off -Lactic acid is normalized -HS Troponin peaked at 139 -Diuresis as BP and renal function permits  -Continue Heparin gtt: Dosing per Pharmacy  #Meets SIRS Criteria (RR 34, WBC 33.4) #Severe Sepsis in setting of Influenza A infection and questionable superimposed bacterial pneumonia ~ TREATED  -Trend WBC's -Follow cultures as above -Complete course of Cefepime and Linezolid on 2/19 -Continue to monitor fever curve and reculture as needed  #Acute Kidney Injury on CKD Stage IIIa ~ IMPROVING #Mild Hyponatremia ~ RESOLVED -Monitor I&O's / urinary output -Follow BMP -Ensure adequate renal perfusion -Avoid nephrotoxic agents as able -Replace electrolytes as indicated ~ Pharmacy following for assistance with electrolyte replacement -Nephrology has signed off; recall if needed  #Diabetes Mellitus -CBG's q4h; Target range of 140 to 180 -SSI -Follow ICU Hypo/Hyperglycemia protocol  #Acute Metabolic Encephalopathy, suspect CO2 Narcosis #Sedation needs in setting of mechanical ventilation -CT Head on 2/13 negative for acute intracranial process -MRI Brain on 2/20 negative for acute intracranial process -Treatment of  metabolic derangements, sepsis, and hypercapnia as outlined above -Maintain a RASS goal of 0 to -1 -Fentanyl and Propofol as needed to maintain RASS goal -Avoid sedating medications as able -Daily wake up assessment ~ utilize Precedex for WUA -Will Obtain EEG given persistent encephalopathy to rule out subclinical seizures   Patient is critically ill with severe acute hypoxic and hypercapnic respiratory failure requiring mechanical ventilation.  Given her underlying COPD at baseline, suspect she will be difficult to liberate from mechanical ventilation.    Best Practice (right click and "Reselect all SmartList Selections" daily)   Diet/type: Tube feeds DVT prophylaxis: Heparin gtt GI prophylaxis: PPI Lines: HD catheter, still needed Foley:  Yes, and it is still needed Code Status:  full code Last date of multidisciplinary goals of care discussion [2/21]  2/22: Will update pt's family when they arrive at bedside on plan of care.  Labs   CBC: Recent Labs  Lab 04/29/23 0415 04/30/23 0406 05/01/23 0403 05/02/23 0444 05/03/23 0253  WBC 26.2* 29.4* 34.6* 26.1* 24.5*  HGB 9.7* 9.7* 10.1* 9.0* 9.0*  HCT 28.5* 28.8* 30.0* 26.8* 26.4*  MCV 89.3 90.3 89.8 90.2 88.9  PLT 280 302 331 338 406*    Basic Metabolic Panel: Recent Labs  Lab 04/29/23 0415 04/29/23 1543 04/30/23 0406 05/01/23 0403 05/02/23 0444 05/03/23 0253  NA 131* 134* 133* 136 136 138  K 4.4 3.6 3.6 3.8 3.7 3.8  CL 98 101 99 100 100 101  CO2 24 26 25 26 27 26   GLUCOSE 235* 155* 155* 189* 167* 205*  BUN 38* 37* 51* 73* 89* 75*  CREATININE 1.31* 1.01* 1.45* 1.32* 1.27* 1.08*  CALCIUM 8.7* 8.8* 9.3 9.5 9.5 9.5  MG 2.3  --  2.0 2.0 1.7 1.9  PHOS 2.7 2.3* 2.5 2.9 3.6 3.4   GFR: Estimated Creatinine Clearance: 65.2 mL/min (A) (by C-G formula based on SCr of 1.08 mg/dL (H)). Recent Labs  Lab 04/30/23 0406 05/01/23 0403 05/02/23 0444 05/03/23 0253  WBC 29.4* 34.6* 26.1* 24.5*    Liver Function  Tests: Recent Labs  Lab 04/29/23 1543  04/30/23 0406 05/01/23 0403 05/02/23 0444 05/03/23 0253  ALBUMIN 2.0* 2.2* 2.4* 2.3* 2.5*   No results for input(s): "LIPASE", "AMYLASE" in the last 168 hours. No results for input(s): "AMMONIA" in the last 168 hours.  ABG    Component Value Date/Time   PHART 7.38 04/29/2023 1202   PCO2ART 46 04/29/2023 1202   PO2ART 83 04/29/2023 1202   HCO3 27.2 04/29/2023 1202   ACIDBASEDEF 3.2 (H) 04/27/2023 1551   O2SAT 98.9 04/29/2023 1202     Coagulation Profile: No results for input(s): "INR", "PROTIME" in the last 168 hours.   Cardiac Enzymes: No results for input(s): "CKTOTAL", "CKMB", "CKMBINDEX", "TROPONINI" in the last 168 hours.  HbA1C: Hemoglobin A1C  Date/Time Value Ref Range Status  11/16/2013 04:11 PM 6.5 (H) 4.2 - 6.3 % Final    Comment:    The American Diabetes Association recommends that a primary goal of therapy should be <7% and that physicians should reevaluate the treatment regimen in patients with HbA1c values consistently >8%.    Hgb A1c MFr Bld  Date/Time Value Ref Range Status  04/25/2023 04:11 AM 6.5 (H) 4.8 - 5.6 % Final    Comment:    (NOTE) Pre diabetes:          5.7%-6.4%  Diabetes:              >6.4%  Glycemic control for   <7.0% adults with diabetes   05/04/2020 04:33 AM 8.0 (H) 4.8 - 5.6 % Final    Comment:    (NOTE) Pre diabetes:          5.7%-6.4%  Diabetes:              >6.4%  Glycemic control for   <7.0% adults with diabetes     CBG: Recent Labs  Lab 05/02/23 1542 05/02/23 1943 05/02/23 2334 05/03/23 0329 05/03/23 0741  GLUCAP 198* 140* 118* 187* 203*    Review of Systems:   Unable to assess due to AMS/intubation/sedation   Past Medical History:  She,  has a past medical history of Arthritis, Asthma, Cataract of both eyes, Complication of anesthesia, COPD (chronic obstructive pulmonary disease) (HCC), Coronary artery disease, DDD (degenerative disc disease), lumbar, Diabetes  mellitus without complication (HCC), Dyspnea, Hernia of abdominal wall, History of hiatal hernia, Hyperlipidemia, Hypertension, Hypothyroidism, Occasional tremors, and Oxygen desaturation.   Surgical History:   Past Surgical History:  Procedure Laterality Date   ABDOMINAL HYSTERECTOMY     CHOLECYSTECTOMY     COLONOSCOPY WITH PROPOFOL N/A 09/08/2014   Procedure: COLONOSCOPY WITH PROPOFOL;  Surgeon: Midge Minium, MD;  Location: [facility] ENDOSCOPY;  Service: Endoscopy;  Laterality: N/A;   GANGLION CYST EXCISION Right 08/15/2020   Procedure: Right index finger cyst removal;  Surgeon: Kennedy Bucker, MD;  Location: [facility] ORS;  Service: Orthopedics;  Laterality: Right;   HIATAL HERNIA REPAIR  2000   OTHER SURGICAL HISTORY     vocal cord surgery due to injury during thyroid surgery   parathyroidectomy  2000   THYROIDECTOMY     TONSILLECTOMY     TUMOR REMOVAL Right Leg     Social History:   reports that she quit smoking about 7 years ago. Her smoking use included cigarettes. She started smoking about 55 years ago. She has a 48 pack-year smoking history. She has never used smokeless tobacco. She reports that she does not drink alcohol and does not use drugs.   Family History:  Her family history includes Breast cancer in her sister; Kidney  disease in her mother.   Allergies No Known Allergies   Home Medications  Prior to Admission medications   Medication Sig Start Date End Date Taking? Authorizing Provider  MOUNJARO 10 MG/0.5ML Pen Inject 10 mg into the skin once a week. 03/27/23  Yes [provider]  albuterol (VENTOLIN HFA) 108 (90 Base) MCG/ACT inhaler Inhale 2 puffs into the lungs every 4 (four) hours as needed for wheezing or shortness of breath.    [provider]  allopurinol (ZYLOPRIM) 100 MG tablet Take 100 mg by mouth every morning.    [provider]  amLODipine (NORVASC) 5 MG tablet Take 5 mg by mouth every morning. 07/23/20   [provider]   atorvastatin (LIPITOR) 40 MG tablet Take 40 mg by mouth every morning.    [provider]  benazepril (LOTENSIN) 40 MG tablet Take 40 mg by mouth every morning.    [provider]  chlorhexidine (PERIDEX) 0.12 % solution Use as directed 15 mLs in the mouth or throat as needed.    [provider]  ELIQUIS 5 MG TABS tablet Take 5 mg by mouth 2 (two) times daily.    [provider]  fluticasone (FLONASE) 50 MCG/ACT nasal spray Place 2 sprays into both nostrils daily as needed for allergies. 03/31/20   [provider]  fluticasone-salmeterol (ADVAIR HFA) 161-09 MCG/ACT inhaler Inhale 2 puffs by mouth twice daily 07/07/21   Erin Fulling, MD  furosemide (LASIX) 80 MG tablet Take 80 mg by mouth every morning.    [provider]  gabapentin (NEURONTIN) 600 MG tablet Take 600 mg by mouth 3 (three) times daily.    [provider]  hydrochlorothiazide (HYDRODIURIL) 25 MG tablet Take 25 mg by mouth every morning.    [provider]  HYDROcodone-acetaminophen (NORCO) 5-325 MG tablet Take 1 tablet by mouth every 6 (six) hours as needed for moderate pain. 08/15/20   Kennedy Bucker, MD  ibuprofen (ADVIL) 800 MG tablet Take 800 mg by mouth every 8 (eight) hours as needed for pain. 07/21/20   [provider]  indomethacin (INDOCIN) 25 MG capsule Take 25 mg by mouth daily.    [provider]  LANTUS SOLOSTAR 100 UNIT/ML Solostar Pen Inject 80 Units into the skin every morning. 05/16/20   [provider]  levothyroxine (SYNTHROID) 200 MCG tablet Take 200 mcg by mouth daily before breakfast.    [provider]  Melatonin 10 MG TABS Take 10 mg by mouth at bedtime. Patient not taking: Reported on 08/15/2020    [provider]  methocarbamol (ROBAXIN) 500 MG tablet Take 1-2 tablets by mouth every 6 (six) hours as needed for muscle spasms. 07/18/20   [provider]  Metoprolol Tartrate 75 MG TABS Take 1  tablet by mouth 2 (two) times daily.    [provider]  montelukast (SINGULAIR) 10 MG tablet Take 10 mg by mouth every morning.    [provider]  [facility] powder Apply 1 Application topically 2 (two) times daily.    [provider]  olopatadine (PATANOL) 0.1 % ophthalmic solution Place 1 drop into both eyes daily.    [provider]  OXYGEN Inhale 3-4 L into the lungs daily.    [provider]  promethazine (PHENERGAN) 25 MG tablet Take 25 mg by mouth every 6 (six) hours as needed for nausea or vomiting.    [provider]  psyllium (REGULOID) 0.52 g capsule Take 0.52 g by mouth 2 (  two) times daily.    [provider]  Tiotropium Bromide Monohydrate (SPIRIVA RESPIMAT) 1.25 MCG/ACT AERS Inhale 2 puffs into the lungs daily for 30 doses. 10/03/20 11/02/20  Erin Fulling, MD  tiZANidine (ZANAFLEX) 4 MG tablet Take 4 mg by mouth at bedtime.    [provider]  tretinoin (RETIN-A) 0.05 % cream Apply to face qhs, wash off qam 02/18/22   Deirdre Evener, MD  TRULICITY 0.75 MG/0.5ML SOPN Inject 0.75 mg into the skin once a week. wednesday 07/08/20   [provider]  VALERIAN ROOT PO Take 1,200 mg by mouth at bedtime.    [provider]     Critical care time: 40 minutes   Levi Klaiber S. [facility] ANP-BC Pulmonary and Critical Care Medicine [facility] Pager [phone] or [phone]  NB: This document was prepared using Dragon voice recognition software and may include unintentional dictation errors.

## 2023-05-03 NOTE — Consult Note (Signed)
 PHARMACY CONSULT NOTE - ELECTROLYTES  Pharmacy Consult for Electrolyte Monitoring and Replacement   Recent Labs:  Height: 5' 7.99" (172.7 cm) Weight: 114.1 kg (251 lb 8.7 oz) IBW/kg (Calculated) : 63.88 Estimated Creatinine Clearance: 65.2 mL/min (A) (by C-G formula based on SCr of 1.08 mg/dL (H)). Potassium (mmol/L)  Date Value  05/03/2023 3.8  11/16/2013 3.5   Magnesium (mg/dL)  Date Value  84/69/6295 1.9  11/16/2013 1.0 (L)   Calcium (mg/dL)  Date Value  28/41/3244 9.5   Calcium, Total (mg/dL)  Date Value  03/13/7251 9.1   Albumin (g/dL)  Date Value  66/44/0347 2.5 (L)  11/16/2013 3.8   Phosphorus (mg/dL)  Date Value  42/59/5638 3.4   Sodium (mmol/L)  Date Value  05/03/2023 138  11/16/2013 133 (L)    Assessment  Sarah Norton is a 68 y.o. female presenting with respiratory failure and unresponsive. PMH significant for DM and COPD. Patient was started on CRRT 2/15 then taken off evening of 2/18. They continue to have good urine output. Nephrology has signed off. Pharmacy has been consulted to monitor and replace electrolytes while they're in the ICU.   Diet: npo Nutrition: Vital 50 mL/hr Pertinent medications: free water 30 ml q4H  Goal of Therapy: Electrolytes WNL   Plan:  No replacement needed.  F/u with AM labs.   Thank you for allowing pharmacy to be a part of this patient's care.  Ronnald Ramp, PharmD 05/03/2023 8:32 AM

## 2023-05-03 NOTE — Consult Note (Signed)
 PHARMACY - ANTICOAGULATION CONSULT NOTE  Pharmacy Consult for Heparin Infusion Indication: atrial fibrillation  No Known Allergies  Patient Measurements: Height: 5' 7.99" (172.7 cm) Weight: 114.1 kg (251 lb 8.7 oz) IBW/kg (Calculated) : 63.88 Heparin Dosing Weight: 88.8 kg  Vital Signs: Temp: 99.5 F (37.5 C) (02/22 0000) Temp Source: Axillary (02/22 0000) BP: 111/54 (02/22 0415) Pulse Rate: 74 (02/22 0415)  Labs: Recent Labs    05/01/23 0403 05/02/23 0444 05/03/23 0253  HGB 10.1* 9.0* 9.0*  HCT 30.0* 26.8* 26.4*  PLT 331 338 406*  HEPARINUNFRC 0.39 0.39 0.39  CREATININE 1.32* 1.27* 1.08*   Estimated Creatinine Clearance: 65.2 mL/min (A) (by C-G formula based on SCr of 1.08 mg/dL (H)).  Medical History: Past Medical History:  Diagnosis Date   Arthritis    Asthma    Cataract of both eyes    Complication of anesthesia    woke up during surgery x1   COPD (chronic obstructive pulmonary disease) (HCC)    Coronary artery disease    DDD (degenerative disc disease), lumbar    Diabetes mellitus without complication (HCC)    Dyspnea    DUE TO COPD   Hernia of abdominal wall    History of hiatal hernia    small   Hyperlipidemia    Hypertension    Hypothyroidism    hashimotos throiditis  -thyroidectomy  2000   Occasional tremors    Oxygen desaturation    NOW PT ON 3-4 LITERS [place] DAILY   Medications:  Medications Prior to Admission  Medication Sig Dispense Refill Last Dose/Taking   albuterol (VENTOLIN HFA) 108 (90 Base) MCG/ACT inhaler Inhale 2 puffs into the lungs every 4 (four) hours as needed for wheezing or shortness of breath.   Taking As Needed   allopurinol (ZYLOPRIM) 100 MG tablet Take 100 mg by mouth every morning.   Past Week   amLODipine (NORVASC) 5 MG tablet Take 5 mg by mouth every morning.   Past Week   atorvastatin (LIPITOR) 40 MG tablet Take 40 mg by mouth every morning.   Past Week   benazepril (LOTENSIN) 40 MG tablet Take 40 mg by mouth every  morning.   Past Week   ELIQUIS 5 MG TABS tablet Take 5 mg by mouth 2 (two) times daily.   Past Week   fluticasone (FLONASE) 50 MCG/ACT nasal spray Place 2 sprays into both nostrils daily as needed for allergies.   Past Week   fluticasone-salmeterol (ADVAIR HFA) 115-21 MCG/ACT inhaler Inhale 2 puffs by mouth twice daily 12 g 0 Past Week   furosemide (LASIX) 80 MG tablet Take 80 mg by mouth every morning.   Past Week   gabapentin (NEURONTIN) 600 MG tablet Take 600 mg by mouth 3 (three) times daily.   Past Week   hydrochlorothiazide (HYDRODIURIL) 25 MG tablet Take 25 mg by mouth every morning.   Past Week   indomethacin (INDOCIN) 25 MG capsule Take 25 mg by mouth 3 (three) times daily with meals.   Past Week   levocetirizine (XYZAL) 5 MG tablet Take 5 mg by mouth every evening.   Past Week   levothyroxine (SYNTHROID) 175 MCG tablet Take 175 mcg by mouth daily before breakfast.   Past Week   methocarbamol (ROBAXIN) 500 MG tablet Take 1-2 tablets by mouth every 6 (six) hours as needed for muscle spasms.   Past Week   Metoprolol Tartrate 75 MG TABS Take 1 tablet by mouth 2 (two) times daily.   Past Week  montelukast (SINGULAIR) 10 MG tablet Take 10 mg by mouth every morning.   Past Week   MOUNJARO 10 MG/0.5ML Pen Inject 10 mg into the skin once a week.   Past Week   [facility] powder Apply 1 Application topically 2 (two) times daily.   Past Week   promethazine (PHENERGAN) 25 MG tablet Take 25 mg by mouth every 6 (six) hours as needed for nausea or vomiting.   Taking As Needed   Tiotropium Bromide Monohydrate (SPIRIVA RESPIMAT) 1.25 MCG/ACT AERS Inhale 2 puffs into the lungs daily for 30 doses. 3 each 0 Past Week   tiZANidine (ZANAFLEX) 4 MG tablet Take 4 mg by mouth 3 (three) times daily.   Past Week   tretinoin (RETIN-A) 0.05 % cream Apply to face qhs, wash off qam 45 g 11 Past Week   OXYGEN Inhale 3-4 L into the lungs daily.      Pertinent Medications: PTA apixaban, last dose unknown  Last dose  heparin sq 2/15 @0522 .   Assessment: 68 year old female with a past medical history significant for COPD on home supplemental oxygen and diabetes mellitus who presented to [facility] ED on 04/24/2023 due to unresponsiveness. PMH of afib on apixaban. CHADSVASc 3. Started on CRRT 2/15. Pharmacy was consulted to dose and manage this patient's heparin.   Date Time aPTT/HL Rate/Comment  2/15 1947 45/---  1200/SUBtherapeutic 2/16 0552 72/0.81 1500/aPTT therapeutic  2/16    1336     67/--                1500/aPTT therapeutic    2/16 2115 62/--  1500/aPTT SUBtherapeutic 2/17 0616 69/0.65 1650/both levels therapeutic x 1 2/17 1352 74/0.61 1650/both levels therapeutic x 2 2/18 0415 -- / 0.62 1650/therapeutic x 3 2/19 0406 -- / 0.42 1650/therapeutic x 4 2/20 0403 -- / 0.39 1650/therapeutic x 5 2/21 0444 -- / 0.39 1650/therapeutic x 6 2/22 0253 -- / 0.39 1650/therapeutic x 7  Baseline Labs:  Hgb 9.5 Plt 325 INR/PT 1.1/14.3  HL 0.67 - possibly has some false elevated due to apixaban.   Goal of Therapy:  Heparin level 0.3-0.7 units/ml aPTT 66-102 seconds Monitor platelets by anticoagulation protocol: Yes  Plan:  Continue heparin infusion at 1650 units/hr Recheck HL daily w/ AM labs while therapeutic Monitor CBC and HL daily while on heparin   Otelia Sergeant, PharmD, [facility] 05/03/2023 5:17 AM

## 2023-05-04 DIAGNOSIS — J9601 Acute respiratory failure with hypoxia: Secondary | ICD-10-CM | POA: Diagnosis not present

## 2023-05-04 DIAGNOSIS — G9341 Metabolic encephalopathy: Secondary | ICD-10-CM | POA: Diagnosis not present

## 2023-05-04 DIAGNOSIS — N1831 Chronic kidney disease, stage 3a: Secondary | ICD-10-CM | POA: Diagnosis not present

## 2023-05-04 DIAGNOSIS — J9602 Acute respiratory failure with hypercapnia: Secondary | ICD-10-CM | POA: Diagnosis not present

## 2023-05-04 DIAGNOSIS — I4892 Unspecified atrial flutter: Secondary | ICD-10-CM | POA: Diagnosis not present

## 2023-05-04 LAB — CBC
HCT: 26.7 % — ABNORMAL LOW (ref 36.0–46.0)
Hemoglobin: 8.9 g/dL — ABNORMAL LOW (ref 12.0–15.0)
MCH: 30.5 pg (ref 26.0–34.0)
MCHC: 33.3 g/dL (ref 30.0–36.0)
MCV: 91.4 fL (ref 80.0–100.0)
Platelets: 428 10*3/uL — ABNORMAL HIGH (ref 150–400)
RBC: 2.92 MIL/uL — ABNORMAL LOW (ref 3.87–5.11)
RDW: 16.4 % — ABNORMAL HIGH (ref 11.5–15.5)
WBC: 16.9 10*3/uL — ABNORMAL HIGH (ref 4.0–10.5)
nRBC: 0 % (ref 0.0–0.2)

## 2023-05-04 LAB — RENAL FUNCTION PANEL
Albumin: 2.6 g/dL — ABNORMAL LOW (ref 3.5–5.0)
Anion gap: 8 (ref 5–15)
BUN: 77 mg/dL — ABNORMAL HIGH (ref 8–23)
CO2: 29 mmol/L (ref 22–32)
Calcium: 9.6 mg/dL (ref 8.9–10.3)
Chloride: 102 mmol/L (ref 98–111)
Creatinine, Ser: 1.09 mg/dL — ABNORMAL HIGH (ref 0.44–1.00)
GFR, Estimated: 55 mL/min — ABNORMAL LOW (ref >60)
Glucose, Bld: 239 mg/dL — ABNORMAL HIGH (ref 70–99)
Phosphorus: 2.4 mg/dL — ABNORMAL LOW (ref 2.5–4.6)
Potassium: 3.5 mmol/L (ref 3.5–5.1)
Sodium: 139 mmol/L (ref 135–145)

## 2023-05-04 LAB — BLOOD GAS, ARTERIAL
Acid-Base Excess: 10.2 mmol/L — ABNORMAL HIGH (ref 0.0–2.0)
Acid-Base Excess: 14.8 mmol/L — ABNORMAL HIGH (ref 0.0–2.0)
Bicarbonate: 34.3 mmol/L — ABNORMAL HIGH (ref 20.0–28.0)
Bicarbonate: 39.3 mmol/L — ABNORMAL HIGH (ref 20.0–28.0)
FIO2: 50 %
FIO2: 40 %
O2 Content: 50 L/min
O2 Content: 50 L/min
O2 Saturation: 100 %
O2 Saturation: 99 %
Patient temperature: 37
Patient temperature: 37
pCO2 arterial: 43 mmHg (ref 32–48)
pCO2 arterial: 46 mmHg (ref 32–48)
pH, Arterial: 7.51 — ABNORMAL HIGH (ref 7.35–7.45)
pH, Arterial: 7.54 — ABNORMAL HIGH (ref 7.35–7.45)
pO2, Arterial: 140 mmHg — ABNORMAL HIGH (ref 83–108)
pO2, Arterial: 82 mmHg — ABNORMAL LOW (ref 83–108)

## 2023-05-04 LAB — COMPREHENSIVE METABOLIC PANEL
ALT: 92 U/L — ABNORMAL HIGH (ref 0–44)
AST: 50 U/L — ABNORMAL HIGH (ref 15–41)
Albumin: 3.5 g/dL (ref 3.5–5.0)
Alkaline Phosphatase: 155 U/L — ABNORMAL HIGH (ref 38–126)
Anion gap: 14 (ref 5–15)
BUN: 76 mg/dL — ABNORMAL HIGH (ref 8–23)
CO2: 31 mmol/L (ref 22–32)
Calcium: 10.4 mg/dL — ABNORMAL HIGH (ref 8.9–10.3)
Chloride: 100 mmol/L (ref 98–111)
Creatinine, Ser: 1.02 mg/dL — ABNORMAL HIGH (ref 0.44–1.00)
GFR, Estimated: 60 mL/min — ABNORMAL LOW (ref >60)
Glucose, Bld: 204 mg/dL — ABNORMAL HIGH (ref 70–99)
Potassium: 4.2 mmol/L (ref 3.5–5.1)
Sodium: 145 mmol/L (ref 135–145)
Total Bilirubin: 1 mg/dL (ref 0.0–1.2)
Total Protein: 7.6 g/dL (ref 6.5–8.1)

## 2023-05-04 LAB — GLUCOSE, CAPILLARY
Glucose-Capillary: 140 mg/dL — ABNORMAL HIGH (ref 70–99)
Glucose-Capillary: 177 mg/dL — ABNORMAL HIGH (ref 70–99)
Glucose-Capillary: 205 mg/dL — ABNORMAL HIGH (ref 70–99)
Glucose-Capillary: 207 mg/dL — ABNORMAL HIGH (ref 70–99)
Glucose-Capillary: 208 mg/dL — ABNORMAL HIGH (ref 70–99)
Glucose-Capillary: 240 mg/dL — ABNORMAL HIGH (ref 70–99)
Glucose-Capillary: 295 mg/dL — ABNORMAL HIGH (ref 70–99)

## 2023-05-04 LAB — HEPARIN LEVEL (UNFRACTIONATED): Heparin Unfractionated: 0.31 [IU]/mL (ref 0.30–0.70)

## 2023-05-04 LAB — PHOSPHORUS: Phosphorus: 3.2 mg/dL (ref 2.5–4.6)

## 2023-05-04 LAB — TRIGLYCERIDES: Triglycerides: 101 mg/dL (ref <150)

## 2023-05-04 LAB — MAGNESIUM: Magnesium: 1.8 mg/dL (ref 1.7–2.4)

## 2023-05-04 MED ORDER — LEVOTHYROXINE SODIUM 50 MCG PO TABS: 175.0000 ug | ORAL_TABLET | Freq: Every day | ORAL | Status: DC

## 2023-05-04 MED ORDER — INSULIN GLARGINE-YFGN 100 UNIT/ML ~~LOC~~ SOLN
20.0000 [IU] | Freq: Two times a day (BID) | SUBCUTANEOUS | Status: DC
  Administered 2023-05-04 – 2023-05-11 (×13): 20 [IU] via SUBCUTANEOUS
  Filled 2023-05-04 (×17): qty 0.2

## 2023-05-04 MED ORDER — SENNA 8.6 MG PO TABS: 2.0000 | ORAL_TABLET | Freq: Every day | ORAL | Status: DC

## 2023-05-04 MED ORDER — LACTULOSE 10 GM/15ML PO SOLN: 10.0000 g | Freq: Every day | ORAL | Status: DC | PRN

## 2023-05-04 MED ORDER — MORPHINE SULFATE (PF) 2 MG/ML IV SOLN
1.0000 mg | INTRAVENOUS | Status: DC | PRN
  Administered 2023-05-04 (×2): 2 mg via INTRAVENOUS
  Administered 2023-05-04: 1 mg via INTRAVENOUS
  Filled 2023-05-04 (×3): qty 1

## 2023-05-04 MED ORDER — POTASSIUM CHLORIDE 20 MEQ PO PACK
40.0000 meq | PACK | Freq: Two times a day (BID) | ORAL | Status: DC
  Administered 2023-05-04: 40 meq
  Filled 2023-05-04: qty 2

## 2023-05-04 MED ORDER — LACTULOSE 10 GM/15ML PO SOLN
10.0000 g | Freq: Every day | ORAL | Status: DC | PRN
  Administered 2023-05-04: 10 g
  Filled 2023-05-04: qty 30

## 2023-05-04 MED ORDER — AMIODARONE HCL IN DEXTROSE 360-4.14 MG/200ML-% IV SOLN
60.0000 mg/h | INTRAVENOUS | Status: AC
  Administered 2023-05-04: 60 mg/h via INTRAVENOUS
  Filled 2023-05-04 (×2): qty 200

## 2023-05-04 MED ORDER — ALBUMIN HUMAN 25 % IV SOLN
12.5000 g | Freq: Once | INTRAVENOUS | Status: AC
  Administered 2023-05-04: 12.5 g via INTRAVENOUS
  Filled 2023-05-04: qty 50

## 2023-05-04 MED ORDER — AMIODARONE HCL IN DEXTROSE 360-4.14 MG/200ML-% IV SOLN
30.0000 mg/h | INTRAVENOUS | Status: DC
  Administered 2023-05-04 – 2023-05-07 (×7): 30 mg/h via INTRAVENOUS
  Filled 2023-05-04 (×6): qty 200

## 2023-05-04 MED ORDER — POTASSIUM & SODIUM PHOSPHATES 280-160-250 MG PO PACK
2.0000 | PACK | Freq: Once | ORAL | Status: AC
  Administered 2023-05-04: 2
  Filled 2023-05-04: qty 2

## 2023-05-04 MED ORDER — DEXMEDETOMIDINE HCL IN NACL 400 MCG/100ML IV SOLN
0.0000 ug/kg/h | INTRAVENOUS | Status: DC
  Administered 2023-05-04: 0.5 ug/kg/h via INTRAVENOUS
  Administered 2023-05-04: 0.2 ug/kg/h via INTRAVENOUS
  Administered 2023-05-05: 0.6 ug/kg/h via INTRAVENOUS
  Filled 2023-05-04 (×3): qty 100

## 2023-05-04 MED ORDER — POTASSIUM CHLORIDE 10 MEQ/100ML IV SOLN
10.0000 meq | INTRAVENOUS | Status: DC
  Filled 2023-05-04 (×4): qty 100

## 2023-05-04 MED ORDER — ACETAMINOPHEN 325 MG PO TABS: 650.0000 mg | ORAL_TABLET | ORAL | Status: DC | PRN

## 2023-05-04 MED ORDER — RENA-VITE PO TABS: 1.0000 | ORAL_TABLET | Freq: Every day | ORAL | Status: DC

## 2023-05-04 MED ORDER — POTASSIUM CHLORIDE 20 MEQ PO PACK: 40.0000 meq | PACK | Freq: Two times a day (BID) | ORAL | Status: DC

## 2023-05-04 MED ORDER — MAGNESIUM SULFATE IN D5W 1-5 GM/100ML-% IV SOLN
1.0000 g | Freq: Once | INTRAVENOUS | Status: AC
  Administered 2023-05-04: 1 g via INTRAVENOUS
  Filled 2023-05-04: qty 100

## 2023-05-04 MED ORDER — FUROSEMIDE 10 MG/ML IJ SOLN
40.0000 mg | Freq: Once | INTRAMUSCULAR | Status: AC
  Administered 2023-05-04: 40 mg via INTRAVENOUS

## 2023-05-04 MED ORDER — MORPHINE SULFATE (PF) 2 MG/ML IV SOLN
1.0000 mg | INTRAVENOUS | Status: DC | PRN
  Administered 2023-05-05 – 2023-05-06 (×4): 2 mg via INTRAVENOUS
  Filled 2023-05-04 (×4): qty 1

## 2023-05-04 MED ORDER — FLEET ENEMA RE ENEM: 1.0000 | ENEMA | Freq: Once | RECTAL | Status: DC

## 2023-05-04 MED ORDER — ADULT MULTIVITAMIN W/MINERALS CH: 1.0000 | ORAL_TABLET | Freq: Every day | ORAL | Status: DC

## 2023-05-04 MED ORDER — FUROSEMIDE 10 MG/ML IJ SOLN: 40.0000 mg | Freq: Two times a day (BID) | INTRAMUSCULAR | Status: DC

## 2023-05-04 NOTE — Progress Notes (Signed)
 Extubation order written. Cuff leak noted. Patient extubated and placed on  @ 3lpm @ 0920.

## 2023-05-04 NOTE — Plan of Care (Signed)
  Problem: Education: Goal: Knowledge of General Education information will improve Description: Including pain rating scale, medication(s)/side effects and non-pharmacologic comfort measures Outcome: Not Progressing   Problem: Health Behavior/Discharge Planning: Goal: Ability to manage health-related needs will improve Outcome: Not Progressing   Problem: Clinical Measurements: Goal: Ability to maintain clinical measurements within normal limits will improve Outcome: Progressing Goal: Will remain free from infection Outcome: Progressing Goal: Diagnostic test results will improve Outcome: Progressing Goal: Respiratory complications will improve Outcome: Not Progressing   Problem: Pain Managment: Goal: General experience of comfort will improve and/or be controlled Outcome: Progressing   Problem: Elimination: Goal: Will not experience complications related to bowel motility Outcome: Progressing   Problem: Coping: Goal: Level of anxiety will decrease Outcome: Not Progressing

## 2023-05-04 NOTE — Progress Notes (Signed)
 Patient following minimal commands, alert, stable pressures. Pressor support weaned off.  Patient alert, following minimal commands, in SBT on ventilator. Tolerating well.   Extubated per MD orders to 2L [place]. Patient with droop on left side, left body side weaker than right. MD aware.   Patient with increased WOB, RR 20-35s, patient in Aflutter with frequent PVCs, HR 80-140s.Patient given PRN medications for WOB per NP verbal order. Decreased WOB. New labs ordered.   EKG obtained, in chart. Amiodarone started.   Family at bedside, updated.   Patient speaking minimally, following minimal commands. HR improved.  Patient continues to use extremities bilaterally with purposeful movement. Patient attempts to reposition self for comfort in bed. Moves extremities to commands intermittently. Patient continues to speaks minimally, one to two words, with delayed interactions.  PRN medications given per NP verbal orders for increased work of breathing.  NP to modify orders. PRNs effective.  Precedex started at 0.2 per NP orders, titrated per verbal orders.

## 2023-05-04 NOTE — Progress Notes (Cosign Needed Addendum)
 NAME:  Sarah Norton, MRN:  [id number], DOB:  [birth year], LOS: 10 ADMISSION DATE:  04/24/2023, CONSULTATION DATE:  04/24/23 REFERRING MD:  Dr. Rosalia Hammers, CHIEF COMPLAINT:  Acute Respiratory Distress, hypoxia, AMS   Brief Pt Description / Synopsis:  68 y.o female admitted with Acute Metabolic Encephalopathy and Acute Hypoxic and Hypercapnic Respiratory Failure in the setting of Acute COPD Exacerbation due to Influenza A infection and questionable superimposed bacterial pneumonia, failed trial of BiPAP requiring intubation and mechanical ventilation.  Course complicated by AKI requiring initiation of CRRT.  History of Present Illness:  Sarah Norton is a 68 year old female with a past medical history significant for COPD on home supplemental oxygen and diabetes mellitus who presented to [facility] ED on 04/24/2023 due to unresponsiveness.  Upon EMS arrival she was noted to be hypoxic with O2 saturations in the 40s for which she was placed on CPAP with improvement in sats to the 80s.  Upon arrival to the ED she remained somnolent but slightly arousable, and given her hypoxia, she was transitioned to BiPAP.  Pt is currently intubated and sedated, and no other history is currently available.   ED Course: Initial Vital Signs: Temperature 96.5 F axillary, pulse 87, respiratory rate 33, blood pressure 136/97, SpO2 78% Significant Labs: Sodium 134, glucose 313, BUN 59, creatinine 1.27, BNP 436, high-sensitivity troponin 139, lactic acid 1.7, WBC 33.4, D-dimer 2.13 VBG: pH 7.19/pCO2 82/pO2 39/bicarb 31.3 Positive for influenza A Urinalysis negative for UTI Imaging Chest X-ray>>IMPRESSION: *Bibasilar heterogeneous opacities, favored to represent multilobar pneumonia. Follow-up to clearing is recommended. CTa Chest>> pending currently Medications Administered: 1 L normal saline bolus, 125 mg Solu-Medrol, IV azithromycin and ceftriaxone, Levophed drip initiated Despite BiPAP patient's ABG worsened with worsening mental  status.  Therefore ED provider elected to intubate.  PCCM is asked to admit for further workup and treatment. Please see "significant hospital events" section below for full detailed hospital course.   Pertinent  Medical History   Past Medical History:  Diagnosis Date   Arthritis    Asthma    Cataract of both eyes    Complication of anesthesia    woke up during surgery x1   COPD (chronic obstructive pulmonary disease) (HCC)    Coronary artery disease    DDD (degenerative disc disease), lumbar    Diabetes mellitus without complication (HCC)    Dyspnea    DUE TO COPD   Hernia of abdominal wall    History of hiatal hernia    small   Hyperlipidemia    Hypertension    Hypothyroidism    hashimotos throiditis  -thyroidectomy  2000   Occasional tremors    Oxygen desaturation    NOW PT ON 3-4 LITERS [place] DAILY    Micro Data:  2/13: COVID/FLU/RSV PCR>> + Influenza A 2/13: Blood culture x2>> no growth 2/13: Strep pneumo urinary antigen>> negative 2/13: Legionella urinary antigen>> negative 2/13: HIV screen>>negative 2/13: MRSA PCR>> negative 2/15: Tracheal aspirate>> no growth  Antimicrobials:   Anti-infectives (From admission, onward)    Start     Dose/Rate Route Frequency Ordered Stop   04/30/23 2200  ceFEPIme (MAXIPIME) 1 g in sodium chloride 0.9 % 100 mL IVPB  Status:  Discontinued        1 g 200 mL/hr over 30 Minutes Intravenous Every 24 hours 04/29/23 1842 04/30/23 1006   04/29/23 1145  linezolid (ZYVOX) tablet 600 mg  Status:  Discontinued        600 mg Per Tube Every 12 hours  04/29/23 1052 04/30/23 1006   04/28/23 1015  vancomycin (VANCOREADY) IVPB 1250 mg/250 mL        1,250 mg 166.7 mL/hr over 90 Minutes Intravenous  Once 04/28/23 0921 04/28/23 1209   04/28/23 1000  oseltamivir (TAMIFLU) 6 MG/ML suspension 30 mg  Status:  Discontinued        30 mg Per Tube Daily 04/28/23 0737 04/28/23 0747   04/28/23 1000  oseltamivir (TAMIFLU) 6 MG/ML suspension 75 mg        75  mg Per Tube Daily 04/28/23 0747 04/29/23 1155   04/27/23 1000  oseltamivir (TAMIFLU) 6 MG/ML suspension 75 mg  Status:  Discontinued        75 mg Per Tube Daily 04/26/23 1834 04/28/23 0737   04/27/23 0815  vancomycin (VANCOCIN) IVPB 1000 mg/200 mL premix        1,000 mg 200 mL/hr over 60 Minutes Intravenous  Once 04/27/23 0724 04/27/23 0921   04/26/23 2200  ceFEPIme (MAXIPIME) 2 g in sodium chloride 0.9 % 100 mL IVPB  Status:  Discontinued        2 g 200 mL/hr over 30 Minutes Intravenous Every 12 hours 04/26/23 1834 04/29/23 1842   04/26/23 1000  oseltamivir (TAMIFLU) 6 MG/ML suspension 30 mg  Status:  Discontinued        30 mg Per Tube Daily 04/25/23 1017 04/26/23 1834   04/25/23 2000  vancomycin (VANCOREADY) IVPB 1250 mg/250 mL  Status:  Discontinued        1,250 mg 166.7 mL/hr over 90 Minutes Intravenous Every 24 hours 04/24/23 2106 04/25/23 1151   04/25/23 1300  ceFEPIme (MAXIPIME) 2 g in sodium chloride 0.9 % 100 mL IVPB  Status:  Discontinued        2 g 200 mL/hr over 30 Minutes Intravenous Every 24 hours 04/25/23 1157 04/26/23 1834   04/25/23 1200  azithromycin (ZITHROMAX) 500 mg in sodium chloride 0.9 % 250 mL IVPB        500 mg 250 mL/hr over 60 Minutes Intravenous Every 24 hours 04/24/23 1557 04/28/23 1312   04/25/23 1150  vancomycin variable dose per unstable renal function (pharmacist dosing)  Status:  Discontinued         Does not apply See admin instructions 04/25/23 1151 04/29/23 1052   04/25/23 1100  cefTRIAXone (ROCEPHIN) 2 g in sodium chloride 0.9 % 100 mL IVPB  Status:  Discontinued        2 g 200 mL/hr over 30 Minutes Intravenous Every 24 hours 04/24/23 1557 04/25/23 1021   04/24/23 2200  oseltamivir (TAMIFLU) 6 MG/ML suspension 30 mg  Status:  Discontinued        30 mg Per Tube 2 times daily 04/24/23 1559 04/25/23 1017   04/24/23 2045  vancomycin (VANCOREADY) IVPB 2000 mg/400 mL        2,000 mg 200 mL/hr over 120 Minutes Intravenous  Once 04/24/23 2034 04/25/23 0045    04/24/23 1245  cefTRIAXone (ROCEPHIN) 2 g in sodium chloride 0.9 % 100 mL IVPB        2 g 200 mL/hr over 30 Minutes Intravenous Once 04/24/23 1244 04/24/23 1407   04/24/23 1245  azithromycin (ZITHROMAX) 500 mg in sodium chloride 0.9 % 250 mL IVPB        500 mg 250 mL/hr over 60 Minutes Intravenous  Once 04/24/23 1244 04/24/23 1513       Significant Hospital Events: Including procedures, antibiotic start and stop dates in addition to other pertinent events  2/13: Presented to ED with AMS, acute respiratory distress and hypoxia.  Initially trialed on BiPAP but with worsening ABG and mental status.  ED provider intubated.  PCCM asked to admit. 04/25/2023 - Patient with improving respiratory status however with worsening wbc and procalcitonin.  04/26/2023 - HD cath placed and started on CRRT. 2/17 remains on vent, on CRRT 02/18: CRRT discontinued  02/19: On minimal vent settings.  Plan for WUA/SBT as tolerated utilizing Precedex.  With SBT, increased RR/WOB/accessory muscle use.  Adequate urine output and electrolytes acceptable, no plan for HD today. 02/20: Remains on minimal vent support, SBT as tolerated. On WUA with Precedex, she opens eyes but not following commands,  MRI Brain negative for acute intracranial abnormality. Faild SBT due to tachypnea (RR 40's) and increased WOB 02/21: No significant events noted overnight.  Afebrile, hemodynamically stable, not requiring vasopressors. Leukocytosis improving. On minimal vent support, plan for WUA and SBT as tolerated ~ failed SBT due to tachypnea (RR 40's) and increased WOB.  Not following commands on WUA, will obtain EEG. 05/03/2023: Failed SBT due to altered mental status.   05/04/23: Tolerated SBT and was extubated to HF [place]  Interim History / Subjective:  No acute events overnight. More awake and following commands even though responses are slow. Urine output improved. Off pressors. BUN trending up but creatinine is trending down 1.45 to  1.09.  Successfully passed SBT and was extubated.  Postextubation patient had increased work of breathing and episodes of low SpO2.  She was placed on humidified high flow and currently saturating at 95% and above.  She is still working hard to breathe.  She was given Lasix and a dose of albumin.  Urine output continues to be optimal.  Only concern is patient's increased work of breathing.  She is on morphine 1 to 2 mg every 3 hours as needed.  Patient has a history of anxiety and get anxious easily.  She had not have a bowel movement in 3 days and she was given a dose of lactulose with positive effect.  Patient has had 2 extra large bowel movements.  Patient also had results of PVCs with intermittent a flutter.  She was started on amiodarone infusion with no bolus.  Heart rate is currently stable and fluctuating between 80 to 110 bpm.  Blood pressures this afternoon are running in the high end with systolic blood pressures between 140 and 160. Addendum: Patient's ABG shows worsening metabolic alkalosis.  Patient started on Precedex.  Continue as needed morphine.  Repeat blood gas at 2000. Objective   Blood pressure 139/61, pulse 73, temperature 99.3 F (37.4 C), temperature source Axillary, resp. rate (!) 22, height 5' 7.99" (1.727 m), weight 112.7 kg, SpO2 97%.    Vent Mode: PRVC FiO2 (%):  [35 %] 35 % Set Rate:  [22 bmp] 22 bmp Vt Set:  [540 mL] 540 mL PEEP:  [5 cmH20-8 cmH20] 8 cmH20 Plateau Pressure:  [17 cmH20] 17 cmH20   Intake/Output Summary (Last 24 hours) at 05/04/2023 0840 Last data filed at 05/04/2023 0800 Gross per 24 hour  Intake 2379.36 ml  Output 4150 ml  Net -1770.64 ml   Filed Weights   05/02/23 0500 05/03/23 0458 05/04/23 0500  Weight: 113.2 kg 114.1 kg 112.7 kg    Examination: General: Patient is alert and following basic commands, in mild to moderate respiratory distress. HENT: Atraumatic, normocephalic, PERRLA, neck supple, difficult to assess JVD due to body habitus  and bilateral internal jugular central line/HD  catheters.   Lungs: Bilateral breath sounds, diffuse rhonchi in all lung fields, increased work of breathing with respiratory rate between 24 and 30 breaths/min Cardiovascular: Irregularly irregular rhythm, rate controlled, no murmurs, rubs, gallops Abdomen: Obese, soft, distended, hypoactive bowel sounds, no pain with palpation Extremities: Normal bulk and tone, no deformities, 1+edema to bilateral lower extremities, venous stasis discoloration bilateral lower extremities Neuro: Awake, able to move upper and lower extremities moves right lower and right upper extremity more easily than the left leg and left arm.  She is able to respond to her name but is not able to offer any other verbal responses. GU: Foley catheter removed,  Resolved Hospital Problem list     Assessment & Plan:   #Acute Hypoxic & Hypercapnic Respiratory Failure in the setting of AECOPD, Influenza infection, and questionable superimposed bacterial pneumonia CTa Chest on 2/13: negative for PE, AAA or dissection. Concerning for interstitial and alveolar edema vs pneumonitis, and bibasilar consolidation (R >L). -Successfully extubated. - Continue aggressive pulmonary toileting -Follow intermittent Chest X-ray & ABG as needed -Bronchodilators as needed -IV Steroids -Monitor fever curve - Antibiotics and Tamiflu completed -Patient is at increased risk for reintubation.  Please monitor respiratory status closely. -Will repeat ABG -Continue as needed morphine for increased work of breathing  #Shock: Suspect Septic ~resolving #Elevated Troponin in setting of demand ischemia  #Atrial Fibrillation with RVR ~ RATE CONTROLLED Echocardiogram 04/25/23: LVEF 60-65%, mild LVH, normal diastolic parameters, RV systolic function is normal, moderately elevated pulmonary artery systolic pressure, mild to moderate AS -Continuous cardiac monitoring -Maintain MAP >65 -IV fluids -Off  pressors -Continue to monitor blood pressure closely -Diuresis as BP and renal function permits  -Continue Heparin gtt: Dosing per Pharmacy -Repeat ABG at 2000 -Precedex for anxiety and respiratory distress  #Meets SIRS Criteria (RR 34, WBC 33.4) #Severe Sepsis in setting of Influenza A infection and questionable superimposed bacterial pneumonia ~ TREATED  -Trend WBC's -Follow cultures as above -Complete course of Cefepime and Linezolid on 2/19 -Continue to monitor fever curve and reculture as needed  #Acute Kidney Injury on CKD Stage IIIa ~ IMPROVING #Mild Hyponatremia ~ RESOLVED -Monitor I&O's / urinary output -Creatinine trending down but BUN is trending up; trend both indices and reconsult nephrology as needed --Avoid nephrotoxic agents  -Replace electrolytes as indicated ~ Pharmacy following for assistance with electrolyte replacement -Follow-up with nephrology regarding need for further hemodialysis  #Diabetes Mellitus -CBG's q4h; Target range of 140 to 180 -SSI -Follow ICU Hypo/Hyperglycemia protocol  #Acute Metabolic Encephalopathy, suspect CO2 Narcosis #Sedation needs in setting of mechanical ventilation -CT Head on 2/13 negative for acute intracranial process -MRI Brain on 2/20 negative for acute intracranial process -Treatment of metabolic derangements, sepsis, and hypercapnia as outlined above -Mental status is improving -Avoid sedation -Avoid constipation and give lactulose as needed for no bowel movement in more than 2 days -Consider EEG if mental status fails to improve with improved kidney function and and resolution of shock  Best Practice (right click and "Reselect all SmartList Selections" daily)   Diet/type: N.p.o.; awaiting speech and swallow eval DVT prophylaxis: Heparin gtt GI prophylaxis: PPI Lines: HD cathete I think the patient did not get pulled by procedure r, still needed Foley:  Yes, and it is still needed Code Status:  full code Last date of  multidisciplinary goals of care discussion [2/23] at bedside with patient's significant other state  Labs   CBC: Recent Labs  Lab 04/30/23 0406 05/01/23 0403 05/02/23 0444 05/03/23 0253 05/04/23 0424  WBC 29.4* 34.6* 26.1* 24.5* 16.9*  HGB 9.7* 10.1* 9.0* 9.0* 8.9*  HCT 28.8* 30.0* 26.8* 26.4* 26.7*  MCV 90.3 89.8 90.2 88.9 91.4  PLT 302 331 338 406* 428*    Basic Metabolic Panel: Recent Labs  Lab 04/29/23 0415 04/29/23 1543 04/30/23 0406 05/01/23 0403 05/02/23 0444 05/03/23 0253 05/04/23 0424  NA 131*   < > 133* 136 136 138 139  K 4.4   < > 3.6 3.8 3.7 3.8 3.5  CL 98   < > 99 100 100 101 102  CO2 24   < > 25 26 27 26 29   GLUCOSE 235*   < > 155* 189* 167* 205* 239*  BUN 38*   < > 51* 73* 89* 75* 77*  CREATININE 1.31*   < > 1.45* 1.32* 1.27* 1.08* 1.09*  CALCIUM 8.7*   < > 9.3 9.5 9.5 9.5 9.6  MG 2.3  --  2.0 2.0 1.7 1.9  --   PHOS 2.7   < > 2.5 2.9 3.6 3.4 2.4*   < > = values in this interval not displayed.   GFR: Estimated Creatinine Clearance: 64.1 mL/min (A) (by C-G formula based on SCr of 1.09 mg/dL (H)). Recent Labs  Lab 05/01/23 0403 05/02/23 0444 05/03/23 0253 05/04/23 0424  WBC 34.6* 26.1* 24.5* 16.9*    Liver Function Tests: Recent Labs  Lab 04/30/23 0406 05/01/23 0403 05/02/23 0444 05/03/23 0253 05/04/23 0424  ALBUMIN 2.2* 2.4* 2.3* 2.5* 2.6*   No results for input(s): "LIPASE", "AMYLASE" in the last 168 hours. No results for input(s): "AMMONIA" in the last 168 hours.  ABG    Component Value Date/Time   PHART 7.38 04/29/2023 1202   PCO2ART 46 04/29/2023 1202   PO2ART 83 04/29/2023 1202   HCO3 27.2 04/29/2023 1202   ACIDBASEDEF 3.2 (H) 04/27/2023 1551   O2SAT 98.9 04/29/2023 1202     Coagulation Profile: No results for input(s): "INR", "PROTIME" in the last 168 hours.   Cardiac Enzymes: No results for input(s): "CKTOTAL", "CKMB", "CKMBINDEX", "TROPONINI" in the last 168 hours.  HbA1C: Hemoglobin A1C  Date/Time Value Ref  Range Status  11/16/2013 04:11 PM 6.5 (H) 4.2 - 6.3 % Final    Comment:    The American Diabetes Association recommends that a primary goal of therapy should be <7% and that physicians should reevaluate the treatment regimen in patients with HbA1c values consistently >8%.    Hgb A1c MFr Bld  Date/Time Value Ref Range Status  04/25/2023 04:11 AM 6.5 (H) 4.8 - 5.6 % Final    Comment:    (NOTE) Pre diabetes:          5.7%-6.4%  Diabetes:              >6.4%  Glycemic control for   <7.0% adults with diabetes   05/04/2020 04:33 AM 8.0 (H) 4.8 - 5.6 % Final    Comment:    (NOTE) Pre diabetes:          5.7%-6.4%  Diabetes:              >6.4%  Glycemic control for   <7.0% adults with diabetes     CBG: Recent Labs  Lab 05/03/23 1539 05/03/23 1934 05/04/23 0013 05/04/23 0327 05/04/23 0740  GLUCAP 273* 215* 207* 208* 240*    Review of Systems:   Unable to assess due to AMS/intubation/sedation   Past Medical History:  She,  has a past medical history of Arthritis, Asthma, Cataract of  both eyes, Complication of anesthesia, COPD (chronic obstructive pulmonary disease) (HCC), Coronary artery disease, DDD (degenerative disc disease), lumbar, Diabetes mellitus without complication (HCC), Dyspnea, Hernia of abdominal wall, History of hiatal hernia, Hyperlipidemia, Hypertension, Hypothyroidism, Occasional tremors, and Oxygen desaturation.   Surgical History:   Past Surgical History:  Procedure Laterality Date   ABDOMINAL HYSTERECTOMY     CHOLECYSTECTOMY     COLONOSCOPY WITH PROPOFOL N/A 09/08/2014   Procedure: COLONOSCOPY WITH PROPOFOL;  Surgeon: Midge Minium, MD;  Location: [facility] ENDOSCOPY;  Service: Endoscopy;  Laterality: N/A;   GANGLION CYST EXCISION Right 08/15/2020   Procedure: Right index finger cyst removal;  Surgeon: Kennedy Bucker, MD;  Location: [facility] ORS;  Service: Orthopedics;  Laterality: Right;   HIATAL HERNIA REPAIR  2000   OTHER SURGICAL HISTORY     vocal cord  surgery due to injury during thyroid surgery   parathyroidectomy  2000   THYROIDECTOMY     TONSILLECTOMY     TUMOR REMOVAL Right Leg     Social History:   reports that she quit smoking about 7 years ago. Her smoking use included cigarettes. She started smoking about 55 years ago. She has a 48 pack-year smoking history. She has never used smokeless tobacco. She reports that she does not drink alcohol and does not use drugs.   Family History:  Her family history includes Breast cancer in her sister; Kidney disease in her mother.   Allergies No Known Allergies   Home Medications  Prior to Admission medications   Medication Sig Start Date End Date Taking? Authorizing Provider  MOUNJARO 10 MG/0.5ML Pen Inject 10 mg into the skin once a week. 03/27/23  Yes [provider]  albuterol (VENTOLIN HFA) 108 (90 Base) MCG/ACT inhaler Inhale 2 puffs into the lungs every 4 (four) hours as needed for wheezing or shortness of breath.    [provider]  allopurinol (ZYLOPRIM) 100 MG tablet Take 100 mg by mouth every morning.    [provider]  amLODipine (NORVASC) 5 MG tablet Take 5 mg by mouth every morning. 07/23/20   [provider]  atorvastatin (LIPITOR) 40 MG tablet Take 40 mg by mouth every morning.    [provider]  benazepril (LOTENSIN) 40 MG tablet Take 40 mg by mouth every morning.    [provider]  chlorhexidine (PERIDEX) 0.12 % solution Use as directed 15 mLs in the mouth or throat as needed.    [provider]  ELIQUIS 5 MG TABS tablet Take 5 mg by mouth 2 (two) times daily.    [provider]  fluticasone (FLONASE) 50 MCG/ACT nasal spray Place 2 sprays into both nostrils daily as needed for allergies. 03/31/20   [provider]  fluticasone-salmeterol (ADVAIR HFA) 161-09 MCG/ACT inhaler Inhale 2 puffs by mouth twice daily 07/07/21   Erin Fulling, MD  furosemide (LASIX) 80 MG tablet Take 80 mg by mouth every  morning.    [provider]  gabapentin (NEURONTIN) 600 MG tablet Take 600 mg by mouth 3 (three) times daily.    [provider]  hydrochlorothiazide (HYDRODIURIL) 25 MG tablet Take 25 mg by mouth every morning.    [provider]  HYDROcodone-acetaminophen (NORCO) 5-325 MG tablet Take 1 tablet by mouth every 6 (six) hours as needed for moderate pain. 08/15/20   Kennedy Bucker, MD  ibuprofen (ADVIL) 800 MG tablet Take 800 mg by mouth every 8 (eight) hours as needed for pain. 07/21/20   [provider]  indomethacin (INDOCIN) 25 MG capsule Take 25 mg by mouth daily.    [provider]  LANTUS SOLOSTAR 100 UNIT/ML Solostar Pen Inject 80 Units into the skin every morning. 05/16/20   [provider]  levothyroxine (SYNTHROID) 200 MCG tablet Take 200 mcg by mouth daily before breakfast.    [provider]  Melatonin 10 MG TABS Take 10 mg by mouth at bedtime. Patient not taking: Reported on 08/15/2020    [provider]  methocarbamol (ROBAXIN) 500 MG tablet Take 1-2 tablets by mouth every 6 (six) hours as needed for muscle spasms. 07/18/20   [provider]  Metoprolol Tartrate 75 MG TABS Take 1 tablet by mouth 2 (two) times daily.    [provider]  montelukast (SINGULAIR) 10 MG tablet Take 10 mg by mouth every morning.    [provider]  [facility] powder Apply 1 Application topically 2 (two) times daily.    [provider]  olopatadine (PATANOL) 0.1 % ophthalmic solution Place 1 drop into both eyes daily.    [provider]  OXYGEN Inhale 3-4 L into the lungs daily.    [provider]  promethazine (PHENERGAN) 25 MG tablet Take 25 mg by mouth every 6 (six) hours as needed for nausea or vomiting.    [provider]  psyllium (REGULOID) 0.52 g capsule Take 0.52 g by mouth 2 (two) times daily.    [provider]  Tiotropium Bromide Monohydrate (SPIRIVA RESPIMAT) 1.25  MCG/ACT AERS Inhale 2 puffs into the lungs daily for 30 doses. 10/03/20 11/02/20  Erin Fulling, MD  tiZANidine (ZANAFLEX) 4 MG tablet Take 4 mg by mouth at bedtime.    [provider]  tretinoin (RETIN-A) 0.05 % cream Apply to face qhs, wash off qam 02/18/22   Deirdre Evener, MD  TRULICITY 0.75 MG/0.5ML SOPN Inject 0.75 mg into the skin once a week. wednesday 07/08/20   [provider]  VALERIAN ROOT PO Take 1,200 mg by mouth at bedtime.    [provider]     Critical care time: 40 minutes   Jerelyn Trimarco S. Tukov ANP-BC Pulmonary and Critical Care Medicine [facility]   NB: This document was prepared using Dragon voice recognition software and may include unintentional dictation errors.

## 2023-05-04 NOTE — Plan of Care (Signed)
 Patient intubated and sedated, continues to require minimal amount of levophed to maintain a MAP >65. Propofol titrated down to 5 mcg/kg/ml ; fentanyl remains at 75 mcg/hr. Heparin continues to infuse at 16.5 ml/hr. Patient does open her eyes to voice and does attempt to wiggle her toes but will not grip or squeeze hand. Tube feeds infusing at goal rate of 50 ml/hr. UOP via foley cathter was 1700 ml this shift. Patients last BM was 04/30/23, sennakot is ordered daily. Oral care being provided with repositioning.  Problem: Education: Goal: Knowledge of General Education information will improve Description: Including pain rating scale, medication(s)/side effects and non-pharmacologic comfort measures Outcome: Progressing   Problem: Clinical Measurements: Goal: Ability to maintain clinical measurements within normal limits will improve Outcome: Not Progressing Goal: Will remain free from infection Outcome: Not Progressing Goal: Diagnostic test results will improve Outcome: Not Progressing Goal: Respiratory complications will improve Outcome: Not Progressing Goal: Cardiovascular complication will be avoided Outcome: Not Progressing   Problem: Activity: Goal: Risk for activity intolerance will decrease Outcome: Not Progressing   Problem: Nutrition: Goal: Adequate nutrition will be maintained Outcome: Progressing   Problem: Coping: Goal: Level of anxiety will decrease Outcome: Progressing   Problem: Elimination: Goal: Will not experience complications related to bowel motility Outcome: Not Progressing Goal: Will not experience complications related to urinary retention Outcome: Progressing   Problem: Pain Managment: Goal: General experience of comfort will improve and/or be controlled Outcome: Not Progressing   Problem: Safety: Goal: Ability to remain free from injury will improve Outcome: Progressing   Problem: Skin Integrity: Goal: Risk for impaired skin integrity will  decrease Outcome: Not Progressing

## 2023-05-04 NOTE — Consult Note (Signed)
 PHARMACY CONSULT NOTE - ELECTROLYTES  Pharmacy Consult for Electrolyte Monitoring and Replacement   Recent Labs:  Height: 5' 7.99" (172.7 cm) Weight: 112.7 kg (248 lb 7.3 oz) IBW/kg (Calculated) : 63.88 Estimated Creatinine Clearance: 64.1 mL/min (A) (by C-G formula based on SCr of 1.09 mg/dL (H)). Potassium (mmol/L)  Date Value  05/04/2023 3.5  11/16/2013 3.5   Magnesium (mg/dL)  Date Value  56/21/3086 1.9  11/16/2013 1.0 (L)   Calcium (mg/dL)  Date Value  57/84/6962 9.6   Calcium, Total (mg/dL)  Date Value  95/28/4132 9.1   Albumin (g/dL)  Date Value  44/03/270 2.6 (L)  11/16/2013 3.8   Phosphorus (mg/dL)  Date Value  53/66/4403 2.4 (L)   Sodium (mmol/L)  Date Value  05/04/2023 139  11/16/2013 133 (L)    Assessment  Sarah Norton is a 68 y.o. female presenting with respiratory failure and unresponsive. PMH significant for DM and COPD. Patient was started on CRRT 2/15 then taken off evening of 2/18. They continue to have good urine output. Nephrology has signed off. Pharmacy has been consulted to monitor and replace electrolytes while they're in the ICU.   Diet: NPO, intubated. Has OG Nutrition: Vital 50 mL/hr & free water 30 mL q4h Pertinent medications: Lasix 40 mg IV BID  Goal of Therapy: Electrolytes WNL   Plan:  --K 3.5, Kcl 40 mEq per tube BID x 2 doses --Phos 2.4, Phos-Nak 2 packets per tube x 1 --Follow-up electrolytes with AM labs tomorrow  Thank you for allowing pharmacy to be a part of this patient's care.  Tressie Ellis 05/04/2023 7:38 AM

## 2023-05-04 NOTE — Consult Note (Signed)
 PHARMACY - ANTICOAGULATION CONSULT NOTE  Pharmacy Consult for Heparin Infusion Indication: atrial fibrillation  Patient Measurements: Height: 5' 7.99" (172.7 cm) Weight: 112.7 kg (248 lb 7.3 oz) IBW/kg (Calculated) : 63.88 Heparin Dosing Weight: 88.8 kg  Labs: Recent Labs    05/02/23 0444 05/03/23 0253 05/04/23 0424  HGB 9.0* 9.0* 8.9*  HCT 26.8* 26.4* 26.7*  PLT 338 406* 428*  HEPARINUNFRC 0.39 0.39 0.31  CREATININE 1.27* 1.08* 1.09*   Estimated Creatinine Clearance: 64.1 mL/min (A) (by C-G formula based on SCr of 1.09 mg/dL (H)).  Medical History: Past Medical History:  Diagnosis Date   Arthritis    Asthma    Cataract of both eyes    Complication of anesthesia    woke up during surgery x1   COPD (chronic obstructive pulmonary disease) (HCC)    Coronary artery disease    DDD (degenerative disc disease), lumbar    Diabetes mellitus without complication (HCC)    Dyspnea    DUE TO COPD   Hernia of abdominal wall    History of hiatal hernia    small   Hyperlipidemia    Hypertension    Hypothyroidism    hashimotos throiditis  -thyroidectomy  2000   Occasional tremors    Oxygen desaturation    NOW PT ON 3-4 LITERS [place] DAILY   Pertinent Medications: PTA apixaban, last dose unknown  Last dose heparin sq 2/15 @0522 .   Assessment: 68 year old female with a past medical history significant for COPD on home supplemental oxygen and diabetes mellitus who presented to [facility] ED on 04/24/2023 due to unresponsiveness. PMH of afib on apixaban. CHADSVASc 3. Started on CRRT 2/15. Pharmacy was consulted to dose and manage this patient's heparin.   Date Time aPTT/HL Rate/Comment  2/15 1947 45/---  1200/SUBtherapeutic 2/16 0552 72/0.81 1500/aPTT therapeutic  2/16    1336     67/--                1500/aPTT therapeutic    2/16 2115 62/--  1500/aPTT SUBtherapeutic 2/17 0616 69/0.65 1650/both levels therapeutic x 1 2/17 1352 74/0.61 1650/both levels therapeutic x 2 2/18 0415 -- /  0.62 1650/therapeutic x 3 2/19 0406 -- / 0.42 1650/therapeutic x 4 2/20 0403 -- / 0.39 1650/therapeutic x 5 2/21 0444 -- / 0.39 1650/therapeutic x 6 2/22 0253 -- / 0.39 1650/therapeutic x 7 2/23 0424 -- / 0.31 1650/therapeutic x 8  Baseline Labs:  Hgb 9.5 Plt 325 INR/PT 1.1/14.3  HL 0.67 - possibly has some false elevated due to apixaban.   Goal of Therapy:  Heparin level 0.3-0.7 units/ml aPTT 66-102 seconds Monitor platelets by anticoagulation protocol: Yes  Plan:  Continue heparin infusion at 1650 units/hr Recheck HL daily w/ AM labs while therapeutic Monitor CBC and HL daily while on heparin   Tressie Ellis 05/04/2023 7:41 AM

## 2023-05-05 ENCOUNTER — Inpatient Hospital Stay: Payer: 59

## 2023-05-05 LAB — CBC
HCT: 31.5 % — ABNORMAL LOW (ref 36.0–46.0)
Hemoglobin: 10.5 g/dL — ABNORMAL LOW (ref 12.0–15.0)
MCH: 30.2 pg (ref 26.0–34.0)
MCHC: 33.3 g/dL (ref 30.0–36.0)
MCV: 90.5 fL (ref 80.0–100.0)
Platelets: 516 10*3/uL — ABNORMAL HIGH (ref 150–400)
RBC: 3.48 MIL/uL — ABNORMAL LOW (ref 3.87–5.11)
RDW: 16.9 % — ABNORMAL HIGH (ref 11.5–15.5)
WBC: 17.4 10*3/uL — ABNORMAL HIGH (ref 4.0–10.5)
nRBC: 0.1 % (ref 0.0–0.2)

## 2023-05-05 LAB — HEPARIN LEVEL (UNFRACTIONATED): Heparin Unfractionated: 0.33 [IU]/mL (ref 0.30–0.70)

## 2023-05-05 LAB — GLUCOSE, CAPILLARY
Glucose-Capillary: 111 mg/dL — ABNORMAL HIGH (ref 70–99)
Glucose-Capillary: 145 mg/dL — ABNORMAL HIGH (ref 70–99)
Glucose-Capillary: 152 mg/dL — ABNORMAL HIGH (ref 70–99)
Glucose-Capillary: 116 mg/dL — ABNORMAL HIGH (ref 70–99)
Glucose-Capillary: 94 mg/dL (ref 70–99)
Glucose-Capillary: 84 mg/dL (ref 70–99)
Glucose-Capillary: 82 mg/dL (ref 70–99)

## 2023-05-05 LAB — RENAL FUNCTION PANEL
Albumin: 3.3 g/dL — ABNORMAL LOW (ref 3.5–5.0)
Anion gap: 12 (ref 5–15)
BUN: 61 mg/dL — ABNORMAL HIGH (ref 8–23)
CO2: 33 mmol/L — ABNORMAL HIGH (ref 22–32)
Calcium: 10.1 mg/dL (ref 8.9–10.3)
Chloride: 101 mmol/L (ref 98–111)
Creatinine, Ser: 0.8 mg/dL (ref 0.44–1.00)
GFR, Estimated: >60 mL/min (ref >60)
Glucose, Bld: 103 mg/dL — ABNORMAL HIGH (ref 70–99)
Phosphorus: 3.3 mg/dL (ref 2.5–4.6)
Potassium: 3.7 mmol/L (ref 3.5–5.1)
Sodium: 146 mmol/L — ABNORMAL HIGH (ref 135–145)

## 2023-05-05 LAB — MAGNESIUM: Magnesium: 2 mg/dL (ref 1.7–2.4)

## 2023-05-05 MED ORDER — LORAZEPAM 2 MG/ML IJ SOLN
1.0000 mg | INTRAMUSCULAR | Status: DC | PRN
  Administered 2023-05-05 – 2023-05-06 (×3): 1 mg via INTRAVENOUS
  Filled 2023-05-05 (×3): qty 1

## 2023-05-05 MED ORDER — MAGNESIUM SULFATE 2 GM/50ML IV SOLN
2.0000 g | Freq: Once | INTRAVENOUS | Status: AC
  Administered 2023-05-05: 2 g via INTRAVENOUS
  Filled 2023-05-05: qty 50

## 2023-05-05 MED ORDER — ORAL CARE MOUTH RINSE: 15.0000 mL | OROMUCOSAL | Status: DC | PRN

## 2023-05-05 MED ORDER — POTASSIUM CHLORIDE 10 MEQ/100ML IV SOLN
10.0000 meq | INTRAVENOUS | Status: AC
Start: 2023-05-05 — End: 2023-05-05
  Administered 2023-05-05 (×2): 10 meq via INTRAVENOUS
  Filled 2023-05-05 (×2): qty 100

## 2023-05-05 NOTE — Plan of Care (Signed)
 Patient is alert to self, place, year and president on initial assessment and continued to be alert to self and place thru shift. Patient has an extremely hoarse/raspy voice. Patient denies pain. NP notified of ABG results and at bedside to assess patient. Patient has increased work of breathing, RR 24-32 with accessory muscle use noted and with morphine given PRN X's 2 this shift Patient is also on a precedex gtt at 0.6 mcg/kg/hr currently. Patient was alert with eyes open prior to increase but is easily aroused and drifts back to sleep quickly. Amiodarone gtt continues per order. Heparin continues to infuse at 16.5 ml/hr. UOP via foley cathter was 2450 ml this shift. Patients last BM was 05/04/23. Oral care being provided with repositioning.  Problem: Education: Goal: Knowledge of General Education information will improve Description: Including pain rating scale, medication(s)/side effects and non-pharmacologic comfort measures Outcome: Progressing   Problem: Clinical Measurements: Goal: Ability to maintain clinical measurements within normal limits will improve Outcome: Not Progressing Goal: Will remain free from infection Outcome: Not Progressing Goal: Diagnostic test results will improve Outcome: Not Progressing Goal: Respiratory complications will improve Outcome: Not Progressing Goal: Cardiovascular complication will be avoided Outcome: Not Progressing   Problem: Activity: Goal: Risk for activity intolerance will decrease Outcome: Not Progressing   Problem: Nutrition: Goal: Adequate nutrition will be maintained Outcome: Progressing   Problem: Coping: Goal: Level of anxiety will decrease Outcome: Progressing   Problem: Elimination: Goal: Will not experience complications related to bowel motility Outcome: Not Progressing Goal: Will not experience complications related to urinary retention Outcome: Progressing   Problem: Pain Managment: Goal: General experience of comfort  will improve and/or be controlled Outcome: Not Progressing   Problem: Safety: Goal: Ability to remain free from injury will improve Outcome: Progressing   Problem: Skin Integrity: Goal: Risk for impaired skin integrity will decrease Outcome: Not Progressing

## 2023-05-05 NOTE — Progress Notes (Signed)
 PT Cancellation Note  Patient Details Name: Sarah Norton MRN: [id number] DOB: Apr 21, 1955   Cancelled Treatment:    Reason Eval/Treat Not Completed: Other (comment). Consult received and chart reviewed. Per discussion with RN, pt just placed on bipap and not appropriate for PT at this time. Will re-attempt next date.   [name] 05/05/2023, 3:30 PM Elizabeth Palau, PT, DPT, G[facility] [phone]

## 2023-05-05 NOTE — Progress Notes (Signed)
 NAME:  [name], MRN:  [id number], DOB:  11-Jan-1956, LOS: 11 ADMISSION DATE:  04/24/2023, CONSULTATION DATE:  04/24/23 REFERRING MD:  Dr. Rosalia Hammers, CHIEF COMPLAINT:  Acute Respiratory Distress, hypoxia, AMS   Brief Pt Description / Synopsis:  68 y.o female admitted with Acute Metabolic Encephalopathy and Acute Hypoxic and Hypercapnic Respiratory Failure in the setting of Acute COPD Exacerbation due to Influenza A infection and questionable superimposed bacterial pneumonia, failed trial of BiPAP requiring intubation and mechanical ventilation.  Course complicated by AKI requiring initiation of CRRT.  History of Present Illness:  Sarah Norton is a 68 year old female with a past medical history significant for COPD on home supplemental oxygen and diabetes mellitus who presented to [facility] ED on 04/24/2023 due to unresponsiveness.  Upon EMS arrival she was noted to be hypoxic with O2 saturations in the 40s for which she was placed on CPAP with improvement in sats to the 80s.  Upon arrival to the ED she remained somnolent but slightly arousable, and given her hypoxia, she was transitioned to BiPAP.  Pt is currently intubated and sedated, and no other history is currently available.   ED Course: Initial Vital Signs: Temperature 96.5 F axillary, pulse 87, respiratory rate 33, blood pressure 136/97, SpO2 78% Significant Labs: Sodium 134, glucose 313, BUN 59, creatinine 1.27, BNP 436, high-sensitivity troponin 139, lactic acid 1.7, WBC 33.4, D-dimer 2.13 VBG: pH 7.19/pCO2 82/pO2 39/bicarb 31.3 Positive for influenza A Urinalysis negative for UTI Imaging Chest X-ray>>IMPRESSION: *Bibasilar heterogeneous opacities, favored to represent multilobar pneumonia. Follow-up to clearing is recommended. CTa Chest>> pending currently Medications Administered: 1 L normal saline bolus, 125 mg Solu-Medrol, IV azithromycin and ceftriaxone, Levophed drip initiated Despite BiPAP patient's ABG worsened with worsening mental  status.  Therefore ED provider elected to intubate.  PCCM is asked to admit for further workup and treatment. Please see "significant hospital events" section below for full detailed hospital course.   Pertinent  Medical History   Past Medical History:  Diagnosis Date   Arthritis    Asthma    Cataract of both eyes    Complication of anesthesia    woke up during surgery x1   COPD (chronic obstructive pulmonary disease) (HCC)    Coronary artery disease    DDD (degenerative disc disease), lumbar    Diabetes mellitus without complication (HCC)    Dyspnea    DUE TO COPD   Hernia of abdominal wall    History of hiatal hernia    small   Hyperlipidemia    Hypertension    Hypothyroidism    hashimotos throiditis  -thyroidectomy  2000   Occasional tremors    Oxygen desaturation    NOW PT ON 3-4 LITERS [place] DAILY    Micro Data:  2/13: COVID/FLU/RSV PCR>> + Influenza A 2/13: Blood culture x2>> no growth 2/13: Strep pneumo urinary antigen>> negative 2/13: Legionella urinary antigen>> negative 2/13: HIV screen>>negative 2/13: MRSA PCR>> negative 2/15: Tracheal aspirate>> no growth  Antimicrobials:   Anti-infectives (From admission, onward)    Start     Dose/Rate Route Frequency Ordered Stop   04/30/23 2200  ceFEPIme (MAXIPIME) 1 g in sodium chloride 0.9 % 100 mL IVPB  Status:  Discontinued        1 g 200 mL/hr over 30 Minutes Intravenous Every 24 hours 04/29/23 1842 04/30/23 1006   04/29/23 1145  linezolid (ZYVOX) tablet 600 mg  Status:  Discontinued        600 mg Per Tube Every 12 hours  04/29/23 1052 04/30/23 1006   04/28/23 1015  vancomycin (VANCOREADY) IVPB 1250 mg/250 mL        1,250 mg 166.7 mL/hr over 90 Minutes Intravenous  Once 04/28/23 0921 04/28/23 1209   04/28/23 1000  oseltamivir (TAMIFLU) 6 MG/ML suspension 30 mg  Status:  Discontinued        30 mg Per Tube Daily 04/28/23 0737 04/28/23 0747   04/28/23 1000  oseltamivir (TAMIFLU) 6 MG/ML suspension 75 mg        75  mg Per Tube Daily 04/28/23 0747 04/29/23 1155   04/27/23 1000  oseltamivir (TAMIFLU) 6 MG/ML suspension 75 mg  Status:  Discontinued        75 mg Per Tube Daily 04/26/23 1834 04/28/23 0737   04/27/23 0815  vancomycin (VANCOCIN) IVPB 1000 mg/200 mL premix        1,000 mg 200 mL/hr over 60 Minutes Intravenous  Once 04/27/23 0724 04/27/23 0921   04/26/23 2200  ceFEPIme (MAXIPIME) 2 g in sodium chloride 0.9 % 100 mL IVPB  Status:  Discontinued        2 g 200 mL/hr over 30 Minutes Intravenous Every 12 hours 04/26/23 1834 04/29/23 1842   04/26/23 1000  oseltamivir (TAMIFLU) 6 MG/ML suspension 30 mg  Status:  Discontinued        30 mg Per Tube Daily 04/25/23 1017 04/26/23 1834   04/25/23 2000  vancomycin (VANCOREADY) IVPB 1250 mg/250 mL  Status:  Discontinued        1,250 mg 166.7 mL/hr over 90 Minutes Intravenous Every 24 hours 04/24/23 2106 04/25/23 1151   04/25/23 1300  ceFEPIme (MAXIPIME) 2 g in sodium chloride 0.9 % 100 mL IVPB  Status:  Discontinued        2 g 200 mL/hr over 30 Minutes Intravenous Every 24 hours 04/25/23 1157 04/26/23 1834   04/25/23 1200  azithromycin (ZITHROMAX) 500 mg in sodium chloride 0.9 % 250 mL IVPB        500 mg 250 mL/hr over 60 Minutes Intravenous Every 24 hours 04/24/23 1557 04/28/23 1312   04/25/23 1150  vancomycin variable dose per unstable renal function (pharmacist dosing)  Status:  Discontinued         Does not apply See admin instructions 04/25/23 1151 04/29/23 1052   04/25/23 1100  cefTRIAXone (ROCEPHIN) 2 g in sodium chloride 0.9 % 100 mL IVPB  Status:  Discontinued        2 g 200 mL/hr over 30 Minutes Intravenous Every 24 hours 04/24/23 1557 04/25/23 1021   04/24/23 2200  oseltamivir (TAMIFLU) 6 MG/ML suspension 30 mg  Status:  Discontinued        30 mg Per Tube 2 times daily 04/24/23 1559 04/25/23 1017   04/24/23 2045  vancomycin (VANCOREADY) IVPB 2000 mg/400 mL        2,000 mg 200 mL/hr over 120 Minutes Intravenous  Once 04/24/23 2034 04/25/23 0045    04/24/23 1245  cefTRIAXone (ROCEPHIN) 2 g in sodium chloride 0.9 % 100 mL IVPB        2 g 200 mL/hr over 30 Minutes Intravenous Once 04/24/23 1244 04/24/23 1407   04/24/23 1245  azithromycin (ZITHROMAX) 500 mg in sodium chloride 0.9 % 250 mL IVPB        500 mg 250 mL/hr over 60 Minutes Intravenous  Once 04/24/23 1244 04/24/23 1513       Significant Hospital Events: Including procedures, antibiotic start and stop dates in addition to other pertinent events  2/13: Presented to ED with AMS, acute respiratory distress and hypoxia.  Initially trialed on BiPAP but with worsening ABG and mental status.  ED provider intubated.  PCCM asked to admit. 04/25/2023 - Patient with improving respiratory status however with worsening wbc and procalcitonin.  04/26/2023 - HD cath placed and started on CRRT. 2/17 remains on vent, on CRRT 02/18: CRRT discontinued  02/19: On minimal vent settings.  Plan for WUA/SBT as tolerated utilizing Precedex.  With SBT, increased RR/WOB/accessory muscle use.  Adequate urine output and electrolytes acceptable, no plan for HD today. 02/20: Remains on minimal vent support, SBT as tolerated. On WUA with Precedex, she opens eyes but not following commands,  MRI Brain negative for acute intracranial abnormality. Faild SBT due to tachypnea (RR 40's) and increased WOB 02/21: No significant events noted overnight.  Afebrile, hemodynamically stable, not requiring vasopressors. Leukocytosis improving. On minimal vent support, plan for WUA and SBT as tolerated ~ failed SBT due to tachypnea (RR 40's) and increased WOB.  Not following commands on WUA, will obtain EEG. 05/03/2023: Failed SBT due to altered mental status.   05/04/23: Tolerated SBT and was extubated to HF [place] 05/05/23- patient passed SLP for ice chips only thus far. Remains on HHFNC weaned to 40/40, drowsy during interview.   Interim History / Subjective:  No acute events overnight. More awake and following commands even  though responses are slow. Urine output improved. Off pressors. BUN trending up but creatinine is trending down 1.45 to 1.09.  Successfully passed SBT and was extubated.  Postextubation patient had increased work of breathing and episodes of low SpO2.  She was placed on humidified high flow and currently saturating at 95% and above.  She is still working hard to breathe.  She was given Lasix and a dose of albumin.  Urine output continues to be optimal.  Only concern is patient's increased work of breathing.  She is on morphine 1 to 2 mg every 3 hours as needed.  Patient has a history of anxiety and get anxious easily.  She had not have a bowel movement in 3 days and she was given a dose of lactulose with positive effect.  Patient has had 2 extra large bowel movements.  Patient also had results of PVCs with intermittent a flutter.  She was started on amiodarone infusion with no bolus.  Heart rate is currently stable and fluctuating between 80 to 110 bpm.  Blood pressures this afternoon are running in the high end with systolic blood pressures between 140 and 160. Addendum: Patient's ABG shows worsening metabolic alkalosis.  Patient started on Precedex.  Continue as needed morphine.  Repeat blood gas at 2000. Objective   Blood pressure (!) 147/77, pulse 72, temperature 98.2 F (36.8 C), temperature source Axillary, resp. rate (!) 30, height 5' 7.99" (1.727 m), weight 112.7 kg, SpO2 94%.    Vent Mode: PSV FiO2 (%):  [35 %-100 %] 40 % PEEP:  [5 cmH20] 5 cmH20 Pressure Support:  [5 cmH20] 5 cmH20   Intake/Output Summary (Last 24 hours) at 05/05/2023 0809 Last data filed at 05/05/2023 0700 Gross per 24 hour  Intake 1189.9 ml  Output 6451 ml  Net -5261.1 ml   Filed Weights   05/02/23 0500 05/03/23 0458 05/04/23 0500  Weight: 113.2 kg 114.1 kg 112.7 kg    Examination: General: Patient is alert and following basic commands NAD HENT: Atraumatic, normocephalic, PERRLA, neck supple, difficult to assess  JVD due to body habitus and bilateral internal jugular  central line/HD catheters.   Lungs: Bilateral breath sounds with decreased air entry Cardiovascular: Irregularly irregular rhythm, rate controlled, no murmurs, rubs, gallops Abdomen: Obese, soft, distended, hypoactive bowel sounds, no pain with palpation Extremities: Normal bulk and tone, no deformities, 1+edema to bilateral lower extremities, venous stasis discoloration bilateral lower extremities Neuro: Awake,alert no FND grossly GU: Foley catheter removed,   Assessment & Plan:   #Acute Hypoxic & Hypercapnic Respiratory Failure in the setting of AECOPD, Influenza infection, and questionable superimposed bacterial pneumonia CTa Chest on 2/13: negative for PE, AAA or dissection. Concerning for interstitial and alveolar edema vs pneumonitis, and bibasilar consolidation (R >L). -Successfully extubated. - Continue aggressive pulmonary toileting -Follow intermittent Chest X-ray & ABG as needed -Bronchodilators as needed -Monitor fever curve - Antibiotics and Tamiflu completed -Patient is at increased risk for reintubation.  Please monitor respiratory status closely.   #Shock: Suspect Septic ~resolved  #Elevated Troponin in setting of demand ischemia  #Atrial Fibrillation with RVR ~ RATE CONTROLLED Echocardiogram 04/25/23: LVEF 60-65%, mild LVH, normal diastolic parameters, RV systolic function is normal, moderately elevated pulmonary artery systolic pressure, mild to moderate AS -Continuous cardiac monitoring -Maintain MAP >65 -IV fluids -Off pressors -Continue to monitor blood pressure closely -Diuresis as BP and renal function permits  -Continue Heparin gtt: Dosing per Pharmacy -Repeat ABG at 2000 -Precedex for anxiety and respiratory distress  #Meets SIRS Criteria (RR 34, WBC 33.4) #Severe Sepsis in setting of Influenza A infection and questionable superimposed bacterial pneumonia ~ TREATED  -Trend WBC's -Follow cultures as  above -Complete course of Cefepime and Linezolid on 2/19 -Continue to monitor fever curve and reculture as needed  #Acute Kidney Injury on CKD Stage IIIa ~ IMPROVING #Mild Hyponatremia ~ RESOLVED -Monitor I&O's / urinary output -Creatinine trending down but BUN is trending up; trend both indices and reconsult nephrology as needed --Avoid nephrotoxic agents  -Replace electrolytes as indicated ~ Pharmacy following for assistance with electrolyte replacement -Follow-up with nephrology regarding need for further hemodialysis  #Diabetes Mellitus -CBG's q4h; Target range of 140 to 180 -SSI -Follow ICU Hypo/Hyperglycemia protocol  #Acute Metabolic Encephalopathy, suspect CO2 Narcosis #Sedation needs in setting of mechanical ventilation -CT Head on 2/13 negative for acute intracranial process -MRI Brain on 2/20 negative for acute intracranial process -Treatment of metabolic derangements, sepsis, and hypercapnia as outlined above -Mental status is improving -Avoid sedation -Avoid constipation and give lactulose as needed for no bowel movement in more than 2 days -Consider EEG if mental status fails to improve with improved kidney function and and resolution of shock  Best Practice (right click and "Reselect all SmartList Selections" daily)   Diet/type: N.p.o.; awaiting speech and swallow eval DVT prophylaxis: Heparin gtt GI prophylaxis: PPI Lines: HD cathete I think the patient did not get pulled by procedure r, still needed Foley:  Yes, and it is still needed Code Status:  full code Last date of multidisciplinary goals of care discussion [2/23] at bedside with patient's significant other state  Labs   CBC: Recent Labs  Lab 05/01/23 0403 05/02/23 0444 05/03/23 0253 05/04/23 0424 05/05/23 0315  WBC 34.6* 26.1* 24.5* 16.9* 17.4*  HGB 10.1* 9.0* 9.0* 8.9* 10.5*  HCT 30.0* 26.8* 26.4* 26.7* 31.5*  MCV 89.8 90.2 88.9 91.4 90.5  PLT 331 338 406* 428* 516*    Basic Metabolic  Panel: Recent Labs  Lab 05/01/23 0403 05/02/23 0444 05/03/23 0253 05/04/23 0424 05/04/23 1134 05/05/23 0315  NA 136 136 138 139 145 146*  K 3.8 3.7 3.8  3.5 4.2 3.7  CL 100 100 101 102 100 101  CO2 26 27 26 29 31  33*  GLUCOSE 189* 167* 205* 239* 204* 103*  BUN 73* 89* 75* 77* 76* 61*  CREATININE 1.32* 1.27* 1.08* 1.09* 1.02* 0.80  CALCIUM 9.5 9.5 9.5 9.6 10.4* 10.1  MG 2.0 1.7 1.9  --  1.8 2.0  PHOS 2.9 3.6 3.4 2.4* 3.2 3.3   GFR: Estimated Creatinine Clearance: 87.4 mL/min (by C-G formula based on SCr of 0.8 mg/dL). Recent Labs  Lab 05/02/23 0444 05/03/23 0253 05/04/23 0424 05/05/23 0315  WBC 26.1* 24.5* 16.9* 17.4*    Liver Function Tests: Recent Labs  Lab 05/02/23 0444 05/03/23 0253 05/04/23 0424 05/04/23 1134 05/05/23 0315  AST  --   --   --  50*  --   ALT  --   --   --  92*  --   ALKPHOS  --   --   --  155*  --   BILITOT  --   --   --  1.0  --   PROT  --   --   --  7.6  --   ALBUMIN 2.3* 2.5* 2.6* 3.5 3.3*   No results for input(s): "LIPASE", "AMYLASE" in the last 168 hours. No results for input(s): "AMMONIA" in the last 168 hours.  ABG    Component Value Date/Time   PHART 7.54 (H) 05/04/2023 2012   PCO2ART 46 05/04/2023 2012   PO2ART 82 (L) 05/04/2023 2012   HCO3 39.3 (H) 05/04/2023 2012   ACIDBASEDEF 3.2 (H) 04/27/2023 1551   O2SAT 99 05/04/2023 2012     Coagulation Profile: No results for input(s): "INR", "PROTIME" in the last 168 hours.   Cardiac Enzymes: No results for input(s): "CKTOTAL", "CKMB", "CKMBINDEX", "TROPONINI" in the last 168 hours.  HbA1C: Hemoglobin A1C  Date/Time Value Ref Range Status  11/16/2013 04:11 PM 6.5 (H) 4.2 - 6.3 % Final    Comment:    The American Diabetes Association recommends that a primary goal of therapy should be <7% and that physicians should reevaluate the treatment regimen in patients with HbA1c values consistently >8%.    Hgb A1c MFr Bld  Date/Time Value Ref Range Status  04/25/2023 04:11 AM  6.5 (H) 4.8 - 5.6 % Final    Comment:    (NOTE) Pre diabetes:          5.7%-6.4%  Diabetes:              >6.4%  Glycemic control for   <7.0% adults with diabetes   05/04/2020 04:33 AM 8.0 (H) 4.8 - 5.6 % Final    Comment:    (NOTE) Pre diabetes:          5.7%-6.4%  Diabetes:              >6.4%  Glycemic control for   <7.0% adults with diabetes     CBG: Recent Labs  Lab 05/04/23 1610 05/04/23 1938 05/04/23 2344 05/05/23 0353 05/05/23 0755  GLUCAP 295* 177* 140* 111* 145*    Review of Systems:   Unable to assess due to AMS/intubation/sedation   Past Medical History:  She,  has a past medical history of Arthritis, Asthma, Cataract of both eyes, Complication of anesthesia, COPD (chronic obstructive pulmonary disease) (HCC), Coronary artery disease, DDD (degenerative disc disease), lumbar, Diabetes mellitus without complication (HCC), Dyspnea, Hernia of abdominal wall, History of hiatal hernia, Hyperlipidemia, Hypertension, Hypothyroidism, Occasional tremors, and Oxygen desaturation.   Surgical History:  Past Surgical History:  Procedure Laterality Date   ABDOMINAL HYSTERECTOMY     CHOLECYSTECTOMY     COLONOSCOPY WITH PROPOFOL N/A 09/08/2014   Procedure: COLONOSCOPY WITH PROPOFOL;  Surgeon: Midge Minium, MD;  Location: [facility] ENDOSCOPY;  Service: Endoscopy;  Laterality: N/A;   GANGLION CYST EXCISION Right 08/15/2020   Procedure: Right index finger cyst removal;  Surgeon: Kennedy Bucker, MD;  Location: [facility] ORS;  Service: Orthopedics;  Laterality: Right;   HIATAL HERNIA REPAIR  2000   OTHER SURGICAL HISTORY     vocal cord surgery due to injury during thyroid surgery   parathyroidectomy  2000   THYROIDECTOMY     TONSILLECTOMY     TUMOR REMOVAL Right Leg     Social History:   reports that she quit smoking about 7 years ago. Her smoking use included cigarettes. She started smoking about 55 years ago. She has a 48 pack-year smoking history. She has never used smokeless  tobacco. She reports that she does not drink alcohol and does not use drugs.   Family History:  Her family history includes Breast cancer in her sister; Kidney disease in her mother.   Allergies No Known Allergies   Home Medications  Prior to Admission medications   Medication Sig Start Date End Date Taking? Authorizing Provider  MOUNJARO 10 MG/0.5ML Pen Inject 10 mg into the skin once a week. 03/27/23  Yes [provider]  albuterol (VENTOLIN HFA) 108 (90 Base) MCG/ACT inhaler Inhale 2 puffs into the lungs every 4 (four) hours as needed for wheezing or shortness of breath.    [provider]  allopurinol (ZYLOPRIM) 100 MG tablet Take 100 mg by mouth every morning.    [provider]  amLODipine (NORVASC) 5 MG tablet Take 5 mg by mouth every morning. 07/23/20   [provider]  atorvastatin (LIPITOR) 40 MG tablet Take 40 mg by mouth every morning.    [provider]  benazepril (LOTENSIN) 40 MG tablet Take 40 mg by mouth every morning.    [provider]  chlorhexidine (PERIDEX) 0.12 % solution Use as directed 15 mLs in the mouth or throat as needed.    [provider]  ELIQUIS 5 MG TABS tablet Take 5 mg by mouth 2 (two) times daily.    [provider]  fluticasone (FLONASE) 50 MCG/ACT nasal spray Place 2 sprays into both nostrils daily as needed for allergies. 03/31/20   [provider]  fluticasone-salmeterol (ADVAIR HFA) 130-86 MCG/ACT inhaler Inhale 2 puffs by mouth twice daily 07/07/21   Erin Fulling, MD  furosemide (LASIX) 80 MG tablet Take 80 mg by mouth every morning.    [provider]  gabapentin (NEURONTIN) 600 MG tablet Take 600 mg by mouth 3 (three) times daily.    [provider]  hydrochlorothiazide (HYDRODIURIL) 25 MG tablet Take 25 mg by mouth every morning.    [provider]  HYDROcodone-acetaminophen (NORCO) 5-325 MG tablet Take 1 tablet by mouth every 6 (six) hours as  needed for moderate pain. 08/15/20   Kennedy Bucker, MD  ibuprofen (ADVIL) 800 MG tablet Take 800 mg by mouth every 8 (eight) hours as needed for pain. 07/21/20   [provider]  indomethacin (INDOCIN) 25 MG capsule Take 25 mg by mouth daily.    [provider]  LANTUS SOLOSTAR 100 UNIT/ML Solostar Pen Inject 80 Units into the skin every morning. 05/16/20   [provider]  levothyroxine (SYNTHROID) 200 MCG tablet Take 200 mcg  by mouth daily before breakfast.    [provider]  Melatonin 10 MG TABS Take 10 mg by mouth at bedtime. Patient not taking: Reported on 08/15/2020    [provider]  methocarbamol (ROBAXIN) 500 MG tablet Take 1-2 tablets by mouth every 6 (six) hours as needed for muscle spasms. 07/18/20   [provider]  Metoprolol Tartrate 75 MG TABS Take 1 tablet by mouth 2 (two) times daily.    [provider]  montelukast (SINGULAIR) 10 MG tablet Take 10 mg by mouth every morning.    [provider]  [facility] powder Apply 1 Application topically 2 (two) times daily.    [provider]  olopatadine (PATANOL) 0.1 % ophthalmic solution Place 1 drop into both eyes daily.    [provider]  OXYGEN Inhale 3-4 L into the lungs daily.    [provider]  promethazine (PHENERGAN) 25 MG tablet Take 25 mg by mouth every 6 (six) hours as needed for nausea or vomiting.    [provider]  psyllium (REGULOID) 0.52 g capsule Take 0.52 g by mouth 2 (two) times daily.    [provider]  Tiotropium Bromide Monohydrate (SPIRIVA RESPIMAT) 1.25 MCG/ACT AERS Inhale 2 puffs into the lungs daily for 30 doses. 10/03/20 11/02/20  Erin Fulling, MD  tiZANidine (ZANAFLEX) 4 MG tablet Take 4 mg by mouth at bedtime.    [provider]  tretinoin (RETIN-A) 0.05 % cream Apply to face qhs, wash off qam 02/18/22   Deirdre Evener, MD  TRULICITY 0.75 MG/0.5ML SOPN Inject 0.75 mg into the skin once a  week. wednesday 07/08/20   [provider]  VALERIAN ROOT PO Take 1,200 mg by mouth at bedtime.    [provider]     Critical care provider statement:   Total critical care time: 33 minutes   Performed by: Karna Christmas MD   Critical care time was exclusive of separately billable procedures and treating other patients.   Critical care was necessary to treat or prevent imminent or life-threatening deterioration.   Critical care was time spent personally by me on the following activities: development of treatment plan with patient and/or surrogate as well as nursing, discussions with consultants, evaluation of patient's response to treatment, examination of patient, obtaining history from patient or surrogate, ordering and performing treatments and interventions, ordering and review of laboratory studies, ordering and review of radiographic studies, pulse oximetry and re-evaluation of patient's condition.    Vida Rigger, M.D.  Pulmonary & Critical Care Medicine

## 2023-05-05 NOTE — Consult Note (Signed)
 PHARMACY - ANTICOAGULATION CONSULT NOTE  Pharmacy Consult for Heparin Infusion Indication: atrial fibrillation  Patient Measurements: Height: 5' 7.99" (172.7 cm) Weight: 112.7 kg (248 lb 7.3 oz) IBW/kg (Calculated) : 63.88 Heparin Dosing Weight: 88.8 kg  Labs: Recent Labs    05/03/23 0253 05/04/23 0424 05/04/23 1134 05/05/23 0315  HGB 9.0* 8.9*  --  10.5*  HCT 26.4* 26.7*  --  31.5*  PLT 406* 428*  --  516*  HEPARINUNFRC 0.39 0.31  --  0.33  CREATININE 1.08* 1.09* 1.02* 0.80   Estimated Creatinine Clearance: 87.4 mL/min (by C-G formula based on SCr of 0.8 mg/dL).  Medical History: Past Medical History:  Diagnosis Date   Arthritis    Asthma    Cataract of both eyes    Complication of anesthesia    woke up during surgery x1   COPD (chronic obstructive pulmonary disease) (HCC)    Coronary artery disease    DDD (degenerative disc disease), lumbar    Diabetes mellitus without complication (HCC)    Dyspnea    DUE TO COPD   Hernia of abdominal wall    History of hiatal hernia    small   Hyperlipidemia    Hypertension    Hypothyroidism    hashimotos throiditis  -thyroidectomy  2000   Occasional tremors    Oxygen desaturation    NOW PT ON 3-4 LITERS [place] DAILY   Pertinent Medications: PTA apixaban, last dose unknown  Last dose heparin sq 2/15 @0522 .   Assessment: 68 year old female with a past medical history significant for COPD on home supplemental oxygen and diabetes mellitus who presented to [facility] ED on 04/24/2023 due to unresponsiveness. PMH of afib on apixaban. CHADSVASc 3. Started on CRRT 2/15. Pharmacy was consulted to dose and manage this patient's heparin.   Date Time aPTT/HL Rate/Comment  2/15 1947 45/---  1200/SUBtherapeutic 2/16 0552 72/0.81 1500/aPTT therapeutic  2/16    1336     67/--                1500/aPTT therapeutic    2/16 2115 62/--  1500/aPTT SUBtherapeutic 2/17 0616 69/0.65 1650/both levels therapeutic x 1 2/17 1352 74/0.61 1650/both levels  therapeutic x 2 2/18 0415 -- / 0.62 1650/therapeutic x 3 2/19 0406 -- / 0.42 1650/therapeutic x 4 2/20 0403 -- / 0.39 1650/therapeutic x 5 2/21 0444 -- / 0.39 1650/therapeutic x 6 2/22 0253 -- / 0.39 1650/therapeutic x 7 2/23 0424 -- / 0.31 1650/therapeutic x 8 2/24 0315 -- / 0.33 1650/therapeutic x9  Baseline Labs:  Hgb 9.5 Plt 325 INR/PT 1.1/14.3  HL 0.67 - possibly has some false elevated due to apixaban.   Goal of Therapy:  Heparin level 0.3-0.7 units/ml aPTT 66-102 seconds Monitor platelets by anticoagulation protocol: Yes  Plan:  Continue heparin infusion at 1650 units/hr Recheck HL daily w/ AM labs while therapeutic Monitor CBC and HL daily while on heparin   Otelia Sergeant, PharmD, [facility] 05/05/2023 5:28 AM

## 2023-05-05 NOTE — Progress Notes (Addendum)
 Nutrition Follow-up  DOCUMENTATION CODES:   Obesity unspecified  INTERVENTION:   RD will add supplements with diet advancement   MVI po daily with diet advancement   Vitamin C 500mg  po BID with diet advancement   Pt remains at refeed risk  NUTRITION DIAGNOSIS:   Inadequate oral intake related to inability to eat as evidenced by NPO status. -ongoing   GOAL:   Patient will meet greater than or equal to 90% of their needs -previously met with tube feeds   MONITOR:   Diet advancement, Labs, Weight trends, I & O's, Skin  ASSESSMENT:   68 y/o female with h/o DM, HTN, thyroid disease s/p thyroidectomy, COPD, hiatal hernia s/p repair, polycythemia, HLD, DDD, Afib, CAD and CKD III who is admitted with Flu A, AKI, sepsis, shock, PNA and COPD exacerbation.  Pt extubated yesterday. Visited pt's room today. Pt resting on precedex. Pt seen by SLP and was approved for ice chips only. RD will add supplements with diet advancement. Pt remains at refeed risk. Pt with mild hypernatremia today; pt did received lasix yesterday. CRRT discontinued. Per chart, pt remains up ~7lbs since admission. Pt -2.6L on her I & Os. RD will monitor for diet advancement.    Medications reviewed and include: insulin, synthroid, MVI, protonix, senokot, precedex, heparin  Labs reviewed: Na 146(H), K 3.7 wnl, BUN 61(H) Wbc- 17.4(H), Hgb 10.5(L), Hct 31.5(L) Cbgs- 152, 145, 111 x 24 hrs   UOP-   Nutrition Focused Physical Exam:  Flowsheet Row Most Recent Value  Orbital Region No depletion  Upper Arm Region No depletion  Thoracic and Lumbar Region No depletion  Buccal Region No depletion  Temple Region No depletion  Clavicle Bone Region No depletion  Clavicle and Acromion Bone Region No depletion  Scapular Bone Region No depletion  Dorsal Hand No depletion  Patellar Region No depletion  Anterior Thigh Region No depletion  Posterior Calf Region No depletion  Edema (RD Assessment) Mild  Hair  Reviewed  Eyes Reviewed  Mouth Reviewed  Skin Reviewed  Nails Reviewed   Diet Order:   Diet Order             Diet NPO time specified Except for: Ice Chips  Diet effective now                  EDUCATION NEEDS:   Not appropriate for education at this time  Skin:  Skin Assessment: Reviewed RN Assessment (blanchable redness anus)  Last BM:  2/23- type 7  Height:   Ht Readings from Last 1 Encounters:  05/02/23 5' 7.99" (1.727 m)    Weight:   Wt Readings from Last 1 Encounters:  05/04/23 112.7 kg    Ideal Body Weight:  63.6 kg  BMI:  Body mass index is 37.79 kg/m.  Estimated Nutritional Needs:   Kcal:  2100-2400kcal/day  Protein:  110-120g/day  Fluid:  1.7-1.9L/day  Betsey Holiday MS, RD, LDN If unable to be reached, please send secure chat to "RD inpatient" available from 8:00a-4:00p daily

## 2023-05-05 NOTE — TOC Initial Note (Signed)
 Transition of Care [facility]) - Progression Note    Patient Details  Name: Sarah Norton MRN: [id number] Date of Birth: 26-Dec-1955  [facility] CM/SW Contact  Garret Reddish, RN Phone Number: 05/05/2023, 11:43 AM  Clinical Narrative:    Chart reviewed. Noted that patient was admitted with Acute Respiratory failure with hypoxia and hypercarbia.  Patient was extubated on -05-04-23.    I have spoken with patient's son Sarah Norton.  Sarah Norton informs me that prior to admission patient lived at home with her significant other.  Sarah Norton reports that patient was able to get around with a rolling walker.  Sarah Norton also reports that patient was on home 02 but was unsure of the company that supplied the home 02. Patient was able to bath and dress herself prior to admission.  Sarah Norton reports that patient did not have any services in the home .  Patient was still driving and able to take herself to appointments.  Patient does have a PCP and medications are affordable. I have introduced myself to [place] and explained TOC role.   Patient still requiring ICU level of care.  Patient remains on IV Precedex for anxiety and increase work of breathing.  Team to wean.  Patient is off pressors support at this time.  Patient did require CRRT due to AKI, during admission and kidney function has improved. Nephrology has signed off and can be re consulted if needed.   TOC will continue to follow for discharge planning.     Expected Discharge Plan:  (TBD) Barriers to Discharge: No Barriers Identified  Expected Discharge Plan and Services   Discharge Planning Services: CM Consult   Living arrangements for the past 2 months: Skilled Nursing Facility                                       Social Determinants of Health (SDOH) Interventions SDOH Screenings   Food Insecurity: Patient Unable To Answer (04/24/2023)  Housing: Patient Unable To Answer (04/24/2023)  Transportation Needs: Patient Unable To Answer  (04/24/2023)  Utilities: Patient Unable To Answer (04/24/2023)  Social Connections: Patient Unable To Answer (04/24/2023)  Tobacco Use: Medium Risk (05/17/2022)    Readmission Risk Interventions     No data to display

## 2023-05-05 NOTE — Evaluation (Signed)
 Clinical/Bedside Swallow Evaluation Patient Details  Name: Sarah Norton MRN: [id number] Date of Birth: 1955-06-22  Today's Date: 05/05/2023 Time: SLP Start Time (ACUTE ONLY): 0820 SLP Stop Time (ACUTE ONLY): 0850 SLP Time Calculation (min) (ACUTE ONLY): 30 min  Past Medical History:  Past Medical History:  Diagnosis Date   Arthritis    Asthma    Cataract of both eyes    Complication of anesthesia    woke up during surgery x1   COPD (chronic obstructive pulmonary disease) (HCC)    Coronary artery disease    DDD (degenerative disc disease), lumbar    Diabetes mellitus without complication (HCC)    Dyspnea    DUE TO COPD   Hernia of abdominal wall    History of hiatal hernia    small   Hyperlipidemia    Hypertension    Hypothyroidism    hashimotos throiditis  -thyroidectomy  2000   Occasional tremors    Oxygen desaturation    NOW PT ON 3-4 LITERS [place] DAILY   Past Surgical History:  Past Surgical History:  Procedure Laterality Date   ABDOMINAL HYSTERECTOMY     CHOLECYSTECTOMY     COLONOSCOPY WITH PROPOFOL N/A 09/08/2014   Procedure: COLONOSCOPY WITH PROPOFOL;  Surgeon: Midge Minium, MD;  Location: [facility] ENDOSCOPY;  Service: Endoscopy;  Laterality: N/A;   GANGLION CYST EXCISION Right 08/15/2020   Procedure: Right index finger cyst removal;  Surgeon: Kennedy Bucker, MD;  Location: [facility] ORS;  Service: Orthopedics;  Laterality: Right;   HIATAL HERNIA REPAIR  2000   OTHER SURGICAL HISTORY     vocal cord surgery due to injury during thyroid surgery   parathyroidectomy  2000   THYROIDECTOMY     TONSILLECTOMY     TUMOR REMOVAL Right Leg   HPI:  Sarah Norton is a 68 year old female with a past medical history significant for COPD on home supplemental oxygen and diabetes mellitus who presented to [facility] ED on 04/24/2023 due to unresponsiveness. Upon EMS arrival she was noted to be hypoxic with O2 saturations in the 40s for which she was placed on CPAP with improvement in sats to the 80s.  Upon arrival to the ED she remained somnolent but slightly arousable, and given her hypoxia, she was transitioned to BiPAP. Pt admitted with Acute Metabolic Encephalopathy and Acute Hypoxic and Hypercapnic Respiratory Failure in the setting of Acute COPD Exacerbation due to Influenza A infection and questionable superimposed bacterial pneumonia, failed trial of BiPAP requiring intubation and mechanical ventilation. Course complicated by AKI requiring initiation of CRRT. Pt intubated 2/13-2/23. MRI Brain on 2/20 negative for acute intracranial process CXR 2/22: Minimal bibasilar subsegmental atelectasis. Pt is currently NPO.    Assessment / Plan / Recommendation  Clinical Impression  Pt seen for bedside swallow assessment s/p extubation on 2/23. Pt is currently on high flow nasal canula set to FiO2 38% and 40L. O2 saturations maintained at 90-93 for duration of session. Respiratory rate ranging on the high side (avg 25). Pt with hoarse, congested vocal quality. Minimal voicing despite prompting, with low vocal intensity. Overall delayed responses and intermittent command following noted, aided by provision of context when completing PO trials. Pt seen with select trials of ice without overt indication of aspiration; however, concern for silent aspiration in the setting of prolonged intubation. Pt with immediate, weak cough with tsp sips of thin liquid. Increased WOB noted across PO trials. Oral phase mildly prolonged, with reduced oral manipulation of bolus. Further trials discontinued given increased WOB and  weak cough/concern for aspiration. Recommend continued NPO status with allowance of select ice chips following oral care when pt is alert to facilitate pharyngeal conditioning and comfort. SLP will continue to follow for dysphagia intervention. RN and MD notified. SLP Visit Diagnosis: Dysphagia, oropharyngeal phase (R13.12)    Aspiration Risk  Severe aspiration risk    Diet Recommendation    NPO  Medication Administration: Via alternative means    Other  Recommendations Oral Care Recommendations: Oral care QID;Oral care prior to ice chip/H20    Recommendations for follow up therapy are one component of a multi-disciplinary discharge planning process, led by the attending physician.  Recommendations may be updated based on patient status, additional functional criteria and insurance authorization.  Follow up Recommendations Follow physician's recommendations for discharge plan and follow up therapies      Assistance Recommended at Discharge    Functional Status Assessment Patient has had a recent decline in their functional status and demonstrates the ability to make significant improvements in function in a reasonable and predictable amount of time.  Frequency and Duration min 2x/week  2 weeks       Prognosis Prognosis for improved oropharyngeal function: Fair Barriers to Reach Goals: Severity of deficits      Swallow Study   General Date of Onset: 05/05/23 HPI: Sarah Norton is a 68 year old female with a past medical history significant for COPD on home supplemental oxygen and diabetes mellitus who presented to [facility] ED on 04/24/2023 due to unresponsiveness. Upon EMS arrival she was noted to be hypoxic with O2 saturations in the 40s for which she was placed on CPAP with improvement in sats to the 80s. Upon arrival to the ED she remained somnolent but slightly arousable, and given her hypoxia, she was transitioned to BiPAP. Pt admitted with Acute Metabolic Encephalopathy and Acute Hypoxic and Hypercapnic Respiratory Failure in the setting of Acute COPD Exacerbation due to Influenza A infection and questionable superimposed bacterial pneumonia, failed trial of BiPAP requiring intubation and mechanical ventilation. Course complicated by AKI requiring initiation of CRRT. Pt intubated 2/13-2/23. MRI Brain on 2/20 negative for acute intracranial process CXR 2/22: Minimal bibasilar  subsegmental atelectasis. Pt is currently NPO. Type of Study: Bedside Swallow Evaluation Previous Swallow Assessment: none Diet Prior to this Study: NPO Temperature Spikes Noted: No (98.4; WBC 17.4) Respiratory Status: Increased respiratory rate;Nasal cannula (high flow nasal canula) History of Recent Intubation: Yes Total duration of intubation (days): 10 days Date extubated: 05/04/23 Behavior/Cognition: Lethargic/Drowsy;Requires cueing;Confused Oral Cavity Assessment: Within Functional Limits Oral Care Completed by SLP: Yes Oral Cavity - Dentition: Missing dentition;Other (Comment) (missing top dentition and some lower dentition) Vision:  (not assessed) Self-Feeding Abilities: Total assist Patient Positioning: Upright in bed Baseline Vocal Quality: Hoarse;Low vocal intensity Volitional Cough: Cognitively unable to elicit Volitional Swallow: Unable to elicit    Oral/Motor/Sensory Function Overall Oral Motor/Sensory Function: Within functional limits (though limited assessment d/t cognition)   Ice Chips Ice chips: Impaired Presentation: Spoon Oral Phase Impairments: Poor awareness of bolus Oral Phase Functional Implications: Prolonged oral transit Pharyngeal Phase Impairments:  (none)   Thin Liquid Thin Liquid: Impaired Presentation: Spoon Oral Phase Impairments: Poor awareness of bolus Oral Phase Functional Implications:  (none) Pharyngeal  Phase Impairments: Multiple swallows;Cough - Immediate    Nectar Thick Nectar Thick Liquid: Not tested   Honey Thick Honey Thick Liquid: Not tested   Puree Puree: Not tested   Solid     Solid: Not tested     [place] Rayah Fines Clapp, MS,  CCC-SLP Warehouse manager Rehab Services; [facility] [phone] (ascom)   [place] J Clapp 05/05/2023,10:35 AM

## 2023-05-05 NOTE — Consult Note (Signed)
 PHARMACY CONSULT NOTE - ELECTROLYTES  Pharmacy Consult for Electrolyte Monitoring and Replacement   Recent Labs:  Height: 5' 7.99" (172.7 cm) Weight: 112.7 kg (248 lb 7.3 oz) IBW/kg (Calculated) : 63.88 Estimated Creatinine Clearance: 87.4 mL/min (by C-G formula based on SCr of 0.8 mg/dL). Potassium (mmol/L)  Date Value  05/05/2023 3.7  11/16/2013 3.5   Magnesium (mg/dL)  Date Value  11/91/4782 2.0  11/16/2013 1.0 (L)   Calcium (mg/dL)  Date Value  95/62/1308 10.1   Calcium, Total (mg/dL)  Date Value  65/78/4696 9.1   Albumin (g/dL)  Date Value  29/52/8413 3.3 (L)  11/16/2013 3.8   Phosphorus (mg/dL)  Date Value  24/40/1027 3.3   Sodium (mmol/L)  Date Value  05/05/2023 146 (H)  11/16/2013 133 (L)    Assessment  Sarah Norton is a 68 y.o. female presenting with respiratory failure and unresponsive. PMH significant for DM and COPD. Patient was started on CRRT 2/15 then taken off evening of 2/18. They continue to have good urine output. Nephrology has signed off. Pharmacy has been consulted to monitor and replace electrolytes while they're in the ICU.   Diet: NPO, Has OG Nutrition: Juven powder: 1 packet BID Pertinent medications: Lasix 40 mg IV BID  Goal of Therapy: Electrolytes WNL   Plan:  --No replacement currently indicated --Follow-up electrolytes with AM labs tomorrow  Thank you for allowing pharmacy to be a part of this patient's care.  Bettey Costa 05/05/2023 7:31 AM

## 2023-05-06 ENCOUNTER — Inpatient Hospital Stay: Payer: 59

## 2023-05-06 DIAGNOSIS — J9602 Acute respiratory failure with hypercapnia: Secondary | ICD-10-CM | POA: Diagnosis not present

## 2023-05-06 DIAGNOSIS — J9601 Acute respiratory failure with hypoxia: Secondary | ICD-10-CM | POA: Diagnosis not present

## 2023-05-06 LAB — CBC
HCT: 34.8 % — ABNORMAL LOW (ref 36.0–46.0)
Hemoglobin: 11.5 g/dL — ABNORMAL LOW (ref 12.0–15.0)
MCH: 30.7 pg (ref 26.0–34.0)
MCHC: 33 g/dL (ref 30.0–36.0)
MCV: 93 fL (ref 80.0–100.0)
Platelets: 590 10*3/uL — ABNORMAL HIGH (ref 150–400)
RBC: 3.74 MIL/uL — ABNORMAL LOW (ref 3.87–5.11)
RDW: 17.1 % — ABNORMAL HIGH (ref 11.5–15.5)
WBC: 28.8 10*3/uL — ABNORMAL HIGH (ref 4.0–10.5)
nRBC: 0 % (ref 0.0–0.2)

## 2023-05-06 LAB — RENAL FUNCTION PANEL
Albumin: 3.4 g/dL — ABNORMAL LOW (ref 3.5–5.0)
Anion gap: 12 (ref 5–15)
BUN: 60 mg/dL — ABNORMAL HIGH (ref 8–23)
CO2: 32 mmol/L (ref 22–32)
Calcium: 9.7 mg/dL (ref 8.9–10.3)
Chloride: 100 mmol/L (ref 98–111)
Creatinine, Ser: 1.09 mg/dL — ABNORMAL HIGH (ref 0.44–1.00)
GFR, Estimated: 55 mL/min — ABNORMAL LOW (ref >60)
Glucose, Bld: 130 mg/dL — ABNORMAL HIGH (ref 70–99)
Phosphorus: 5.1 mg/dL — ABNORMAL HIGH (ref 2.5–4.6)
Potassium: 3.8 mmol/L (ref 3.5–5.1)
Sodium: 144 mmol/L (ref 135–145)

## 2023-05-06 LAB — GLUCOSE, CAPILLARY
Glucose-Capillary: 110 mg/dL — ABNORMAL HIGH (ref 70–99)
Glucose-Capillary: 116 mg/dL — ABNORMAL HIGH (ref 70–99)
Glucose-Capillary: 147 mg/dL — ABNORMAL HIGH (ref 70–99)
Glucose-Capillary: 151 mg/dL — ABNORMAL HIGH (ref 70–99)
Glucose-Capillary: 159 mg/dL — ABNORMAL HIGH (ref 70–99)
Glucose-Capillary: 164 mg/dL — ABNORMAL HIGH (ref 70–99)

## 2023-05-06 LAB — BLOOD GAS, ARTERIAL
Acid-Base Excess: 12 mmol/L — ABNORMAL HIGH (ref 0.0–2.0)
Bicarbonate: 39.6 mmol/L — ABNORMAL HIGH (ref 20.0–28.0)
FIO2: 0.8 %
MECHVT: 400 mL
O2 Saturation: 97.5 %
PEEP: 8 cmH2O
Patient temperature: 37
RATE: 16 {breaths}/min
pCO2 arterial: 64 mmHg — ABNORMAL HIGH (ref 32–48)
pH, Arterial: 7.4 (ref 7.35–7.45)
pO2, Arterial: 86 mmHg (ref 83–108)

## 2023-05-06 LAB — HEPARIN LEVEL (UNFRACTIONATED): Heparin Unfractionated: 0.44 [IU]/mL (ref 0.30–0.70)

## 2023-05-06 LAB — TROPONIN I (HIGH SENSITIVITY): Troponin I (High Sensitivity): 21 ng/L — ABNORMAL HIGH (ref <18)

## 2023-05-06 LAB — MAGNESIUM: Magnesium: 2.2 mg/dL (ref 1.7–2.4)

## 2023-05-06 MED ORDER — POLYETHYLENE GLYCOL 3350 17 G PO PACK
17.0000 g | PACK | Freq: Every day | ORAL | Status: DC
  Administered 2023-05-06 – 2023-05-08 (×3): 17 g
  Filled 2023-05-06 (×3): qty 1

## 2023-05-06 MED ORDER — ADULT MULTIVITAMIN W/MINERALS CH: 1.0000 | ORAL_TABLET | Freq: Every day | ORAL | Status: DC

## 2023-05-06 MED ORDER — FENTANYL CITRATE PF 50 MCG/ML IJ SOSY
25.0000 ug | PREFILLED_SYRINGE | INTRAMUSCULAR | Status: DC | PRN
  Administered 2023-05-08: 50 ug via INTRAVENOUS
  Filled 2023-05-06: qty 1

## 2023-05-06 MED ORDER — SENNA 8.6 MG PO TABS
2.0000 | ORAL_TABLET | Freq: Every day | ORAL | Status: DC
  Administered 2023-05-07 – 2023-05-08 (×2): 17.2 mg
  Filled 2023-05-06 (×2): qty 2

## 2023-05-06 MED ORDER — FREE WATER
100.0000 mL | Status: DC
  Administered 2023-05-06 – 2023-05-09 (×16): 100 mL

## 2023-05-06 MED ORDER — DOCUSATE SODIUM 50 MG/5ML PO LIQD
100.0000 mg | Freq: Two times a day (BID) | ORAL | Status: DC
  Administered 2023-05-06 – 2023-05-08 (×6): 100 mg
  Filled 2023-05-06 (×6): qty 10

## 2023-05-06 MED ORDER — MIDAZOLAM HCL 2 MG/2ML IJ SOLN
2.0000 mg | Freq: Once | INTRAMUSCULAR | Status: AC
  Administered 2023-05-06: 2 mg via INTRAVENOUS
  Filled 2023-05-06: qty 2

## 2023-05-06 MED ORDER — DEXAMETHASONE SODIUM PHOSPHATE 10 MG/ML IJ SOLN
8.0000 mg | Freq: Every day | INTRAMUSCULAR | Status: DC
  Administered 2023-05-06 – 2023-05-08 (×3): 8 mg via INTRAVENOUS
  Filled 2023-05-06 (×3): qty 0.8

## 2023-05-06 MED ORDER — NOREPINEPHRINE 4 MG/250ML-% IV SOLN
INTRAVENOUS | Status: AC
Start: 2023-05-06 — End: 2023-05-07
  Administered 2023-05-06: 4 mg
  Filled 2023-05-06: qty 250

## 2023-05-06 MED ORDER — POTASSIUM CHLORIDE CRYS ER 20 MEQ PO TBCR: 20.0000 meq | EXTENDED_RELEASE_TABLET | Freq: Once | ORAL | Status: DC

## 2023-05-06 MED ORDER — POTASSIUM CHLORIDE 10 MEQ/100ML IV SOLN
10.0000 meq | INTRAVENOUS | Status: AC
  Administered 2023-05-06 (×2): 10 meq via INTRAVENOUS
  Filled 2023-05-06 (×2): qty 100

## 2023-05-06 MED ORDER — PROPOFOL 1000 MG/100ML IV EMUL
0.0000 ug/kg/min | INTRAVENOUS | Status: DC
  Administered 2023-05-06: 40 ug/kg/min via INTRAVENOUS
  Administered 2023-05-06: 30 ug/kg/min via INTRAVENOUS
  Administered 2023-05-06: 40 ug/kg/min via INTRAVENOUS
  Administered 2023-05-07 (×2): 30 ug/kg/min via INTRAVENOUS
  Administered 2023-05-07 (×2): 40 ug/kg/min via INTRAVENOUS
  Administered 2023-05-07: 30 ug/kg/min via INTRAVENOUS
  Administered 2023-05-07: 50 ug/kg/min via INTRAVENOUS
  Administered 2023-05-08 (×2): 30 ug/kg/min via INTRAVENOUS
  Administered 2023-05-08: 40 ug/kg/min via INTRAVENOUS
  Administered 2023-05-08: 30 ug/kg/min via INTRAVENOUS
  Administered 2023-05-08: 40 ug/kg/min via INTRAVENOUS
  Administered 2023-05-09: 30 ug/kg/min via INTRAVENOUS
  Administered 2023-05-09: 40 ug/kg/min via INTRAVENOUS
  Administered 2023-05-09: 30 ug/kg/min via INTRAVENOUS
  Filled 2023-05-06 (×17): qty 100

## 2023-05-06 MED ORDER — LACTULOSE 10 GM/15ML PO SOLN: 10.0000 g | Freq: Every day | ORAL | Status: DC | PRN

## 2023-05-06 MED ORDER — ORAL CARE MOUTH RINSE: 15.0000 mL | OROMUCOSAL | Status: DC | PRN

## 2023-05-06 MED ORDER — PROSOURCE TF20 ENFIT COMPATIBL EN LIQD
60.0000 mL | Freq: Every day | ENTERAL | Status: DC
  Administered 2023-05-07 – 2023-05-08 (×2): 60 mL
  Filled 2023-05-06 (×3): qty 60

## 2023-05-06 MED ORDER — FAMOTIDINE 20 MG PO TABS
20.0000 mg | ORAL_TABLET | Freq: Two times a day (BID) | ORAL | Status: DC
  Administered 2023-05-06 – 2023-05-08 (×5): 20 mg
  Filled 2023-05-06 (×7): qty 1

## 2023-05-06 MED ORDER — FENTANYL CITRATE PF 50 MCG/ML IJ SOSY
25.0000 ug | PREFILLED_SYRINGE | INTRAMUSCULAR | Status: AC | PRN
  Administered 2023-05-07 – 2023-05-08 (×3): 25 ug via INTRAVENOUS
  Filled 2023-05-06 (×2): qty 1

## 2023-05-06 MED ORDER — ORAL CARE MOUTH RINSE
15.0000 mL | OROMUCOSAL | Status: DC
  Administered 2023-05-06 – 2023-05-07 (×15): 15 mL via OROMUCOSAL

## 2023-05-06 MED ORDER — JUVEN PO PACK
1.0000 | PACK | Freq: Two times a day (BID) | ORAL | Status: DC
  Administered 2023-05-07 – 2023-05-08 (×4): 1

## 2023-05-06 MED ORDER — ETOMIDATE 2 MG/ML IV SOLN
20.0000 mg | Freq: Once | INTRAVENOUS | Status: AC
  Administered 2023-05-06: 20 mg via INTRAVENOUS
  Filled 2023-05-06: qty 10

## 2023-05-06 MED ORDER — LEVOTHYROXINE SODIUM 50 MCG PO TABS
175.0000 ug | ORAL_TABLET | Freq: Every day | ORAL | Status: DC
  Administered 2023-05-07 – 2023-05-09 (×3): 175 ug
  Filled 2023-05-06 (×3): qty 2

## 2023-05-06 MED ORDER — PROPOFOL 1000 MG/100ML IV EMUL
INTRAVENOUS | Status: AC
  Filled 2023-05-06: qty 100

## 2023-05-06 MED ORDER — ACETAMINOPHEN 325 MG PO TABS
650.0000 mg | ORAL_TABLET | ORAL | Status: DC | PRN
Start: 2023-05-06 — End: 2023-05-09

## 2023-05-06 MED ORDER — VITAL AF 1.2 CAL PO LIQD
1000.0000 mL | ORAL | Status: DC
  Administered 2023-05-06 – 2023-05-09 (×3): 1000 mL

## 2023-05-06 MED ORDER — AZITHROMYCIN 250 MG PO TABS
250.0000 mg | ORAL_TABLET | Freq: Every day | ORAL | Status: DC
  Administered 2023-05-06: 250 mg
  Filled 2023-05-06: qty 1

## 2023-05-06 MED ORDER — ROCURONIUM BROMIDE 10 MG/ML (PF) SYRINGE
100.0000 mg | PREFILLED_SYRINGE | Freq: Once | INTRAVENOUS | Status: AC
  Administered 2023-05-06: 100 mg via INTRAVENOUS
  Filled 2023-05-06: qty 10

## 2023-05-06 NOTE — Progress Notes (Signed)
 PT Cancellation Note  Patient Details Name: Sarah Norton MRN: [id number] DOB: 09-12-1955   Cancelled Treatment:    Reason Eval/Treat Not Completed: Other (comment). 2nd attempt- Per discussion with RN, pt re-intubated this date and sedated at this time. Will follow along for improvement for additional day and monitor readiness for PT evaluation.   Leyna Vanderkolk 05/06/2023, 1:32 PM Elizabeth Palau, PT, DPT, GCS [phone]

## 2023-05-06 NOTE — Progress Notes (Signed)
 SLP Cancellation Note  Patient Details Name: Sarah Norton MRN: [id number] DOB: 1955/07/15   Cancelled treatment:       Reason Eval/Treat Not Completed: Medical issues which prohibited therapy;Patient not medically ready (chart reviewed)  Per chart review, pt had a decline in status this morning w/ increased labored breathing, then on BIPAP w/ tachyarrythmia. MD ordered for re-intubation.   ST services will s/o from Dysphagia tx; MD to reconsult when needs indicate.     Jerilynn Som, MS, CCC-SLP Speech Language Pathologist Rehab Services; [facility] [phone] (ascom) Sarah Norton 05/06/2023, 4:48 PM

## 2023-05-06 NOTE — Progress Notes (Signed)
 NAME:  Sarah Norton, MRN:  [id number], DOB:  [birth year], LOS: 12 ADMISSION DATE:  04/24/2023, CONSULTATION DATE:  04/24/23 REFERRING MD:  Dr. Rosalia Hammers, CHIEF COMPLAINT:  Acute Respiratory Distress, hypoxia, AMS   Brief Pt Description / Synopsis:  68 y.o female admitted with Acute Metabolic Encephalopathy and Acute Hypoxic and Hypercapnic Respiratory Failure in the setting of Acute COPD Exacerbation due to Influenza A infection and questionable superimposed bacterial pneumonia, failed trial of BiPAP requiring intubation and mechanical ventilation.  Course complicated by AKI requiring initiation of CRRT.  History of Present Illness:  Sarah Norton is a 68 year old female with a past medical history significant for COPD on home supplemental oxygen and diabetes mellitus who presented to [facility] ED on 04/24/2023 due to unresponsiveness.  Upon EMS arrival she was noted to be hypoxic with O2 saturations in the 40s for which she was placed on CPAP with improvement in sats to the 80s.  Upon arrival to the ED she remained somnolent but slightly arousable, and given her hypoxia, she was transitioned to BiPAP  ED Course: Initial Vital Signs: Temperature 96.5 F axillary, pulse 87, respiratory rate 33, blood pressure 136/97, SpO2 78% Significant Labs: Sodium 134, glucose 313, BUN 59, creatinine 1.27, BNP 436, high-sensitivity troponin 139, lactic acid 1.7, WBC 33.4, D-dimer 2.13 VBG: pH 7.19/pCO2 82/pO2 39/bicarb 31.3 Positive for influenza A Urinalysis negative for UTI Imaging Chest X-ray>>IMPRESSION: *Bibasilar heterogeneous opacities, favored to represent multilobar pneumonia. Follow-up to clearing is recommended. CTa Chest>> pending currently Medications Administered: 1 L normal saline bolus, 125 mg Solu-Medrol, IV azithromycin and ceftriaxone, Levophed drip initiated Despite BiPAP patient's ABG worsened with worsening mental status.  Therefore ED provider elected to intubate.  PCCM is asked to admit for further  workup and treatment. Please see "significant hospital events" section below for full detailed hospital course.   Pertinent  Medical History   Past Medical History:  Diagnosis Date   Arthritis    Asthma    Cataract of both eyes    Complication of anesthesia    woke up during surgery x1   COPD (chronic obstructive pulmonary disease) (HCC)    Coronary artery disease    DDD (degenerative disc disease), lumbar    Diabetes mellitus without complication (HCC)    Dyspnea    DUE TO COPD   Hernia of abdominal wall    History of hiatal hernia    small   Hyperlipidemia    Hypertension    Hypothyroidism    hashimotos throiditis  -thyroidectomy  2000   Occasional tremors    Oxygen desaturation    NOW PT ON 3-4 LITERS [place] DAILY    Micro Data:  2/13: COVID/FLU/RSV PCR>> + Influenza A 2/13: Blood culture x2>> no growth 2/13: Strep pneumo urinary antigen>> negative 2/13: Legionella urinary antigen>> negative 2/13: HIV screen>>negative 2/13: MRSA PCR>> negative 2/15: Tracheal aspirate>> no growth  Antimicrobials:   Anti-infectives (From admission, onward)    Start     Dose/Rate Route Frequency Ordered Stop   04/30/23 2200  ceFEPIme (MAXIPIME) 1 g in sodium chloride 0.9 % 100 mL IVPB  Status:  Discontinued        1 g 200 mL/hr over 30 Minutes Intravenous Every 24 hours 04/29/23 1842 04/30/23 1006   04/29/23 1145  linezolid (ZYVOX) tablet 600 mg  Status:  Discontinued        600 mg Per Tube Every 12 hours 04/29/23 1052 04/30/23 1006   04/28/23 1015  vancomycin (VANCOREADY) IVPB 1250 mg/250 mL  1,250 mg 166.7 mL/hr over 90 Minutes Intravenous  Once 04/28/23 0921 04/28/23 1209   04/28/23 1000  oseltamivir (TAMIFLU) 6 MG/ML suspension 30 mg  Status:  Discontinued        30 mg Per Tube Daily 04/28/23 0737 04/28/23 0747   04/28/23 1000  oseltamivir (TAMIFLU) 6 MG/ML suspension 75 mg        75 mg Per Tube Daily 04/28/23 0747 04/29/23 1155   04/27/23 1000  oseltamivir (TAMIFLU) 6  MG/ML suspension 75 mg  Status:  Discontinued        75 mg Per Tube Daily 04/26/23 1834 04/28/23 0737   04/27/23 0815  vancomycin (VANCOCIN) IVPB 1000 mg/200 mL premix        1,000 mg 200 mL/hr over 60 Minutes Intravenous  Once 04/27/23 0724 04/27/23 0921   04/26/23 2200  ceFEPIme (MAXIPIME) 2 g in sodium chloride 0.9 % 100 mL IVPB  Status:  Discontinued        2 g 200 mL/hr over 30 Minutes Intravenous Every 12 hours 04/26/23 1834 04/29/23 1842   04/26/23 1000  oseltamivir (TAMIFLU) 6 MG/ML suspension 30 mg  Status:  Discontinued        30 mg Per Tube Daily 04/25/23 1017 04/26/23 1834   04/25/23 2000  vancomycin (VANCOREADY) IVPB 1250 mg/250 mL  Status:  Discontinued        1,250 mg 166.7 mL/hr over 90 Minutes Intravenous Every 24 hours 04/24/23 2106 04/25/23 1151   04/25/23 1300  ceFEPIme (MAXIPIME) 2 g in sodium chloride 0.9 % 100 mL IVPB  Status:  Discontinued        2 g 200 mL/hr over 30 Minutes Intravenous Every 24 hours 04/25/23 1157 04/26/23 1834   04/25/23 1200  azithromycin (ZITHROMAX) 500 mg in sodium chloride 0.9 % 250 mL IVPB        500 mg 250 mL/hr over 60 Minutes Intravenous Every 24 hours 04/24/23 1557 04/28/23 1312   04/25/23 1150  vancomycin variable dose per unstable renal function (pharmacist dosing)  Status:  Discontinued         Does not apply See admin instructions 04/25/23 1151 04/29/23 1052   04/25/23 1100  cefTRIAXone (ROCEPHIN) 2 g in sodium chloride 0.9 % 100 mL IVPB  Status:  Discontinued        2 g 200 mL/hr over 30 Minutes Intravenous Every 24 hours 04/24/23 1557 04/25/23 1021   04/24/23 2200  oseltamivir (TAMIFLU) 6 MG/ML suspension 30 mg  Status:  Discontinued        30 mg Per Tube 2 times daily 04/24/23 1559 04/25/23 1017   04/24/23 2045  vancomycin (VANCOREADY) IVPB 2000 mg/400 mL        2,000 mg 200 mL/hr over 120 Minutes Intravenous  Once 04/24/23 2034 04/25/23 0045   04/24/23 1245  cefTRIAXone (ROCEPHIN) 2 g in sodium chloride 0.9 % 100 mL IVPB         2 g 200 mL/hr over 30 Minutes Intravenous Once 04/24/23 1244 04/24/23 1407   04/24/23 1245  azithromycin (ZITHROMAX) 500 mg in sodium chloride 0.9 % 250 mL IVPB        500 mg 250 mL/hr over 60 Minutes Intravenous  Once 04/24/23 1244 04/24/23 1513       Significant Hospital Events: Including procedures, antibiotic start and stop dates in addition to other pertinent events   2/13: Presented to ED with AMS, acute respiratory distress and hypoxia.  Initially trialed on BiPAP but with worsening ABG and  mental status.  ED provider intubated.  PCCM asked to admit. 04/25/2023 - Patient with improving respiratory status however with worsening wbc and procalcitonin.  04/26/2023 - HD cath placed and started on CRRT. 2/17 remains on vent, on CRRT 02/18: CRRT discontinued  02/19: On minimal vent settings.  Plan for WUA/SBT as tolerated utilizing Precedex.  With SBT, increased RR/WOB/accessory muscle use.  Adequate urine output and electrolytes acceptable, no plan for HD today. 02/20: Remains on minimal vent support, SBT as tolerated. On WUA with Precedex, she opens eyes but not following commands,  MRI Brain negative for acute intracranial abnormality. Faild SBT due to tachypnea (RR 40's) and increased WOB 02/21: No significant events noted overnight.  Afebrile, hemodynamically stable, not requiring vasopressors. Leukocytosis improving. On minimal vent support, plan for WUA and SBT as tolerated ~ failed SBT due to tachypnea (RR 40's) and increased WOB.  Not following commands on WUA, will obtain EEG. 05/03/2023: Failed SBT due to altered mental status.   05/04/23: Tolerated SBT and was extubated to HF [place] 05/05/23- patient passed SLP for ice chips only thus far. Remains on HHFNC weaned to 40/40, drowsy during interview.  05/05/24- patient with GCS7 on exam this morning with labored breathing on BIPAP and tachyarrythmia. Will proceed with intubation emergently.  Family contacted husband is POA   Objective    Blood pressure (!) 145/85, pulse 88, temperature 97.9 F (36.6 C), temperature source Axillary, resp. rate (!) 26, height 5' 7.99" (1.727 m), weight 106 kg, SpO2 96%.    FiO2 (%):  [60 %-100 %] 70 %   Intake/Output Summary (Last 24 hours) at 05/06/2023 0934 Last data filed at 05/06/2023 0800 Gross per 24 hour  Intake 1047.02 ml  Output 2590 ml  Net -1542.98 ml   Filed Weights   05/03/23 0458 05/04/23 0500 05/05/23 0704  Weight: 114.1 kg 112.7 kg 106 kg    Examination: General: Patient is unresponsive not commands in distress HENT: Atraumatic, normocephalic, PERRLA, neck supple, difficult to assess JVD due to body habitus and bilateral internal jugular central line/HD catheters.   Lungs: Bilateral breath sounds with decreased air entry Cardiovascular: Irregularly irregular rhythm, rate controlled, no murmurs, rubs, gallops Abdomen: Obese, soft, distended, hypoactive bowel sounds, no pain with palpation Extremities: Normal bulk and tone, no deformities, 1+edema to bilateral lower extremities, venous stasis discoloration bilateral lower extremities Neuro: Awake,alert no FND grossly GU: Foley catheter removed,   Assessment & Plan:   #Acute Hypoxic & Hypercapnic Respiratory Failure in the setting of AECOPD, Influenza infection, and questionable superimposed bacterial pneumonia CTa Chest on 2/13: negative for PE, AAA or dissection. Concerning for interstitial and alveolar edema vs pneumonitis, and bibasilar consolidation (R >L). -Successfully extubated. - Continue aggressive pulmonary toileting -Follow intermittent Chest X-ray & ABG as needed -Bronchodilators as needed -Monitor fever curve - Antibiotics and Tamiflu completed -Patient is at increased risk for reintubation.  Please monitor respiratory status closely.           -intubation 05/06/23 due to GCS7 failure of BIPAP  #Shock: Suspect Septic ~resolved  #Elevated Troponin in setting of demand ischemia  #Atrial Fibrillation  with RVR ~ RATE CONTROLLED Echocardiogram 04/25/23: LVEF 60-65%, mild LVH, normal diastolic parameters, RV systolic function is normal, moderately elevated pulmonary artery systolic pressure, mild to moderate AS -Continuous cardiac monitoring -Maintain MAP >65 -IV fluids -Off pressors -Continue to monitor blood pressure closely -Diuresis as BP and renal function permits  -Continue Heparin gtt: Dosing per Pharmacy -Repeat ABG at 2000 -Precedex for  anxiety and respiratory distress  #Meets SIRS Criteria (RR 34, WBC 33.4) #Severe Sepsis in setting of Influenza A infection and questionable superimposed bacterial pneumonia ~ TREATED -PRESENT ON ADMISSION  -Trend WBC's -Follow cultures as above -Complete course of Cefepime and Linezolid on 2/19 -Continue to monitor fever curve and reculture as needed  #Acute Kidney Injury on CKD Stage IIIa ~ IMPROVING #Mild Hyponatremia ~ RESOLVED -Monitor I&O's / urinary output -Creatinine trending down but BUN is trending up; trend both indices and reconsult nephrology as needed --Avoid nephrotoxic agents  -Replace electrolytes as indicated ~ Pharmacy following for assistance with electrolyte replacement -Follow-up with nephrology regarding need for further hemodialysis  #Diabetes Mellitus -CBG's q4h; Target range of 140 to 180 -SSI -Follow ICU Hypo/Hyperglycemia protocol  #Acute Metabolic Encephalopathy, suspect CO2 Narcosis #Sedation needs in setting of mechanical ventilation -CT Head on 2/13 negative for acute intracranial process -MRI Brain on 2/20 negative for acute intracranial process -Treatment of metabolic derangements, sepsis, and hypercapnia as outlined above -Mental status is improving -Avoid sedation -Avoid constipation and give lactulose as needed for no bowel movement in more than 2 days -Consider EEG if mental status fails to improve with improved kidney function and and resolution of shock  Best Practice (right click and "Reselect  all SmartList Selections" daily)   Diet/type: N.p.o.; awaiting speech and swallow eval DVT prophylaxis: Heparin gtt GI prophylaxis: PPI Lines: HD cathete I think the patient did not get pulled by procedure r, still needed Foley:  Yes, and it is still needed Code Status:  full code Last date of multidisciplinary goals of care discussion [2/23] at bedside with patient's significant other state  Labs   CBC: Recent Labs  Lab 05/02/23 0444 05/03/23 0253 05/04/23 0424 05/05/23 0315 05/06/23 0638  WBC 26.1* 24.5* 16.9* 17.4* 28.8*  HGB 9.0* 9.0* 8.9* 10.5* 11.5*  HCT 26.8* 26.4* 26.7* 31.5* 34.8*  MCV 90.2 88.9 91.4 90.5 93.0  PLT 338 406* 428* 516* 590*    Basic Metabolic Panel: Recent Labs  Lab 05/02/23 0444 05/03/23 0253 05/04/23 0424 05/04/23 1134 05/05/23 0315 05/06/23 0638  NA 136 138 139 145 146* 144  K 3.7 3.8 3.5 4.2 3.7 3.8  CL 100 101 102 100 101 100  CO2 27 26 29 31  33* 32  GLUCOSE 167* 205* 239* 204* 103* 130*  BUN 89* 75* 77* 76* 61* 60*  CREATININE 1.27* 1.08* 1.09* 1.02* 0.80 1.09*  CALCIUM 9.5 9.5 9.6 10.4* 10.1 9.7  MG 1.7 1.9  --  1.8 2.0 2.2  PHOS 3.6 3.4 2.4* 3.2 3.3 5.1*   GFR: Estimated Creatinine Clearance: 62.1 mL/min (A) (by C-G formula based on SCr of 1.09 mg/dL (H)). Recent Labs  Lab 05/03/23 0253 05/04/23 0424 05/05/23 0315 05/06/23 0638  WBC 24.5* 16.9* 17.4* 28.8*    Liver Function Tests: Recent Labs  Lab 05/03/23 0253 05/04/23 0424 05/04/23 1134 05/05/23 0315 05/06/23 0638  AST  --   --  50*  --   --   ALT  --   --  92*  --   --   ALKPHOS  --   --  155*  --   --   BILITOT  --   --  1.0  --   --   PROT  --   --  7.6  --   --   ALBUMIN 2.5* 2.6* 3.5 3.3* 3.4*   No results for input(s): "LIPASE", "AMYLASE" in the last 168 hours. No results for input(s): "AMMONIA" in the  last 168 hours.  ABG    Component Value Date/Time   PHART 7.54 (H) 05/04/2023 2012   PCO2ART 46 05/04/2023 2012   PO2ART 82 (L) 05/04/2023 2012    HCO3 39.3 (H) 05/04/2023 2012   ACIDBASEDEF 3.2 (H) 04/27/2023 1551   O2SAT 99 05/04/2023 2012     Coagulation Profile: No results for input(s): "INR", "PROTIME" in the last 168 hours.   Cardiac Enzymes: No results for input(s): "CKTOTAL", "CKMB", "CKMBINDEX", "TROPONINI" in the last 168 hours.  HbA1C: Hemoglobin A1C  Date/Time Value Ref Range Status  11/16/2013 04:11 PM 6.5 (H) 4.2 - 6.3 % Final    Comment:    The American Diabetes Association recommends that a primary goal of therapy should be <7% and that physicians should reevaluate the treatment regimen in patients with HbA1c values consistently >8%.    Hgb A1c MFr Bld  Date/Time Value Ref Range Status  04/25/2023 04:11 AM 6.5 (H) 4.8 - 5.6 % Final    Comment:    (NOTE) Pre diabetes:          5.7%-6.4%  Diabetes:              >6.4%  Glycemic control for   <7.0% adults with diabetes   05/04/2020 04:33 AM 8.0 (H) 4.8 - 5.6 % Final    Comment:    (NOTE) Pre diabetes:          5.7%-6.4%  Diabetes:              >6.4%  Glycemic control for   <7.0% adults with diabetes     CBG: Recent Labs  Lab 05/05/23 2000 05/05/23 2139 05/05/23 2322 05/06/23 0358 05/06/23 0720  GLUCAP 94 84 82 110* 116*    Review of Systems:   Unable to assess due to AMS/intubation/sedation   Past Medical History:  She,  has a past medical history of Arthritis, Asthma, Cataract of both eyes, Complication of anesthesia, COPD (chronic obstructive pulmonary disease) (HCC), Coronary artery disease, DDD (degenerative disc disease), lumbar, Diabetes mellitus without complication (HCC), Dyspnea, Hernia of abdominal wall, History of hiatal hernia, Hyperlipidemia, Hypertension, Hypothyroidism, Occasional tremors, and Oxygen desaturation.   Surgical History:   Past Surgical History:  Procedure Laterality Date   ABDOMINAL HYSTERECTOMY     CHOLECYSTECTOMY     COLONOSCOPY WITH PROPOFOL N/A 09/08/2014   Procedure: COLONOSCOPY WITH PROPOFOL;   Surgeon: Midge Minium, MD;  Location: [facility] ENDOSCOPY;  Service: Endoscopy;  Laterality: N/A;   GANGLION CYST EXCISION Right 08/15/2020   Procedure: Right index finger cyst removal;  Surgeon: Kennedy Bucker, MD;  Location: [facility] ORS;  Service: Orthopedics;  Laterality: Right;   HIATAL HERNIA REPAIR  2000   OTHER SURGICAL HISTORY     vocal cord surgery due to injury during thyroid surgery   parathyroidectomy  2000   THYROIDECTOMY     TONSILLECTOMY     TUMOR REMOVAL Right Leg     Social History:   reports that she quit smoking about 7 years ago. Her smoking use included cigarettes. She started smoking about 55 years ago. She has a 48 pack-year smoking history. She has never used smokeless tobacco. She reports that she does not drink alcohol and does not use drugs.   Family History:  Her family history includes Breast cancer in her sister; Kidney disease in her mother.   Allergies No Known Allergies   Home Medications  Prior to Admission medications   Medication Sig Start Date End Date Taking? Authorizing Provider  MOUNJARO 10 MG/0.5ML Pen Inject 10 mg into the skin once a week. 03/27/23  Yes [provider]  albuterol (VENTOLIN HFA) 108 (90 Base) MCG/ACT inhaler Inhale 2 puffs into the lungs every 4 (four) hours as needed for wheezing or shortness of breath.    [provider]  allopurinol (ZYLOPRIM) 100 MG tablet Take 100 mg by mouth every morning.    [provider]  amLODipine (NORVASC) 5 MG tablet Take 5 mg by mouth every morning. 07/23/20   [provider]  atorvastatin (LIPITOR) 40 MG tablet Take 40 mg by mouth every morning.    [provider]  benazepril (LOTENSIN) 40 MG tablet Take 40 mg by mouth every morning.    [provider]  chlorhexidine (PERIDEX) 0.12 % solution Use as directed 15 mLs in the mouth or throat as needed.    [provider]  ELIQUIS 5 MG TABS tablet Take 5 mg by mouth 2 (two) times daily.    [provider]  fluticasone (FLONASE) 50 MCG/ACT nasal spray Place 2 sprays into both nostrils daily as needed for allergies. 03/31/20   [provider]  fluticasone-salmeterol (ADVAIR HFA) 811-91 MCG/ACT inhaler Inhale 2 puffs by mouth twice daily 07/07/21   Erin Fulling, MD  furosemide (LASIX) 80 MG tablet Take 80 mg by mouth every morning.    [provider]  gabapentin (NEURONTIN) 600 MG tablet Take 600 mg by mouth 3 (three) times daily.    [provider]  hydrochlorothiazide (HYDRODIURIL) 25 MG tablet Take 25 mg by mouth every morning.    [provider]  HYDROcodone-acetaminophen (NORCO) 5-325 MG tablet Take 1 tablet by mouth every 6 (six) hours as needed for moderate pain. 08/15/20   Kennedy Bucker, MD  ibuprofen (ADVIL) 800 MG tablet Take 800 mg by mouth every 8 (eight) hours as needed for pain. 07/21/20   [provider]  indomethacin (INDOCIN) 25 MG capsule Take 25 mg by mouth daily.    [provider]  LANTUS SOLOSTAR 100 UNIT/ML Solostar Pen Inject 80 Units into the skin every morning. 05/16/20   [provider]  levothyroxine (SYNTHROID) 200 MCG tablet Take 200 mcg by mouth daily before breakfast.    [provider]  Melatonin 10 MG TABS Take 10 mg by mouth at bedtime. Patient not taking: Reported on 08/15/2020    [provider]  methocarbamol (ROBAXIN) 500 MG tablet Take 1-2 tablets by mouth every 6 (six) hours as needed for muscle spasms. 07/18/20   [provider]  Metoprolol Tartrate 75 MG TABS Take 1 tablet by mouth 2 (two) times daily.    [provider]  montelukast (SINGULAIR) 10 MG tablet Take 10 mg by mouth every morning.    [provider]  [facility] Rvr Fall powder Apply 1 Application topically 2 (two) times daily.    [provider]  olopatadine (PATANOL) 0.1 % ophthalmic solution Place 1 drop into both eyes daily.    [provider]  OXYGEN Inhale 3-4 L into the lungs  daily.    [provider]  promethazine (PHENERGAN) 25 MG tablet Take 25 mg by mouth every 6 (six) hours as needed for nausea or vomiting.    [provider]  psyllium (REGULOID) 0.52 g capsule Take 0.52 g by mouth 2 (two) times daily.    [provider]  Tiotropium Bromide Monohydrate (SPIRIVA RESPIMAT) 1.25 MCG/ACT AERS Inhale 2 puffs into the lungs daily for 30 doses. 10/03/20 11/02/20  Erin Fulling, MD  tiZANidine (ZANAFLEX) 4 MG tablet Take 4 mg by mouth at bedtime.    [provider]  tretinoin (RETIN-A) 0.05 % cream Apply to face qhs, wash off qam 02/18/22   Deirdre Evener, MD  TRULICITY 0.75 MG/0.5ML SOPN Inject 0.75 mg into the skin once a week. wednesday 07/08/20   [provider]  VALERIAN ROOT PO Take 1,200 mg by mouth at bedtime.    [provider]     Critical care provider statement:   Total critical care time: 33 minutes   Performed by: Karna Christmas MD   Critical care time was exclusive of separately billable procedures and treating other patients.   Critical care was necessary to treat or prevent imminent or life-threatening deterioration.   Critical care was time spent personally by me on the following activities: development of treatment plan with patient and/or surrogate as well as nursing, discussions with consultants, evaluation of patient's response to treatment, examination of patient, obtaining history from patient or surrogate, ordering and performing treatments and interventions, ordering and review of laboratory studies, ordering and review of radiographic studies, pulse oximetry and re-evaluation of patient's condition.    Vida Rigger, M.D.  Pulmonary & Critical Care Medicine

## 2023-05-06 NOTE — Consult Note (Signed)
 PHARMACY - ANTICOAGULATION CONSULT NOTE  Pharmacy Consult for Heparin Infusion Indication: atrial fibrillation  Patient Measurements: Height: 5' 7.99" (172.7 cm) Weight: 106 kg (233 lb 11 oz) IBW/kg (Calculated) : 63.88 Heparin Dosing Weight: 88.8 kg  Labs: Recent Labs    05/04/23 0424 05/04/23 1134 05/05/23 0315 05/06/23 0638  HGB 8.9*  --  10.5* 11.5*  HCT 26.7*  --  31.5* 34.8*  PLT 428*  --  516* 590*  HEPARINUNFRC 0.31  --  0.33 0.44  CREATININE 1.09* 1.02* 0.80  --    Estimated Creatinine Clearance: 84.6 mL/min (by C-G formula based on SCr of 0.8 mg/dL).  Medical History: Past Medical History:  Diagnosis Date   Arthritis    Asthma    Cataract of both eyes    Complication of anesthesia    woke up during surgery x1   COPD (chronic obstructive pulmonary disease) (HCC)    Coronary artery disease    DDD (degenerative disc disease), lumbar    Diabetes mellitus without complication (HCC)    Dyspnea    DUE TO COPD   Hernia of abdominal wall    History of hiatal hernia    small   Hyperlipidemia    Hypertension    Hypothyroidism    hashimotos throiditis  -thyroidectomy  2000   Occasional tremors    Oxygen desaturation    NOW PT ON 3-4 LITERS [place] DAILY   Pertinent Medications: PTA apixaban, last dose unknown  Last dose heparin sq 2/15 @0522 .   Assessment: 68 year old female with a past medical history significant for COPD on home supplemental oxygen and diabetes mellitus who presented to [facility] ED on 04/24/2023 due to unresponsiveness. PMH of afib on apixaban. CHADSVASc 3. Started on CRRT 2/15. Pharmacy was consulted to dose and manage this patient's heparin.   Date Time aPTT/HL Rate/Comment  2/15 1947 45/---  1200/SUBtherapeutic 2/16 0552 72/0.81 1500/aPTT therapeutic  2/16    1336     67/--                1500/aPTT therapeutic    2/16 2115 62/--  1500/aPTT SUBtherapeutic 2/17 0616 69/0.65 1650/both levels therapeutic x 1 2/17 1352 74/0.61 1650/both levels  therapeutic x 2 2/18 0415 -- / 0.62 1650/therapeutic x 3 2/19 0406 -- / 0.42 1650/therapeutic x 4 2/20 0403 -- / 0.39 1650/therapeutic x 5 2/21 0444 -- / 0.39 1650/therapeutic x 6 2/22 0253 -- / 0.39 1650/therapeutic x 7 2/23 0424 -- / 0.31 1650/therapeutic x 8 2/24 0315 -- / 0.33 1650/therapeutic x9 2/25     0638         0.44           1650/therapeutic X 10  Baseline Labs:  Hgb 9.5 Plt 325 INR/PT 1.1/14.3  HL 0.67 - possibly has some false elevated due to apixaban.   Goal of Therapy:  Heparin level 0.3-0.7 units/ml aPTT 66-102 seconds Monitor platelets by anticoagulation protocol: Yes  Plan:  Continue heparin infusion at 1650 units/hr Recheck HL daily w/ AM labs while therapeutic Monitor CBC and HL daily while on heparin   Sarah Norton D 05/06/2023 7:08 AM

## 2023-05-06 NOTE — Progress Notes (Signed)
 Nutrition Follow-up  DOCUMENTATION CODES:   Obesity unspecified  INTERVENTION:   Vital 1.2@60ml /hr- Initiate at 34ml/hr and increase by 25ml/hr q 8 hours until goal rate is reached.   ProSource TF 20- Give 60ml daily via tube, each supplement provides 80kcal and 20g of protein.   Free water flushes q4 hours   Regimen provides 1808kcal/day, 128g/day protein and 179ml/day of free water.   Pt at high refeed risk; recommend monitor potassium, magnesium and phosphorus labs daily until stable  Juven Fruit Punch BID via tube, each serving provides 95kcal and 2.5g of protein (amino acids glutamine and arginine)  Daily weights   NUTRITION DIAGNOSIS:   Inadequate oral intake related to inability to eat as evidenced by NPO status. -ongoing   GOAL:   Provide needs based on ASPEN/SCCM guidelines -not met   MONITOR:   Vent status, Labs, Weight trends, TF tolerance, I & O's, Skin  ASSESSMENT:   68 y/o female with h/o DM, HTN, thyroid disease s/p thyroidectomy, COPD, hiatal hernia s/p repair, polycythemia, HLD, DDD, Afib, CAD and CKD III who is admitted with Flu A, AKI, sepsis, shock, PNA and COPD exacerbation.  Pt re-intubated today. OGT in place. Will plan to initiate tube feeds today. Per chart, pt is down ~8lbs since admission. Pt -3.1L on her I & Os.   Medications reviewed and include: azithromycin, dexamethasone, colace, insulin, synthroid, MVI, protonix, miralax, senokot, heparin  Labs reviewed: Na 144 wnl, K 3.8 wnl, BUN 60(H), creat 1.09(H), P 5.1(H), Mg 2.2 wnl Wbc- 28.8(H), Hgb 11.5(L), Hct 34.8(L) Cbgs- 147, 116, 110 x 24 hrs   Patient is currently intubated on ventilator support MV: 7.0 L/min Temp (24hrs), Avg:98.1 F (36.7 C), Min:97.9 F (36.6 C), Max:98.4 F (36.9 C)  MAP >30mmHg   UOP-   Diet Order:   Diet Order             Diet NPO time specified Except for: Ice Chips  Diet effective now                  EDUCATION NEEDS:   Not  appropriate for education at this time  Skin:  Skin Assessment: Reviewed RN Assessment (blanchable redness anus)  Last BM:  2/23- type 7  Height:   Ht Readings from Last 1 Encounters:  05/02/23 5' 7.99" (1.727 m)    Weight:   Wt Readings from Last 1 Encounters:  05/05/23 106 kg    Ideal Body Weight:  63.6 kg  BMI:  Body mass index is 35.54 kg/m.  Estimated Nutritional Needs:   Kcal:  1800-2100kcal/day  Protein:  >125g/day  Fluid:  1.7-1.9L/day  Betsey Holiday MS, RD, LDN If unable to be reached, please send secure chat to "RD inpatient" available from 8:00a-4:00p daily

## 2023-05-06 NOTE — Procedures (Signed)
 Endotracheal Intubation: Patient required placement of an artificial airway secondary to acute encephalopathy and acute respiratory failure    Consent: Emergent.    Hand washing performed prior to starting the procedure.    Medications administered for sedation prior to procedure:  Versed 2 IV, 20 mg Etomidate IV, Rocuronium 100 mg IV     A time out procedure was called and correct patient, name, & ID confirmed. Needed supplies and equipment were assembled and checked to include ETT, 10 ml syringe, Glidescope, Mac and Miller blades, suction, oxygen and bag mask valve, end tidal CO2 monitor.    Patient was positioned to align the mouth and pharynx to facilitate visualization of the glottis.    Heart rate, SpO2 and blood pressure was continuously monitored during the procedure. Pre-oxygenation was conducted prior to intubation and endotracheal tube was placed through the vocal cords into the trachea.       The artificial airway was placed under direct visualization via glidescope route using a 8 cm  ETT on the first attempt.   ETT was secured at 24 cm.   Placement was confirmed by auscuitation of lungs with good breath sounds bilaterally and no stomach sounds.  Condensation was noted on endotracheal tube.   Pulse ox 94% CO2 detector in place with appropriate color change.    Complications: None .        Chest radiograph ordered and pending.   Zada Girt, AGNP  Pulmonary/Critical Care Pager [phone] (please enter 7 digits) PCCM Consult Pager [phone] (please enter 7 digits)

## 2023-05-06 NOTE — Plan of Care (Signed)
  Problem: Education: Goal: Knowledge of General Education information will improve Description: Including pain rating scale, medication(s)/side effects and non-pharmacologic comfort measures Outcome: Not Progressing   Problem: Health Behavior/Discharge Planning: Goal: Ability to manage health-related needs will improve Outcome: Not Progressing   Problem: Clinical Measurements: Goal: Ability to maintain clinical measurements within normal limits will improve Outcome: Not Progressing Goal: Will remain free from infection Outcome: Not Progressing Goal: Diagnostic test results will improve Outcome: Not Progressing Goal: Respiratory complications will improve Outcome: Not Progressing Goal: Cardiovascular complication will be avoided Outcome: Not Progressing   Problem: Activity: Goal: Risk for activity intolerance will decrease Outcome: Not Progressing   Problem: Nutrition: Goal: Adequate nutrition will be maintained Outcome: Not Progressing   Problem: Coping: Goal: Level of anxiety will decrease Outcome: Not Progressing   Problem: Elimination: Goal: Will not experience complications related to bowel motility Outcome: Not Progressing Goal: Will not experience complications related to urinary retention Outcome: Not Progressing   Problem: Pain Managment: Goal: General experience of comfort will improve and/or be controlled Outcome: Not Progressing   Problem: Safety: Goal: Ability to remain free from injury will improve Outcome: Not Progressing   Problem: Skin Integrity: Goal: Risk for impaired skin integrity will decrease Outcome: Not Progressing   Problem: Education: Goal: Ability to describe self-care measures that may prevent or decrease complications (Diabetes Survival Skills Education) will improve Outcome: Not Progressing   Problem: Coping: Goal: Ability to adjust to condition or change in health will improve Outcome: Not Progressing   Problem: Fluid  Volume: Goal: Ability to maintain a balanced intake and output will improve Outcome: Not Progressing   Problem: Health Behavior/Discharge Planning: Goal: Ability to identify and utilize available resources and services will improve Outcome: Not Progressing Goal: Ability to manage health-related needs will improve Outcome: Not Progressing   Problem: Metabolic: Goal: Ability to maintain appropriate glucose levels will improve Outcome: Not Progressing   Problem: Nutritional: Goal: Maintenance of adequate nutrition will improve Outcome: Not Progressing Goal: Progress toward achieving an optimal weight will improve Outcome: Not Progressing   Problem: Skin Integrity: Goal: Risk for impaired skin integrity will decrease Outcome: Not Progressing   Problem: Tissue Perfusion: Goal: Adequacy of tissue perfusion will improve Outcome: Not Progressing

## 2023-05-06 NOTE — Plan of Care (Addendum)
 Patient is alert to self, place, year and president on initial assessment and continued to be alert to self and place thru shift. Patient tolerating bipap this shift. PRN IV Ativan given x's 2 which were effective. Patient denies pain. Patient continues to have increased work of breathing, RR 24-34 with accessory muscle use noted and with morphine given PRN X's 2 this shift.  Amiodarone gtt continues per order. Heparin continues to infuse at 16.5 ml/hr. UOP via foley cathter was 350 ml this shift. Patients last BM was 05/04/23. Oral care being provided with repositioning.  Problem: Education: Goal: Knowledge of General Education information will improve Description: Including pain rating scale, medication(s)/side effects and non-pharmacologic comfort measures Outcome: Progressing   Problem: Clinical Measurements: Goal: Ability to maintain clinical measurements within normal limits will improve Outcome: Not Progressing Goal: Will remain free from infection Outcome: Not Progressing Goal: Diagnostic test results will improve Outcome: Not Progressing Goal: Respiratory complications will improve Outcome: Not Progressing   Problem: Nutrition: Goal: Adequate nutrition will be maintained Outcome: Not Progressing   Problem: Elimination: Goal: Will not experience complications related to urinary retention Outcome: Not Progressing   Problem: Pain Managment: Goal: General experience of comfort will improve and/or be controlled Outcome: Progressing   Problem: Safety: Goal: Ability to remain free from injury will improve Outcome: Progressing   Problem: Skin Integrity: Goal: Risk for impaired skin integrity will decrease Outcome: Progressing

## 2023-05-06 NOTE — Progress Notes (Signed)
 Called significant other and advised we will be reintubating patient-per MD

## 2023-05-06 NOTE — Consult Note (Signed)
 PHARMACY CONSULT NOTE - ELECTROLYTES  Pharmacy Consult for Electrolyte Monitoring and Replacement   Recent Labs:  Height: 5' 7.99" (172.7 cm) Weight: 106 kg (233 lb 11 oz) IBW/kg (Calculated) : 63.88 Estimated Creatinine Clearance: 62.1 mL/min (A) (by C-G formula based on SCr of 1.09 mg/dL (H)). Potassium (mmol/L)  Date Value  05/06/2023 3.8  11/16/2013 3.5   Magnesium (mg/dL)  Date Value  16/12/9602 2.2  11/16/2013 1.0 (L)   Calcium (mg/dL)  Date Value  54/11/8117 9.7   Calcium, Total (mg/dL)  Date Value  14/78/2956 9.1   Albumin (g/dL)  Date Value  21/30/8657 3.4 (L)  11/16/2013 3.8   Phosphorus (mg/dL)  Date Value  84/69/6295 5.1 (H)   Sodium (mmol/L)  Date Value  05/06/2023 144  11/16/2013 133 (L)    Assessment  Sarah Norton is a 68 y.o. female presenting with respiratory failure and unresponsive. PMH significant for DM and COPD. Patient was started on CRRT 2/15 then taken off evening of 2/18. They continue to have good urine output. Nephrology has signed off. Pharmacy has been consulted to monitor and replace electrolytes while they're in the ICU.   Diet: NPO, Has OG Nutrition: Juven powder: 1 packet BID Pertinent medications: NA  Goal of Therapy: Electrolytes WNL (aim for K > 4 and Mg > 2 due to hx of afib) K = 3.8 Mg = 2.2 Phos = 5.1 Na = 144  Plan:  Give 20 mEq Kcl PO x 1 Continue to follow-up electrolytes with AM labs tomorrow  Thank you for allowing pharmacy to be a part of this patient's care.  Effie Shy, PharmD Pharmacy Resident  05/06/2023 7:32 AM

## 2023-05-06 NOTE — Progress Notes (Signed)
 OT Cancellation Note  Patient Details Name: Sarah Norton MRN: [id number] DOB: [birth year]   Cancelled Treatment:    Reason Eval/Treat Not Completed: Other (comment);Patient not medically ready (per discussion with nurse, pt had been re-intubated, sedated at this time. OT will hold at this time, follow up regarding sedation level/appropriateness for OT eval tomorrow.)  Oleta Mouse, OTD OTR/L  05/06/23, 1:21 PM

## 2023-05-07 ENCOUNTER — Encounter: Payer: Self-pay | Admitting: Pulmonary Disease

## 2023-05-07 DIAGNOSIS — J9601 Acute respiratory failure with hypoxia: Secondary | ICD-10-CM | POA: Diagnosis not present

## 2023-05-07 DIAGNOSIS — J9602 Acute respiratory failure with hypercapnia: Secondary | ICD-10-CM | POA: Diagnosis not present

## 2023-05-07 DIAGNOSIS — J111 Influenza due to unidentified influenza virus with other respiratory manifestations: Secondary | ICD-10-CM

## 2023-05-07 DIAGNOSIS — J189 Pneumonia, unspecified organism: Secondary | ICD-10-CM

## 2023-05-07 DIAGNOSIS — J441 Chronic obstructive pulmonary disease with (acute) exacerbation: Secondary | ICD-10-CM | POA: Diagnosis not present

## 2023-05-07 LAB — HEPARIN LEVEL (UNFRACTIONATED): Heparin Unfractionated: 0.36 [IU]/mL (ref 0.30–0.70)

## 2023-05-07 LAB — CBC
HCT: 31.2 % — ABNORMAL LOW (ref 36.0–46.0)
Hemoglobin: 10.4 g/dL — ABNORMAL LOW (ref 12.0–15.0)
MCH: 31.2 pg (ref 26.0–34.0)
MCHC: 33.3 g/dL (ref 30.0–36.0)
MCV: 93.7 fL (ref 80.0–100.0)
Platelets: 553 10*3/uL — ABNORMAL HIGH (ref 150–400)
RBC: 3.33 MIL/uL — ABNORMAL LOW (ref 3.87–5.11)
RDW: 17.2 % — ABNORMAL HIGH (ref 11.5–15.5)
WBC: 20.4 10*3/uL — ABNORMAL HIGH (ref 4.0–10.5)
nRBC: 0 % (ref 0.0–0.2)

## 2023-05-07 LAB — GLUCOSE, CAPILLARY
Glucose-Capillary: 164 mg/dL — ABNORMAL HIGH (ref 70–99)
Glucose-Capillary: 168 mg/dL — ABNORMAL HIGH (ref 70–99)
Glucose-Capillary: 200 mg/dL — ABNORMAL HIGH (ref 70–99)
Glucose-Capillary: 201 mg/dL — ABNORMAL HIGH (ref 70–99)
Glucose-Capillary: 287 mg/dL — ABNORMAL HIGH (ref 70–99)
Glucose-Capillary: 303 mg/dL — ABNORMAL HIGH (ref 70–99)

## 2023-05-07 LAB — RENAL FUNCTION PANEL
Albumin: 2.9 g/dL — ABNORMAL LOW (ref 3.5–5.0)
Anion gap: 10 (ref 5–15)
BUN: 68 mg/dL — ABNORMAL HIGH (ref 8–23)
CO2: 31 mmol/L (ref 22–32)
Calcium: 9.2 mg/dL (ref 8.9–10.3)
Chloride: 98 mmol/L (ref 98–111)
Creatinine, Ser: 1.38 mg/dL — ABNORMAL HIGH (ref 0.44–1.00)
GFR, Estimated: 41 mL/min — ABNORMAL LOW (ref >60)
Glucose, Bld: 186 mg/dL — ABNORMAL HIGH (ref 70–99)
Phosphorus: 2.7 mg/dL (ref 2.5–4.6)
Potassium: 3.6 mmol/L (ref 3.5–5.1)
Sodium: 139 mmol/L (ref 135–145)

## 2023-05-07 LAB — TRIGLYCERIDES: Triglycerides: 129 mg/dL (ref <150)

## 2023-05-07 LAB — MRSA NEXT GEN BY PCR, NASAL: MRSA by PCR Next Gen: NOT DETECTED

## 2023-05-07 LAB — C-REACTIVE PROTEIN: CRP: 3 mg/dL — ABNORMAL HIGH (ref <1.0)

## 2023-05-07 LAB — MAGNESIUM: Magnesium: 2.2 mg/dL (ref 1.7–2.4)

## 2023-05-07 MED ORDER — ORAL CARE MOUTH RINSE: 15.0000 mL | OROMUCOSAL | Status: DC | PRN

## 2023-05-07 MED ORDER — INSULIN ASPART 100 UNIT/ML IJ SOLN
5.0000 [IU] | Freq: Once | INTRAMUSCULAR | Status: AC
  Administered 2023-05-07: 5 [IU] via SUBCUTANEOUS
  Filled 2023-05-07: qty 1

## 2023-05-07 MED ORDER — SODIUM CHLORIDE 0.9 % IV SOLN: 250.0000 mL | INTRAVENOUS | Status: AC

## 2023-05-07 MED ORDER — NOREPINEPHRINE 4 MG/250ML-% IV SOLN
2.0000 ug/min | INTRAVENOUS | Status: DC
  Administered 2023-05-07: 2 ug/min via INTRAVENOUS
  Administered 2023-05-07 – 2023-05-08 (×2): 6 ug/min via INTRAVENOUS
  Filled 2023-05-07 (×2): qty 250

## 2023-05-07 MED ORDER — POTASSIUM CHLORIDE 10 MEQ/100ML IV SOLN
10.0000 meq | INTRAVENOUS | Status: AC
Start: 2023-05-07 — End: 2023-05-07
  Administered 2023-05-07 (×4): 10 meq via INTRAVENOUS
  Filled 2023-05-07 (×4): qty 100

## 2023-05-07 MED ORDER — ORAL CARE MOUTH RINSE
15.0000 mL | OROMUCOSAL | Status: DC
  Administered 2023-05-07 – 2023-05-11 (×40): 15 mL via OROMUCOSAL

## 2023-05-07 MED ORDER — PIPERACILLIN-TAZOBACTAM 3.375 G IVPB
3.3750 g | Freq: Three times a day (TID) | INTRAVENOUS | Status: AC
  Administered 2023-05-07 – 2023-05-11 (×14): 3.375 g via INTRAVENOUS
  Filled 2023-05-07 (×14): qty 50

## 2023-05-07 NOTE — Progress Notes (Signed)
 NAME:  Sarah Norton, MRN:  [id number], DOB:  [birth year], LOS: 13 ADMISSION DATE:  04/24/2023, CONSULTATION DATE:  04/24/23 REFERRING MD:  Dr. Rosalia Hammers, CHIEF COMPLAINT:  Acute Respiratory Distress, hypoxia, AMS   Brief Pt Description / Synopsis:  68 y.o female admitted with Acute Metabolic Encephalopathy and Acute Hypoxic and Hypercapnic Respiratory Failure in the setting of Acute COPD Exacerbation due to Influenza A infection and questionable superimposed bacterial pneumonia, failed trial of BiPAP requiring intubation and mechanical ventilation.  Course complicated by AKI requiring initiation of CRRT.  History of Present Illness:  Sarah Norton is a 68 year old female with a past medical history significant for COPD on home supplemental oxygen and diabetes mellitus who presented to [facility] ED on 04/24/2023 due to unresponsiveness.  Upon EMS arrival she was noted to be hypoxic with O2 saturations in the 40s for which she was placed on CPAP with improvement in sats to the 80s.  Upon arrival to the ED she remained somnolent but slightly arousable, and given her hypoxia, she was transitioned to BiPAP  ED Course: Initial Vital Signs: Temperature 96.5 F axillary, pulse 87, respiratory rate 33, blood pressure 136/97, SpO2 78% Significant Labs: Sodium 134, glucose 313, BUN 59, creatinine 1.27, BNP 436, high-sensitivity troponin 139, lactic acid 1.7, WBC 33.4, D-dimer 2.13 VBG: pH 7.19/pCO2 82/pO2 39/bicarb 31.3 Positive for influenza A Urinalysis negative for UTI Imaging Chest X-ray>>IMPRESSION: *Bibasilar heterogeneous opacities, favored to represent multilobar pneumonia. Follow-up to clearing is recommended. CTa Chest>> pending currently Medications Administered: 1 L normal saline bolus, 125 mg Solu-Medrol, IV azithromycin and ceftriaxone, Levophed drip initiated Despite BiPAP patient's ABG worsened with worsening mental status.  Therefore ED provider elected to intubate.  PCCM is asked to admit for further  workup and treatment.   Please see "significant hospital events" section below for full detailed hospital course.   Pertinent  Medical History   Past Medical History:  Diagnosis Date   Arthritis    Asthma    Cataract of both eyes    Complication of anesthesia    woke up during surgery x1   COPD (chronic obstructive pulmonary disease) (HCC)    Coronary artery disease    DDD (degenerative disc disease), lumbar    Diabetes mellitus without complication (HCC)    Dyspnea    DUE TO COPD   Hernia of abdominal wall    History of hiatal hernia    small   Hyperlipidemia    Hypertension    Hypothyroidism    hashimotos throiditis  -thyroidectomy  2000   Occasional tremors    Oxygen desaturation    NOW PT ON 3-4 LITERS [place] DAILY    Micro Data:  2/13: COVID/FLU/RSV PCR>> + Influenza A 2/13: Blood culture x2>> no growth 2/13: Strep pneumo urinary antigen>> negative 2/13: Legionella urinary antigen>> negative 2/13: HIV screen>>negative 2/13: MRSA PCR>> negative 2/15: Tracheal aspirate>> no growth 2/25: Repeat Tracheal aspirate>> Gram + cocci & rods 2/26: MRSA PCR>> negative  Antimicrobials:   Anti-infectives (From admission, onward)    Start     Dose/Rate Route Frequency Ordered Stop   05/06/23 1230  azithromycin (ZITHROMAX) tablet 250 mg        250 mg Per Tube Daily 05/06/23 1140     04/30/23 2200  ceFEPIme (MAXIPIME) 1 g in sodium chloride 0.9 % 100 mL IVPB  Status:  Discontinued        1 g 200 mL/hr over 30 Minutes Intravenous Every 24 hours 04/29/23 1842 04/30/23 1006  04/29/23 1145  linezolid (ZYVOX) tablet 600 mg  Status:  Discontinued        600 mg Per Tube Every 12 hours 04/29/23 1052 04/30/23 1006   04/28/23 1015  vancomycin (VANCOREADY) IVPB 1250 mg/250 mL        1,250 mg 166.7 mL/hr over 90 Minutes Intravenous  Once 04/28/23 0921 04/28/23 1209   04/28/23 1000  oseltamivir (TAMIFLU) 6 MG/ML suspension 30 mg  Status:  Discontinued        30 mg Per Tube Daily  04/28/23 0737 04/28/23 0747   04/28/23 1000  oseltamivir (TAMIFLU) 6 MG/ML suspension 75 mg        75 mg Per Tube Daily 04/28/23 0747 04/29/23 1155   04/27/23 1000  oseltamivir (TAMIFLU) 6 MG/ML suspension 75 mg  Status:  Discontinued        75 mg Per Tube Daily 04/26/23 1834 04/28/23 0737   04/27/23 0815  vancomycin (VANCOCIN) IVPB 1000 mg/200 mL premix        1,000 mg 200 mL/hr over 60 Minutes Intravenous  Once 04/27/23 0724 04/27/23 0921   04/26/23 2200  ceFEPIme (MAXIPIME) 2 g in sodium chloride 0.9 % 100 mL IVPB  Status:  Discontinued        2 g 200 mL/hr over 30 Minutes Intravenous Every 12 hours 04/26/23 1834 04/29/23 1842   04/26/23 1000  oseltamivir (TAMIFLU) 6 MG/ML suspension 30 mg  Status:  Discontinued        30 mg Per Tube Daily 04/25/23 1017 04/26/23 1834   04/25/23 2000  vancomycin (VANCOREADY) IVPB 1250 mg/250 mL  Status:  Discontinued        1,250 mg 166.7 mL/hr over 90 Minutes Intravenous Every 24 hours 04/24/23 2106 04/25/23 1151   04/25/23 1300  ceFEPIme (MAXIPIME) 2 g in sodium chloride 0.9 % 100 mL IVPB  Status:  Discontinued        2 g 200 mL/hr over 30 Minutes Intravenous Every 24 hours 04/25/23 1157 04/26/23 1834   04/25/23 1200  azithromycin (ZITHROMAX) 500 mg in sodium chloride 0.9 % 250 mL IVPB        500 mg 250 mL/hr over 60 Minutes Intravenous Every 24 hours 04/24/23 1557 04/28/23 1312   04/25/23 1150  vancomycin variable dose per unstable renal function (pharmacist dosing)  Status:  Discontinued         Does not apply See admin instructions 04/25/23 1151 04/29/23 1052   04/25/23 1100  cefTRIAXone (ROCEPHIN) 2 g in sodium chloride 0.9 % 100 mL IVPB  Status:  Discontinued        2 g 200 mL/hr over 30 Minutes Intravenous Every 24 hours 04/24/23 1557 04/25/23 1021   04/24/23 2200  oseltamivir (TAMIFLU) 6 MG/ML suspension 30 mg  Status:  Discontinued        30 mg Per Tube 2 times daily 04/24/23 1559 04/25/23 1017   04/24/23 2045  vancomycin (VANCOREADY) IVPB  2000 mg/400 mL        2,000 mg 200 mL/hr over 120 Minutes Intravenous  Once 04/24/23 2034 04/25/23 0045   04/24/23 1245  cefTRIAXone (ROCEPHIN) 2 g in sodium chloride 0.9 % 100 mL IVPB        2 g 200 mL/hr over 30 Minutes Intravenous Once 04/24/23 1244 04/24/23 1407   04/24/23 1245  azithromycin (ZITHROMAX) 500 mg in sodium chloride 0.9 % 250 mL IVPB        500 mg 250 mL/hr over 60 Minutes Intravenous  Once 04/24/23  1244 04/24/23 1513       Significant Hospital Events: Including procedures, antibiotic start and stop dates in addition to other pertinent events   2/13: Presented to ED with AMS, acute respiratory distress and hypoxia.  Initially trialed on BiPAP but with worsening ABG and mental status.  ED provider intubated.  PCCM asked to admit. 04/25/2023 - Patient with improving respiratory status however with worsening wbc and procalcitonin.  04/26/2023 - HD cath placed and started on CRRT. 2/17 remains on vent, on CRRT 02/18: CRRT discontinued  02/19: On minimal vent settings.  Plan for WUA/SBT as tolerated utilizing Precedex.  With SBT, increased RR/WOB/accessory muscle use.  Adequate urine output and electrolytes acceptable, no plan for HD today. 02/20: Remains on minimal vent support, SBT as tolerated. On WUA with Precedex, she opens eyes but not following commands,  MRI Brain negative for acute intracranial abnormality. Faild SBT due to tachypnea (RR 40's) and increased WOB 02/21: No significant events noted overnight.  Afebrile, hemodynamically stable, not requiring vasopressors. Leukocytosis improving. On minimal vent support, plan for WUA and SBT as tolerated ~ failed SBT due to tachypnea (RR 40's) and increased WOB.  Not following commands on WUA, will obtain EEG. 05/03/2023: Failed SBT due to altered mental status.   05/04/23: Tolerated SBT and was extubated to HF [place] 05/05/23- patient passed SLP for ice chips only thus far. Remains on HHFNC weaned to 40/40, drowsy during interview.   05/06/23- patient with GCS7 on exam this morning with labored breathing on BIPAP and tachyarrythmia. Will proceed with intubation emergently.  Family contacted husband is POA 05/07/23: Will not perform SBT today as she was re-intubated yesterday, likely needs Trach.  Tracheal aspirate with gram+ rods & cocci, low grade fever, will start empiric Zosyn and stop Azithromycin.   Objective   Blood pressure 111/72, pulse 73, temperature (!) 100.5 F (38.1 C), temperature source Axillary, resp. rate (!) 27, height 5' 7.99" (1.727 m), weight 103.9 kg, SpO2 95%.    Vent Mode: PRVC FiO2 (%):  [40 %-80 %] 40 % Set Rate:  [16 bmp] 16 bmp Vt Set:  [440 mL] 440 mL PEEP:  [5 cmH20-8 cmH20] 8 cmH20 Plateau Pressure:  [15 cmH20-21 cmH20] 18 cmH20   Intake/Output Summary (Last 24 hours) at 05/07/2023 8413 Last data filed at 05/07/2023 2440 Gross per 24 hour  Intake 2526.44 ml  Output 905 ml  Net 1621.44 ml   Filed Weights   05/04/23 0500 05/05/23 0704 05/07/23 0500  Weight: 112.7 kg 106 kg 103.9 kg    Examination: General: Acute on chronically ill appearing obese female, laying in bed, intubated and sedated, in NAD HENT: Atraumatic, normocephalic, PERRLA, neck supple, difficult to assess JVD due to body habitus   Lungs: Coarse breath sounds throughout, even, overbreathing the vent Cardiovascular: Irregularly irregular rhythm, rate controlled, no murmurs, rubs, gallops Abdomen: Obese, soft, distended, hypoactive bowel sounds, no pain with palpation Extremities: Normal bulk and tone, no deformities, 1+edema to bilateral lower extremities, venous stasis discoloration bilateral lower extremities Neuro: Sedated, currently not following commands, withdraws from pain, pupils PERRL 4 mm bilaterally brisk GU: Foley catheter in place draining yellow urine   Assessment & Plan:   #Acute Hypoxic & Hypercapnic Respiratory Failure in the setting of AECOPD, Influenza infection, and questionable superimposed  bacterial pneumonia EXTUBATED 2/23, REINTUBATED 2/25 CTa Chest on 2/13: negative for PE, AAA or dissection. Concerning for interstitial and alveolar edema vs pneumonitis, and bibasilar consolidation (R >L). -Full vent support, implement lung protective  strategies -Plateau pressures less than 30 cm H20 -Wean FiO2 & PEEP as tolerated to maintain O2 sats 88 to 92% -Follow intermittent Chest X-ray & ABG as needed -Spontaneous Breathing Trials when respiratory parameters met and mental status permits -Implement VAP Bundle -Bronchodilators -ABX as above -Completed course of Tamiflu  #Shock: Suspect Septic  #Elevated Troponin in setting of demand ischemia  #Atrial Fibrillation with RVR ~ RATE CONTROLLED Echocardiogram 04/25/23: LVEF 60-65%, mild LVH, normal diastolic parameters, RV systolic function is normal, moderately elevated pulmonary artery systolic pressure, mild to moderate AS -Continuous cardiac monitoring -Maintain MAP >65 -Vasopressors as needed to maintain MAP goal -Lactic acid has normalized -HS Troponin peaked at 130 -Diuresis as BP and renal function permits -Continue Heparin gtt: dosing per pharmacy  #Meets SIRS Criteria (RR 34, WBC 33.4) #Severe Sepsis in setting of Influenza A infection and questionable superimposed bacterial pneumonia ~ TREATED -PRESENT ON ADMISSION  #Concern for potential VAP vs Aspiration -Monitor fever curve -Trend WBC's  -Follow cultures as above -Completed course of Cefepime and Linezolid on 2/19 -Start empiric Zosyn pending cultures & sensitivities  #Acute Kidney Injury on CKD Stage IIIa  #Mild Hyponatremia ~ RESOLVED -Monitor I&O's / urinary output -Follow BMP -Ensure adequate renal perfusion -Avoid nephrotoxic agents as able -Replace electrolytes as indicated ~ Pharmacy following for assistance with electrolyte replacement -Nephrology has signed off  #Diabetes Mellitus -CBG's q4h; Target range of 140 to 180 -SSI -Follow ICU  Hypo/Hyperglycemia protocol  #Acute Metabolic Encephalopathy, suspect CO2 Narcosis #Sedation needs in setting of mechanical ventilation -CT Head on 2/13 negative for acute intracranial process -MRI Brain on 2/20 negative for acute intracranial process -EEG 2/21 suggestive of diffuse encephalopathy, no seizures or epileptiform discharges -Treatment of metabolic derangements as outlined above -Maintain a RASS goal of 0 to -1 -Propofol as needed to maintain RASS goal -Avoid sedating medications as able -Daily wake up assessment   Best Practice (right click and "Reselect all SmartList Selections" daily)   Diet/type: NPO, tube feeds DVT prophylaxis: Heparin gtt GI prophylaxis: PPI Lines: N/A Foley:  Yes, and it is still needed Code Status:  full code Last date of multidisciplinary goals of care discussion [2/26]  2/26: Will update pt's family when they arrive at bedside.  Labs   CBC: Recent Labs  Lab 05/03/23 0253 05/04/23 0424 05/05/23 0315 05/06/23 0638 05/07/23 0359  WBC 24.5* 16.9* 17.4* 28.8* 20.4*  HGB 9.0* 8.9* 10.5* 11.5* 10.4*  HCT 26.4* 26.7* 31.5* 34.8* 31.2*  MCV 88.9 91.4 90.5 93.0 93.7  PLT 406* 428* 516* 590* 553*    Basic Metabolic Panel: Recent Labs  Lab 05/03/23 0253 05/04/23 0424 05/04/23 1134 05/05/23 0315 05/06/23 0638 05/07/23 0359  NA 138 139 145 146* 144 139  K 3.8 3.5 4.2 3.7 3.8 3.6  CL 101 102 100 101 100 98  CO2 26 29 31  33* 32 31  GLUCOSE 205* 239* 204* 103* 130* 186*  BUN 75* 77* 76* 61* 60* 68*  CREATININE 1.08* 1.09* 1.02* 0.80 1.09* 1.38*  CALCIUM 9.5 9.6 10.4* 10.1 9.7 9.2  MG 1.9  --  1.8 2.0 2.2 2.2  PHOS 3.4 2.4* 3.2 3.3 5.1* 2.7   GFR: Estimated Creatinine Clearance: 48.5 mL/min (A) (by C-G formula based on SCr of 1.38 mg/dL (H)). Recent Labs  Lab 05/04/23 0424 05/05/23 0315 05/06/23 0638 05/07/23 0359  WBC 16.9* 17.4* 28.8* 20.4*    Liver Function Tests: Recent Labs  Lab 05/04/23 0424 05/04/23 1134  05/05/23 0315 05/06/23 4098 05/07/23 0359  AST  --  50*  --   --   --   ALT  --  92*  --   --   --   ALKPHOS  --  155*  --   --   --   BILITOT  --  1.0  --   --   --   PROT  --  7.6  --   --   --   ALBUMIN 2.6* 3.5 3.3* 3.4* 2.9*   No results for input(s): "LIPASE", "AMYLASE" in the last 168 hours. No results for input(s): "AMMONIA" in the last 168 hours.  ABG    Component Value Date/Time   PHART 7.4 05/06/2023 1132   PCO2ART 64 (H) 05/06/2023 1132   PO2ART 86 05/06/2023 1132   HCO3 39.6 (H) 05/06/2023 1132   ACIDBASEDEF 3.2 (H) 04/27/2023 1551   O2SAT 97.5 05/06/2023 1132     Coagulation Profile: No results for input(s): "INR", "PROTIME" in the last 168 hours.   Cardiac Enzymes: No results for input(s): "CKTOTAL", "CKMB", "CKMBINDEX", "TROPONINI" in the last 168 hours.  HbA1C: Hemoglobin A1C  Date/Time Value Ref Range Status  11/16/2013 04:11 PM 6.5 (H) 4.2 - 6.3 % Final    Comment:    The American Diabetes Association recommends that a primary goal of therapy should be <7% and that physicians should reevaluate the treatment regimen in patients with HbA1c values consistently >8%.    Hgb A1c MFr Bld  Date/Time Value Ref Range Status  04/25/2023 04:11 AM 6.5 (H) 4.8 - 5.6 % Final    Comment:    (NOTE) Pre diabetes:          5.7%-6.4%  Diabetes:              >6.4%  Glycemic control for   <7.0% adults with diabetes   05/04/2020 04:33 AM 8.0 (H) 4.8 - 5.6 % Final    Comment:    (NOTE) Pre diabetes:          5.7%-6.4%  Diabetes:              >6.4%  Glycemic control for   <7.0% adults with diabetes     CBG: Recent Labs  Lab 05/06/23 1645 05/06/23 1929 05/06/23 2335 05/07/23 0327 05/07/23 0724  GLUCAP 151* 164* 159* 168* 164*    Review of Systems:   Unable to assess due to AMS/intubation/sedation   Past Medical History:  She,  has a past medical history of Arthritis, Asthma, Cataract of both eyes, Complication of anesthesia, COPD (chronic  obstructive pulmonary disease) (HCC), Coronary artery disease, DDD (degenerative disc disease), lumbar, Diabetes mellitus without complication (HCC), Dyspnea, Hernia of abdominal wall, History of hiatal hernia, Hyperlipidemia, Hypertension, Hypothyroidism, Occasional tremors, and Oxygen desaturation.   Surgical History:   Past Surgical History:  Procedure Laterality Date   ABDOMINAL HYSTERECTOMY     CHOLECYSTECTOMY     COLONOSCOPY WITH PROPOFOL N/A 09/08/2014   Procedure: COLONOSCOPY WITH PROPOFOL;  Surgeon: Midge Minium, MD;  Location: [facility] ENDOSCOPY;  Service: Endoscopy;  Laterality: N/A;   GANGLION CYST EXCISION Right 08/15/2020   Procedure: Right index finger cyst removal;  Surgeon: Kennedy Bucker, MD;  Location: [facility] ORS;  Service: Orthopedics;  Laterality: Right;   HIATAL HERNIA REPAIR  2000   OTHER SURGICAL HISTORY     vocal cord surgery due to injury during thyroid surgery   parathyroidectomy  2000   THYROIDECTOMY     TONSILLECTOMY     TUMOR REMOVAL Right Leg  Social History:   reports that she quit smoking about 7 years ago. Her smoking use included cigarettes. She started smoking about 55 years ago. She has a 48 pack-year smoking history. She has never used smokeless tobacco. She reports that she does not drink alcohol and does not use drugs.   Family History:  Her family history includes Breast cancer in her sister; Kidney disease in her mother.   Allergies No Known Allergies   Home Medications  Prior to Admission medications   Medication Sig Start Date End Date Taking? Authorizing Provider  MOUNJARO 10 MG/0.5ML Pen Inject 10 mg into the skin once a week. 03/27/23  Yes [provider]  albuterol (VENTOLIN HFA) 108 (90 Base) MCG/ACT inhaler Inhale 2 puffs into the lungs every 4 (four) hours as needed for wheezing or shortness of breath.    [provider]  allopurinol (ZYLOPRIM) 100 MG tablet Take 100 mg by mouth every morning.    [provider]   amLODipine (NORVASC) 5 MG tablet Take 5 mg by mouth every morning. 07/23/20   [provider]  atorvastatin (LIPITOR) 40 MG tablet Take 40 mg by mouth every morning.    [provider]  benazepril (LOTENSIN) 40 MG tablet Take 40 mg by mouth every morning.    [provider]  chlorhexidine (PERIDEX) 0.12 % solution Use as directed 15 mLs in the mouth or throat as needed.    [provider]  ELIQUIS 5 MG TABS tablet Take 5 mg by mouth 2 (two) times daily.    [provider]  fluticasone (FLONASE) 50 MCG/ACT nasal spray Place 2 sprays into both nostrils daily as needed for allergies. 03/31/20   [provider]  fluticasone-salmeterol (ADVAIR HFA) 696-29 MCG/ACT inhaler Inhale 2 puffs by mouth twice daily 07/07/21   Erin Fulling, MD  furosemide (LASIX) 80 MG tablet Take 80 mg by mouth every morning.    [provider]  gabapentin (NEURONTIN) 600 MG tablet Take 600 mg by mouth 3 (three) times daily.    [provider]  hydrochlorothiazide (HYDRODIURIL) 25 MG tablet Take 25 mg by mouth every morning.    [provider]  HYDROcodone-acetaminophen (NORCO) 5-325 MG tablet Take 1 tablet by mouth every 6 (six) hours as needed for moderate pain. 08/15/20   Kennedy Bucker, MD  ibuprofen (ADVIL) 800 MG tablet Take 800 mg by mouth every 8 (eight) hours as needed for pain. 07/21/20   [provider]  indomethacin (INDOCIN) 25 MG capsule Take 25 mg by mouth daily.    [provider]  LANTUS SOLOSTAR 100 UNIT/ML Solostar Pen Inject 80 Units into the skin every morning. 05/16/20   [provider]  levothyroxine (SYNTHROID) 200 MCG tablet Take 200 mcg by mouth daily before breakfast.    [provider]  Melatonin 10 MG TABS Take 10 mg by mouth at bedtime. Patient not taking: Reported on 08/15/2020    [provider]  methocarbamol (ROBAXIN) 500 MG tablet Take 1-2 tablets by mouth every 6 (six) hours as  needed for muscle spasms. 07/18/20   [provider]  Metoprolol Tartrate 75 MG TABS Take 1 tablet by mouth 2 (two) times daily.    [provider]  montelukast (SINGULAIR) 10 MG tablet Take 10 mg by mouth every morning.    [provider]  [facility] powder Apply 1 Application topically 2 (two) times daily.    [provider]  olopatadine (PATANOL) 0.1 % ophthalmic solution Place  1 drop into both eyes daily.    [provider]  OXYGEN Inhale 3-4 L into the lungs daily.    [provider]  promethazine (PHENERGAN) 25 MG tablet Take 25 mg by mouth every 6 (six) hours as needed for nausea or vomiting.    [provider]  psyllium (REGULOID) 0.52 g capsule Take 0.52 g by mouth 2 (two) times daily.    [provider]  Tiotropium Bromide Monohydrate (SPIRIVA RESPIMAT) 1.25 MCG/ACT AERS Inhale 2 puffs into the lungs daily for 30 doses. 10/03/20 11/02/20  Erin Fulling, MD  tiZANidine (ZANAFLEX) 4 MG tablet Take 4 mg by mouth at bedtime.    [provider]  tretinoin (RETIN-A) 0.05 % cream Apply to face qhs, wash off qam 02/18/22   Deirdre Evener, MD  TRULICITY 0.75 MG/0.5ML SOPN Inject 0.75 mg into the skin once a week. wednesday 07/08/20   [provider]  VALERIAN ROOT PO Take 1,200 mg by mouth at bedtime.    [provider]     Critical care provider statement:   Total critical care time: 40 minutes   Harlon Ditty, AGACNP-BC [facility] Prefer epic messenger for cross cover needs If after hours, please call E-link

## 2023-05-07 NOTE — Plan of Care (Signed)
  Problem: Clinical Measurements: Goal: Diagnostic test results will improve Outcome: Progressing Goal: Respiratory complications will improve Outcome: Progressing Goal: Cardiovascular complication will be avoided Outcome: Progressing   

## 2023-05-07 NOTE — Progress Notes (Signed)
 OT Cancellation Note  Patient Details Name: Sarah Norton MRN: [id number] DOB: April 30, 1955   Cancelled Treatment:    Reason Eval/Treat Not Completed: Other (comment) (per discussion with nurse, pt still intubated/sedated, RASS last recorded -3. Pt is not medically ready, OT will sign off at this time.)  Oleta Mouse, OTD OTR/L  05/07/23, 8:33 AM

## 2023-05-07 NOTE — Progress Notes (Signed)
 PT Cancellation Note  Patient Details Name: Sarah Norton MRN: [id number] DOB: 1955/06/29   Cancelled Treatment:    Reason Eval/Treat Not Completed: Other (comment). Pt still sedated and intubated. Discussed with RN this morning. 3rd attempt for eval- will sign off at this time. Please place new orders once medically stable.   Raissa Dam 05/07/2023, 8:49 AM Elizabeth Palau, PT, DPT, GCS [phone]

## 2023-05-07 NOTE — Plan of Care (Signed)
  Problem: Clinical Measurements: Goal: Respiratory complications will improve Outcome: Progressing   Problem: Nutrition: Goal: Adequate nutrition will be maintained Outcome: Progressing   Problem: Fluid Volume: Goal: Ability to maintain a balanced intake and output will improve Outcome: Progressing   Problem: Metabolic: Goal: Ability to maintain appropriate glucose levels will improve Outcome: Progressing   Problem: Nutritional: Goal: Maintenance of adequate nutrition will improve Outcome: Progressing   Problem: Respiratory: Goal: Ability to maintain a clear airway and adequate ventilation will improve Outcome: Progressing

## 2023-05-07 NOTE — Consult Note (Signed)
 PHARMACY - ANTICOAGULATION CONSULT NOTE  Pharmacy Consult for Heparin Infusion Indication: atrial fibrillation  Patient Measurements: Height: 5' 7.99" (172.7 cm) Weight: 106 kg (233 lb 11 oz) IBW/kg (Calculated) : 63.88 Heparin Dosing Weight: 88.8 kg  Labs: Recent Labs    05/05/23 0315 05/06/23 0638 05/06/23 1048 05/07/23 0359  HGB 10.5* 11.5*  --  10.4*  HCT 31.5* 34.8*  --  31.2*  PLT 516* 590*  --  553*  HEPARINUNFRC 0.33 0.44  --  0.36  CREATININE 0.80 1.09*  --  1.38*  TROPONINIHS  --   --  21*  --    Estimated Creatinine Clearance: 49 mL/min (A) (by C-G formula based on SCr of 1.38 mg/dL (H)).  Medical History: Past Medical History:  Diagnosis Date   Arthritis    Asthma    Cataract of both eyes    Complication of anesthesia    woke up during surgery x1   COPD (chronic obstructive pulmonary disease) (HCC)    Coronary artery disease    DDD (degenerative disc disease), lumbar    Diabetes mellitus without complication (HCC)    Dyspnea    DUE TO COPD   Hernia of abdominal wall    History of hiatal hernia    small   Hyperlipidemia    Hypertension    Hypothyroidism    hashimotos throiditis  -thyroidectomy  2000   Occasional tremors    Oxygen desaturation    NOW PT ON 3-4 LITERS [place] DAILY   Pertinent Medications: PTA apixaban, last dose unknown  Last dose heparin sq 2/15 @0522 .   Assessment: 68 year old female with a past medical history significant for COPD on home supplemental oxygen and diabetes mellitus who presented to [facility] ED on 04/24/2023 due to unresponsiveness. PMH of afib on apixaban. CHADSVASc 3. Started on CRRT 2/15. Pharmacy was consulted to dose and manage this patient's heparin.   Date Time aPTT/HL Rate/Comment  2/15 1947 45/---  1200/SUBtherapeutic 2/16 0552 72/0.81 1500/aPTT therapeutic  2/16    1336     67/--                1500/aPTT therapeutic    2/16 2115 62/--  1500/aPTT SUBtherapeutic 2/17 0616 69/0.65 1650/both levels therapeutic x  1 2/17 1352 74/0.61 1650/both levels therapeutic x 2 2/18 0415 -- / 0.62 1650/therapeutic x 3 2/19 0406 -- / 0.42 1650/therapeutic x 4 2/20 0403 -- / 0.39 1650/therapeutic x 5 2/21 0444 -- / 0.39 1650/therapeutic x 6 2/22 0253 -- / 0.39 1650/therapeutic x 7 2/23 0424 -- / 0.31 1650/therapeutic x 8 2/24 0315 -- / 0.33 1650/therapeutic x9 2/25     0638         0.44           1650/therapeutic X 10 2/26     0359         0.36           1650/therapeutic X 11   Baseline Labs:  Hgb 9.5 Plt 325 INR/PT 1.1/14.3  HL 0.67 - possibly has some false elevated due to apixaban.   Goal of Therapy:  Heparin level 0.3-0.7 units/ml aPTT 66-102 seconds Monitor platelets by anticoagulation protocol: Yes  Plan:  Continue heparin infusion at 1650 units/hr Recheck HL daily w/ AM labs while therapeutic Monitor CBC and HL daily while on heparin   Fariha Goto D 05/07/2023 4:54 AM

## 2023-05-07 NOTE — Consult Note (Addendum)
 PHARMACY CONSULT NOTE - ELECTROLYTES  Pharmacy Consult for Electrolyte Monitoring and Replacement   Recent Labs:  Height: 5' 7.99" (172.7 cm) Weight: 103.9 kg (229 lb 0.9 oz) IBW/kg (Calculated) : 63.88 Estimated Creatinine Clearance: 48.5 mL/min (A) (by C-G formula based on SCr of 1.38 mg/dL (H)). Potassium (mmol/L)  Date Value  05/07/2023 3.6  11/16/2013 3.5   Magnesium (mg/dL)  Date Value  16/12/9602 2.2  11/16/2013 1.0 (L)   Calcium (mg/dL)  Date Value  54/11/8117 9.2   Calcium, Total (mg/dL)  Date Value  14/78/2956 9.1   Albumin (g/dL)  Date Value  21/30/8657 2.9 (L)  11/16/2013 3.8   Phosphorus (mg/dL)  Date Value  84/69/6295 2.7   Sodium (mmol/L)  Date Value  05/07/2023 139  11/16/2013 133 (L)    Assessment  Sarah Norton is a 68 y.o. female presenting with respiratory failure and unresponsive. PMH significant for DM and COPD. Patient was started on CRRT 2/15 then taken off evening of 2/18. They continue to have good urine output. Nephrology has signed off. Pharmacy has been consulted to monitor and replace electrolytes while they're in the ICU.   Diet: NPO, Has OG Nutrition: Juven powder: 1 packet BID Pertinent medications: NA  Goal of Therapy: Electrolytes WNL (aim for K > 4 and Mg > 2 due to hx of afib) K = 3.6 Mg = 2.2 Phos = 2.7 Na = 139  Plan:  Give 10 mEq Kcl IV x 4 Continue to follow-up electrolytes with AM labs tomorrow  Thank you for allowing pharmacy to be a part of this patient's care.  Effie Shy, PharmD Pharmacy Resident  05/07/2023 6:43 AM

## 2023-05-08 ENCOUNTER — Inpatient Hospital Stay: Payer: 59

## 2023-05-08 LAB — GLUCOSE, CAPILLARY
Glucose-Capillary: 150 mg/dL — ABNORMAL HIGH (ref 70–99)
Glucose-Capillary: 175 mg/dL — ABNORMAL HIGH (ref 70–99)
Glucose-Capillary: 210 mg/dL — ABNORMAL HIGH (ref 70–99)
Glucose-Capillary: 243 mg/dL — ABNORMAL HIGH (ref 70–99)
Glucose-Capillary: 231 mg/dL — ABNORMAL HIGH (ref 70–99)
Glucose-Capillary: 206 mg/dL — ABNORMAL HIGH (ref 70–99)

## 2023-05-08 LAB — CBC
HCT: 28.9 % — ABNORMAL LOW (ref 36.0–46.0)
Hemoglobin: 9.8 g/dL — ABNORMAL LOW (ref 12.0–15.0)
MCH: 31.2 pg (ref 26.0–34.0)
MCHC: 33.9 g/dL (ref 30.0–36.0)
MCV: 92 fL (ref 80.0–100.0)
Platelets: 524 10*3/uL — ABNORMAL HIGH (ref 150–400)
RBC: 3.14 MIL/uL — ABNORMAL LOW (ref 3.87–5.11)
RDW: 17 % — ABNORMAL HIGH (ref 11.5–15.5)
WBC: 19.8 10*3/uL — ABNORMAL HIGH (ref 4.0–10.5)
nRBC: 0 % (ref 0.0–0.2)

## 2023-05-08 LAB — HEPARIN LEVEL (UNFRACTIONATED): Heparin Unfractionated: 0.42 [IU]/mL (ref 0.30–0.70)

## 2023-05-08 LAB — RENAL FUNCTION PANEL
Albumin: 2.7 g/dL — ABNORMAL LOW (ref 3.5–5.0)
Anion gap: 10 (ref 5–15)
BUN: 94 mg/dL — ABNORMAL HIGH (ref 8–23)
CO2: 30 mmol/L (ref 22–32)
Calcium: 9.3 mg/dL (ref 8.9–10.3)
Chloride: 94 mmol/L — ABNORMAL LOW (ref 98–111)
Creatinine, Ser: 1.65 mg/dL — ABNORMAL HIGH (ref 0.44–1.00)
GFR, Estimated: 33 mL/min — ABNORMAL LOW (ref >60)
Glucose, Bld: 196 mg/dL — ABNORMAL HIGH (ref 70–99)
Phosphorus: 3.6 mg/dL (ref 2.5–4.6)
Potassium: 3.9 mmol/L (ref 3.5–5.1)
Sodium: 134 mmol/L — ABNORMAL LOW (ref 135–145)

## 2023-05-08 LAB — MAGNESIUM: Magnesium: 2.2 mg/dL (ref 1.7–2.4)

## 2023-05-08 LAB — URIC ACID: Uric Acid, Serum: 6.4 mg/dL (ref 2.5–7.1)

## 2023-05-08 LAB — C-REACTIVE PROTEIN: CRP: 2.6 mg/dL — ABNORMAL HIGH (ref <1.0)

## 2023-05-08 MED ORDER — BISACODYL 10 MG RE SUPP
10.0000 mg | Freq: Once | RECTAL | Status: AC
  Administered 2023-05-08: 10 mg via RECTAL
  Filled 2023-05-08: qty 1

## 2023-05-08 MED ORDER — DIPHENHYDRAMINE HCL 50 MG/ML IJ SOLN
25.0000 mg | Freq: Three times a day (TID) | INTRAMUSCULAR | Status: AC
  Administered 2023-05-08 (×2): 25 mg via INTRAVENOUS
  Filled 2023-05-08 (×2): qty 1

## 2023-05-08 MED ORDER — INSULIN ASPART 100 UNIT/ML IJ SOLN
5.0000 [IU] | INTRAMUSCULAR | Status: DC
  Administered 2023-05-08 – 2023-05-11 (×9): 5 [IU] via SUBCUTANEOUS
  Filled 2023-05-08 (×9): qty 1

## 2023-05-08 MED ORDER — DEXAMETHASONE SODIUM PHOSPHATE 10 MG/ML IJ SOLN
8.0000 mg | Freq: Every day | INTRAMUSCULAR | Status: DC
  Administered 2023-05-09: 8 mg via INTRAVENOUS
  Filled 2023-05-08: qty 0.8

## 2023-05-08 MED ORDER — DEXAMETHASONE SODIUM PHOSPHATE 4 MG/ML IJ SOLN
4.0000 mg | INTRAMUSCULAR | Status: AC
  Administered 2023-05-08 (×2): 4 mg via INTRAVENOUS
  Filled 2023-05-08 (×2): qty 1

## 2023-05-08 MED ORDER — INSULIN ASPART 100 UNIT/ML IJ SOLN
3.0000 [IU] | Freq: Once | INTRAMUSCULAR | Status: AC
  Administered 2023-05-08: 3 [IU] via SUBCUTANEOUS

## 2023-05-08 MED ORDER — DEXAMETHASONE SODIUM PHOSPHATE 4 MG/ML IJ SOLN: 4.0000 mg | Freq: Every day | INTRAMUSCULAR | Status: DC

## 2023-05-08 MED ORDER — INSULIN ASPART 100 UNIT/ML IJ SOLN
4.0000 [IU] | INTRAMUSCULAR | Status: DC
Start: 2023-05-08 — End: 2023-05-08
  Administered 2023-05-08 (×2): 4 [IU] via SUBCUTANEOUS
  Filled 2023-05-08: qty 1

## 2023-05-08 NOTE — Plan of Care (Signed)
  Problem: Clinical Measurements: Goal: Ability to maintain clinical measurements within normal limits will improve Outcome: Not Progressing Goal: Will remain free from infection Outcome: Not Progressing Goal: Diagnostic test results will improve Outcome: Progressing Goal: Respiratory complications will improve Outcome: Not Progressing   Problem: Nutrition: Goal: Adequate nutrition will be maintained Outcome: Progressing   Problem: Safety: Goal: Ability to remain free from injury will improve Outcome: Progressing

## 2023-05-08 NOTE — Consult Note (Signed)
 PHARMACY CONSULT NOTE - ELECTROLYTES  Pharmacy Consult for Electrolyte Monitoring and Replacement   Recent Labs:  Height: 5' 7.99" (172.7 cm) Weight: 104.2 kg (229 lb 11.5 oz) IBW/kg (Calculated) : 63.88 Estimated Creatinine Clearance: 40.6 mL/min (A) (by C-G formula based on SCr of 1.65 mg/dL (H)). Potassium (mmol/L)  Date Value  05/08/2023 3.9  11/16/2013 3.5   Magnesium (mg/dL)  Date Value  16/12/9602 2.2  11/16/2013 1.0 (L)   Calcium (mg/dL)  Date Value  54/11/8117 9.3   Calcium, Total (mg/dL)  Date Value  14/78/2956 9.1   Albumin (g/dL)  Date Value  21/30/8657 2.7 (L)  11/16/2013 3.8   Phosphorus (mg/dL)  Date Value  84/69/6295 3.6   Sodium (mmol/L)  Date Value  05/08/2023 134 (L)  11/16/2013 133 (L)    Assessment  Sarah Norton is a 68 y.o. female presenting with respiratory failure and unresponsive. PMH significant for DM and COPD. Patient was started on CRRT 2/15 then taken off evening of 2/18. They continue to have good urine output. Nephrology has signed off. Pharmacy has been consulted to monitor and replace electrolytes while they're in the ICU.   Diet: NPO, Has OG Nutrition: Juven powder: 1 packet BID Pertinent medications: NA  Goal of Therapy: Electrolytes WNL (aim for K > 4 and Mg > 2 due to hx of afib)  Plan:  No replacement indicated at this time Continue to follow-up electrolytes with AM labs tomorrow  Thank you for allowing pharmacy to be a part of this patient's care.  Effie Shy, PharmD Pharmacy Resident  05/08/2023 7:58 AM

## 2023-05-08 NOTE — Consult Note (Signed)
 PHARMACY - ANTICOAGULATION CONSULT NOTE  Pharmacy Consult for Heparin Infusion Indication: atrial fibrillation  Patient Measurements: Height: 5' 7.99" (172.7 cm) Weight: 103.9 kg (229 lb 0.9 oz) IBW/kg (Calculated) : 63.88 Heparin Dosing Weight: 88.8 kg  Labs: Recent Labs    05/06/23 0638 05/06/23 1048 05/07/23 0359 05/08/23 0408  HGB 11.5*  --  10.4* 9.8*  HCT 34.8*  --  31.2* 28.9*  PLT 590*  --  553* 524*  HEPARINUNFRC 0.44  --  0.36 0.42  CREATININE 1.09*  --  1.38*  --   TROPONINIHS  --  21*  --   --    Estimated Creatinine Clearance: 48.5 mL/min (A) (by C-G formula based on SCr of 1.38 mg/dL (H)).  Medical History: Past Medical History:  Diagnosis Date   Arthritis    Asthma    Cataract of both eyes    Complication of anesthesia    woke up during surgery x1   COPD (chronic obstructive pulmonary disease) (HCC)    Coronary artery disease    DDD (degenerative disc disease), lumbar    Diabetes mellitus without complication (HCC)    Dyspnea    DUE TO COPD   Hernia of abdominal wall    History of hiatal hernia    small   Hyperlipidemia    Hypertension    Hypothyroidism    hashimotos throiditis  -thyroidectomy  2000   Occasional tremors    Oxygen desaturation    NOW PT ON 3-4 LITERS [place] DAILY   Pertinent Medications: PTA apixaban, last dose unknown  Last dose heparin sq 2/15 @0522 .   Assessment: 68 year old female with a past medical history significant for COPD on home supplemental oxygen and diabetes mellitus who presented to [facility] ED on 04/24/2023 due to unresponsiveness. PMH of afib on apixaban. CHADSVASc 3. Started on CRRT 2/15. Pharmacy was consulted to dose and manage this patient's heparin.   Date Time aPTT/HL Rate/Comment  2/15 1947 45/---  1200/SUBtherapeutic 2/16 0552 72/0.81 1500/aPTT therapeutic  2/16    1336     67/--                1500/aPTT therapeutic    2/16 2115 62/--  1500/aPTT SUBtherapeutic 2/17 0616 69/0.65 1650/both levels therapeutic  x 1 2/17 1352 74/0.61 1650/both levels therapeutic x 2 2/18 0415 -- / 0.62 1650/therapeutic x 3 2/19 0406 -- / 0.42 1650/therapeutic x 4 2/20 0403 -- / 0.39 1650/therapeutic x 5 2/21 0444 -- / 0.39 1650/therapeutic x 6 2/22 0253 -- / 0.39 1650/therapeutic x 7 2/23 0424 -- / 0.31 1650/therapeutic x 8 2/24 0315 -- / 0.33 1650/therapeutic x9 2/25     0638         0.44           1650/therapeutic X 10 2/26     0359         0.36           1650/therapeutic X 11  2/27     0408         0.42           1650/therapeutic X 12  Baseline Labs:  Hgb 9.5 Plt 325 INR/PT 1.1/14.3  HL 0.67 - possibly has some false elevated due to apixaban.   Goal of Therapy:  Heparin level 0.3-0.7 units/ml aPTT 66-102 seconds Monitor platelets by anticoagulation protocol: Yes  Plan:  Continue heparin infusion at 1650 units/hr Recheck HL daily w/ AM labs while therapeutic Monitor CBC and HL daily while  on heparin   Ludivina Guymon D 05/08/2023 4:36 AM

## 2023-05-08 NOTE — Progress Notes (Signed)
 NAME:  Sarah Norton, MRN:  [id number], DOB:  [birth year], LOS: 14 ADMISSION DATE:  04/24/2023, CONSULTATION DATE:  04/24/23 REFERRING MD:  Dr. Rosalia Hammers, CHIEF COMPLAINT:  Acute Respiratory Distress, hypoxia, AMS   Brief Pt Description / Synopsis:  68 y.o female admitted with Acute Metabolic Encephalopathy and Acute Hypoxic and Hypercapnic Respiratory Failure in the setting of Acute COPD Exacerbation due to Influenza A infection and questionable superimposed bacterial pneumonia, failed trial of BiPAP requiring intubation and mechanical ventilation.  Course complicated by AKI requiring initiation of CRRT.  History of Present Illness:  Sarah Norton is a 68 year old female with a past medical history significant for COPD on home supplemental oxygen and diabetes mellitus who presented to [facility] ED on 04/24/2023 due to unresponsiveness.  Upon EMS arrival she was noted to be hypoxic with O2 saturations in the 40s for which she was placed on CPAP with improvement in sats to the 80s.  Upon arrival to the ED she remained somnolent but slightly arousable, and given her hypoxia, she was transitioned to BiPAP  ED Course: Initial Vital Signs: Temperature 96.5 F axillary, pulse 87, respiratory rate 33, blood pressure 136/97, SpO2 78% Significant Labs: Sodium 134, glucose 313, BUN 59, creatinine 1.27, BNP 436, high-sensitivity troponin 139, lactic acid 1.7, WBC 33.4, D-dimer 2.13 VBG: pH 7.19/pCO2 82/pO2 39/bicarb 31.3 Positive for influenza A Urinalysis negative for UTI Imaging Chest X-ray>>IMPRESSION: *Bibasilar heterogeneous opacities, favored to represent multilobar pneumonia. Follow-up to clearing is recommended. CTa Chest>> pending currently Medications Administered: 1 L normal saline bolus, 125 mg Solu-Medrol, IV azithromycin and ceftriaxone, Levophed drip initiated Despite BiPAP patient's ABG worsened with worsening mental status.  Therefore ED provider elected to intubate.  PCCM is asked to admit for further  workup and treatment.  05/08/23- patient optimized for liberation from mechanical ventilation. She has no air leak on ETT eval for extubation.  We reviewed her care plan and refined therapy during multi disciplinary rounds.   Pertinent  Medical History   Past Medical History:  Diagnosis Date   Arthritis    Asthma    Cataract of both eyes    Complication of anesthesia    woke up during surgery x1   COPD (chronic obstructive pulmonary disease) (HCC)    Coronary artery disease    DDD (degenerative disc disease), lumbar    Diabetes mellitus without complication (HCC)    Dyspnea    DUE TO COPD   Hernia of abdominal wall    History of hiatal hernia    small   Hyperlipidemia    Hypertension    Hypothyroidism    hashimotos throiditis  -thyroidectomy  2000   Occasional tremors    Oxygen desaturation    NOW PT ON 3-4 LITERS Startex DAILY    Micro Data:  2/13: COVID/FLU/RSV PCR>> + Influenza A 2/13: Blood culture x2>> no growth 2/13: Strep pneumo urinary antigen>> negative 2/13: Legionella urinary antigen>> negative 2/13: HIV screen>>negative 2/13: MRSA PCR>> negative 2/15: Tracheal aspirate>> no growth 2/25: Repeat Tracheal aspirate>> Gram + cocci & rods 2/26: MRSA PCR>> negative  Antimicrobials:   Anti-infectives (From admission, onward)    Start     Dose/Rate Route Frequency Ordered Stop   05/07/23 1400  piperacillin-tazobactam (ZOSYN) IVPB 3.375 g        3.375 g 12.5 mL/hr over 240 Minutes Intravenous Every 8 hours 05/07/23 1114     05/06/23 1230  azithromycin (ZITHROMAX) tablet 250 mg  Status:  Discontinued  250 mg Per Tube Daily 05/06/23 1140 05/07/23 1114   04/30/23 2200  ceFEPIme (MAXIPIME) 1 g in sodium chloride 0.9 % 100 mL IVPB  Status:  Discontinued        1 g 200 mL/hr over 30 Minutes Intravenous Every 24 hours 04/29/23 1842 04/30/23 1006   04/29/23 1145  linezolid (ZYVOX) tablet 600 mg  Status:  Discontinued        600 mg Per Tube Every 12 hours 04/29/23  1052 04/30/23 1006   04/28/23 1015  vancomycin (VANCOREADY) IVPB 1250 mg/250 mL        1,250 mg 166.7 mL/hr over 90 Minutes Intravenous  Once 04/28/23 0921 04/28/23 1209   04/28/23 1000  oseltamivir (TAMIFLU) 6 MG/ML suspension 30 mg  Status:  Discontinued        30 mg Per Tube Daily 04/28/23 0737 04/28/23 0747   04/28/23 1000  oseltamivir (TAMIFLU) 6 MG/ML suspension 75 mg        75 mg Per Tube Daily 04/28/23 0747 04/29/23 1155   04/27/23 1000  oseltamivir (TAMIFLU) 6 MG/ML suspension 75 mg  Status:  Discontinued        75 mg Per Tube Daily 04/26/23 1834 04/28/23 0737   04/27/23 0815  vancomycin (VANCOCIN) IVPB 1000 mg/200 mL premix        1,000 mg 200 mL/hr over 60 Minutes Intravenous  Once 04/27/23 0724 04/27/23 0921   04/26/23 2200  ceFEPIme (MAXIPIME) 2 g in sodium chloride 0.9 % 100 mL IVPB  Status:  Discontinued        2 g 200 mL/hr over 30 Minutes Intravenous Every 12 hours 04/26/23 1834 04/29/23 1842   04/26/23 1000  oseltamivir (TAMIFLU) 6 MG/ML suspension 30 mg  Status:  Discontinued        30 mg Per Tube Daily 04/25/23 1017 04/26/23 1834   04/25/23 2000  vancomycin (VANCOREADY) IVPB 1250 mg/250 mL  Status:  Discontinued        1,250 mg 166.7 mL/hr over 90 Minutes Intravenous Every 24 hours 04/24/23 2106 04/25/23 1151   04/25/23 1300  ceFEPIme (MAXIPIME) 2 g in sodium chloride 0.9 % 100 mL IVPB  Status:  Discontinued        2 g 200 mL/hr over 30 Minutes Intravenous Every 24 hours 04/25/23 1157 04/26/23 1834   04/25/23 1200  azithromycin (ZITHROMAX) 500 mg in sodium chloride 0.9 % 250 mL IVPB        500 mg 250 mL/hr over 60 Minutes Intravenous Every 24 hours 04/24/23 1557 04/28/23 1312   04/25/23 1150  vancomycin variable dose per unstable renal function (pharmacist dosing)  Status:  Discontinued         Does not apply See admin instructions 04/25/23 1151 04/29/23 1052   04/25/23 1100  cefTRIAXone (ROCEPHIN) 2 g in sodium chloride 0.9 % 100 mL IVPB  Status:  Discontinued         2 g 200 mL/hr over 30 Minutes Intravenous Every 24 hours 04/24/23 1557 04/25/23 1021   04/24/23 2200  oseltamivir (TAMIFLU) 6 MG/ML suspension 30 mg  Status:  Discontinued        30 mg Per Tube 2 times daily 04/24/23 1559 04/25/23 1017   04/24/23 2045  vancomycin (VANCOREADY) IVPB 2000 mg/400 mL        2,000 mg 200 mL/hr over 120 Minutes Intravenous  Once 04/24/23 2034 04/25/23 0045   04/24/23 1245  cefTRIAXone (ROCEPHIN) 2 g in sodium chloride 0.9 % 100 mL IVPB  2 g 200 mL/hr over 30 Minutes Intravenous Once 04/24/23 1244 04/24/23 1407   04/24/23 1245  azithromycin (ZITHROMAX) 500 mg in sodium chloride 0.9 % 250 mL IVPB        500 mg 250 mL/hr over 60 Minutes Intravenous  Once 04/24/23 1244 04/24/23 1513       Significant Hospital Events: Including procedures, antibiotic start and stop dates in addition to other pertinent events   2/13: Presented to ED with AMS, acute respiratory distress and hypoxia.  Initially trialed on BiPAP but with worsening ABG and mental status.  ED provider intubated.  PCCM asked to admit. 04/25/2023 - Patient with improving respiratory status however with worsening wbc and procalcitonin.  04/26/2023 - HD cath placed and started on CRRT. 2/17 remains on vent, on CRRT 02/18: CRRT discontinued  02/19: On minimal vent settings.  Plan for WUA/SBT as tolerated utilizing Precedex.  With SBT, increased RR/WOB/accessory muscle use.  Adequate urine output and electrolytes acceptable, no plan for HD today. 02/20: Remains on minimal vent support, SBT as tolerated. On WUA with Precedex, she opens eyes but not following commands,  MRI Brain negative for acute intracranial abnormality. Faild SBT due to tachypnea (RR 40's) and increased WOB 02/21: No significant events noted overnight.  Afebrile, hemodynamically stable, not requiring vasopressors. Leukocytosis improving. On minimal vent support, plan for WUA and SBT as tolerated ~ failed SBT due to tachypnea (RR 40's)  and increased WOB.  Not following commands on WUA, will obtain EEG. 05/03/2023: Failed SBT due to altered mental status.   05/04/23: Tolerated SBT and was extubated to [facility] [place] 05/05/23- patient passed SLP for ice chips only thus far. Remains on HHFNC weaned to 40/40, drowsy during interview.  05/06/23- patient with GCS7 on exam this morning with labored breathing on BIPAP and tachyarrythmia. Will proceed with intubation emergently.  Family contacted husband is POA 05/07/23: Will not perform SBT today as she was re-intubated yesterday, likely needs Trach.  Tracheal aspirate with gram+ rods & cocci, low grade fever, will start empiric Zosyn and stop Azithromycin.   Objective   Blood pressure (!) 120/46, pulse 67, temperature 98.1 F (36.7 C), temperature source Axillary, resp. rate 17, height 5' 7.99" (1.727 m), weight 104.2 kg, SpO2 97%.    Vent Mode: PRVC FiO2 (%):  [40 %] 40 % Set Rate:  [16 bmp] 16 bmp Vt Set:  [440 mL] 440 mL PEEP:  [8 cmH20] 8 cmH20 Plateau Pressure:  [18 cmH20] 18 cmH20   Intake/Output Summary (Last 24 hours) at 05/08/2023 0810 Last data filed at 05/08/2023 0600 Gross per 24 hour  Intake 3258.6 ml  Output 1310 ml  Net 1948.6 ml   Filed Weights   05/05/23 0704 05/07/23 0500 05/08/23 0438  Weight: 106 kg 103.9 kg 104.2 kg    Examination: General: Acute on chronically ill appearing obese female, laying in bed, intubated and sedated, in NAD HENT: Atraumatic, normocephalic, PERRLA, neck supple, difficult to assess JVD due to body habitus   Lungs: Coarse breath sounds throughout, even, overbreathing the vent Cardiovascular: Atrial flutter and fib +, rate controlled, no murmurs, rubs, gallops Abdomen: Obese, soft, distended, hypoactive bowel sounds, no pain with palpation Extremities: Normal bulk and tone, no deformities, 1+edema to bilateral lower extremities, venous stasis discoloration bilateral lower extremities Neuro: Sedated, currently not following commands,  withdraws from pain, pupils PERRL 4 mm bilaterally brisk GU: Foley catheter in place draining yellow urine   Assessment & Plan:   #Acute Hypoxic & Hypercapnic Respiratory  Failure in the setting of AECOPD and Influenza infection,  EXTUBATED 2/23, REINTUBATED 2/25 CTa Chest on 2/13: negative for PE, AAA or dissection. Concerning for interstitial and alveolar edema vs pneumonitis, and bibasilar consolidation (R >L). -Full vent support, implement lung protective strategies -Plateau pressures less than 30 cm H20 -Wean FiO2 & PEEP as tolerated to maintain O2 sats 88 to 92% -Follow intermittent Chest X-ray & ABG as needed -Spontaneous Breathing Trials when respiratory parameters met and mental status permits -Implement VAP Bundle -Bronchodilators -ABX as above -Completed course of Tamiflu -no air leak on extubation eval 05/08/23 , steroid therapy TID x 1 d   #Shock: PRESENT ON ADISSION - SEPSIS HAS BEEN RULED OUT #Elevated Troponin in setting of demand ischemia  #Atrial Fibrillation with RVR ~ RATE CONTROLLED Echocardiogram 04/25/23: LVEF 60-65%, mild LVH, normal diastolic parameters, RV systolic function is normal, moderately elevated pulmonary artery systolic pressure, mild to moderate AS -Continuous cardiac monitoring -Maintain MAP >65 -Vasopressors as needed to maintain MAP goal -Lactic acid has normalized -HS Troponin peaked at 130 -Diuresis as BP and renal function permits -Continue Heparin gtt: dosing per pharmacy   #Severe Sepsis in setting of Influenza A infection and questionable superimposed bacterial pneumonia ~ TREATED -PRESENT ON ADMISSION  #Concern for potential VAP vs Aspiration -Monitor fever curve -Trend WBC's  -Follow cultures as above -Completed course of Cefepime and Linezolid on 2/19 -Start empiric Zosyn pending cultures & sensitivities  #Acute Kidney Injury on CKD Stage IIIa  #Mild Hyponatremia ~ RESOLVED -Monitor I&O's / urinary output -Follow BMP -Ensure  adequate renal perfusion -Avoid nephrotoxic agents as able -Replace electrolytes as indicated ~ Pharmacy following for assistance with electrolyte replacement -Nephrology has signed off  #Diabetes Mellitus -CBG's q4h; Target range of 140 to 180 -SSI -Follow ICU Hypo/Hyperglycemia protocol  #Acute Metabolic Encephalopathy, suspect CO2 Narcosis #Sedation needs in setting of mechanical ventilation -CT Head on 2/13 negative for acute intracranial process -MRI Brain on 2/20 negative for acute intracranial process -EEG 2/21 suggestive of diffuse encephalopathy, no seizures or epileptiform discharges -Treatment of metabolic derangements as outlined above -Maintain a RASS goal of 0 to -1 -Propofol as needed to maintain RASS goal -Avoid sedating medications as able -Daily wake up assessment   Best Practice (right click and "Reselect all SmartList Selections" daily)   Diet/type: NPO, tube feeds DVT prophylaxis: Heparin gtt GI prophylaxis: PPI Lines: N/A Foley:  Yes, and it is still needed Code Status:  full code Last date of multidisciplinary goals of care discussion [2/26]   Labs   CBC: Recent Labs  Lab 05/04/23 0424 05/05/23 0315 05/06/23 0638 05/07/23 0359 05/08/23 0408  WBC 16.9* 17.4* 28.8* 20.4* 19.8*  HGB 8.9* 10.5* 11.5* 10.4* 9.8*  HCT 26.7* 31.5* 34.8* 31.2* 28.9*  MCV 91.4 90.5 93.0 93.7 92.0  PLT 428* 516* 590* 553* 524*    Basic Metabolic Panel: Recent Labs  Lab 05/04/23 1134 05/05/23 0315 05/06/23 0638 05/07/23 0359 05/08/23 0408  NA 145 146* 144 139 134*  K 4.2 3.7 3.8 3.6 3.9  CL 100 101 100 98 94*  CO2 31 33* 32 31 30  GLUCOSE 204* 103* 130* 186* 196*  BUN 76* 61* 60* 68* 94*  CREATININE 1.02* 0.80 1.09* 1.38* 1.65*  CALCIUM 10.4* 10.1 9.7 9.2 9.3  MG 1.8 2.0 2.2 2.2 2.2  PHOS 3.2 3.3 5.1* 2.7 3.6   GFR: Estimated Creatinine Clearance: 40.6 mL/min (A) (by C-G formula based on SCr of 1.65 mg/dL (H)). Recent Labs  Lab 05/05/23  0315  05/06/23 0638 05/07/23 0359 05/08/23 0408  WBC 17.4* 28.8* 20.4* 19.8*    Liver Function Tests: Recent Labs  Lab 05/04/23 1134 05/05/23 0315 05/06/23 4098 05/07/23 0359 05/08/23 0408  AST 50*  --   --   --   --   ALT 92*  --   --   --   --   ALKPHOS 155*  --   --   --   --   BILITOT 1.0  --   --   --   --   PROT 7.6  --   --   --   --   ALBUMIN 3.5 3.3* 3.4* 2.9* 2.7*   No results for input(s): "LIPASE", "AMYLASE" in the last 168 hours. No results for input(s): "AMMONIA" in the last 168 hours.  ABG    Component Value Date/Time   PHART 7.4 05/06/2023 1132   PCO2ART 64 (H) 05/06/2023 1132   PO2ART 86 05/06/2023 1132   HCO3 39.6 (H) 05/06/2023 1132   ACIDBASEDEF 3.2 (H) 04/27/2023 1551   O2SAT 97.5 05/06/2023 1132     Coagulation Profile: No results for input(s): "INR", "PROTIME" in the last 168 hours.   Cardiac Enzymes: No results for input(s): "CKTOTAL", "CKMB", "CKMBINDEX", "TROPONINI" in the last 168 hours.  HbA1C: Hemoglobin A1C  Date/Time Value Ref Range Status  11/16/2013 04:11 PM 6.5 (H) 4.2 - 6.3 % Final    Comment:    The American Diabetes Association recommends that a primary goal of therapy should be <7% and that physicians should reevaluate the treatment regimen in patients with HbA1c values consistently >8%.    Hgb A1c MFr Bld  Date/Time Value Ref Range Status  04/25/2023 04:11 AM 6.5 (H) 4.8 - 5.6 % Final    Comment:    (NOTE) Pre diabetes:          5.7%-6.4%  Diabetes:              >6.4%  Glycemic control for   <7.0% adults with diabetes   05/04/2020 04:33 AM 8.0 (H) 4.8 - 5.6 % Final    Comment:    (NOTE) Pre diabetes:          5.7%-6.4%  Diabetes:              >6.4%  Glycemic control for   <7.0% adults with diabetes     CBG: Recent Labs  Lab 05/07/23 1928 05/07/23 2348 05/08/23 0204 05/08/23 0404 05/08/23 0719  GLUCAP 303* 201* 150* 175* 210*    Review of Systems:   Unable to assess due to  AMS/intubation/sedation   Past Medical History:  She,  has a past medical history of Arthritis, Asthma, Cataract of both eyes, Complication of anesthesia, COPD (chronic obstructive pulmonary disease) (HCC), Coronary artery disease, DDD (degenerative disc disease), lumbar, Diabetes mellitus without complication (HCC), Dyspnea, Hernia of abdominal wall, History of hiatal hernia, Hyperlipidemia, Hypertension, Hypothyroidism, Occasional tremors, and Oxygen desaturation.   Surgical History:   Past Surgical History:  Procedure Laterality Date   ABDOMINAL HYSTERECTOMY     CHOLECYSTECTOMY     COLONOSCOPY WITH PROPOFOL N/A 09/08/2014   Procedure: COLONOSCOPY WITH PROPOFOL;  Surgeon: Midge Minium, MD;  Location: [facility] ENDOSCOPY;  Service: Endoscopy;  Laterality: N/A;   GANGLION CYST EXCISION Right 08/15/2020   Procedure: Right index finger cyst removal;  Surgeon: Kennedy Bucker, MD;  Location: [facility] ORS;  Service: Orthopedics;  Laterality: Right;   HIATAL HERNIA REPAIR  2000   OTHER SURGICAL HISTORY  vocal cord surgery due to injury during thyroid surgery   parathyroidectomy  2000   THYROIDECTOMY     TONSILLECTOMY     TUMOR REMOVAL Right Leg     Social History:   reports that she quit smoking about 7 years ago. Her smoking use included cigarettes. She started smoking about 55 years ago. She has a 48 pack-year smoking history. She has never used smokeless tobacco. She reports that she does not drink alcohol and does not use drugs.   Family History:  Her family history includes Breast cancer in her sister; Kidney disease in her mother.   Allergies No Known Allergies   Home Medications  Prior to Admission medications   Medication Sig Start Date End Date Taking? Authorizing Provider  MOUNJARO 10 MG/0.5ML Pen Inject 10 mg into the skin once a week. 03/27/23  Yes [provider]  albuterol (VENTOLIN HFA) 108 (90 Base) MCG/ACT inhaler Inhale 2 puffs into the lungs every 4 (four) hours as  needed for wheezing or shortness of breath.    [provider]  allopurinol (ZYLOPRIM) 100 MG tablet Take 100 mg by mouth every morning.    [provider]  amLODipine (NORVASC) 5 MG tablet Take 5 mg by mouth every morning. 07/23/20   [provider]  atorvastatin (LIPITOR) 40 MG tablet Take 40 mg by mouth every morning.    [provider]  benazepril (LOTENSIN) 40 MG tablet Take 40 mg by mouth every morning.    [provider]  chlorhexidine (PERIDEX) 0.12 % solution Use as directed 15 mLs in the mouth or throat as needed.    [provider]  ELIQUIS 5 MG TABS tablet Take 5 mg by mouth 2 (two) times daily.    [provider]  fluticasone (FLONASE) 50 MCG/ACT nasal spray Place 2 sprays into both nostrils daily as needed for allergies. 03/31/20   [provider]  fluticasone-salmeterol (ADVAIR HFA) 119-14 MCG/ACT inhaler Inhale 2 puffs by mouth twice daily 07/07/21   Erin Fulling, MD  furosemide (LASIX) 80 MG tablet Take 80 mg by mouth every morning.    [provider]  gabapentin (NEURONTIN) 600 MG tablet Take 600 mg by mouth 3 (three) times daily.    [provider]  hydrochlorothiazide (HYDRODIURIL) 25 MG tablet Take 25 mg by mouth every morning.    [provider]  HYDROcodone-acetaminophen (NORCO) 5-325 MG tablet Take 1 tablet by mouth every 6 (six) hours as needed for moderate pain. 08/15/20   Kennedy Bucker, MD  ibuprofen (ADVIL) 800 MG tablet Take 800 mg by mouth every 8 (eight) hours as needed for pain. 07/21/20   [provider]  indomethacin (INDOCIN) 25 MG capsule Take 25 mg by mouth daily.    [provider]  LANTUS SOLOSTAR 100 UNIT/ML Solostar Pen Inject 80 Units into the skin every morning. 05/16/20   [provider]  levothyroxine (SYNTHROID) 200 MCG tablet Take 200 mcg by mouth daily before breakfast.    [provider]  Melatonin 10 MG TABS Take 10 mg by  mouth at bedtime. Patient not taking: Reported on 08/15/2020    [provider]  methocarbamol (ROBAXIN) 500 MG tablet Take 1-2 tablets by mouth every 6 (six) hours as needed for muscle spasms. 07/18/20   [provider]  Metoprolol Tartrate 75 MG TABS Take 1 tablet by mouth 2 (two) times daily.    [provider]  montelukast (SINGULAIR) 10 MG tablet Take 10 mg by  mouth every morning.    [provider]  [facility] powder Apply 1 Application topically 2 (two) times daily.    [provider]  olopatadine (PATANOL) 0.1 % ophthalmic solution Place 1 drop into both eyes daily.    [provider]  OXYGEN Inhale 3-4 L into the lungs daily.    [provider]  promethazine (PHENERGAN) 25 MG tablet Take 25 mg by mouth every 6 (six) hours as needed for nausea or vomiting.    [provider]  psyllium (REGULOID) 0.52 g capsule Take 0.52 g by mouth 2 (two) times daily.    [provider]  Tiotropium Bromide Monohydrate (SPIRIVA RESPIMAT) 1.25 MCG/ACT AERS Inhale 2 puffs into the lungs daily for 30 doses. 10/03/20 11/02/20  Erin Fulling, MD  tiZANidine (ZANAFLEX) 4 MG tablet Take 4 mg by mouth at bedtime.    [provider]  tretinoin (RETIN-A) 0.05 % cream Apply to face qhs, wash off qam 02/18/22   Deirdre Evener, MD  TRULICITY 0.75 MG/0.5ML SOPN Inject 0.75 mg into the skin once a week. wednesday 07/08/20   [provider]  VALERIAN ROOT PO Take 1,200 mg by mouth at bedtime.    [provider]     Critical care provider statement:   Total critical care time: 33 minutes   Performed by: Karna Christmas MD   Critical care time was exclusive of separately billable procedures and treating other patients.   Critical care was necessary to treat or prevent imminent or life-threatening deterioration.   Critical care was time spent personally by me on the following activities: development of treatment plan with  patient and/or surrogate as well as nursing, discussions with consultants, evaluation of patient's response to treatment, examination of patient, obtaining history from patient or surrogate, ordering and performing treatments and interventions, ordering and review of laboratory studies, ordering and review of radiographic studies, pulse oximetry and re-evaluation of patient's condition.    Vida Rigger, M.D.  Pulmonary & Critical Care Medicine

## 2023-05-08 NOTE — Inpatient Diabetes Management (Signed)
 Inpatient Diabetes Program Recommendations  AACE/ADA: New Consensus Statement on Inpatient Glycemic Control (2015)  Target Ranges:  Prepandial:   less than 140 mg/dL      Peak postprandial:   less than 180 mg/dL (1-2 hours)      Critically ill patients:  140 - 180 mg/dL   Lab Results  Component Value Date   GLUCAP 210 (H) 05/08/2023   HGBA1C 6.5 (H) 04/25/2023    Review of Glycemic Control  Latest Reference Range & Units 05/07/23 15:42 05/07/23 19:28 05/07/23 23:48 05/08/23 02:04 05/08/23 04:04 05/08/23 07:19  Glucose-Capillary 70 - 99 mg/dL 960 (H) 454 (H) 098 (H) 150 (H) 175 (H) 210 (H)  (H): Data is abnormally high Diabetes history: DM2 Outpatient Diabetes medications: Mounjaro 10 mg Qweek Current orders for Inpatient glycemic control: Semglee 15 units BID, Novolog 0-20 units Q4H; Decadron 8 mg every day Vital @ 60 ml/hr   Inpatient Diabetes Program Recommendations:     Consider ordering Novolog 4 units Q4H for tube feeding coverage. If tube feeding is stopped or held then Novolog tube feeding coverage should also be stopped or held. Secure chat sent to MD.  Thanks, Sarah Rave, MSN, RNC-OB Diabetes Coordinator [phone] (8a-5p)

## 2023-05-08 NOTE — Progress Notes (Signed)
 Pt is having periods of bradycardia and pauses. NP Cheryll Cockayne informed and wants to hold the amiodarone for now, Continue to monitor

## 2023-05-08 NOTE — Plan of Care (Signed)
  Problem: Clinical Measurements: Goal: Ability to maintain clinical measurements within normal limits will improve Outcome: Progressing   Problem: Clinical Measurements: Goal: Diagnostic test results will improve Outcome: Progressing   Problem: Clinical Measurements: Goal: Cardiovascular complication will be avoided Outcome: Progressing   

## 2023-05-09 ENCOUNTER — Inpatient Hospital Stay: Payer: 59

## 2023-05-09 DIAGNOSIS — Z515 Encounter for palliative care: Secondary | ICD-10-CM

## 2023-05-09 LAB — CULTURE, RESPIRATORY W GRAM STAIN

## 2023-05-09 LAB — RENAL FUNCTION PANEL
Albumin: 2.7 g/dL — ABNORMAL LOW (ref 3.5–5.0)
Anion gap: 11 (ref 5–15)
BUN: 82 mg/dL — ABNORMAL HIGH (ref 8–23)
CO2: 28 mmol/L (ref 22–32)
Calcium: 9.5 mg/dL (ref 8.9–10.3)
Chloride: 101 mmol/L (ref 98–111)
Creatinine, Ser: 1.37 mg/dL — ABNORMAL HIGH (ref 0.44–1.00)
GFR, Estimated: 42 mL/min — ABNORMAL LOW (ref >60)
Glucose, Bld: 194 mg/dL — ABNORMAL HIGH (ref 70–99)
Phosphorus: 3.2 mg/dL (ref 2.5–4.6)
Potassium: 3.9 mmol/L (ref 3.5–5.1)
Sodium: 140 mmol/L (ref 135–145)

## 2023-05-09 LAB — GLUCOSE, CAPILLARY
Glucose-Capillary: 128 mg/dL — ABNORMAL HIGH (ref 70–99)
Glucose-Capillary: 174 mg/dL — ABNORMAL HIGH (ref 70–99)
Glucose-Capillary: 180 mg/dL — ABNORMAL HIGH (ref 70–99)
Glucose-Capillary: 181 mg/dL — ABNORMAL HIGH (ref 70–99)
Glucose-Capillary: 190 mg/dL — ABNORMAL HIGH (ref 70–99)
Glucose-Capillary: 93 mg/dL (ref 70–99)

## 2023-05-09 LAB — CBC
HCT: 30.1 % — ABNORMAL LOW (ref 36.0–46.0)
Hemoglobin: 10.1 g/dL — ABNORMAL LOW (ref 12.0–15.0)
MCH: 30.6 pg (ref 26.0–34.0)
MCHC: 33.6 g/dL (ref 30.0–36.0)
MCV: 91.2 fL (ref 80.0–100.0)
Platelets: 438 10*3/uL — ABNORMAL HIGH (ref 150–400)
RBC: 3.3 MIL/uL — ABNORMAL LOW (ref 3.87–5.11)
RDW: 16.9 % — ABNORMAL HIGH (ref 11.5–15.5)
WBC: 11.9 10*3/uL — ABNORMAL HIGH (ref 4.0–10.5)
nRBC: 0 % (ref 0.0–0.2)

## 2023-05-09 LAB — C-REACTIVE PROTEIN: CRP: 1.2 mg/dL — ABNORMAL HIGH (ref <1.0)

## 2023-05-09 LAB — HEPARIN LEVEL (UNFRACTIONATED): Heparin Unfractionated: 0.51 [IU]/mL (ref 0.30–0.70)

## 2023-05-09 LAB — MAGNESIUM: Magnesium: 2.1 mg/dL (ref 1.7–2.4)

## 2023-05-09 MED ORDER — LEVOTHYROXINE SODIUM 50 MCG PO TABS
175.0000 ug | ORAL_TABLET | Freq: Every day | ORAL | Status: DC
  Administered 2023-05-10 – 2023-05-15 (×6): 175 ug via ORAL
  Filled 2023-05-09: qty 2
  Filled 2023-05-09 (×5): qty 1

## 2023-05-09 MED ORDER — LACTULOSE 10 GM/15ML PO SOLN: 10.0000 g | Freq: Every day | ORAL | Status: DC | PRN

## 2023-05-09 MED ORDER — ACETAMINOPHEN 325 MG PO TABS: 650.0000 mg | ORAL_TABLET | ORAL | Status: DC | PRN

## 2023-05-09 MED ORDER — DOCUSATE SODIUM 100 MG PO CAPS: 100.0000 mg | ORAL_CAPSULE | Freq: Two times a day (BID) | ORAL | Status: DC

## 2023-05-09 MED ORDER — FAMOTIDINE 20 MG PO TABS
20.0000 mg | ORAL_TABLET | Freq: Two times a day (BID) | ORAL | Status: DC
  Administered 2023-05-09 – 2023-05-15 (×12): 20 mg via ORAL
  Filled 2023-05-09 (×12): qty 1

## 2023-05-09 MED ORDER — POLYETHYLENE GLYCOL 3350 17 G PO PACK
17.0000 g | PACK | Freq: Every day | ORAL | Status: DC
Start: 2023-05-10 — End: 2023-05-10

## 2023-05-09 MED ORDER — DEXAMETHASONE SODIUM PHOSPHATE 10 MG/ML IJ SOLN
6.0000 mg | Freq: Every day | INTRAMUSCULAR | Status: AC
  Administered 2023-05-10 – 2023-05-12 (×3): 6 mg via INTRAVENOUS
  Filled 2023-05-09: qty 1
  Filled 2023-05-09: qty 0.6
  Filled 2023-05-09: qty 1

## 2023-05-09 MED ORDER — DEXAMETHASONE SODIUM PHOSPHATE 4 MG/ML IJ SOLN: 2.0000 mg | Freq: Every day | INTRAMUSCULAR | Status: DC

## 2023-05-09 MED ORDER — SENNA 8.6 MG PO TABS: 2.0000 | ORAL_TABLET | Freq: Every day | ORAL | Status: DC

## 2023-05-09 MED ORDER — DEXAMETHASONE SODIUM PHOSPHATE 4 MG/ML IJ SOLN: 4.0000 mg | Freq: Every day | INTRAMUSCULAR | Status: DC

## 2023-05-09 MED ORDER — POTASSIUM CHLORIDE 20 MEQ PO PACK
20.0000 meq | PACK | Freq: Once | ORAL | Status: DC
  Filled 2023-05-09: qty 1

## 2023-05-09 MED ORDER — IPRATROPIUM-ALBUTEROL 0.5-2.5 (3) MG/3ML IN SOLN
3.0000 mL | Freq: Three times a day (TID) | RESPIRATORY_TRACT | Status: DC
  Administered 2023-05-09 – 2023-05-12 (×8): 3 mL via RESPIRATORY_TRACT
  Filled 2023-05-09 (×8): qty 3

## 2023-05-09 NOTE — Progress Notes (Signed)
 NAME:  Sarah Norton, MRN:  [id number], DOB:  1955/10/05, LOS: 15 ADMISSION DATE:  04/24/2023, CONSULTATION DATE:  04/24/23 REFERRING MD:  Dr. Rosalia Hammers, CHIEF COMPLAINT:  Acute Respiratory Distress, hypoxia, AMS   Brief Pt Description / Synopsis:  68 y.o female admitted with Acute Metabolic Encephalopathy and Acute Hypoxic and Hypercapnic Respiratory Failure in the setting of Acute COPD Exacerbation due to Influenza A infection and questionable superimposed bacterial pneumonia, failed trial of BiPAP requiring intubation and mechanical ventilation.  Course complicated by AKI requiring initiation of CRRT.  History of Present Illness:  Sarah Norton is a 68 year old female with a past medical history significant for COPD on home supplemental oxygen and diabetes mellitus who presented to [facility] ED on 04/24/2023 due to unresponsiveness.  Upon EMS arrival she was noted to be hypoxic with O2 saturations in the 40s for which she was placed on CPAP with improvement in sats to the 80s.  Upon arrival to the ED she remained somnolent but slightly arousable, and given her hypoxia, she was transitioned to BiPAP  ED Course: Initial Vital Signs: Temperature 96.5 F axillary, pulse 87, respiratory rate 33, blood pressure 136/97, SpO2 78% Significant Labs: Sodium 134, glucose 313, BUN 59, creatinine 1.27, BNP 436, high-sensitivity troponin 139, lactic acid 1.7, WBC 33.4, D-dimer 2.13 VBG: pH 7.19/pCO2 82/pO2 39/bicarb 31.3 Positive for influenza A Urinalysis negative for UTI Imaging Chest X-ray>>IMPRESSION: *Bibasilar heterogeneous opacities, favored to represent multilobar pneumonia. Follow-up to clearing is recommended. CTa Chest>> pending currently Medications Administered: 1 L normal saline bolus, 125 mg Solu-Medrol, IV azithromycin and ceftriaxone, Levophed drip initiated Despite BiPAP patient's ABG worsened with worsening mental status.  Therefore ED provider elected to intubate.  PCCM is asked to admit for further  workup and treatment.  05/09/23- patient appears to have aspirated overnight.  She has thick discolored debris coming from ETT. She has no air leak on ETT eval for extubation.  We reviewed her care plan and refined therapy during multi disciplinary rounds.   Pertinent  Medical History   Past Medical History:  Diagnosis Date   Arthritis    Asthma    Cataract of both eyes    Complication of anesthesia    woke up during surgery x1   COPD (chronic obstructive pulmonary disease) (HCC)    Coronary artery disease    DDD (degenerative disc disease), lumbar    Diabetes mellitus without complication (HCC)    Dyspnea    DUE TO COPD   Hernia of abdominal wall    History of hiatal hernia    small   Hyperlipidemia    Hypertension    Hypothyroidism    hashimotos throiditis  -thyroidectomy  2000   Occasional tremors    Oxygen desaturation    NOW PT ON 3-4 LITERS [place] DAILY    Micro Data:  2/13: COVID/FLU/RSV PCR>> + Influenza A 2/13: Blood culture x2>> no growth 2/13: Strep pneumo urinary antigen>> negative 2/13: Legionella urinary antigen>> negative 2/13: HIV screen>>negative 2/13: MRSA PCR>> negative 2/15: Tracheal aspirate>> no growth 2/25: Repeat Tracheal aspirate>> Gram + cocci & rods 2/26: MRSA PCR>> negative  Antimicrobials:   Anti-infectives (From admission, onward)    Start     Dose/Rate Route Frequency Ordered Stop   05/07/23 1400  piperacillin-tazobactam (ZOSYN) IVPB 3.375 g        3.375 g 12.5 mL/hr over 240 Minutes Intravenous Every 8 hours 05/07/23 1114     05/06/23 1230  azithromycin (ZITHROMAX) tablet 250 mg  Status:  Discontinued        250 mg Per Tube Daily 05/06/23 1140 05/07/23 1114   04/30/23 2200  ceFEPIme (MAXIPIME) 1 g in sodium chloride 0.9 % 100 mL IVPB  Status:  Discontinued        1 g 200 mL/hr over 30 Minutes Intravenous Every 24 hours 04/29/23 1842 04/30/23 1006   04/29/23 1145  linezolid (ZYVOX) tablet 600 mg  Status:  Discontinued        600 mg Per  Tube Every 12 hours 04/29/23 1052 04/30/23 1006   04/28/23 1015  vancomycin (VANCOREADY) IVPB 1250 mg/250 mL        1,250 mg 166.7 mL/hr over 90 Minutes Intravenous  Once 04/28/23 0921 04/28/23 1209   04/28/23 1000  oseltamivir (TAMIFLU) 6 MG/ML suspension 30 mg  Status:  Discontinued        30 mg Per Tube Daily 04/28/23 0737 04/28/23 0747   04/28/23 1000  oseltamivir (TAMIFLU) 6 MG/ML suspension 75 mg        75 mg Per Tube Daily 04/28/23 0747 04/29/23 1155   04/27/23 1000  oseltamivir (TAMIFLU) 6 MG/ML suspension 75 mg  Status:  Discontinued        75 mg Per Tube Daily 04/26/23 1834 04/28/23 0737   04/27/23 0815  vancomycin (VANCOCIN) IVPB 1000 mg/200 mL premix        1,000 mg 200 mL/hr over 60 Minutes Intravenous  Once 04/27/23 0724 04/27/23 0921   04/26/23 2200  ceFEPIme (MAXIPIME) 2 g in sodium chloride 0.9 % 100 mL IVPB  Status:  Discontinued        2 g 200 mL/hr over 30 Minutes Intravenous Every 12 hours 04/26/23 1834 04/29/23 1842   04/26/23 1000  oseltamivir (TAMIFLU) 6 MG/ML suspension 30 mg  Status:  Discontinued        30 mg Per Tube Daily 04/25/23 1017 04/26/23 1834   04/25/23 2000  vancomycin (VANCOREADY) IVPB 1250 mg/250 mL  Status:  Discontinued        1,250 mg 166.7 mL/hr over 90 Minutes Intravenous Every 24 hours 04/24/23 2106 04/25/23 1151   04/25/23 1300  ceFEPIme (MAXIPIME) 2 g in sodium chloride 0.9 % 100 mL IVPB  Status:  Discontinued        2 g 200 mL/hr over 30 Minutes Intravenous Every 24 hours 04/25/23 1157 04/26/23 1834   04/25/23 1200  azithromycin (ZITHROMAX) 500 mg in sodium chloride 0.9 % 250 mL IVPB        500 mg 250 mL/hr over 60 Minutes Intravenous Every 24 hours 04/24/23 1557 04/28/23 1312   04/25/23 1150  vancomycin variable dose per unstable renal function (pharmacist dosing)  Status:  Discontinued         Does not apply See admin instructions 04/25/23 1151 04/29/23 1052   04/25/23 1100  cefTRIAXone (ROCEPHIN) 2 g in sodium chloride 0.9 % 100 mL IVPB   Status:  Discontinued        2 g 200 mL/hr over 30 Minutes Intravenous Every 24 hours 04/24/23 1557 04/25/23 1021   04/24/23 2200  oseltamivir (TAMIFLU) 6 MG/ML suspension 30 mg  Status:  Discontinued        30 mg Per Tube 2 times daily 04/24/23 1559 04/25/23 1017   04/24/23 2045  vancomycin (VANCOREADY) IVPB 2000 mg/400 mL        2,000 mg 200 mL/hr over 120 Minutes Intravenous  Once 04/24/23 2034 04/25/23 0045   04/24/23 1245  cefTRIAXone (ROCEPHIN) 2 g in  sodium chloride 0.9 % 100 mL IVPB        2 g 200 mL/hr over 30 Minutes Intravenous Once 04/24/23 1244 04/24/23 1407   04/24/23 1245  azithromycin (ZITHROMAX) 500 mg in sodium chloride 0.9 % 250 mL IVPB        500 mg 250 mL/hr over 60 Minutes Intravenous  Once 04/24/23 1244 04/24/23 1513       Significant Hospital Events: Including procedures, antibiotic start and stop dates in addition to other pertinent events   2/13: Presented to ED with AMS, acute respiratory distress and hypoxia.  Initially trialed on BiPAP but with worsening ABG and mental status.  ED provider intubated.  PCCM asked to admit. 04/25/2023 - Patient with improving respiratory status however with worsening wbc and procalcitonin.  04/26/2023 - HD cath placed and started on CRRT. 2/17 remains on vent, on CRRT 02/18: CRRT discontinued  02/19: On minimal vent settings.  Plan for WUA/SBT as tolerated utilizing Precedex.  With SBT, increased RR/WOB/accessory muscle use.  Adequate urine output and electrolytes acceptable, no plan for HD today. 02/20: Remains on minimal vent support, SBT as tolerated. On WUA with Precedex, she opens eyes but not following commands,  MRI Brain negative for acute intracranial abnormality. Faild SBT due to tachypnea (RR 40's) and increased WOB 02/21: No significant events noted overnight.  Afebrile, hemodynamically stable, not requiring vasopressors. Leukocytosis improving. On minimal vent support, plan for WUA and SBT as tolerated ~ failed SBT  due to tachypnea (RR 40's) and increased WOB.  Not following commands on WUA, will obtain EEG. 05/03/2023: Failed SBT due to altered mental status.   05/04/23: Tolerated SBT and was extubated to HF [place] 05/05/23- patient passed SLP for ice chips only thus far. Remains on HHFNC weaned to 40/40, drowsy during interview.  05/06/23- patient with GCS7 on exam this morning with labored breathing on BIPAP and tachyarrythmia. Will proceed with intubation emergently.  Family contacted husband is POA 05/07/23: Will not perform SBT today as she was re-intubated yesterday, likely needs Trach.  Tracheal aspirate with gram+ rods & cocci, low grade fever, will start empiric Zosyn and stop Azithromycin.   Objective   Blood pressure (!) 90/52, pulse 68, temperature 98.1 F (36.7 C), temperature source Oral, resp. rate 20, height 5' 7.99" (1.727 m), weight 105.1 kg, SpO2 98%.    Vent Mode: PRVC FiO2 (%):  [28 %-40 %] 30 % Set Rate:  [16 bmp] 16 bmp Vt Set:  [440 mL-450 mL] 450 mL PEEP:  [5 cmH20] 5 cmH20 Plateau Pressure:  [13 cmH20-19 cmH20] 15 cmH20   Intake/Output Summary (Last 24 hours) at 05/09/2023 1610 Last data filed at 05/09/2023 0606 Gross per 24 hour  Intake 2215.61 ml  Output 3095 ml  Net -879.39 ml   Filed Weights   05/07/23 0500 05/08/23 0438 05/09/23 0434  Weight: 103.9 kg 104.2 kg 105.1 kg    Examination: General: Acute on chronically ill appearing obese female, laying in bed, intubated and sedated, in NAD HENT: Atraumatic, normocephalic, PERRLA, neck supple, difficult to assess JVD due to body habitus   Lungs: Coarse breath sounds throughout, even, overbreathing the vent Cardiovascular: Atrial flutter and fib +, rate controlled, no murmurs, rubs, gallops Abdomen: Obese, soft, distended, hypoactive bowel sounds, no pain with palpation Extremities: Normal bulk and tone, no deformities, 1+edema to bilateral lower extremities, venous stasis discoloration bilateral lower extremities Neuro:  Sedated, currently not following commands, withdraws from pain, pupils PERRL 4 mm bilaterally brisk GU: Foley catheter  in place draining yellow urine   Assessment & Plan:       #Acute Hypoxic & Hypercapnic Respiratory Failure in the setting of AECOPD and Influenza infection,  EXTUBATED 2/23, REINTUBATED 2/25 CTa Chest on 2/13: negative for PE, AAA or dissection. Concerning for interstitial and alveolar edema vs pneumonitis, and bibasilar consolidation (R >L). -Full vent support, implement lung protective strategies -Plateau pressures less than 30 cm H20 -Wean FiO2 & PEEP as tolerated to maintain O2 sats 88 to 92% -Follow intermittent Chest X-ray & ABG as needed -Spontaneous Breathing Trials when respiratory parameters met and mental status permits -Implement VAP Bundle -Bronchodilators -ABX as above -Completed course of Tamiflu -reducing steroids  #Shock: PRESENT ON ADISSION - SEPSIS HAS BEEN RULED OUT #Elevated Troponin in setting of demand ischemia  #Atrial Fibrillation with RVR ~ RATE CONTROLLED Echocardiogram 04/25/23: LVEF 60-65%, mild LVH, normal diastolic parameters, RV systolic function is normal, moderately elevated pulmonary artery systolic pressure, mild to moderate AS -Continuous cardiac monitoring -Maintain MAP >65 -Vasopressors as needed to maintain MAP goal -Lactic acid has normalized -HS Troponin peaked at 130 -Diuresis as BP and renal function permits -Continue Heparin gtt: dosing per pharmacy   #Influenza A infection ~ TREATED -PRESENT ON ADMISSION  #Concern for potential VAP vs Aspiration -Monitor fever curve -Trend WBC's  -Follow cultures as above -Completed course of Cefepime and Linezolid on 2/19 -Start empiric Zosyn pending cultures & sensitivities  #Acute Kidney Injury on CKD Stage IIIa  #Mild Hyponatremia ~ RESOLVED -Monitor I&O's / urinary output -Follow BMP -Ensure adequate renal perfusion -Avoid nephrotoxic agents as able -Replace  electrolytes as indicated ~ Pharmacy following for assistance with electrolyte replacement -Nephrology has signed off  #Diabetes Mellitus -CBG's q4h; Target range of 140 to 180 -SSI -Follow ICU Hypo/Hyperglycemia protocol  #Acute Metabolic Encephalopathy, suspect CO2 Narcosis #Sedation needs in setting of mechanical ventilation -CT Head on 2/13 negative for acute intracranial process -MRI Brain on 2/20 negative for acute intracranial process -EEG 2/21 suggestive of diffuse encephalopathy, no seizures or epileptiform discharges -Treatment of metabolic derangements as outlined above -Maintain a RASS goal of 0 to -1 -Propofol as needed to maintain RASS goal -Avoid sedating medications as able -Daily wake up assessment   Best Practice (right click and "Reselect all SmartList Selections" daily)   Diet/type: NPO, tube feeds DVT prophylaxis: Heparin gtt GI prophylaxis: PPI Lines: N/A Foley:  Yes, and it is still needed Code Status:  full code Last date of multidisciplinary goals of care discussion [2/26]   Labs   CBC: Recent Labs  Lab 05/05/23 0315 05/06/23 0638 05/07/23 0359 05/08/23 0408 05/09/23 0350  WBC 17.4* 28.8* 20.4* 19.8* 11.9*  HGB 10.5* 11.5* 10.4* 9.8* 10.1*  HCT 31.5* 34.8* 31.2* 28.9* 30.1*  MCV 90.5 93.0 93.7 92.0 91.2  PLT 516* 590* 553* 524* 438*    Basic Metabolic Panel: Recent Labs  Lab 05/05/23 0315 05/06/23 0638 05/07/23 0359 05/08/23 0408 05/09/23 0350  NA 146* 144 139 134* 140  K 3.7 3.8 3.6 3.9 3.9  CL 101 100 98 94* 101  CO2 33* 32 31 30 28   GLUCOSE 103* 130* 186* 196* 194*  BUN 61* 60* 68* 94* 82*  CREATININE 0.80 1.09* 1.38* 1.65* 1.37*  CALCIUM 10.1 9.7 9.2 9.3 9.5  MG 2.0 2.2 2.2 2.2 2.1  PHOS 3.3 5.1* 2.7 3.6 3.2   GFR: Estimated Creatinine Clearance: 49.2 mL/min (A) (by C-G formula based on SCr of 1.37 mg/dL (H)). Recent Labs  Lab 05/06/23 [phone] 05/07/23  0359 05/08/23 0408 05/09/23 0350  WBC 28.8* 20.4* 19.8* 11.9*     Liver Function Tests: Recent Labs  Lab 05/04/23 1134 05/05/23 0315 05/06/23 1610 05/07/23 0359 05/08/23 0408 05/09/23 0350  AST 50*  --   --   --   --   --   ALT 92*  --   --   --   --   --   ALKPHOS 155*  --   --   --   --   --   BILITOT 1.0  --   --   --   --   --   PROT 7.6  --   --   --   --   --   ALBUMIN 3.5 3.3* 3.4* 2.9* 2.7* 2.7*   No results for input(s): "LIPASE", "AMYLASE" in the last 168 hours. No results for input(s): "AMMONIA" in the last 168 hours.  ABG    Component Value Date/Time   PHART 7.4 05/06/2023 1132   PCO2ART 64 (H) 05/06/2023 1132   PO2ART 86 05/06/2023 1132   HCO3 39.6 (H) 05/06/2023 1132   ACIDBASEDEF 3.2 (H) 04/27/2023 1551   O2SAT 97.5 05/06/2023 1132     Coagulation Profile: No results for input(s): "INR", "PROTIME" in the last 168 hours.   Cardiac Enzymes: No results for input(s): "CKTOTAL", "CKMB", "CKMBINDEX", "TROPONINI" in the last 168 hours.  HbA1C: Hemoglobin A1C  Date/Time Value Ref Range Status  11/16/2013 04:11 PM 6.5 (H) 4.2 - 6.3 % Final    Comment:    The American Diabetes Association recommends that a primary goal of therapy should be <7% and that physicians should reevaluate the treatment regimen in patients with HbA1c values consistently >8%.    Hgb A1c MFr Bld  Date/Time Value Ref Range Status  04/25/2023 04:11 AM 6.5 (H) 4.8 - 5.6 % Final    Comment:    (NOTE) Pre diabetes:          5.7%-6.4%  Diabetes:              >6.4%  Glycemic control for   <7.0% adults with diabetes   05/04/2020 04:33 AM 8.0 (H) 4.8 - 5.6 % Final    Comment:    (NOTE) Pre diabetes:          5.7%-6.4%  Diabetes:              >6.4%  Glycemic control for   <7.0% adults with diabetes     CBG: Recent Labs  Lab 05/08/23 1130 05/08/23 1552 05/08/23 1945 05/09/23 0012 05/09/23 0340  GLUCAP 243* 231* 206* 180* 174*    Review of Systems:   Unable to assess due to AMS/intubation/sedation   Past Medical History:   She,  has a past medical history of Arthritis, Asthma, Cataract of both eyes, Complication of anesthesia, COPD (chronic obstructive pulmonary disease) (HCC), Coronary artery disease, DDD (degenerative disc disease), lumbar, Diabetes mellitus without complication (HCC), Dyspnea, Hernia of abdominal wall, History of hiatal hernia, Hyperlipidemia, Hypertension, Hypothyroidism, Occasional tremors, and Oxygen desaturation.   Surgical History:   Past Surgical History:  Procedure Laterality Date   ABDOMINAL HYSTERECTOMY     CHOLECYSTECTOMY     COLONOSCOPY WITH PROPOFOL N/A 09/08/2014   Procedure: COLONOSCOPY WITH PROPOFOL;  Surgeon: Midge Minium, MD;  Location: [facility] ENDOSCOPY;  Service: Endoscopy;  Laterality: N/A;   GANGLION CYST EXCISION Right 08/15/2020   Procedure: Right index finger cyst removal;  Surgeon: Kennedy Bucker, MD;  Location: [facility] ORS;  Service: Orthopedics;  Laterality: Right;   HIATAL HERNIA REPAIR  2000   OTHER SURGICAL HISTORY     vocal cord surgery due to injury during thyroid surgery   parathyroidectomy  2000   THYROIDECTOMY     TONSILLECTOMY     TUMOR REMOVAL Right Leg     Social History:   reports that she quit smoking about 7 years ago. Her smoking use included cigarettes. She started smoking about 55 years ago. She has a 48 pack-year smoking history. She has never used smokeless tobacco. She reports that she does not drink alcohol and does not use drugs.   Family History:  Her family history includes Breast cancer in her sister; Kidney disease in her mother.   Allergies No Known Allergies   Home Medications  Prior to Admission medications   Medication Sig Start Date End Date Taking? Authorizing Provider  MOUNJARO 10 MG/0.5ML Pen Inject 10 mg into the skin once a week. 03/27/23  Yes [provider]  albuterol (VENTOLIN HFA) 108 (90 Base) MCG/ACT inhaler Inhale 2 puffs into the lungs every 4 (four) hours as needed for wheezing or shortness of breath.     [provider]  allopurinol (ZYLOPRIM) 100 MG tablet Take 100 mg by mouth every morning.    [provider]  amLODipine (NORVASC) 5 MG tablet Take 5 mg by mouth every morning. 07/23/20   [provider]  atorvastatin (LIPITOR) 40 MG tablet Take 40 mg by mouth every morning.    [provider]  benazepril (LOTENSIN) 40 MG tablet Take 40 mg by mouth every morning.    [provider]  chlorhexidine (PERIDEX) 0.12 % solution Use as directed 15 mLs in the mouth or throat as needed.    [provider]  ELIQUIS 5 MG TABS tablet Take 5 mg by mouth 2 (two) times daily.    [provider]  fluticasone (FLONASE) 50 MCG/ACT nasal spray Place 2 sprays into both nostrils daily as needed for allergies. 03/31/20   [provider]  fluticasone-salmeterol (ADVAIR HFA) 884-16 MCG/ACT inhaler Inhale 2 puffs by mouth twice daily 07/07/21   Erin Fulling, MD  furosemide (LASIX) 80 MG tablet Take 80 mg by mouth every morning.    [provider]  gabapentin (NEURONTIN) 600 MG tablet Take 600 mg by mouth 3 (three) times daily.    [provider]  hydrochlorothiazide (HYDRODIURIL) 25 MG tablet Take 25 mg by mouth every morning.    [provider]  HYDROcodone-acetaminophen (NORCO) 5-325 MG tablet Take 1 tablet by mouth every 6 (six) hours as needed for moderate pain. 08/15/20   Kennedy Bucker, MD  ibuprofen (ADVIL) 800 MG tablet Take 800 mg by mouth every 8 (eight) hours as needed for pain. 07/21/20   [provider]  indomethacin (INDOCIN) 25 MG capsule Take 25 mg by mouth daily.    [provider]  LANTUS SOLOSTAR 100 UNIT/ML Solostar Pen Inject 80 Units into the skin every morning. 05/16/20   [provider]  levothyroxine (SYNTHROID) 200 MCG tablet Take 200 mcg by mouth daily before breakfast.    [provider]  Melatonin 10 MG TABS Take 10 mg by mouth at bedtime. Patient not taking: Reported on  08/15/2020    [provider]  methocarbamol (ROBAXIN) 500 MG tablet Take 1-2 tablets by mouth every 6 (six) hours as needed for muscle spasms. 07/18/20   [provider]  Metoprolol Tartrate 75 MG TABS Take 1 tablet by mouth 2 (two)  times daily.    [provider]  montelukast (SINGULAIR) 10 MG tablet Take 10 mg by mouth every morning.    [provider]  [facility] powder Apply 1 Application topically 2 (two) times daily.    [provider]  olopatadine (PATANOL) 0.1 % ophthalmic solution Place 1 drop into both eyes daily.    [provider]  OXYGEN Inhale 3-4 L into the lungs daily.    [provider]  promethazine (PHENERGAN) 25 MG tablet Take 25 mg by mouth every 6 (six) hours as needed for nausea or vomiting.    [provider]  psyllium (REGULOID) 0.52 g capsule Take 0.52 g by mouth 2 (two) times daily.    [provider]  Tiotropium Bromide Monohydrate (SPIRIVA RESPIMAT) 1.25 MCG/ACT AERS Inhale 2 puffs into the lungs daily for 30 doses. 10/03/20 11/02/20  Erin Fulling, MD  tiZANidine (ZANAFLEX) 4 MG tablet Take 4 mg by mouth at bedtime.    [provider]  tretinoin (RETIN-A) 0.05 % cream Apply to face qhs, wash off qam 02/18/22   Deirdre Evener, MD  TRULICITY 0.75 MG/0.5ML SOPN Inject 0.75 mg into the skin once a week. wednesday 07/08/20   [provider]  VALERIAN ROOT PO Take 1,200 mg by mouth at bedtime.    [provider]     Critical care provider statement:   Total critical care time: 33 minutes   Performed by: Karna Christmas MD   Critical care time was exclusive of separately billable procedures and treating other patients.   Critical care was necessary to treat or prevent imminent or life-threatening deterioration.   Critical care was time spent personally by me on the following activities: development of treatment plan with patient and/or surrogate as well as nursing,  discussions with consultants, evaluation of patient's response to treatment, examination of patient, obtaining history from patient or surrogate, ordering and performing treatments and interventions, ordering and review of laboratory studies, ordering and review of radiographic studies, pulse oximetry and re-evaluation of patient's condition.    Vida Rigger, M.D.  Pulmonary & Critical Care Medicine

## 2023-05-09 NOTE — Progress Notes (Signed)
 Patient extubated by RT to [place] as ordered by MD; patient tolerated well.

## 2023-05-09 NOTE — Progress Notes (Addendum)
 Patient noted to have NG output of and green in color in the last hour.  Provider notified and ordered tracheal culture.

## 2023-05-09 NOTE — Progress Notes (Signed)
 Via report patient had an episode of tube feeding coming out the mouth and feeds were put on hold.  Provider notified and orders received for KUB and CXR.

## 2023-05-09 NOTE — Progress Notes (Signed)
 pT. EXTUBATED TO 4LNC WITHOUT INCIDENT.

## 2023-05-09 NOTE — Progress Notes (Signed)
 Swallow evaluation performed on patient who passed; provider notified and received order for carb mod/heart healthy diet.  Patient refused insulin dose; provider aware.

## 2023-05-09 NOTE — Progress Notes (Signed)
 Received order to place external catheter.

## 2023-05-09 NOTE — Plan of Care (Signed)
 Continuing with plan of care.

## 2023-05-09 NOTE — Progress Notes (Signed)
 Nutrition Follow-up  DOCUMENTATION CODES:   Obesity unspecified  INTERVENTION:   If patient does not extubate:  Recommend resuming tube feeds at goal rate   Vital 1.2@60ml /hr + ProSource TF 20- Give 60ml daily via tube.   Free water flushes q4 hours   Regimen provides 1808kcal/day, 128g/day protein and 1730ml/day of free water.   Juven Fruit Punch BID via tube, each serving provides 95kcal and 2.5g of protein (amino acids glutamine and arginine)  Daily weights   NUTRITION DIAGNOSIS:   Inadequate oral intake related to inability to eat as evidenced by NPO status. -ongoing   GOAL:   Provide needs based on ASPEN/SCCM guidelines -previously met with tube feeds   MONITOR:   Vent status, Labs, Weight trends, TF tolerance, I & O's, Skin  ASSESSMENT:   68 y/o female with h/o DM, HTN, thyroid disease s/p thyroidectomy, COPD, hiatal hernia s/p repair, polycythemia, HLD, DDD, Afib, CAD and CKD III who is admitted with Flu A, AKI, sepsis, shock, PNA and COPD exacerbation.  Pt remains sedated and ventilated. Tube feeds held overnight over concerns for aspiration. OGT to LIS with output overnight. KUB today with no significant findings. Tube feeds remain on hold for possible extubation today. Recommend resuming tube feeds at goal rate if patient does not extubate; MD wants to continue to hold. Per chart, pt is down ~10lbs since admission. Pt -2.1L on her I & Os.   Medications reviewed and include: dexamethasone, colace, pepcid, insulin, synthroid, juven, miralax, Kcl, senokot, heparin, zosyn, propofol   Labs reviewed: Na 140 wnl, K 3.9 wnl, BUN 82(H), creat 1.37(H), P 3.2 wnl, Mg 2.1 wnl Wbc- 11.9(H), Hgb 10.1(L), Hct 30.1(L) Cbgs- 128, 93, 174, 180 x 24 hrs   Patient is currently intubated on ventilator support MV: 9.6 L/min Temp (24hrs), Avg:98.2 F (36.8 C), Min:98 F (36.7 C), Max:98.3 F (36.8 C)  Propofol: 9.54 ml/hr- provides 252kcal/day   MAP >82mmHg    UOP-   Diet Order:   Diet Order             Diet NPO time specified Except for: Ice Chips  Diet effective now                  EDUCATION NEEDS:   Not appropriate for education at this time  Skin:  Skin Assessment: Reviewed RN Assessment (blanchable redness anus)  Last BM:  2/28- type 6  Height:   Ht Readings from Last 1 Encounters:  05/02/23 5' 7.99" (1.727 m)    Weight:   Wt Readings from Last 1 Encounters:  05/09/23 105.1 kg    Ideal Body Weight:  63.6 kg  BMI:  Body mass index is 35.24 kg/m.  Estimated Nutritional Needs:   Kcal:  1800-2100kcal/day  Protein:  >125g/day  Fluid:  1.7-1.9L/day  Betsey Holiday MS, RD, LDN If unable to be reached, please send secure chat to "RD inpatient" available from 8:00a-4:00p daily

## 2023-05-09 NOTE — Consult Note (Signed)
 Consultation Note Date: 05/09/2023   Patient Name: Sarah Norton  DOB: [birth year]  MRN: [id number]  Age / Sex: 68 y.o., female  PCP: Sarah Morgan, MD Referring Physician: Vida Rigger, MD  Reason for Consultation: Establishing goals of care   HPI/Brief Hospital Course: 68 y.o. female  with past medical history of COPD with chronic respiratory failure on supplemental oxygen at baseline, CAD, T2DM, HLD, HTN and hypothyroidism admitted from home on 04/24/2023 with unresponsiveness and AMS. Found to be hypoxic on EMS arrival. Placed on bipap in ED--failed and required intubation.  Found to have influenza A with possible bacterial PNA, AKI requiring CRRT 2/15-2/18 renal function has returned, failed multiple SBT/WUA 2/19-2/22, extubated 2/23 but required emergent reintubation 2/25   Palliative medicine was consulted for assisting with goals of care conversations.  Subjective:  Extensive chart review has been completed prior to meeting patient including labs, vital signs, imaging, progress notes, orders, and available advanced directive documents from current and previous encounters.  Visited with Sarah Norton initially in AM, remains intubated and sedated, able to follow commands and nods head appropriately. No family at bedside during time of visit. Attend ICU round, plans for WUA/SBT today with plan to extubate is passes, concern for prolonged intubation and need for emergent reintubation. Plan being to proceed with trach placement if reintubation needed.  Returned to bedside early afternoon, off sedation, passed SBT, awaiting extubation, no family at bedside. Per nursing, family called and shares they will arrive to bedside later today.  Returned to bedside later in afternoon, Sarah Norton extubated. She is able to answer simple questions appropriately and able to follow commands.  Introduced myself as a Publishing rights manager as a member of the palliative care  team. Explained palliative medicine is specialized medical care for people living with serious illness. It focuses on providing relief from the symptoms and stress of a serious illness. The goal is to improve quality of life for both the patient and the family.   She is able to share with me the names of her children and long term boyfriend. She is unable to recall events that led to hospitalization and events that have occurred during hospitalization. Briefly discussed hospital course.  Sarah Norton shares she is unsure at this time who she would want to serve as surrogate Management consultant. Shared legally NOK would be her adult children since her and Sarah Norton are not legally married.  We discussed patient's current illness and what it means in the larger context of patient's on-going co-morbidities. Natural disease trajectory and expectations at EOL were discussed.   Encouraged Sarah Norton to consider her wishes if extubation fails and possible need for trach placement, encouraged family conversations regarding GOC.  I discussed importance of continued conversations with family/support persons and all members of their medical team regarding overall plan of care and treatment options ensuring decisions are in alignment with patients goals of care.  All questions/concerns addressed. Emotional support provided to patient/family/support persons. PMT will continue to follow and support patient as needed.  Objective: Primary Diagnoses: Present on Admission:  Acute respiratory failure with hypoxia and hypercarbia (HCC)   Physical Exam Constitutional:      General: She is not in acute distress.    Appearance: She is ill-appearing.  Pulmonary:     Effort: Pulmonary effort is normal. No respiratory distress.  Skin:    General: Skin is warm and dry.  Neurological:     Mental Status: She is alert.  Motor: Weakness present.     Vital Signs: BP (!) 144/81   Pulse 70   Temp 98.1 F (36.7 C) (Axillary)    Resp 14   Ht 5' 7.99" (1.727 m)   Wt 105.1 kg   SpO2 99%   BMI 35.24 kg/m  Pain Scale: 0-10 POSS *See Group Information*: 1-Acceptable,Awake and alert Pain Score: 0-No pain   IO: Intake/output summary:  Intake/Output Summary (Last 24 hours) at 05/09/2023 1757 Last data filed at 05/09/2023 1600 Gross per 24 hour  Intake 1456.67 ml  Output 4495 ml  Net -3038.33 ml    LBM: Last BM Date : 05/09/23 Baseline Weight: Weight: 125 kg Most recent weight: Weight: 105.1 kg       Assessment and Plan  SUMMARY OF RECOMMENDATIONS   Ongoing GOC needed Time for outcomes  Palliative Prophylaxis:   Bowel Regimen, Delirium Protocol and Frequent Pain Assessment  Discussed With: CCM team and nursing staff   Thank you for this consult and allowing Palliative Medicine to participate in the care of Sarah Norton. Palliative medicine will continue to follow and assist as needed.   Time Total: 75 minutes  Time spent includes: Detailed review of medical records (labs, imaging, vital signs), medically appropriate exam (mental status, respiratory, cardiac, skin), discussed with treatment team, counseling and educating patient, family and staff, documenting clinical information, medication management and coordination of care.   Signed by: Sarah Deed, DNP, AGNP-C Palliative Medicine    Please contact Palliative Medicine Team phone at [phone] for questions and concerns.  For individual provider: See Loretha Stapler

## 2023-05-09 NOTE — Consult Note (Signed)
 PHARMACY - ANTICOAGULATION CONSULT NOTE  Pharmacy Consult for Heparin Infusion Indication: atrial fibrillation  Patient Measurements: Height: 5' 7.99" (172.7 cm) Weight: 105.1 kg (231 lb 11.3 oz) IBW/kg (Calculated) : 63.88 Heparin Dosing Weight: 88.8 kg  Labs: Recent Labs    05/06/23 1048 05/07/23 0359 05/07/23 0359 05/08/23 0408 05/09/23 0350  HGB  --  10.4*   < > 9.8* 10.1*  HCT  --  31.2*  --  28.9* 30.1*  PLT  --  553*  --  524* 438*  HEPARINUNFRC  --  0.36  --  0.42 0.51  CREATININE  --  1.38*  --  1.65* 1.37*  TROPONINIHS 21*  --   --   --   --    < > = values in this interval not displayed.   Estimated Creatinine Clearance: 49.2 mL/min (A) (by C-G formula based on SCr of 1.37 mg/dL (H)).  Medical History: Past Medical History:  Diagnosis Date   Arthritis    Asthma    Cataract of both eyes    Complication of anesthesia    woke up during surgery x1   COPD (chronic obstructive pulmonary disease) (HCC)    Coronary artery disease    DDD (degenerative disc disease), lumbar    Diabetes mellitus without complication (HCC)    Dyspnea    DUE TO COPD   Hernia of abdominal wall    History of hiatal hernia    small   Hyperlipidemia    Hypertension    Hypothyroidism    hashimotos throiditis  -thyroidectomy  2000   Occasional tremors    Oxygen desaturation    NOW PT ON 3-4 LITERS [place] DAILY   Pertinent Medications: PTA apixaban, last dose unknown  Last dose heparin sq 2/15 @0522 .   Assessment: 68 year old female with a past medical history significant for COPD on home supplemental oxygen and diabetes mellitus who presented to [facility] ED on 04/24/2023 due to unresponsiveness. PMH of afib on apixaban. CHADSVASc 3. Started on CRRT 2/15. Pharmacy was consulted to dose and manage this patient's heparin.   Date Time aPTT/HL Rate/Comment  2/15 1947 45/---  1200/SUBtherapeutic 2/16 0552 72/0.81 1500/aPTT therapeutic  2/16    1336     67/--                1500/aPTT  therapeutic    2/16 2115 62/--  1500/aPTT SUBtherapeutic 2/17 0616 69/0.65 1650/both levels therapeutic x 1 2/17 1352 74/0.61 1650/both levels therapeutic x 2 2/18 0415 -- / 0.62 1650/therapeutic x 3 2/19 0406 -- / 0.42 1650/therapeutic x 4 2/20 0403 -- / 0.39 1650/therapeutic x 5 2/21 0444 -- / 0.39 1650/therapeutic x 6 2/22 0253 -- / 0.39 1650/therapeutic x 7 2/23 0424 -- / 0.31 1650/therapeutic x 8 2/24 0315 -- / 0.33 1650/therapeutic x9 2/25     0638         0.44           1650/therapeutic X 10 2/26     0359         0.36           1650/therapeutic X 11  2/27     0408         0.42           1650/therapeutic X 12 2/28     0350         0.51           1650/therapeutic X 13   Baseline Labs:  Hgb  9.5 Plt 325 INR/PT 1.1/14.3  HL 0.67 - possibly has some false elevated due to apixaban.   Goal of Therapy:  Heparin level 0.3-0.7 units/ml aPTT 66-102 seconds Monitor platelets by anticoagulation protocol: Yes  Plan:  Continue heparin infusion at 1650 units/hr Recheck HL daily w/ AM labs while therapeutic Monitor CBC and HL daily while on heparin   Dela Sweeny D 05/09/2023 6:54 AM

## 2023-05-09 NOTE — Plan of Care (Signed)
  Problem: Clinical Measurements: Goal: Ability to maintain clinical measurements within normal limits will improve Outcome: Progressing Goal: Will remain free from infection Outcome: Not Progressing Goal: Diagnostic test results will improve Outcome: Progressing Goal: Cardiovascular complication will be avoided Outcome: Progressing   Problem: Nutrition: Goal: Adequate nutrition will be maintained Outcome: Progressing   Problem: Elimination: Goal: Will not experience complications related to bowel motility Outcome: Progressing Goal: Will not experience complications related to urinary retention Outcome: Progressing

## 2023-05-09 NOTE — Consult Note (Signed)
 PHARMACY CONSULT NOTE - ELECTROLYTES  Pharmacy Consult for Electrolyte Monitoring and Replacement   Recent Labs:  Height: 5' 7.99" (172.7 cm) Weight: 105.1 kg (231 lb 11.3 oz) IBW/kg (Calculated) : 63.88 Estimated Creatinine Clearance: 49.2 mL/min (A) (by C-G formula based on SCr of 1.37 mg/dL (H)). Potassium (mmol/L)  Date Value  05/09/2023 3.9  11/16/2013 3.5   Magnesium (mg/dL)  Date Value  86/57/8469 2.1  11/16/2013 1.0 (L)   Calcium (mg/dL)  Date Value  62/95/2841 9.5   Calcium, Total (mg/dL)  Date Value  32/44/0102 9.1   Albumin (g/dL)  Date Value  72/53/6644 2.7 (L)  11/16/2013 3.8   Phosphorus (mg/dL)  Date Value  03/47/4259 3.2   Sodium (mmol/L)  Date Value  05/09/2023 140  11/16/2013 133 (L)   Assessment  Sarah Norton is a 68 y.o. female presenting with respiratory failure and unresponsive. PMH significant for DM and COPD. Patient was started on CRRT 2/15 then taken off evening of 2/18. They continue to have good urine output. Nephrology has signed off. Pharmacy has been consulted to monitor and replace electrolytes while they're in the ICU.   Diet: NPO, Has OG Nutrition: Juven powder: 1 packet BID Pertinent medications: NA  Goal of Therapy: Electrolytes WNL (aim for K > 4 and Mg > 2 due to hx of afib)  Plan:  Give 20 mEq Kcl x 1 per tube (K = 3.9 to days in a row)  Continue to follow-up electrolytes with AM labs tomorrow  Thank you for allowing pharmacy to be a part of this patient's care.  Effie Shy, PharmD Pharmacy Resident  05/09/2023 6:11 AM

## 2023-05-09 NOTE — TOC Progression Note (Signed)
 Transition of Care [facility]) - Progression Note    Patient Details  Name: Sarah Norton MRN: [id number] Date of Birth: Mar 07, 1956  [facility] CM/SW Contact  Margarito Liner, LCSW Phone Number: 05/09/2023, 1:01 PM  Clinical Narrative:  TOC continues to follow progress.   Expected Discharge Plan:  (TBD) Barriers to Discharge: No Barriers Identified  Expected Discharge Plan and Services   Discharge Planning Services: CM Consult   Living arrangements for the past 2 months: Skilled Nursing Facility                                       Social Determinants of Health (SDOH) Interventions SDOH Screenings   Food Insecurity: Patient Unable To Answer (04/24/2023)  Housing: Patient Unable To Answer (04/24/2023)  Transportation Needs: Patient Unable To Answer (04/24/2023)  Utilities: Patient Unable To Answer (04/24/2023)  Social Connections: Patient Unable To Answer (04/24/2023)  Tobacco Use: Medium Risk (05/07/2023)    Readmission Risk Interventions     No data to display

## 2023-05-10 ENCOUNTER — Inpatient Hospital Stay

## 2023-05-10 LAB — C-REACTIVE PROTEIN: CRP: 0.9 mg/dL (ref <1.0)

## 2023-05-10 LAB — RENAL FUNCTION PANEL
Albumin: 3 g/dL — ABNORMAL LOW (ref 3.5–5.0)
Anion gap: 13 (ref 5–15)
BUN: 59 mg/dL — ABNORMAL HIGH (ref 8–23)
CO2: 33 mmol/L — ABNORMAL HIGH (ref 22–32)
Calcium: 9.8 mg/dL (ref 8.9–10.3)
Chloride: 97 mmol/L — ABNORMAL LOW (ref 98–111)
Creatinine, Ser: 1.06 mg/dL — ABNORMAL HIGH (ref 0.44–1.00)
GFR, Estimated: 57 mL/min — ABNORMAL LOW (ref >60)
Glucose, Bld: 83 mg/dL (ref 70–99)
Phosphorus: 2.5 mg/dL (ref 2.5–4.6)
Potassium: 2.9 mmol/L — ABNORMAL LOW (ref 3.5–5.1)
Sodium: 143 mmol/L (ref 135–145)

## 2023-05-10 LAB — GLUCOSE, CAPILLARY
Glucose-Capillary: 106 mg/dL — ABNORMAL HIGH (ref 70–99)
Glucose-Capillary: 108 mg/dL — ABNORMAL HIGH (ref 70–99)
Glucose-Capillary: 124 mg/dL — ABNORMAL HIGH (ref 70–99)
Glucose-Capillary: 157 mg/dL — ABNORMAL HIGH (ref 70–99)
Glucose-Capillary: 203 mg/dL — ABNORMAL HIGH (ref 70–99)
Glucose-Capillary: 71 mg/dL (ref 70–99)

## 2023-05-10 LAB — MAGNESIUM: Magnesium: 2.1 mg/dL (ref 1.7–2.4)

## 2023-05-10 LAB — URINALYSIS, COMPLETE (UACMP) WITH MICROSCOPIC
Bacteria, UA: NONE SEEN
Bilirubin Urine: NEGATIVE
Glucose, UA: NEGATIVE mg/dL
Hgb urine dipstick: NEGATIVE
Ketones, ur: NEGATIVE mg/dL
Leukocytes,Ua: NEGATIVE
Nitrite: NEGATIVE
Protein, ur: 100 mg/dL — AB
Specific Gravity, Urine: 1.021 (ref 1.005–1.030)
pH: 7 (ref 5.0–8.0)

## 2023-05-10 LAB — CBC
HCT: 33 % — ABNORMAL LOW (ref 36.0–46.0)
Hemoglobin: 11.1 g/dL — ABNORMAL LOW (ref 12.0–15.0)
MCH: 30.7 pg (ref 26.0–34.0)
MCHC: 33.6 g/dL (ref 30.0–36.0)
MCV: 91.4 fL (ref 80.0–100.0)
Platelets: 472 10*3/uL — ABNORMAL HIGH (ref 150–400)
RBC: 3.61 MIL/uL — ABNORMAL LOW (ref 3.87–5.11)
RDW: 17.1 % — ABNORMAL HIGH (ref 11.5–15.5)
WBC: 12.1 10*3/uL — ABNORMAL HIGH (ref 4.0–10.5)
nRBC: 0 % (ref 0.0–0.2)

## 2023-05-10 LAB — HEPARIN LEVEL (UNFRACTIONATED): Heparin Unfractionated: 0.51 [IU]/mL (ref 0.30–0.70)

## 2023-05-10 MED ORDER — POTASSIUM CHLORIDE 20 MEQ PO PACK: 40.0000 meq | PACK | Freq: Two times a day (BID) | ORAL | Status: DC

## 2023-05-10 MED ORDER — APIXABAN 5 MG PO TABS
5.0000 mg | ORAL_TABLET | Freq: Two times a day (BID) | ORAL | Status: DC
  Administered 2023-05-10 – 2023-05-15 (×11): 5 mg via ORAL
  Filled 2023-05-10 (×11): qty 1

## 2023-05-10 MED ORDER — POTASSIUM CHLORIDE 10 MEQ/100ML IV SOLN
10.0000 meq | INTRAVENOUS | Status: AC
  Administered 2023-05-10 (×2): 10 meq via INTRAVENOUS
  Filled 2023-05-10 (×2): qty 100

## 2023-05-10 MED ORDER — POTASSIUM CHLORIDE 10 MEQ/50ML IV SOLN
10.0000 meq | INTRAVENOUS | Status: DC
  Filled 2023-05-10 (×6): qty 50

## 2023-05-10 MED ORDER — AMLODIPINE BESYLATE 5 MG PO TABS
5.0000 mg | ORAL_TABLET | Freq: Every day | ORAL | Status: DC
  Administered 2023-05-10 – 2023-05-15 (×6): 5 mg via ORAL
  Filled 2023-05-10 (×6): qty 1

## 2023-05-10 MED ORDER — POTASSIUM CHLORIDE 20 MEQ PO PACK
40.0000 meq | PACK | Freq: Once | ORAL | Status: AC
  Administered 2023-05-10: 40 meq via ORAL
  Filled 2023-05-10: qty 2

## 2023-05-10 MED ORDER — MORPHINE SULFATE (PF) 2 MG/ML IV SOLN
1.0000 mg | INTRAVENOUS | Status: DC | PRN
  Administered 2023-05-10 (×2): 1 mg via INTRAVENOUS
  Filled 2023-05-10 (×2): qty 1

## 2023-05-10 NOTE — Evaluation (Signed)
 Occupational Therapy Evaluation Patient Details Name: Sarah Norton MRN: [id number] DOB: 1955-12-28 Today's Date: 05/10/2023   History of Present Illness   68 y.o female admitted with Acute Metabolic Encephalopathy and Acute Hypoxic and Hypercapnic Respiratory Failure in the setting of Acute COPD Exacerbation due to Influenza A infection and questionable superimposed bacterial pneumonia, failed trial of BiPAP requiring intubation and mechanical ventilation.  Course complicated by AKI requiring initiation of CRRT, afib with RVR.     Clinical Impressions Pt seen for OT evaluation with PT co-tx to optimize safety with mobility attempts. Pt alert, noted for delayed processing throughout and intermittently had difficulty with following one step commands. Needed multimodal cues to redirect. Pt unable to provide PLOF, oriented to self only. Pt placed in chair position to assess response to upright positioning; BP 150s/80-170/90s throughout spO2 on 6L >90% and HR WFLs. Pt noted for grimacing, shifting, and discomfort throughout sitting attempt and agreed to buttocks pain but did not quantify or describe. Pt demonstrating difficulty with sequencing, processing, questionable decreased attention to L side with notable LUE weakness and impaired coordination. When asked to participate in testing with LUE pt required cues to redirect to task as she appeared to perseverate on request to take a bath. RN notified. Currently requiring MAX A for LB ADL from bed level and MOD A for supported sitting bed level ADL. Pt will benefit from additional skilled OT services to maximize return to PLOF.    If plan is discharge home, recommend the following:   Two people to help with walking and/or transfers;A lot of help with bathing/dressing/bathroom;Direct supervision/assist for medications management;Supervision due to cognitive status;Assist for transportation;Assistance with cooking/housework;Assistance with feeding;Help with  stairs or ramp for entrance;Direct supervision/assist for financial management     Functional Status Assessment   Patient has had a recent decline in their functional status and demonstrates the ability to make significant improvements in function in a reasonable and predictable amount of time.     Equipment Recommendations   Other (comment) (defer)     Recommendations for Other Services         Precautions/Restrictions   Precautions Precautions: Fall Recall of Precautions/Restrictions: Impaired Restrictions Weight Bearing Restrictions Per Provider Order: No     Mobility Bed Mobility               General bed mobility comments: placed in chair position.    Transfers                          Balance                                           ADL either performed or assessed with clinical judgement   ADL                                         General ADL Comments: Pt currently requires MAX A for LB ADL from bed level, MOD A for UB ADL from bed level , difficulty incorporating LUE into tasks.     Vision         Perception         Praxis         Pertinent Vitals/Pain Pain Assessment Pain Assessment: Faces Faces  Pain Scale: Hurts even more Pain Location: pt did say her buttocks hurt when PT asked specfically about that area Pain Descriptors / Indicators: Grimacing, Guarding, Moaning Pain Intervention(s): Limited activity within patient's tolerance, Monitored during session, Repositioned     Extremity/Trunk Assessment Upper Extremity Assessment Upper Extremity Assessment: Generalized weakness;LUE deficits/detail LUE Deficits / Details: impaired FMC, GMC, strength with testing, denies sensory deficits LUE Coordination: decreased fine motor;decreased gross motor   Lower Extremity Assessment Lower Extremity Assessment: Defer to PT evaluation;Generalized weakness (able to move BLE for SAQ in chair  position, questionable LLE weakness compared to R but unsure)       Communication Communication Factors Affecting Communication: Difficulty expressing self   Cognition Arousal: Alert Behavior During Therapy: Flat affect Cognition: No family/caregiver present to determine baseline             OT - Cognition Comments: Pt oriented to self, "cone", slow processing, questionable inattention to L side                 Following commands: Impaired Following commands impaired: Follows one step commands inconsistently     Cueing  General Comments   Cueing Techniques: Verbal cues;Gestural cues;Tactile cues;Visual cues      Exercises     Shoulder Instructions      Home Living                                          Prior Functioning/Environment Prior Level of Function : Patient poor historian/Family not available                    OT Problem List: Decreased strength;Decreased coordination;Decreased cognition;Decreased safety awareness;Impaired balance (sitting and/or standing);Decreased knowledge of use of DME or AE;Obesity;Impaired UE functional use   OT Treatment/Interventions: Self-care/ADL training;Therapeutic exercise;Therapeutic activities;Neuromuscular education;Cognitive remediation/compensation;DME and/or AE instruction;Patient/family education;Balance training      OT Goals(Current goals can be found in the care plan section)   Acute Rehab OT Goals Patient Stated Goal: get washed up OT Goal Formulation: With patient Time For Goal Achievement: 05/24/23 Potential to Achieve Goals: Fair ADL Goals Pt Will Perform Grooming: sitting;with set-up;with contact guard assist Pt Will Transfer to Toilet: with max assist;stand pivot transfer;bedside commode (LRAD) Pt Will Perform Toileting - Clothing Manipulation and hygiene: sit to/from stand;with mod assist Additional ADL Goal #1: Pt will complete seated bilat table top FM task at midline  with PRN Assist for LUE involvement, 2/2 opportunities.   OT Frequency:  Min 1X/week    Co-evaluation PT/OT/SLP Co-Evaluation/Treatment: Yes Reason for Co-Treatment: To address functional/ADL transfers PT goals addressed during session: Mobility/safety with mobility;Strengthening/ROM OT goals addressed during session: ADL's and self-care      AM-PAC OT "6 Clicks" Daily Activity     Outcome Measure Help from another person eating meals?: A Little Help from another person taking care of personal grooming?: A Little Help from another person toileting, which includes using toliet, bedpan, or urinal?: A Lot Help from another person bathing (including washing, rinsing, drying)?: A Lot Help from another person to put on and taking off regular upper body clothing?: A Lot Help from another person to put on and taking off regular lower body clothing?: A Lot 6 Click Score: 14   End of Session Equipment Utilized During Treatment: Oxygen Nurse Communication: Mobility status;Other (comment) (pt requesting bath)  Activity Tolerance: Patient tolerated treatment well Patient  left: in bed;with call bell/phone within reach;with bed alarm set  OT Visit Diagnosis: Other abnormalities of gait and mobility (R26.89);Hemiplegia and hemiparesis Hemiplegia - Right/Left: Left Hemiplegia - dominant/non-dominant: Non-Dominant Hemiplegia - caused by: Unspecified                Time: 1610-9604 OT Time Calculation (min): 19 min Charges:  OT General Charges $OT Visit: 1 Visit OT Evaluation $OT Eval Moderate Complexity: 1 Mod  Arman Filter., MPH, MS, OTR/L ascom [phone] 05/10/23, 4:30 PM

## 2023-05-10 NOTE — Consult Note (Signed)
 PHARMACY CONSULT NOTE - ELECTROLYTES  Pharmacy Consult for Electrolyte Monitoring and Replacement   Recent Labs:  Height: 5' 7.99" (172.7 cm) Weight: 98.9 kg (218 lb 0.6 oz) IBW/kg (Calculated) : 63.88 Estimated Creatinine Clearance: 61.6 mL/min (A) (by C-G formula based on SCr of 1.06 mg/dL (H)). Potassium (mmol/L)  Date Value  05/10/2023 2.9 (L)  11/16/2013 3.5   Magnesium (mg/dL)  Date Value  16/12/9602 2.1  11/16/2013 1.0 (L)   Calcium (mg/dL)  Date Value  54/11/8117 9.8   Calcium, Total (mg/dL)  Date Value  14/78/2956 9.1   Albumin (g/dL)  Date Value  21/30/8657 3.0 (L)  11/16/2013 3.8   Phosphorus (mg/dL)  Date Value  84/69/6295 2.5   Sodium (mmol/L)  Date Value  05/10/2023 143  11/16/2013 133 (L)   Assessment  Sarah Norton is a 68 y.o. female presenting with respiratory failure and unresponsive. PMH significant for DM and COPD. Patient was started on CRRT 2/15 then taken off evening of 2/18. They continue to have good urine output. Nephrology has signed off. Pharmacy has been consulted to monitor and replace electrolytes while they're in the ICU.    Goal of Therapy: Electrolytes WNL   Plan:  Give 40 mEq KCl x 1 per tube  10 mEq IV KCl x 2 per NP Continue to follow-up electrolytes with AM labs tomorrow  Thank you for allowing pharmacy to be a part of this patient's care.  Lowella Bandy, PharmD Pharmacy Resident  05/10/2023 6:58 AM

## 2023-05-10 NOTE — Progress Notes (Signed)
 Patient noted to have delayed responses during conversations.  At the beginning of conversations patient is able quickly answer questions, but as the conversations continues the patient's response to questions becomes increasingly delayed.  By the end of a conversation the patient will have a puzzled look and difficulty giving a verbal response. Provider notified and ordered MRI.  Per provider patient's heparin can be held long enough for trip and MRI procedure.

## 2023-05-10 NOTE — Consult Note (Signed)
 PHARMACY - ANTICOAGULATION CONSULT NOTE  Pharmacy Consult for Heparin Infusion Indication: atrial fibrillation  Patient Measurements: Height: 5' 7.99" (172.7 cm) Weight: 105.1 kg (231 lb 11.3 oz) IBW/kg (Calculated) : 63.88 Heparin Dosing Weight: 88.8 kg  Labs: Recent Labs    05/08/23 0408 05/09/23 0350 05/10/23 0346  HGB 9.8* 10.1* 11.1*  HCT 28.9* 30.1* 33.0*  PLT 524* 438* 472*  HEPARINUNFRC 0.42 0.51 0.51  CREATININE 1.65* 1.37* 1.06*   Estimated Creatinine Clearance: 63.6 mL/min (A) (by C-G formula based on SCr of 1.06 mg/dL (H)).  Medical History: Past Medical History:  Diagnosis Date   Arthritis    Asthma    Cataract of both eyes    Complication of anesthesia    woke up during surgery x1   COPD (chronic obstructive pulmonary disease) (HCC)    Coronary artery disease    DDD (degenerative disc disease), lumbar    Diabetes mellitus without complication (HCC)    Dyspnea    DUE TO COPD   Hernia of abdominal wall    History of hiatal hernia    small   Hyperlipidemia    Hypertension    Hypothyroidism    hashimotos throiditis  -thyroidectomy  2000   Occasional tremors    Oxygen desaturation    NOW PT ON 3-4 LITERS [place] DAILY   Pertinent Medications: PTA apixaban, last dose unknown  Last dose heparin sq 2/15 @0522 .   Assessment: 68 year old female with a past medical history significant for COPD on home supplemental oxygen and diabetes mellitus who presented to [facility] ED on 04/24/2023 due to unresponsiveness. PMH of afib on apixaban. CHADSVASc 3. Started on CRRT 2/15. Pharmacy was consulted to dose and manage this patient's heparin.   Date Time aPTT/HL Rate/Comment  2/15 1947 45/---  1200/SUBtherapeutic 2/16 0552 72/0.81 1500/aPTT therapeutic  2/16    1336     67/--                1500/aPTT therapeutic    2/16 2115 62/--  1500/aPTT SUBtherapeutic 2/17 0616 69/0.65 1650/both levels therapeutic x 1 2/17 1352 74/0.61 1650/both levels therapeutic x 2 2/18 0415 --  / 0.62 1650/therapeutic x 3 2/19 0406 -- / 0.42 1650/therapeutic x 4 2/20 0403 -- / 0.39 1650/therapeutic x 5 2/21 0444 -- / 0.39 1650/therapeutic x 6 2/22 0253 -- / 0.39 1650/therapeutic x 7 2/23 0424 -- / 0.31 1650/therapeutic x 8 2/24 0315 -- / 0.33 1650/therapeutic x9 2/25     0638         0.44           1650/therapeutic X 10 2/26     0359         0.36           1650/therapeutic X 11  2/27     0408         0.42           1650/therapeutic X 12 2/28     0350         0.51           1650/therapeutic X 13  3/01     0346         0.51           1650/therapeutic X 14   Baseline Labs:  Hgb 9.5 Plt 325 INR/PT 1.1/14.3  HL 0.67 - possibly has some false elevated due to apixaban.   Goal of Therapy:  Heparin level 0.3-0.7 units/ml aPTT 66-102 seconds Monitor platelets  by anticoagulation protocol: Yes  Plan:  Continue heparin infusion at 1650 units/hr Recheck HL daily w/ AM labs while therapeutic Monitor CBC and HL daily while on heparin   Tyrian Peart D 05/10/2023 6:04 AM

## 2023-05-10 NOTE — Plan of Care (Signed)
  Problem: Clinical Measurements: Goal: Respiratory complications will improve Outcome: Progressing Goal: Cardiovascular complication will be avoided Outcome: Progressing   Problem: Activity: Goal: Risk for activity intolerance will decrease Outcome: Progressing   Problem: Coping: Goal: Level of anxiety will decrease Outcome: Progressing   Problem: Elimination: Goal: Will not experience complications related to urinary retention Outcome: Progressing   Problem: Pain Managment: Goal: General experience of comfort will improve and/or be controlled Outcome: Progressing   Problem: Safety: Goal: Ability to remain free from injury will improve Outcome: Progressing   Problem: Metabolic: Goal: Ability to maintain appropriate glucose levels will improve Outcome: Progressing

## 2023-05-10 NOTE — Progress Notes (Signed)
 NAME:  Sarah Norton, MRN:  [id number], DOB:  April 28, 1955, LOS: 16 ADMISSION DATE:  04/24/2023, CONSULTATION DATE:  04/24/23 REFERRING MD:  Dr. Rosalia Hammers, CHIEF COMPLAINT:  Acute Respiratory Distress, hypoxia, AMS   Brief Pt Description / Synopsis:  68 y.o female admitted with Acute Metabolic Encephalopathy and Acute Hypoxic and Hypercapnic Respiratory Failure in the setting of Acute COPD Exacerbation due to Influenza A infection and questionable superimposed bacterial pneumonia, failed trial of BiPAP requiring intubation and mechanical ventilation.  Course complicated by AKI requiring initiation of CRRT.  History of Present Illness:  Sarah Norton is a 68 year old female with a past medical history significant for COPD on home supplemental oxygen and diabetes mellitus who presented to [facility] ED on 04/24/2023 due to unresponsiveness.  Upon EMS arrival she was noted to be hypoxic with O2 saturations in the 40s for which she was placed on CPAP with improvement in sats to the 80s.  Upon arrival to the ED she remained somnolent but slightly arousable, and given her hypoxia, she was transitioned to BiPAP  ED Course: Initial Vital Signs: Temperature 96.5 F axillary, pulse 87, respiratory rate 33, blood pressure 136/97, SpO2 78% Significant Labs: Sodium 134, glucose 313, BUN 59, creatinine 1.27, BNP 436, high-sensitivity troponin 139, lactic acid 1.7, WBC 33.4, D-dimer 2.13 VBG: pH 7.19/pCO2 82/pO2 39/bicarb 31.3 Positive for influenza A Urinalysis negative for UTI Imaging Chest X-ray>>IMPRESSION: *Bibasilar heterogeneous opacities, favored to represent multilobar pneumonia. Follow-up to clearing is recommended. CTa Chest>> pending currently Medications Administered: 1 L normal saline bolus, 125 mg Solu-Medrol, IV azithromycin and ceftriaxone, Levophed drip initiated Despite BiPAP patient's ABG worsened with worsening mental status.  Therefore ED provider elected to intubate.  PCCM is asked to admit for further  workup and treatment.  05/09/23- patient appears to have aspirated overnight.  She has thick discolored debris coming from ETT. She has no air leak on ETT eval for extubation.  We reviewed her care plan and refined therapy during multi disciplinary rounds.  05/10/23- patient weaned to 4L/min [place], she's more alert and is being optimized for TRH. Diet started.   Pertinent  Medical History   Past Medical History:  Diagnosis Date   Arthritis    Asthma    Cataract of both eyes    Complication of anesthesia    woke up during surgery x1   COPD (chronic obstructive pulmonary disease) (HCC)    Coronary artery disease    DDD (degenerative disc disease), lumbar    Diabetes mellitus without complication (HCC)    Dyspnea    DUE TO COPD   Hernia of abdominal wall    History of hiatal hernia    small   Hyperlipidemia    Hypertension    Hypothyroidism    hashimotos throiditis  -thyroidectomy  2000   Occasional tremors    Oxygen desaturation    NOW PT ON 3-4 LITERS [place] DAILY    Micro Data:  2/13: COVID/FLU/RSV PCR>> + Influenza A 2/13: Blood culture x2>> no growth 2/13: Strep pneumo urinary antigen>> negative 2/13: Legionella urinary antigen>> negative 2/13: HIV screen>>negative 2/13: MRSA PCR>> negative 2/15: Tracheal aspirate>> no growth 2/25: Repeat Tracheal aspirate>> Gram + cocci & rods 2/26: MRSA PCR>> negative  Antimicrobials:   Anti-infectives (From admission, onward)    Start     Dose/Rate Route Frequency Ordered Stop   05/07/23 1400  piperacillin-tazobactam (ZOSYN) IVPB 3.375 g        3.375 g 12.5 mL/hr over 240 Minutes Intravenous Every 8  hours 05/07/23 1114 05/11/23 2359   05/06/23 1230  azithromycin (ZITHROMAX) tablet 250 mg  Status:  Discontinued        250 mg Per Tube Daily 05/06/23 1140 05/07/23 1114   04/30/23 2200  ceFEPIme (MAXIPIME) 1 g in sodium chloride 0.9 % 100 mL IVPB  Status:  Discontinued        1 g 200 mL/hr over 30 Minutes Intravenous Every 24 hours  04/29/23 1842 04/30/23 1006   04/29/23 1145  linezolid (ZYVOX) tablet 600 mg  Status:  Discontinued        600 mg Per Tube Every 12 hours 04/29/23 1052 04/30/23 1006   04/28/23 1015  vancomycin (VANCOREADY) IVPB 1250 mg/250 mL        1,250 mg 166.7 mL/hr over 90 Minutes Intravenous  Once 04/28/23 0921 04/28/23 1209   04/28/23 1000  oseltamivir (TAMIFLU) 6 MG/ML suspension 30 mg  Status:  Discontinued        30 mg Per Tube Daily 04/28/23 0737 04/28/23 0747   04/28/23 1000  oseltamivir (TAMIFLU) 6 MG/ML suspension 75 mg        75 mg Per Tube Daily 04/28/23 0747 04/29/23 1155   04/27/23 1000  oseltamivir (TAMIFLU) 6 MG/ML suspension 75 mg  Status:  Discontinued        75 mg Per Tube Daily 04/26/23 1834 04/28/23 0737   04/27/23 0815  vancomycin (VANCOCIN) IVPB 1000 mg/200 mL premix        1,000 mg 200 mL/hr over 60 Minutes Intravenous  Once 04/27/23 0724 04/27/23 0921   04/26/23 2200  ceFEPIme (MAXIPIME) 2 g in sodium chloride 0.9 % 100 mL IVPB  Status:  Discontinued        2 g 200 mL/hr over 30 Minutes Intravenous Every 12 hours 04/26/23 1834 04/29/23 1842   04/26/23 1000  oseltamivir (TAMIFLU) 6 MG/ML suspension 30 mg  Status:  Discontinued        30 mg Per Tube Daily 04/25/23 1017 04/26/23 1834   04/25/23 2000  vancomycin (VANCOREADY) IVPB 1250 mg/250 mL  Status:  Discontinued        1,250 mg 166.7 mL/hr over 90 Minutes Intravenous Every 24 hours 04/24/23 2106 04/25/23 1151   04/25/23 1300  ceFEPIme (MAXIPIME) 2 g in sodium chloride 0.9 % 100 mL IVPB  Status:  Discontinued        2 g 200 mL/hr over 30 Minutes Intravenous Every 24 hours 04/25/23 1157 04/26/23 1834   04/25/23 1200  azithromycin (ZITHROMAX) 500 mg in sodium chloride 0.9 % 250 mL IVPB        500 mg 250 mL/hr over 60 Minutes Intravenous Every 24 hours 04/24/23 1557 04/28/23 1312   04/25/23 1150  vancomycin variable dose per unstable renal function (pharmacist dosing)  Status:  Discontinued         Does not apply See admin  instructions 04/25/23 1151 04/29/23 1052   04/25/23 1100  cefTRIAXone (ROCEPHIN) 2 g in sodium chloride 0.9 % 100 mL IVPB  Status:  Discontinued        2 g 200 mL/hr over 30 Minutes Intravenous Every 24 hours 04/24/23 1557 04/25/23 1021   04/24/23 2200  oseltamivir (TAMIFLU) 6 MG/ML suspension 30 mg  Status:  Discontinued        30 mg Per Tube 2 times daily 04/24/23 1559 04/25/23 1017   04/24/23 2045  vancomycin (VANCOREADY) IVPB 2000 mg/400 mL        2,000 mg 200 mL/hr over 120  Minutes Intravenous  Once 04/24/23 2034 04/25/23 0045   04/24/23 1245  cefTRIAXone (ROCEPHIN) 2 g in sodium chloride 0.9 % 100 mL IVPB        2 g 200 mL/hr over 30 Minutes Intravenous Once 04/24/23 1244 04/24/23 1407   04/24/23 1245  azithromycin (ZITHROMAX) 500 mg in sodium chloride 0.9 % 250 mL IVPB        500 mg 250 mL/hr over 60 Minutes Intravenous  Once 04/24/23 1244 04/24/23 1513       Significant Hospital Events: Including procedures, antibiotic start and stop dates in addition to other pertinent events   2/13: Presented to ED with AMS, acute respiratory distress and hypoxia.  Initially trialed on BiPAP but with worsening ABG and mental status.  ED provider intubated.  PCCM asked to admit. 04/25/2023 - Patient with improving respiratory status however with worsening wbc and procalcitonin.  04/26/2023 - HD cath placed and started on CRRT. 2/17 remains on vent, on CRRT 02/18: CRRT discontinued  02/19: On minimal vent settings.  Plan for WUA/SBT as tolerated utilizing Precedex.  With SBT, increased RR/WOB/accessory muscle use.  Adequate urine output and electrolytes acceptable, no plan for HD today. 02/20: Remains on minimal vent support, SBT as tolerated. On WUA with Precedex, she opens eyes but not following commands,  MRI Brain negative for acute intracranial abnormality. Faild SBT due to tachypnea (RR 40's) and increased WOB 02/21: No significant events noted overnight.  Afebrile, hemodynamically stable,  not requiring vasopressors. Leukocytosis improving. On minimal vent support, plan for WUA and SBT as tolerated ~ failed SBT due to tachypnea (RR 40's) and increased WOB.  Not following commands on WUA, will obtain EEG. 05/03/2023: Failed SBT due to altered mental status.   05/04/23: Tolerated SBT and was extubated to HF [place] 05/05/23- patient passed SLP for ice chips only thus far. Remains on HHFNC weaned to 40/40, drowsy during interview.  05/06/23- patient with GCS7 on exam this morning with labored breathing on BIPAP and tachyarrythmia. Will proceed with intubation emergently.  Family contacted husband is POA 05/07/23: Will not perform SBT today as she was re-intubated yesterday, likely needs Trach.  Tracheal aspirate with gram+ rods & cocci, low grade fever, will start empiric Zosyn and stop Azithromycin.   Objective   Blood pressure 103/66, pulse 69, temperature 98.1 F (36.7 C), temperature source Axillary, resp. rate 19, height 5' 7.99" (1.727 m), weight 98.9 kg, SpO2 94%.    FiO2 (%):  [30 %] 30 % PEEP:  [5 cmH20] 5 cmH20 Pressure Support:  [5 cmH20] 5 cmH20   Intake/Output Summary (Last 24 hours) at 05/10/2023 0805 Last data filed at 05/10/2023 0600 Gross per 24 hour  Intake 937.72 ml  Output 2670 ml  Net -1732.28 ml   Filed Weights   05/08/23 0438 05/09/23 0434 05/10/23 0500  Weight: 104.2 kg 105.1 kg 98.9 kg    Examination: General: Acute on chronically ill appearing obese female, laying in bed, intubated and sedated, in NAD HENT: Atraumatic, normocephalic, PERRLA, neck supple, difficult to assess JVD due to body habitus   Lungs: Coarse breath sounds throughout, even, overbreathing the vent Cardiovascular: Atrial flutter and fib +, rate controlled, no murmurs, rubs, gallops Abdomen: Obese, soft, distended, hypoactive bowel sounds, no pain with palpation Extremities: Normal bulk and tone, no deformities, 1+edema to bilateral lower extremities, venous stasis discoloration bilateral  lower extremities Neuro: Sedated, currently not following commands, withdraws from pain, pupils PERRL 4 mm bilaterally brisk GU: Foley catheter in place draining  yellow urine   Assessment & Plan:       #Acute Hypoxic & Hypercapnic Respiratory Failure in the setting of AECOPD and Influenza infection,  EXTUBATED 2/23, REINTUBATED 2/25 CTa Chest on 2/13: negative for PE, AAA or dissection. Concerning for interstitial and alveolar edema vs pneumonitis, and bibasilar consolidation (R >L). -on nasal canula  #Shock: PRESENT ON ADISSION - SEPSIS HAS BEEN RULED OUT #Elevated Troponin in setting of demand ischemia  #Atrial Fibrillation with RVR ~ RATE CONTROLLED Echocardiogram 04/25/23: LVEF 60-65%, mild LVH, normal diastolic parameters, RV systolic function is normal, moderately elevated pulmonary artery systolic pressure, mild to moderate AS -Continuous cardiac monitoring -Maintain MAP >65 -Vasopressors as needed to maintain MAP goal -Lactic acid has normalized -HS Troponin peaked at 130 -Diuresis as BP and renal function permits -Continue Heparin gtt: dosing per pharmacy   #Influenza A infection ~ TREATED -PRESENT ON ADMISSION  #Concern for potential VAP vs Aspiration -Monitor fever curve -Trend WBC's  -Follow cultures as above -Completed course of Cefepime and Linezolid on 2/19 -Start empiric Zosyn pending cultures & sensitivities  #Acute Kidney Injury on CKD Stage IIIa  #Mild Hyponatremia ~ RESOLVED -Monitor I&O's / urinary output -Follow BMP -Ensure adequate renal perfusion -Avoid nephrotoxic agents as able -Replace electrolytes as indicated ~ Pharmacy following for assistance with electrolyte replacement -Nephrology has signed off  #Diabetes Mellitus -CBG's q4h; Target range of 140 to 180 -SSI -Follow ICU Hypo/Hyperglycemia protocol  #Acute Metabolic Encephalopathy, suspect CO2 Narcosis #Sedation needs in setting of mechanical ventilation -CT Head on 2/13 negative for  acute intracranial process -MRI Brain on 2/20 negative for acute intracranial process -EEG 2/21 suggestive of diffuse encephalopathy, no seizures or epileptiform discharges -Treatment of metabolic derangements as outlined above -Maintain a RASS goal of 0 to -1 -Propofol as needed to maintain RASS goal -Avoid sedating medications as able -Daily wake up assessment   Best Practice (right click and "Reselect all SmartList Selections" daily)   Diet/type: NPO, tube feeds DVT prophylaxis: Heparin gtt GI prophylaxis: PPI Lines: N/A Foley:  Yes, and it is still needed Code Status:  full code Last date of multidisciplinary goals of care discussion [2/26]   Labs   CBC: Recent Labs  Lab 05/06/23 0638 05/07/23 0359 05/08/23 0408 05/09/23 0350 05/10/23 0346  WBC 28.8* 20.4* 19.8* 11.9* 12.1*  HGB 11.5* 10.4* 9.8* 10.1* 11.1*  HCT 34.8* 31.2* 28.9* 30.1* 33.0*  MCV 93.0 93.7 92.0 91.2 91.4  PLT 590* 553* 524* 438* 472*    Basic Metabolic Panel: Recent Labs  Lab 05/05/23 0315 05/06/23 0638 05/07/23 0359 05/08/23 0408 05/09/23 0350 05/10/23 0346  NA 146* 144 139 134* 140 143  K 3.7 3.8 3.6 3.9 3.9 2.9*  CL 101 100 98 94* 101 97*  CO2 33* 32 31 30 28  33*  GLUCOSE 103* 130* 186* 196* 194* 83  BUN 61* 60* 68* 94* 82* 59*  CREATININE 0.80 1.09* 1.38* 1.65* 1.37* 1.06*  CALCIUM 10.1 9.7 9.2 9.3 9.5 9.8  MG 2.0 2.2 2.2 2.2 2.1  --   PHOS 3.3 5.1* 2.7 3.6 3.2 2.5   GFR: Estimated Creatinine Clearance: 61.6 mL/min (A) (by C-G formula based on SCr of 1.06 mg/dL (H)). Recent Labs  Lab 05/07/23 0359 05/08/23 0408 05/09/23 0350 05/10/23 0346  WBC 20.4* 19.8* 11.9* 12.1*    Liver Function Tests: Recent Labs  Lab 05/04/23 1134 05/05/23 0315 05/06/23 1610 05/07/23 0359 05/08/23 0408 05/09/23 0350 05/10/23 0346  AST 50*  --   --   --   --   --   --  ALT 92*  --   --   --   --   --   --   ALKPHOS 155*  --   --   --   --   --   --   BILITOT 1.0  --   --   --   --   --    --   PROT 7.6  --   --   --   --   --   --   ALBUMIN 3.5   < > 3.4* 2.9* 2.7* 2.7* 3.0*   < > = values in this interval not displayed.   No results for input(s): "LIPASE", "AMYLASE" in the last 168 hours. No results for input(s): "AMMONIA" in the last 168 hours.  ABG    Component Value Date/Time   PHART 7.4 05/06/2023 1132   PCO2ART 64 (H) 05/06/2023 1132   PO2ART 86 05/06/2023 1132   HCO3 39.6 (H) 05/06/2023 1132   ACIDBASEDEF 3.2 (H) 04/27/2023 1551   O2SAT 97.5 05/06/2023 1132     Coagulation Profile: No results for input(s): "INR", "PROTIME" in the last 168 hours.   Cardiac Enzymes: No results for input(s): "CKTOTAL", "CKMB", "CKMBINDEX", "TROPONINI" in the last 168 hours.  HbA1C: Hemoglobin A1C  Date/Time Value Ref Range Status  11/16/2013 04:11 PM 6.5 (H) 4.2 - 6.3 % Final    Comment:    The American Diabetes Association recommends that a primary goal of therapy should be <7% and that physicians should reevaluate the treatment regimen in patients with HbA1c values consistently >8%.    Hgb A1c MFr Bld  Date/Time Value Ref Range Status  04/25/2023 04:11 AM 6.5 (H) 4.8 - 5.6 % Final    Comment:    (NOTE) Pre diabetes:          5.7%-6.4%  Diabetes:              >6.4%  Glycemic control for   <7.0% adults with diabetes   05/04/2020 04:33 AM 8.0 (H) 4.8 - 5.6 % Final    Comment:    (NOTE) Pre diabetes:          5.7%-6.4%  Diabetes:              >6.4%  Glycemic control for   <7.0% adults with diabetes     CBG: Recent Labs  Lab 05/09/23 1723 05/09/23 1928 05/10/23 0000 05/10/23 0419 05/10/23 0750  GLUCAP 190* 181* 108* 71 106*    Review of Systems:   Unable to assess due to AMS/intubation/sedation   Past Medical History:  She,  has a past medical history of Arthritis, Asthma, Cataract of both eyes, Complication of anesthesia, COPD (chronic obstructive pulmonary disease) (HCC), Coronary artery disease, DDD (degenerative disc disease), lumbar,  Diabetes mellitus without complication (HCC), Dyspnea, Hernia of abdominal wall, History of hiatal hernia, Hyperlipidemia, Hypertension, Hypothyroidism, Occasional tremors, and Oxygen desaturation.   Surgical History:   Past Surgical History:  Procedure Laterality Date   ABDOMINAL HYSTERECTOMY     CHOLECYSTECTOMY     COLONOSCOPY WITH PROPOFOL N/A 09/08/2014   Procedure: COLONOSCOPY WITH PROPOFOL;  Surgeon: Midge Minium, MD;  Location: [facility] ENDOSCOPY;  Service: Endoscopy;  Laterality: N/A;   GANGLION CYST EXCISION Right 08/15/2020   Procedure: Right index finger cyst removal;  Surgeon: Kennedy Bucker, MD;  Location: [facility] ORS;  Service: Orthopedics;  Laterality: Right;   HIATAL HERNIA REPAIR  2000   OTHER SURGICAL HISTORY     vocal cord surgery due to injury  during thyroid surgery   parathyroidectomy  2000   THYROIDECTOMY     TONSILLECTOMY     TUMOR REMOVAL Right Leg     Social History:   reports that she quit smoking about 7 years ago. Her smoking use included cigarettes. She started smoking about 55 years ago. She has a 48 pack-year smoking history. She has never used smokeless tobacco. She reports that she does not drink alcohol and does not use drugs.   Family History:  Her family history includes Breast cancer in her sister; Kidney disease in her mother.   Allergies No Known Allergies   Home Medications  Prior to Admission medications   Medication Sig Start Date End Date Taking? Authorizing Provider  MOUNJARO 10 MG/0.5ML Pen Inject 10 mg into the skin once a week. 03/27/23  Yes [provider]  albuterol (VENTOLIN HFA) 108 (90 Base) MCG/ACT inhaler Inhale 2 puffs into the lungs every 4 (four) hours as needed for wheezing or shortness of breath.    [provider]  allopurinol (ZYLOPRIM) 100 MG tablet Take 100 mg by mouth every morning.    [provider]  amLODipine (NORVASC) 5 MG tablet Take 5 mg by mouth every morning. 07/23/20   [provider]   atorvastatin (LIPITOR) 40 MG tablet Take 40 mg by mouth every morning.    [provider]  benazepril (LOTENSIN) 40 MG tablet Take 40 mg by mouth every morning.    [provider]  chlorhexidine (PERIDEX) 0.12 % solution Use as directed 15 mLs in the mouth or throat as needed.    [provider]  ELIQUIS 5 MG TABS tablet Take 5 mg by mouth 2 (two) times daily.    [provider]  fluticasone (FLONASE) 50 MCG/ACT nasal spray Place 2 sprays into both nostrils daily as needed for allergies. 03/31/20   [provider]  fluticasone-salmeterol (ADVAIR HFA) 102-72 MCG/ACT inhaler Inhale 2 puffs by mouth twice daily 07/07/21   Erin Fulling, MD  furosemide (LASIX) 80 MG tablet Take 80 mg by mouth every morning.    [provider]  gabapentin (NEURONTIN) 600 MG tablet Take 600 mg by mouth 3 (three) times daily.    [provider]  hydrochlorothiazide (HYDRODIURIL) 25 MG tablet Take 25 mg by mouth every morning.    [provider]  HYDROcodone-acetaminophen (NORCO) 5-325 MG tablet Take 1 tablet by mouth every 6 (six) hours as needed for moderate pain. 08/15/20   Kennedy Bucker, MD  ibuprofen (ADVIL) 800 MG tablet Take 800 mg by mouth every 8 (eight) hours as needed for pain. 07/21/20   [provider]  indomethacin (INDOCIN) 25 MG capsule Take 25 mg by mouth daily.    [provider]  LANTUS SOLOSTAR 100 UNIT/ML Solostar Pen Inject 80 Units into the skin every morning. 05/16/20   [provider]  levothyroxine (SYNTHROID) 200 MCG tablet Take 200 mcg by mouth daily before breakfast.    [provider]  Melatonin 10 MG TABS Take 10 mg by mouth at bedtime. Patient not taking: Reported on 08/15/2020    [provider]  methocarbamol (ROBAXIN) 500 MG tablet Take 1-2 tablets by mouth every 6 (six) hours as needed for muscle spasms. 07/18/20   [provider]  Metoprolol Tartrate 75 MG TABS Take 1  tablet by mouth 2 (two) times daily.    [provider]  montelukast (SINGULAIR) 10 MG tablet Take 10 mg by mouth every morning.  [provider]  [facility] powder Apply 1 Application topically 2 (two) times daily.    [provider]  olopatadine (PATANOL) 0.1 % ophthalmic solution Place 1 drop into both eyes daily.    [provider]  OXYGEN Inhale 3-4 L into the lungs daily.    [provider]  promethazine (PHENERGAN) 25 MG tablet Take 25 mg by mouth every 6 (six) hours as needed for nausea or vomiting.    [provider]  psyllium (REGULOID) 0.52 g capsule Take 0.52 g by mouth 2 (two) times daily.    [provider]  Tiotropium Bromide Monohydrate (SPIRIVA RESPIMAT) 1.25 MCG/ACT AERS Inhale 2 puffs into the lungs daily for 30 doses. 10/03/20 11/02/20  Erin Fulling, MD  tiZANidine (ZANAFLEX) 4 MG tablet Take 4 mg by mouth at bedtime.    [provider]  tretinoin (RETIN-A) 0.05 % cream Apply to face qhs, wash off qam 02/18/22   Deirdre Evener, MD  TRULICITY 0.75 MG/0.5ML SOPN Inject 0.75 mg into the skin once a week. wednesday 07/08/20   [provider]  VALERIAN ROOT PO Take 1,200 mg by mouth at bedtime.    [provider]     Critical care provider statement:   Total critical care time: 33 minutes   Performed by: Karna Christmas MD   Critical care time was exclusive of separately billable procedures and treating other patients.   Critical care was necessary to treat or prevent imminent or life-threatening deterioration.   Critical care was time spent personally by me on the following activities: development of treatment plan with patient and/or surrogate as well as nursing, discussions with consultants, evaluation of patient's response to treatment, examination of patient, obtaining history from patient or surrogate, ordering and performing treatments and interventions, ordering and review of laboratory  studies, ordering and review of radiographic studies, pulse oximetry and re-evaluation of patient's condition.    Vida Rigger, M.D.  Pulmonary & Critical Care Medicine

## 2023-05-10 NOTE — Progress Notes (Signed)
 Patient transferred to and from MRI with this nurse, transport, in hospital bed with cardiac monitoring.  Patient tolerated trip well.

## 2023-05-10 NOTE — Progress Notes (Signed)
 Patient with complaints of burning with urination, patient has been voiding and bladder scan volume revealed a volume of 0mL.  Provider notified and ordered urinalysis.

## 2023-05-10 NOTE — Progress Notes (Signed)
 Provider notified of elevated B/Ps and placing orders.

## 2023-05-10 NOTE — Plan of Care (Signed)
 Continuing with plan of care.

## 2023-05-10 NOTE — Progress Notes (Signed)
 Daily Progress Note   Patient Name: Sarah Norton       Date: 05/10/2023 DOB: 24-Apr-1955  Age: 68 y.o. MRN#: [id number] Attending Physician: Vida Rigger, MD Primary Care Physician: Emogene Morgan, MD Admit Date: 04/24/2023  Reason for Consultation/Follow-up: Establishing goals of care  HPI/Brief Hospital Review: 68 y.o. female  with past medical history of COPD with chronic respiratory failure on supplemental oxygen at baseline, CAD, T2DM, HLD, HTN and hypothyroidism admitted from home on 04/24/2023 with unresponsiveness and AMS. Found to be hypoxic on EMS arrival. Placed on bipap in ED--failed and required intubation.   Found to have influenza A with possible bacterial PNA, AKI requiring CRRT 2/15-2/18 renal function has returned, failed multiple SBT/WUA 2/19-2/22, extubated 2/23 but required emergent reintubation 2/25   Extubated again 2/28 tolerating well   Palliative medicine was consulted for assisting with goals of care conversations.   Subjective: Extensive chart review has been completed prior to meeting patient including labs, vital signs, imaging, progress notes, orders, and available advanced directive documents from current and previous encounters.    Visited with Sarah Norton at her bedside. She remains on [place], alert and awake, slow to respond but able to answer simple questions appropriately. She reports being uncomfortable in bed and complaints of feeling pressure as though she needs to urinate.  Nursing staff notified, recommended bladder scan. Assisted with having Sarah Norton dangle on side of bed. Tolerated well, oxygen saturations dropped to mid 80's but returned to low 90's with deep breathing. Returned to lying in bed after a few minutes, able to assist with transfer but  required maximum assistance.  Provided education to Sarah Norton and daughter on incentive spirometer.  Attempted to elicit goals of care with Sarah Norton and daughter-Sarah Norton.   Sarah Norton confirms that Sarah Norton and Sarah Norton are not legally married, have been together for over 10 years. We discussed NOK being Sarah Norton and her brother-Sarah Norton. Sarah Norton share she would rely on her brother as being Management consultant as she explains her anxiety would be too high to be in a place of making decisions. Again, encouraged family conversations regarding code status and goals of care.  Answered and addressed all questions and concerns. PMT to continue to follow for ongoing needs and support.  Thank you for allowing the Palliative Medicine Team to assist  in the care of this patient.  Total time:  35 minutes  Time spent includes: Detailed review of medical records (labs, imaging, vital signs), medically appropriate exam (mental status, respiratory, cardiac, skin), discussed with treatment team, counseling and educating patient, family and staff, documenting clinical information, medication management and coordination of care.  Leeanne Deed, DNP, AGNP-C Palliative Medicine   Please contact Palliative Medicine Team phone at [phone] for questions and concerns.

## 2023-05-10 NOTE — Evaluation (Signed)
 Physical Therapy Evaluation Patient Details Name: KERITH SHERLEY MRN: [id number] DOB: April 27, 1955 Today's Date: 05/10/2023  History of Present Illness  68 y.o female admitted with Acute Metabolic Encephalopathy and Acute Hypoxic and Hypercapnic Respiratory Failure in the setting of Acute COPD Exacerbation due to Influenza A infection and questionable superimposed bacterial pneumonia, failed trial of BiPAP requiring intubation and mechanical ventilation.  Course complicated by AKI requiring initiation of CRRT, afib with RVR.   Clinical Impression  Pt alert, noted for delayed processing throughout and intermittently had difficulty with following one step commands. Needed multimodal cues to redirect. Pt unable to provide PLOF, oriented to self only. Pt placed in chair position to assess response to upright positioning; BP 150s/80-170/90s throughout spO2 on 6L >90% and HR WFLs. Pt noted for grimacing, shifting, and discomfort throughout sitting attempt and agreed to buttocks pain but did not quantify or describe. Pt able to perform a few exercises with multimodal max cueing (ankle pumps, SAQ, and able to use BUE to pull on therapist to lift back off bed) but noted to potentially have some L sided weakness (LUE more evident than LLE), RN notified.  Overall the patient demonstrated deficits (see "PT Problem List") that impede the patient's functional abilities, safety, and mobility and would benefit from skilled PT intervention.          If plan is discharge home, recommend the following: Two people to help with walking and/or transfers;Two people to help with bathing/dressing/bathroom;Help with stairs or ramp for entrance;Assistance with feeding;Assist for transportation;Assistance with cooking/housework;Supervision due to cognitive status   Can travel by private vehicle   No    Equipment Recommendations Other (comment) (TBD at next venue of care)  Recommendations for Other Services       Functional  Status Assessment Patient has had a recent decline in their functional status and demonstrates the ability to make significant improvements in function in a reasonable and predictable amount of time.     Precautions / Restrictions Precautions Precautions: Fall Recall of Precautions/Restrictions: Impaired Restrictions Weight Bearing Restrictions Per Provider Order: No      Mobility  Bed Mobility               General bed mobility comments: placed in chair position.    Transfers                        Ambulation/Gait                  Stairs            Wheelchair Mobility     Tilt Bed    Modified Rankin (Stroke Patients Only)       Balance                                             Pertinent Vitals/Pain Pain Assessment Pain Assessment: Faces Faces Pain Scale: Hurts even more Pain Location: pt did say her buttocks hurt when PT asked specfically about that area Pain Descriptors / Indicators: Grimacing, Guarding, Moaning Pain Intervention(s): Limited activity within patient's tolerance, Monitored during session, Repositioned    Home Living                          Prior Function Prior Level of Function : Patient poor historian/Family not available  Extremity/Trunk Assessment   Upper Extremity Assessment Upper Extremity Assessment: Defer to OT evaluation    Lower Extremity Assessment Lower Extremity Assessment: Generalized weakness (able to move BLE for SAQ in chair position, questionable LLE weakness compared to R but unsure)       Communication        Cognition Arousal: Alert Behavior During Therapy: Flat affect                           PT - Cognition Comments: delayed processing noted Following commands: Impaired Following commands impaired: Follows one step commands inconsistently     Cueing Cueing Techniques: Verbal cues, Gestural cues, Tactile  cues, Visual cues     General Comments      Exercises General Exercises - Lower Extremity Ankle Circles/Pumps: AROM, Both, 10 reps Short Arc Quad: AROM, Strengthening, Both, 10 reps   Assessment/Plan    PT Assessment Patient needs continued PT services  PT Problem List Decreased strength;Decreased range of motion;Decreased activity tolerance;Decreased mobility;Decreased knowledge of precautions;Decreased balance;Decreased knowledge of use of DME;Decreased safety awareness       PT Treatment Interventions DME instruction;Balance training;Gait training;Neuromuscular re-education;Stair training;Functional mobility training;Patient/family education;Therapeutic activities;Therapeutic exercise    PT Goals (Current goals can be found in the Care Plan section)  Acute Rehab PT Goals PT Goal Formulation: Patient unable to participate in goal setting Time For Goal Achievement: 05/24/23 Potential to Achieve Goals: Good    Frequency Min 1X/week     Co-evaluation PT/OT/SLP Co-Evaluation/Treatment: Yes Reason for Co-Treatment: To address functional/ADL transfers PT goals addressed during session: Mobility/safety with mobility;Strengthening/ROM OT goals addressed during session: ADL's and self-care       AM-PAC PT "6 Clicks" Mobility  Outcome Measure Help needed turning from your back to your side while in a flat bed without using bedrails?: A Lot Help needed moving from lying on your back to sitting on the side of a flat bed without using bedrails?: A Lot Help needed moving to and from a bed to a chair (including a wheelchair)?: A Lot Help needed standing up from a chair using your arms (e.g., wheelchair or bedside chair)?: A Lot Help needed to walk in hospital room?: Total Help needed climbing 3-5 steps with a railing? : Total 6 Click Score: 10    End of Session   Activity Tolerance: Patient limited by pain Patient left: in bed;with call bell/phone within reach;with bed alarm  set Nurse Communication: Mobility status PT Visit Diagnosis: Other abnormalities of gait and mobility (R26.89);Difficulty in walking, not elsewhere classified (R26.2);Muscle weakness (generalized) (M62.81)    Time: 1610-9604 PT Time Calculation (min) (ACUTE ONLY): 18 min   Charges:   PT Evaluation $PT Eval Moderate Complexity: 1 Mod   PT General Charges $$ ACUTE PT VISIT: 1 Visit        Olga Coaster PT, DPT 3:58 PM,05/10/23

## 2023-05-11 LAB — RENAL FUNCTION PANEL
Albumin: 2.8 g/dL — ABNORMAL LOW (ref 3.5–5.0)
Anion gap: 10 (ref 5–15)
BUN: 46 mg/dL — ABNORMAL HIGH (ref 8–23)
CO2: 29 mmol/L (ref 22–32)
Calcium: 9.5 mg/dL (ref 8.9–10.3)
Chloride: 100 mmol/L (ref 98–111)
Creatinine, Ser: 1.05 mg/dL — ABNORMAL HIGH (ref 0.44–1.00)
GFR, Estimated: 58 mL/min — ABNORMAL LOW (ref >60)
Glucose, Bld: 85 mg/dL (ref 70–99)
Phosphorus: 2.8 mg/dL (ref 2.5–4.6)
Potassium: 3.4 mmol/L — ABNORMAL LOW (ref 3.5–5.1)
Sodium: 139 mmol/L (ref 135–145)

## 2023-05-11 LAB — MAGNESIUM: Magnesium: 2 mg/dL (ref 1.7–2.4)

## 2023-05-11 LAB — CBC
HCT: 34.3 % — ABNORMAL LOW (ref 36.0–46.0)
Hemoglobin: 11.4 g/dL — ABNORMAL LOW (ref 12.0–15.0)
MCH: 31 pg (ref 26.0–34.0)
MCHC: 33.2 g/dL (ref 30.0–36.0)
MCV: 93.2 fL (ref 80.0–100.0)
Platelets: 452 10*3/uL — ABNORMAL HIGH (ref 150–400)
RBC: 3.68 MIL/uL — ABNORMAL LOW (ref 3.87–5.11)
RDW: 16.5 % — ABNORMAL HIGH (ref 11.5–15.5)
WBC: 11.2 10*3/uL — ABNORMAL HIGH (ref 4.0–10.5)
nRBC: 0 % (ref 0.0–0.2)

## 2023-05-11 LAB — GLUCOSE, CAPILLARY
Glucose-Capillary: 100 mg/dL — ABNORMAL HIGH (ref 70–99)
Glucose-Capillary: 147 mg/dL — ABNORMAL HIGH (ref 70–99)
Glucose-Capillary: 198 mg/dL — ABNORMAL HIGH (ref 70–99)
Glucose-Capillary: 52 mg/dL — ABNORMAL LOW (ref 70–99)
Glucose-Capillary: 79 mg/dL (ref 70–99)
Glucose-Capillary: 90 mg/dL (ref 70–99)
Glucose-Capillary: 92 mg/dL (ref 70–99)

## 2023-05-11 LAB — C-REACTIVE PROTEIN: CRP: 1 mg/dL — ABNORMAL HIGH (ref <1.0)

## 2023-05-11 MED ORDER — POTASSIUM CHLORIDE CRYS ER 20 MEQ PO TBCR
40.0000 meq | EXTENDED_RELEASE_TABLET | Freq: Once | ORAL | Status: AC
  Administered 2023-05-11: 40 meq via ORAL
  Filled 2023-05-11: qty 2

## 2023-05-11 NOTE — Consult Note (Signed)
 PHARMACY CONSULT NOTE - ELECTROLYTES  Pharmacy Consult for Electrolyte Monitoring and Replacement   Recent Labs:  Height: 5' 7.99" (172.7 cm) Weight: 98.9 kg (218 lb 0.6 oz) IBW/kg (Calculated) : 63.88 Estimated Creatinine Clearance: 62.2 mL/min (A) (by C-G formula based on SCr of 1.05 mg/dL (H)). Potassium (mmol/L)  Date Value  05/11/2023 3.4 (L)  11/16/2013 3.5   Magnesium (mg/dL)  Date Value  29/56/2130 2.0  11/16/2013 1.0 (L)   Calcium (mg/dL)  Date Value  86/57/8469 9.5   Calcium, Total (mg/dL)  Date Value  62/95/2841 9.1   Albumin (g/dL)  Date Value  32/44/0102 2.8 (L)  11/16/2013 3.8   Phosphorus (mg/dL)  Date Value  72/53/6644 2.8   Sodium (mmol/L)  Date Value  05/11/2023 139  11/16/2013 133 (L)   Assessment  Sarah Norton is a 68 y.o. female presenting with respiratory failure and unresponsive. PMH significant for DM and COPD. Patient was started on CRRT 2/15 then taken off evening of 2/18. They continue to have good urine output. Nephrology has signed off. Pharmacy has been consulted to monitor and replace electrolytes while they're in the ICU.   Goal of Therapy: Electrolytes WNL   Plan:  K 3.4  Scr 1.05    Will  order KCL 40 meq po x 1 Continue to follow-up electrolytes with AM labs tomorrow  Thank you for allowing pharmacy to be a part of this patient's care.  Bari Mantis PharmD Clinical Pharmacist 05/11/2023

## 2023-05-11 NOTE — Progress Notes (Signed)
 Daily Progress Note   Patient Name: Sarah Norton       Date: 05/11/2023 DOB: [birth year]  Age: 68 y.o. MRN#: [id number] Attending Physician: Vida Rigger, MD Primary Care Physician: Emogene Morgan, MD Admit Date: 04/24/2023  Reason for Consultation/Follow-up: Establishing goals of care  HPI/Brief Hospital Review: 68 y.o. female  with past medical history of COPD with chronic respiratory failure on supplemental oxygen at baseline, CAD, T2DM, HLD, HTN and hypothyroidism admitted from home on 04/24/2023 with unresponsiveness and AMS. Found to be hypoxic on EMS arrival. Placed on bipap in ED--failed and required intubation.   Found to have influenza A with possible bacterial PNA, AKI requiring CRRT 2/15-2/18 renal function has returned, failed multiple SBT/WUA 2/19-2/22, extubated 2/23 but required emergent reintubation 2/25    Extubated again 2/28 tolerating well-transferred out of ICU   Palliative medicine was consulted for assisting with goals of care conversations.  Subjective: Extensive chart review has been completed prior to meeting patient including labs, vital signs, imaging, progress notes, orders, and available advanced directive documents from current and previous encounters.    Visited with Sarah Norton at her bedside. She is awake, alert and able to engage in conversations. No family at bedside.  Sarah Norton denies acute complaints and feels she is more comfortable compared to yesterday. She feels she is getting stronger each day and is eager to return home.  Introduced Financial trader. Reviewed HCPOA and Living Will. Sarah Norton shares she wishes to appoint her son-Sarah Norton as HCPOA.  Attempted to elicit goals of care. Sarah Norton shares she is thankful she was able to  pull through this illness. She is frustrated that she had influenza virus, as this is the first time in many years that she did not get the vaccine.  We discuss code status and the difference between Full Code and Do Not Resuscitate. Sarah Norton is clear she wishes to remain Full Code. We discuss long term goals such as trach/PEG placement if appropriate, Sarah Norton is clear she would not be accepting of trach/PEG placement. Encourage Sarah Norton to have these conversations with her family to express her wishes.  Called and spoke with son-Sarah. Reviewed conversations had with Sarah Norton regarding goals of care. Introduced AD document, left document at bedside, informed Richardson Dopp to notify chaplain services if  they were ready to complete document.  Answered and addressed all questions and concerns. PMT will step away from daily visits as goals/plans are clear and will follow peripherally. Encouraged Sarah Norton and family to reach out to PMT if needs arise. Please reengage if plans/goals change.  Objective:  Physical Exam Constitutional:      General: She is not in acute distress. Pulmonary:     Effort: Pulmonary effort is normal. No respiratory distress.  Skin:    General: Skin is warm and dry.  Neurological:     Mental Status: She is alert. Mental status is at baseline.     Motor: Weakness present.  Psychiatric:        Mood and Affect: Mood normal.        Thought Content: Thought content normal.             Vital Signs: BP 135/82 (BP Location: Right Arm)   Pulse 72   Temp 98.9 F (37.2 C) (Oral)   Resp 18   Ht 5' 7.99" (1.727 m)   Wt 98.9 kg   SpO2 95%   BMI 33.16 kg/m  SpO2: SpO2: 95 % O2 Device: O2 Device: Nasal Cannula O2 Flow Rate: O2 Flow Rate (L/min): 3 L/min   Palliative Care Assessment & Plan   Assessment/Recommendation/Plan  Advanced Directive document left at bedside, reviewed with Ms. Smarr and son, notify chaplain services if patient/family wishes to complete  document   Thank you for allowing the Palliative Medicine Team to assist in the care of this patient.  Total time:  50 minutes  Time spent includes: Detailed review of medical records (labs, imaging, vital signs), medically appropriate exam (mental status, respiratory, cardiac, skin), discussed with treatment team, counseling and educating patient, family and staff, documenting clinical information, medication management and coordination of care.  Leeanne Deed, DNP, AGNP-C Palliative Medicine   Please contact Palliative Medicine Team phone at [phone] for questions and concerns.

## 2023-05-11 NOTE — Progress Notes (Signed)
 NAME:  Sarah Norton, MRN:  [id number], DOB:  10-21-1955, LOS: 17 ADMISSION DATE:  04/24/2023, CONSULTATION DATE:  04/24/23 REFERRING MD:  Dr. Rosalia Hammers, CHIEF COMPLAINT:  Acute Respiratory Distress, hypoxia, AMS   Brief Pt Description / Synopsis:  68 y.o female admitted with Acute Metabolic Encephalopathy and Acute Hypoxic and Hypercapnic Respiratory Failure in the setting of Acute COPD Exacerbation due to Influenza A infection and questionable superimposed bacterial pneumonia, failed trial of BiPAP requiring intubation and mechanical ventilation.  Course complicated by AKI requiring initiation of CRRT.  History of Present Illness:  Sarah Norton is a 68 year old female with a past medical history significant for COPD on home supplemental oxygen and diabetes mellitus who presented to [facility] ED on 04/24/2023 due to unresponsiveness.  Upon EMS arrival she was noted to be hypoxic with O2 saturations in the 40s for which she was placed on CPAP with improvement in sats to the 80s.  Upon arrival to the ED she remained somnolent but slightly arousable, and given her hypoxia, she was transitioned to BiPAP  ED Course: Initial Vital Signs: Temperature 96.5 F axillary, pulse 87, respiratory rate 33, blood pressure 136/97, SpO2 78% Significant Labs: Sodium 134, glucose 313, BUN 59, creatinine 1.27, BNP 436, high-sensitivity troponin 139, lactic acid 1.7, WBC 33.4, D-dimer 2.13 VBG: pH 7.19/pCO2 82/pO2 39/bicarb 31.3 Positive for influenza A Urinalysis negative for UTI Imaging Chest X-ray>>IMPRESSION: *Bibasilar heterogeneous opacities, favored to represent multilobar pneumonia. Follow-up to clearing is recommended. CTa Chest>> pending currently Medications Administered: 1 L normal saline bolus, 125 mg Solu-Medrol, IV azithromycin and ceftriaxone, Levophed drip initiated Despite BiPAP patient's ABG worsened with worsening mental status.  Therefore ED provider elected to intubate.  PCCM is asked to admit for further  workup and treatment.  05/09/23- patient appears to have aspirated overnight.  She has thick discolored debris coming from ETT. She has no air leak on ETT eval for extubation.  We reviewed her care plan and refined therapy during multi disciplinary rounds.  05/11/23- patient has improved and is working with PT/OT with goal of dc to home.    Pertinent  Medical History   Past Medical History:  Diagnosis Date   Arthritis    Asthma    Cataract of both eyes    Complication of anesthesia    woke up during surgery x1   COPD (chronic obstructive pulmonary disease) (HCC)    Coronary artery disease    DDD (degenerative disc disease), lumbar    Diabetes mellitus without complication (HCC)    Dyspnea    DUE TO COPD   Hernia of abdominal wall    History of hiatal hernia    small   Hyperlipidemia    Hypertension    Hypothyroidism    hashimotos throiditis  -thyroidectomy  2000   Occasional tremors    Oxygen desaturation    NOW PT ON 3-4 LITERS [place] DAILY    Micro Data:  2/13: COVID/FLU/RSV PCR>> + Influenza A 2/13: Blood culture x2>> no growth 2/13: Strep pneumo urinary antigen>> negative 2/13: Legionella urinary antigen>> negative 2/13: HIV screen>>negative 2/13: MRSA PCR>> negative 2/15: Tracheal aspirate>> no growth 2/25: Repeat Tracheal aspirate>> Gram + cocci & rods 2/26: MRSA PCR>> negative  Antimicrobials:   Anti-infectives (From admission, onward)    Start     Dose/Rate Route Frequency Ordered Stop   05/07/23 1400  piperacillin-tazobactam (ZOSYN) IVPB 3.375 g        3.375 g 12.5 mL/hr over 240 Minutes Intravenous Every 8 hours  05/07/23 1114 05/11/23 2359   05/06/23 1230  azithromycin (ZITHROMAX) tablet 250 mg  Status:  Discontinued        250 mg Per Tube Daily 05/06/23 1140 05/07/23 1114   04/30/23 2200  ceFEPIme (MAXIPIME) 1 g in sodium chloride 0.9 % 100 mL IVPB  Status:  Discontinued        1 g 200 mL/hr over 30 Minutes Intravenous Every 24 hours 04/29/23 1842 04/30/23  1006   04/29/23 1145  linezolid (ZYVOX) tablet 600 mg  Status:  Discontinued        600 mg Per Tube Every 12 hours 04/29/23 1052 04/30/23 1006   04/28/23 1015  vancomycin (VANCOREADY) IVPB 1250 mg/250 mL        1,250 mg 166.7 mL/hr over 90 Minutes Intravenous  Once 04/28/23 0921 04/28/23 1209   04/28/23 1000  oseltamivir (TAMIFLU) 6 MG/ML suspension 30 mg  Status:  Discontinued        30 mg Per Tube Daily 04/28/23 0737 04/28/23 0747   04/28/23 1000  oseltamivir (TAMIFLU) 6 MG/ML suspension 75 mg        75 mg Per Tube Daily 04/28/23 0747 04/29/23 1155   04/27/23 1000  oseltamivir (TAMIFLU) 6 MG/ML suspension 75 mg  Status:  Discontinued        75 mg Per Tube Daily 04/26/23 1834 04/28/23 0737   04/27/23 0815  vancomycin (VANCOCIN) IVPB 1000 mg/200 mL premix        1,000 mg 200 mL/hr over 60 Minutes Intravenous  Once 04/27/23 0724 04/27/23 0921   04/26/23 2200  ceFEPIme (MAXIPIME) 2 g in sodium chloride 0.9 % 100 mL IVPB  Status:  Discontinued        2 g 200 mL/hr over 30 Minutes Intravenous Every 12 hours 04/26/23 1834 04/29/23 1842   04/26/23 1000  oseltamivir (TAMIFLU) 6 MG/ML suspension 30 mg  Status:  Discontinued        30 mg Per Tube Daily 04/25/23 1017 04/26/23 1834   04/25/23 2000  vancomycin (VANCOREADY) IVPB 1250 mg/250 mL  Status:  Discontinued        1,250 mg 166.7 mL/hr over 90 Minutes Intravenous Every 24 hours 04/24/23 2106 04/25/23 1151   04/25/23 1300  ceFEPIme (MAXIPIME) 2 g in sodium chloride 0.9 % 100 mL IVPB  Status:  Discontinued        2 g 200 mL/hr over 30 Minutes Intravenous Every 24 hours 04/25/23 1157 04/26/23 1834   04/25/23 1200  azithromycin (ZITHROMAX) 500 mg in sodium chloride 0.9 % 250 mL IVPB        500 mg 250 mL/hr over 60 Minutes Intravenous Every 24 hours 04/24/23 1557 04/28/23 1312   04/25/23 1150  vancomycin variable dose per unstable renal function (pharmacist dosing)  Status:  Discontinued         Does not apply See admin instructions 04/25/23 1151  04/29/23 1052   04/25/23 1100  cefTRIAXone (ROCEPHIN) 2 g in sodium chloride 0.9 % 100 mL IVPB  Status:  Discontinued        2 g 200 mL/hr over 30 Minutes Intravenous Every 24 hours 04/24/23 1557 04/25/23 1021   04/24/23 2200  oseltamivir (TAMIFLU) 6 MG/ML suspension 30 mg  Status:  Discontinued        30 mg Per Tube 2 times daily 04/24/23 1559 04/25/23 1017   04/24/23 2045  vancomycin (VANCOREADY) IVPB 2000 mg/400 mL        2,000 mg 200 mL/hr over 120 Minutes  Intravenous  Once 04/24/23 2034 04/25/23 0045   04/24/23 1245  cefTRIAXone (ROCEPHIN) 2 g in sodium chloride 0.9 % 100 mL IVPB        2 g 200 mL/hr over 30 Minutes Intravenous Once 04/24/23 1244 04/24/23 1407   04/24/23 1245  azithromycin (ZITHROMAX) 500 mg in sodium chloride 0.9 % 250 mL IVPB        500 mg 250 mL/hr over 60 Minutes Intravenous  Once 04/24/23 1244 04/24/23 1513       Significant Hospital Events: Including procedures, antibiotic start and stop dates in addition to other pertinent events   2/13: Presented to ED with AMS, acute respiratory distress and hypoxia.  Initially trialed on BiPAP but with worsening ABG and mental status.  ED provider intubated.  PCCM asked to admit. 04/25/2023 - Patient with improving respiratory status however with worsening wbc and procalcitonin.  04/26/2023 - HD cath placed and started on CRRT. 2/17 remains on vent, on CRRT 02/18: CRRT discontinued  02/19: On minimal vent settings.  Plan for WUA/SBT as tolerated utilizing Precedex.  With SBT, increased RR/WOB/accessory muscle use.  Adequate urine output and electrolytes acceptable, no plan for HD today. 02/20: Remains on minimal vent support, SBT as tolerated. On WUA with Precedex, she opens eyes but not following commands,  MRI Brain negative for acute intracranial abnormality. Faild SBT due to tachypnea (RR 40's) and increased WOB 02/21: No significant events noted overnight.  Afebrile, hemodynamically stable, not requiring vasopressors.  Leukocytosis improving. On minimal vent support, plan for WUA and SBT as tolerated ~ failed SBT due to tachypnea (RR 40's) and increased WOB.  Not following commands on WUA, will obtain EEG. 05/03/2023: Failed SBT due to altered mental status.   05/04/23: Tolerated SBT and was extubated to HF [place] 05/05/23- patient passed SLP for ice chips only thus far. Remains on HHFNC weaned to 40/40, drowsy during interview.  05/06/23- patient with GCS7 on exam this morning with labored breathing on BIPAP and tachyarrythmia. Will proceed with intubation emergently.  Family contacted husband is POA 05/07/23: Will not perform SBT today as she was re-intubated yesterday, likely needs Trach.  Tracheal aspirate with gram+ rods & cocci, low grade fever, will start empiric Zosyn and stop Azithromycin.   Objective   Blood pressure 135/82, pulse 72, temperature 98.9 F (37.2 C), temperature source Oral, resp. rate 18, height 5' 7.99" (1.727 m), weight 108.3 kg, SpO2 95%.    FiO2 (%):  [30 %] 30 % PEEP:  [5 cmH20] 5 cmH20 Pressure Support:  [10 cmH20] 10 cmH20   Intake/Output Summary (Last 24 hours) at 05/11/2023 1504 Last data filed at 05/10/2023 2000 Gross per 24 hour  Intake 62.84 ml  Output 1085 ml  Net -1022.16 ml   Filed Weights   05/09/23 0434 05/10/23 0500 05/11/23 0716  Weight: 105.1 kg 98.9 kg 108.3 kg    Examination: General: Acute on chronically ill appearing obese female, laying in bed, intubated and sedated, in NAD HENT: Atraumatic, normocephalic, PERRLA, neck supple, difficult to assess JVD due to body habitus   Lungs: Coarse breath sounds throughout, even, overbreathing the vent Cardiovascular: Atrial flutter and fib +, rate controlled, no murmurs, rubs, gallops Abdomen: Obese, soft, distended, hypoactive bowel sounds, no pain with palpation Extremities: Normal bulk and tone, no deformities, 1+edema to bilateral lower extremities, venous stasis discoloration bilateral lower extremities Neuro:  Sedated, currently not following commands, withdraws from pain, pupils PERRL 4 mm bilaterally brisk GU: Foley catheter in place draining yellow  urine   Assessment & Plan:       #Acute Hypoxic & Hypercapnic Respiratory Failure in the setting of AECOPD and Influenza infection,  EXTUBATED 2/23, REINTUBATED 2/25 CTa Chest on 2/13: negative for PE, AAA or dissection. Concerning for interstitial and alveolar edema vs pneumonitis, and bibasilar consolidation (R >L). -on nasal canula  #Shock: PRESENT ON ADISSION - SEPSIS HAS BEEN RULED OUT #Elevated Troponin in setting of demand ischemia  #Atrial Fibrillation with RVR ~ RATE CONTROLLED Echocardiogram 04/25/23: LVEF 60-65%, mild LVH, normal diastolic parameters, RV systolic function is normal, moderately elevated pulmonary artery systolic pressure, mild to moderate AS -Continuous cardiac monitoring -Maintain MAP >65 -Vasopressors as needed to maintain MAP goal -Lactic acid has normalized -HS Troponin peaked at 130 -Diuresis as BP and renal function permits -Continue Heparin gtt: dosing per pharmacy   #Influenza A infection ~ TREATED -PRESENT ON ADMISSION  #Concern for potential VAP vs Aspiration -Monitor fever curve -Trend WBC's  -Follow cultures as above -Completed course of Cefepime and Linezolid on 2/19 -Start empiric Zosyn pending cultures & sensitivities  #Acute Kidney Injury on CKD Stage IIIa  #Mild Hyponatremia ~ RESOLVED -Monitor I&O's / urinary output -Follow BMP -Ensure adequate renal perfusion -Avoid nephrotoxic agents as able -Replace electrolytes as indicated ~ Pharmacy following for assistance with electrolyte replacement -Nephrology has signed off  #Diabetes Mellitus -CBG's q4h; Target range of 140 to 180 -SSI -Follow ICU Hypo/Hyperglycemia protocol  #Acute Metabolic Encephalopathy, suspect CO2 Narcosis #Sedation needs in setting of mechanical ventilation -CT Head on 2/13 negative for acute intracranial  process -MRI Brain on 2/20 negative for acute intracranial process -EEG 2/21 suggestive of diffuse encephalopathy, no seizures or epileptiform discharges -Treatment of metabolic derangements as outlined above -Maintain a RASS goal of 0 to -1 -Propofol as needed to maintain RASS goal -Avoid sedating medications as able -Daily wake up assessment   Best Practice (right click and "Reselect all SmartList Selections" daily)   Diet/type: NPO, tube feeds DVT prophylaxis: Heparin gtt GI prophylaxis: PPI Lines: N/A Foley:  Yes, and it is still needed Code Status:  full code Last date of multidisciplinary goals of care discussion [2/26]   Labs   CBC: Recent Labs  Lab 05/07/23 0359 05/08/23 0408 05/09/23 0350 05/10/23 0346 05/11/23 0312  WBC 20.4* 19.8* 11.9* 12.1* 11.2*  HGB 10.4* 9.8* 10.1* 11.1* 11.4*  HCT 31.2* 28.9* 30.1* 33.0* 34.3*  MCV 93.7 92.0 91.2 91.4 93.2  PLT 553* 524* 438* 472* 452*    Basic Metabolic Panel: Recent Labs  Lab 05/07/23 0359 05/08/23 0408 05/09/23 0350 05/10/23 0346 05/11/23 0312  NA 139 134* 140 143 139  K 3.6 3.9 3.9 2.9* 3.4*  CL 98 94* 101 97* 100  CO2 31 30 28  33* 29  GLUCOSE 186* 196* 194* 83 85  BUN 68* 94* 82* 59* 46*  CREATININE 1.38* 1.65* 1.37* 1.06* 1.05*  CALCIUM 9.2 9.3 9.5 9.8 9.5  MG 2.2 2.2 2.1 2.1 2.0  PHOS 2.7 3.6 3.2 2.5 2.8   GFR: Estimated Creatinine Clearance: 65.2 mL/min (A) (by C-G formula based on SCr of 1.05 mg/dL (H)). Recent Labs  Lab 05/08/23 0408 05/09/23 0350 05/10/23 0346 05/11/23 0312  WBC 19.8* 11.9* 12.1* 11.2*    Liver Function Tests: Recent Labs  Lab 05/07/23 0359 05/08/23 0408 05/09/23 0350 05/10/23 0346 05/11/23 0312  ALBUMIN 2.9* 2.7* 2.7* 3.0* 2.8*   No results for input(s): "LIPASE", "AMYLASE" in the last 168 hours. No results for input(s): "AMMONIA" in the last 168  hours.  ABG    Component Value Date/Time   PHART 7.4 05/06/2023 1132   PCO2ART 64 (H) 05/06/2023 1132    PO2ART 86 05/06/2023 1132   HCO3 39.6 (H) 05/06/2023 1132   ACIDBASEDEF 3.2 (H) 04/27/2023 1551   O2SAT 97.5 05/06/2023 1132     Coagulation Profile: No results for input(s): "INR", "PROTIME" in the last 168 hours.   Cardiac Enzymes: No results for input(s): "CKTOTAL", "CKMB", "CKMBINDEX", "TROPONINI" in the last 168 hours.  HbA1C: Hemoglobin A1C  Date/Time Value Ref Range Status  11/16/2013 04:11 PM 6.5 (H) 4.2 - 6.3 % Final    Comment:    The American Diabetes Association recommends that a primary goal of therapy should be <7% and that physicians should reevaluate the treatment regimen in patients with HbA1c values consistently >8%.    Hgb A1c MFr Bld  Date/Time Value Ref Range Status  04/25/2023 04:11 AM 6.5 (H) 4.8 - 5.6 % Final    Comment:    (NOTE) Pre diabetes:          5.7%-6.4%  Diabetes:              >6.4%  Glycemic control for   <7.0% adults with diabetes   05/04/2020 04:33 AM 8.0 (H) 4.8 - 5.6 % Final    Comment:    (NOTE) Pre diabetes:          5.7%-6.4%  Diabetes:              >6.4%  Glycemic control for   <7.0% adults with diabetes     CBG: Recent Labs  Lab 05/11/23 0040 05/11/23 0114 05/11/23 0514 05/11/23 0803 05/11/23 1203  GLUCAP 52* 90 92 100* 147*    Review of Systems:   Unable to assess due to AMS/intubation/sedation   Past Medical History:  She,  has a past medical history of Arthritis, Asthma, Cataract of both eyes, Complication of anesthesia, COPD (chronic obstructive pulmonary disease) (HCC), Coronary artery disease, DDD (degenerative disc disease), lumbar, Diabetes mellitus without complication (HCC), Dyspnea, Hernia of abdominal wall, History of hiatal hernia, Hyperlipidemia, Hypertension, Hypothyroidism, Occasional tremors, and Oxygen desaturation.   Surgical History:   Past Surgical History:  Procedure Laterality Date   ABDOMINAL HYSTERECTOMY     CHOLECYSTECTOMY     COLONOSCOPY WITH PROPOFOL N/A 09/08/2014    Procedure: COLONOSCOPY WITH PROPOFOL;  Surgeon: Midge Minium, MD;  Location: [facility] ENDOSCOPY;  Service: Endoscopy;  Laterality: N/A;   GANGLION CYST EXCISION Right 08/15/2020   Procedure: Right index finger cyst removal;  Surgeon: Kennedy Bucker, MD;  Location: [facility] ORS;  Service: Orthopedics;  Laterality: Right;   HIATAL HERNIA REPAIR  2000   OTHER SURGICAL HISTORY     vocal cord surgery due to injury during thyroid surgery   parathyroidectomy  2000   THYROIDECTOMY     TONSILLECTOMY     TUMOR REMOVAL Right Leg     Social History:   reports that she quit smoking about 7 years ago. Her smoking use included cigarettes. She started smoking about 55 years ago. She has a 48 pack-year smoking history. She has never used smokeless tobacco. She reports that she does not drink alcohol and does not use drugs.   Family History:  Her family history includes Breast cancer in her sister; Kidney disease in her mother.   Allergies No Known Allergies   Home Medications  Prior to Admission medications   Medication Sig Start Date End Date Taking? Authorizing Provider  MOUNJARO 10 MG/0.5ML  Pen Inject 10 mg into the skin once a week. 03/27/23  Yes [provider]  albuterol (VENTOLIN HFA) 108 (90 Base) MCG/ACT inhaler Inhale 2 puffs into the lungs every 4 (four) hours as needed for wheezing or shortness of breath.    [provider]  allopurinol (ZYLOPRIM) 100 MG tablet Take 100 mg by mouth every morning.    [provider]  amLODipine (NORVASC) 5 MG tablet Take 5 mg by mouth every morning. 07/23/20   [provider]  atorvastatin (LIPITOR) 40 MG tablet Take 40 mg by mouth every morning.    [provider]  benazepril (LOTENSIN) 40 MG tablet Take 40 mg by mouth every morning.    [provider]  chlorhexidine (PERIDEX) 0.12 % solution Use as directed 15 mLs in the mouth or throat as needed.    [provider]  ELIQUIS 5 MG TABS tablet Take 5 mg by  mouth 2 (two) times daily.    [provider]  fluticasone (FLONASE) 50 MCG/ACT nasal spray Place 2 sprays into both nostrils daily as needed for allergies. 03/31/20   [provider]  fluticasone-salmeterol (ADVAIR HFA) 161-09 MCG/ACT inhaler Inhale 2 puffs by mouth twice daily 07/07/21   Erin Fulling, MD  furosemide (LASIX) 80 MG tablet Take 80 mg by mouth every morning.    [provider]  gabapentin (NEURONTIN) 600 MG tablet Take 600 mg by mouth 3 (three) times daily.    [provider]  hydrochlorothiazide (HYDRODIURIL) 25 MG tablet Take 25 mg by mouth every morning.    [provider]  HYDROcodone-acetaminophen (NORCO) 5-325 MG tablet Take 1 tablet by mouth every 6 (six) hours as needed for moderate pain. 08/15/20   Kennedy Bucker, MD  ibuprofen (ADVIL) 800 MG tablet Take 800 mg by mouth every 8 (eight) hours as needed for pain. 07/21/20   [provider]  indomethacin (INDOCIN) 25 MG capsule Take 25 mg by mouth daily.    [provider]  LANTUS SOLOSTAR 100 UNIT/ML Solostar Pen Inject 80 Units into the skin every morning. 05/16/20   [provider]  levothyroxine (SYNTHROID) 200 MCG tablet Take 200 mcg by mouth daily before breakfast.    [provider]  Melatonin 10 MG TABS Take 10 mg by mouth at bedtime. Patient not taking: Reported on 08/15/2020    [provider]  methocarbamol (ROBAXIN) 500 MG tablet Take 1-2 tablets by mouth every 6 (six) hours as needed for muscle spasms. 07/18/20   [provider]  Metoprolol Tartrate 75 MG TABS Take 1 tablet by mouth 2 (two) times daily.    [provider]  montelukast (SINGULAIR) 10 MG tablet Take 10 mg by mouth every morning.    [provider]  [facility] powder Apply 1 Application topically 2 (two) times daily.    [provider]  olopatadine (PATANOL) 0.1 % ophthalmic solution Place 1 drop into both eyes daily.    [provider]  OXYGEN Inhale 3-4 L into the lungs daily.    [provider]  promethazine (PHENERGAN) 25 MG tablet Take 25 mg by mouth every 6 (six) hours as needed for nausea or vomiting.    [provider]  psyllium (REGULOID) 0.52 g capsule Take 0.52 g by mouth 2 (two) times daily.    [provider]  Tiotropium Bromide Monohydrate (SPIRIVA RESPIMAT) 1.25 MCG/ACT AERS Inhale 2 puffs into the lungs daily for 30 doses. 10/03/20 11/02/20  Erin Fulling, MD  tiZANidine (ZANAFLEX) 4 MG tablet Take 4 mg by mouth at bedtime.    [provider]  tretinoin (RETIN-A) 0.05 % cream Apply to face qhs, wash off qam 02/18/22   Deirdre Evener, MD  TRULICITY 0.75 MG/0.5ML SOPN Inject 0.75 mg into the skin once a week. wednesday 07/08/20   [provider]  VALERIAN ROOT PO Take 1,200 mg by mouth at bedtime.    [provider]     Critical care provider statement:   Total critical care time: 33 minutes   Performed by: Karna Christmas MD   Critical care time was exclusive of separately billable procedures and treating other patients.   Critical care was necessary to treat or prevent imminent or life-threatening deterioration.   Critical care was time spent personally by me on the following activities: development of treatment plan with patient and/or surrogate as well as nursing, discussions with consultants, evaluation of patient's response to treatment, examination of patient, obtaining history from patient or surrogate, ordering and performing treatments and interventions, ordering and review of laboratory studies, ordering and review of radiographic studies, pulse oximetry and re-evaluation of patient's condition.    Vida Rigger, M.D.  Pulmonary & Critical Care Medicine

## 2023-05-11 NOTE — Plan of Care (Signed)
  Problem: Education: Goal: Knowledge of General Education information will improve Description: Including pain rating scale, medication(s)/side effects and non-pharmacologic comfort measures Outcome: Progressing   Problem: Health Behavior/Discharge Planning: Goal: Ability to manage health-related needs will improve Outcome: Progressing   Problem: Clinical Measurements: Goal: Ability to maintain clinical measurements within normal limits will improve Outcome: Progressing Goal: Will remain free from infection Outcome: Progressing Goal: Diagnostic test results will improve Outcome: Progressing Goal: Respiratory complications will improve Outcome: Progressing Goal: Cardiovascular complication will be avoided Outcome: Progressing   Problem: Activity: Goal: Risk for activity intolerance will decrease Outcome: Progressing   Problem: Nutrition: Goal: Adequate nutrition will be maintained Outcome: Progressing   Problem: Coping: Goal: Level of anxiety will decrease Outcome: Progressing   Problem: Elimination: Goal: Will not experience complications related to bowel motility Outcome: Progressing Goal: Will not experience complications related to urinary retention Outcome: Progressing   Problem: Pain Managment: Goal: General experience of comfort will improve and/or be controlled Outcome: Progressing   Problem: Safety: Goal: Ability to remain free from injury will improve Outcome: Progressing   Problem: Skin Integrity: Goal: Risk for impaired skin integrity will decrease Outcome: Progressing   Problem: Education: Goal: Ability to describe self-care measures that may prevent or decrease complications (Diabetes Survival Skills Education) will improve Outcome: Progressing   Problem: Coping: Goal: Ability to adjust to condition or change in health will improve Outcome: Progressing

## 2023-05-11 NOTE — Plan of Care (Signed)
  Problem: Health Behavior/Discharge Planning: Goal: Ability to manage health-related needs will improve Outcome: Progressing   Problem: Clinical Measurements: Goal: Ability to maintain clinical measurements within normal limits will improve Outcome: Progressing   Problem: Nutrition: Goal: Adequate nutrition will be maintained Outcome: Progressing   Problem: Elimination: Goal: Will not experience complications related to urinary retention Outcome: Progressing

## 2023-05-12 DIAGNOSIS — L8990 Pressure ulcer of unspecified site, unspecified stage: Secondary | ICD-10-CM | POA: Insufficient documentation

## 2023-05-12 DIAGNOSIS — J9602 Acute respiratory failure with hypercapnia: Secondary | ICD-10-CM | POA: Diagnosis not present

## 2023-05-12 DIAGNOSIS — J9601 Acute respiratory failure with hypoxia: Secondary | ICD-10-CM | POA: Diagnosis not present

## 2023-05-12 DIAGNOSIS — E669 Obesity, unspecified: Secondary | ICD-10-CM | POA: Insufficient documentation

## 2023-05-12 DIAGNOSIS — I4891 Unspecified atrial fibrillation: Secondary | ICD-10-CM | POA: Insufficient documentation

## 2023-05-12 LAB — CBC
HCT: 32.1 % — ABNORMAL LOW (ref 36.0–46.0)
Hemoglobin: 10.7 g/dL — ABNORMAL LOW (ref 12.0–15.0)
MCH: 30.9 pg (ref 26.0–34.0)
MCHC: 33.3 g/dL (ref 30.0–36.0)
MCV: 92.8 fL (ref 80.0–100.0)
Platelets: 377 10*3/uL (ref 150–400)
RBC: 3.46 MIL/uL — ABNORMAL LOW (ref 3.87–5.11)
RDW: 16.3 % — ABNORMAL HIGH (ref 11.5–15.5)
WBC: 9.7 10*3/uL (ref 4.0–10.5)
nRBC: 0 % (ref 0.0–0.2)

## 2023-05-12 LAB — RENAL FUNCTION PANEL
Albumin: 2.9 g/dL — ABNORMAL LOW (ref 3.5–5.0)
Anion gap: 9 (ref 5–15)
BUN: 41 mg/dL — ABNORMAL HIGH (ref 8–23)
CO2: 27 mmol/L (ref 22–32)
Calcium: 9.4 mg/dL (ref 8.9–10.3)
Chloride: 100 mmol/L (ref 98–111)
Creatinine, Ser: 1.12 mg/dL — ABNORMAL HIGH (ref 0.44–1.00)
GFR, Estimated: 53 mL/min — ABNORMAL LOW (ref >60)
Glucose, Bld: 106 mg/dL — ABNORMAL HIGH (ref 70–99)
Phosphorus: 3.2 mg/dL (ref 2.5–4.6)
Potassium: 3.6 mmol/L (ref 3.5–5.1)
Sodium: 136 mmol/L (ref 135–145)

## 2023-05-12 LAB — GLUCOSE, CAPILLARY
Glucose-Capillary: 106 mg/dL — ABNORMAL HIGH (ref 70–99)
Glucose-Capillary: 138 mg/dL — ABNORMAL HIGH (ref 70–99)
Glucose-Capillary: 177 mg/dL — ABNORMAL HIGH (ref 70–99)
Glucose-Capillary: 179 mg/dL — ABNORMAL HIGH (ref 70–99)
Glucose-Capillary: 208 mg/dL — ABNORMAL HIGH (ref 70–99)
Glucose-Capillary: 60 mg/dL — ABNORMAL LOW (ref 70–99)
Glucose-Capillary: 98 mg/dL (ref 70–99)

## 2023-05-12 LAB — C-REACTIVE PROTEIN: CRP: 0.6 mg/dL (ref <1.0)

## 2023-05-12 MED ORDER — METOPROLOL TARTRATE 25 MG PO TABS
75.0000 mg | ORAL_TABLET | Freq: Two times a day (BID) | ORAL | Status: DC
  Administered 2023-05-12 – 2023-05-15 (×7): 75 mg via ORAL
  Filled 2023-05-12 (×7): qty 3

## 2023-05-12 MED ORDER — INSULIN GLARGINE-YFGN 100 UNIT/ML ~~LOC~~ SOLN
15.0000 [IU] | Freq: Two times a day (BID) | SUBCUTANEOUS | Status: DC
  Administered 2023-05-12 – 2023-05-13 (×3): 15 [IU] via SUBCUTANEOUS
  Filled 2023-05-12 (×4): qty 0.15

## 2023-05-12 MED ORDER — DEXAMETHASONE 4 MG PO TABS: 2.0000 mg | ORAL_TABLET | Freq: Every day | ORAL | Status: DC

## 2023-05-12 MED ORDER — UMECLIDINIUM BROMIDE 62.5 MCG/ACT IN AEPB
1.0000 | INHALATION_SPRAY | Freq: Every day | RESPIRATORY_TRACT | Status: DC
  Administered 2023-05-12 – 2023-05-15 (×4): 1 via RESPIRATORY_TRACT
  Filled 2023-05-12: qty 7

## 2023-05-12 MED ORDER — DEXAMETHASONE 4 MG PO TABS
4.0000 mg | ORAL_TABLET | Freq: Every day | ORAL | Status: AC
  Administered 2023-05-13 – 2023-05-15 (×3): 4 mg via ORAL
  Filled 2023-05-12 (×3): qty 1

## 2023-05-12 MED ORDER — GLUCERNA SHAKE PO LIQD
237.0000 mL | Freq: Three times a day (TID) | ORAL | Status: DC
Start: 2023-05-12 — End: 2023-05-15
  Administered 2023-05-12 – 2023-05-15 (×8): 237 mL via ORAL

## 2023-05-12 MED ORDER — ADULT MULTIVITAMIN W/MINERALS CH
1.0000 | ORAL_TABLET | Freq: Every day | ORAL | Status: DC
  Administered 2023-05-12 – 2023-05-15 (×4): 1 via ORAL
  Filled 2023-05-12 (×4): qty 1

## 2023-05-12 MED ORDER — ATORVASTATIN CALCIUM 20 MG PO TABS
40.0000 mg | ORAL_TABLET | Freq: Every day | ORAL | Status: DC
Start: 2023-05-12 — End: 2023-05-15
  Administered 2023-05-12 – 2023-05-14 (×3): 40 mg via ORAL
  Filled 2023-05-12 (×3): qty 2

## 2023-05-12 MED ORDER — TIOTROPIUM BROMIDE MONOHYDRATE 1.25 MCG/ACT IN AERS
2.0000 | INHALATION_SPRAY | Freq: Every day | RESPIRATORY_TRACT | Status: DC
Start: 2023-05-12 — End: 2023-05-12

## 2023-05-12 MED ORDER — MOMETASONE FURO-FORMOTEROL FUM 200-5 MCG/ACT IN AERO
2.0000 | INHALATION_SPRAY | Freq: Two times a day (BID) | RESPIRATORY_TRACT | Status: DC
  Administered 2023-05-12 – 2023-05-15 (×7): 2 via RESPIRATORY_TRACT
  Filled 2023-05-12: qty 8.8

## 2023-05-12 NOTE — Plan of Care (Signed)

## 2023-05-12 NOTE — Progress Notes (Signed)
 Nutrition Follow-up  DOCUMENTATION CODES:   Obesity unspecified  INTERVENTION:   -MVI with minerals daily -Glucerna Shake po TID, each supplement provides 220 kcal and 10 grams of protein  -Downgrade diet to dysphagia 3 for ease of intake -Feeding assistance with meals; pt with decreased hand grip strength and needs help opening packages and with tray set up  NUTRITION DIAGNOSIS:   Inadequate oral intake related to inability to eat as evidenced by NPO status.  Progressing; advanced to PO diet on 05/09/23  GOAL:   Patient will meet greater than or equal to 90% of their needs  Progressing   MONITOR:   PO intake, Supplement acceptance  REASON FOR ASSESSMENT:   Consult Assessment of nutrition requirement/status  ASSESSMENT:   68 y/o female with h/o DM, HTN, thyroid disease s/p thyroidectomy, COPD, hiatal hernia s/p repair, polycythemia, HLD, DDD, Afib, CAD and CKD III who is admitted with Flu A, AKI, sepsis, shock, PNA and COPD exacerbation.  2/28- extubated, advanced to heart healthy, carb modified diet  Reviewed I/O's: +450 ml x 24 hours and -12.4 L since 04/28/23  Spoke with pt and daughter at bedside. Pt deferred most of interview to daughter. Pt reports she is happy to be out of the ICU and making progress. Noted pt consumed about 75% of pancakes and 100% of coffee and milk today.   Per daughter, pt usually has a good appetite, however, has decreased since extubation. Pt with multiple missing teeth, was supposed to get dentures prior to hospitalization and thinks modified diet will help pt eat more. Discussed importance of good meal and supplement intake to promote healing. Pt amenable to supplements.   Per daughter, pt with decreased hand grip strength and has difficulty opening packages due to this. RD will add order for meal assistance.   Wt has been stable over the past week.   Medications reviewed and include decadron.   Labs reviewed: CBGS: 60-198 (inpatient  orders for glycemic control are 0-20 units insulin aspart every 4 hours and 15 units insulin glargine-yfgn BID).    Diet Order:   Diet Order             DIET DYS 3 Fluid consistency: Thin  Diet effective now                   EDUCATION NEEDS:   Education needs have been addressed  Skin:  Skin Assessment: Reviewed RN Assessment (blanchable redness anus) Skin Integrity Issues:: Stage I Stage I: anus  Last BM:  05/10/23 (type 7)  Height:   Ht Readings from Last 1 Encounters:  05/02/23 5' 7.99" (1.727 m)    Weight:   Wt Readings from Last 1 Encounters:  05/11/23 108.3 kg    Ideal Body Weight:  63.6 kg  BMI:  Body mass index is 36.31 kg/m.  Estimated Nutritional Needs:   Kcal:  1700-1900  Protein:  85-100 grams  Fluid:  > 1.7 L    Levada Schilling, RD, LDN, CDCES Registered Dietitian III Certified Diabetes Care and Education Specialist If unable to reach this RD, please use "RD Inpatient" group chat on secure chat between hours of 8am-4 pm daily

## 2023-05-12 NOTE — Plan of Care (Signed)
  Problem: Education: Goal: Knowledge of General Education information will improve Description: Including pain rating scale, medication(s)/side effects and non-pharmacologic comfort measures Outcome: Progressing   Problem: Health Behavior/Discharge Planning: Goal: Ability to manage health-related needs will improve Outcome: Progressing   Problem: Clinical Measurements: Goal: Ability to maintain clinical measurements within normal limits will improve Outcome: Progressing Goal: Will remain free from infection Outcome: Progressing Goal: Diagnostic test results will improve Outcome: Progressing Goal: Respiratory complications will improve Outcome: Progressing Goal: Cardiovascular complication will be avoided Outcome: Progressing   Problem: Activity: Goal: Risk for activity intolerance will decrease Outcome: Progressing   Problem: Nutrition: Goal: Adequate nutrition will be maintained Outcome: Progressing   Problem: Coping: Goal: Level of anxiety will decrease Outcome: Progressing   Problem: Elimination: Goal: Will not experience complications related to bowel motility Outcome: Progressing Goal: Will not experience complications related to urinary retention Outcome: Progressing   Problem: Pain Managment: Goal: General experience of comfort will improve and/or be controlled Outcome: Progressing   Problem: Safety: Goal: Ability to remain free from injury will improve Outcome: Progressing   Problem: Skin Integrity: Goal: Risk for impaired skin integrity will decrease Outcome: Progressing   Problem: Education: Goal: Ability to describe self-care measures that may prevent or decrease complications (Diabetes Survival Skills Education) will improve Outcome: Progressing   Problem: Coping: Goal: Ability to adjust to condition or change in health will improve Outcome: Progressing   Problem: Fluid Volume: Goal: Ability to maintain a balanced intake and output will  improve Outcome: Progressing   Problem: Health Behavior/Discharge Planning: Goal: Ability to identify and utilize available resources and services will improve Outcome: Progressing Goal: Ability to manage health-related needs will improve Outcome: Progressing   Problem: Metabolic: Goal: Ability to maintain appropriate glucose levels will improve Outcome: Progressing   Problem: Nutritional: Goal: Maintenance of adequate nutrition will improve Outcome: Progressing Goal: Progress toward achieving an optimal weight will improve Outcome: Progressing   Problem: Skin Integrity: Goal: Risk for impaired skin integrity will decrease Outcome: Progressing   Problem: Tissue Perfusion: Goal: Adequacy of tissue perfusion will improve Outcome: Progressing   Problem: Activity: Goal: Ability to tolerate increased activity will improve Outcome: Progressing   Problem: Respiratory: Goal: Ability to maintain a clear airway and adequate ventilation will improve Outcome: Progressing   Problem: Role Relationship: Goal: Method of communication will improve Outcome: Progressing

## 2023-05-12 NOTE — Progress Notes (Signed)
 Occupational Therapy Treatment Patient Details Name: Sarah Norton MRN: [id number] DOB: 1956-01-11 Today's Date: 05/12/2023   History of present illness 68 y.o female admitted with Acute Metabolic Encephalopathy and Acute Hypoxic and Hypercapnic Respiratory Failure in the setting of Acute COPD Exacerbation due to Influenza A infection and questionable superimposed bacterial pneumonia, failed trial of BiPAP requiring intubation and mechanical ventilation.  Course complicated by AKI requiring initiation of CRRT, afib with RVR.   OT comments  Pt seen for OT tx and co-tx with PT to optimize safety with mobility efforts. Pt pleasant, agreeable to session. Pt appears more alert this date, however, continues to demonstrate impaired problem solving requiring significantly increased time to process, sequence, and initiate tasks requiring intermittent multimodal simple cues. Pt required MOD-MAX A +2 for bed mobility and MAX A +2 for transfer attempts with RW. Pt tolerated sitting EOB for ~25min while engaging in light dynamic balance reaching and seated grooming task. Endorses fatigue afterwards and required MAX A +2 to return to bed. Pt continues to benefit from skilled OT services.       If plan is discharge home, recommend the following:  Two people to help with walking and/or transfers;A lot of help with bathing/dressing/bathroom;Direct supervision/assist for medications management;Supervision due to cognitive status;Assist for transportation;Assistance with cooking/housework;Assistance with feeding;Help with stairs or ramp for entrance;Direct supervision/assist for financial management   Equipment Recommendations  Other (comment) (defer)    Recommendations for Other Services      Precautions / Restrictions Precautions Precautions: Fall Recall of Precautions/Restrictions: Impaired Restrictions Weight Bearing Restrictions Per Provider Order: No       Mobility Bed Mobility Overal bed mobility:  Needs Assistance Bed Mobility: Supine to Sit, Sit to Supine     Supine to sit: Mod assist, +2 for physical assistance Sit to supine: Max assist, +2 for physical assistance   General bed mobility comments: VC for initiation, sequencing, bed rail use    Transfers Overall transfer level: Needs assistance Equipment used: Rolling walker (2 wheels) Transfers: Sit to/from Stand Sit to Stand: Max assist, +2 physical assistance           General transfer comment: unsuccessful on 1st attempt, improving to partial stand on 2nd attempt with MAX  A+2     Balance Overall balance assessment: Needs assistance Sitting-balance support: Feet supported, No upper extremity supported, Bilateral upper extremity supported, Single extremity supported Sitting balance-Leahy Scale: Fair Sitting balance - Comments: initial poor balance requiring MIN A improving to eventual SBA for static sitting and CGA for minimal dynamic balance tasks     Standing balance-Leahy Scale: Zero                             ADL either performed or assessed with clinical judgement   ADL Overall ADL's : Needs assistance/impaired     Grooming: Sitting;Set up;Oral care;Minimal assistance Grooming Details (indicate cue type and reason): 1 step VC and ++time to follow simple commands, unable to use L grip to squeeze toothpaste onto brush requiring MIN A                                    Extremity/Trunk Assessment              Vision       Perception     Praxis     Communication Communication Factors Affecting Communication:  Difficulty expressing self   Cognition Arousal: Alert Behavior During Therapy: Flat affect Cognition: No family/caregiver present to determine baseline             OT - Cognition Comments: Pt slow to process, impaired problem solving requiring multimodal simple cues, difficulty with L versus R                 Following commands: Impaired Following  commands impaired: Follows one step commands inconsistently      Cueing   Cueing Techniques: Verbal cues, Gestural cues, Tactile cues, Visual cues  Exercises      Shoulder Instructions       General Comments      Pertinent Vitals/ Pain       Pain Assessment Pain Assessment: No/denies pain  Home Living                                          Prior Functioning/Environment              Frequency  Min 1X/week        Progress Toward Goals  OT Goals(current goals can now be found in the care plan section)  Progress towards OT goals: Progressing toward goals  Acute Rehab OT Goals Patient Stated Goal: get better OT Goal Formulation: With patient Time For Goal Achievement: 05/24/23 Potential to Achieve Goals: Good  Plan      Co-evaluation    PT/OT/SLP Co-Evaluation/Treatment: Yes Reason for Co-Treatment: To address functional/ADL transfers PT goals addressed during session: Mobility/safety with mobility;Strengthening/ROM OT goals addressed during session: ADL's and self-care      AM-PAC OT "6 Clicks" Daily Activity     Outcome Measure   Help from another person eating meals?: A Little Help from another person taking care of personal grooming?: A Little Help from another person toileting, which includes using toliet, bedpan, or urinal?: A Lot Help from another person bathing (including washing, rinsing, drying)?: A Lot Help from another person to put on and taking off regular upper body clothing?: A Lot Help from another person to put on and taking off regular lower body clothing?: A Lot 6 Click Score: 14    End of Session Equipment Utilized During Treatment: Gait belt;Oxygen;Rolling walker (2 wheels)  OT Visit Diagnosis: Other abnormalities of gait and mobility (R26.89);Hemiplegia and hemiparesis Hemiplegia - Right/Left: Left Hemiplegia - dominant/non-dominant: Non-Dominant Hemiplegia - caused by: Unspecified   Activity Tolerance  Patient tolerated treatment well   Patient Left in bed;with call bell/phone within reach;with bed alarm set   Nurse Communication          Time: 1478-2956 OT Time Calculation (min): 38 min  Charges: OT General Charges $OT Visit: 1 Visit OT Treatments $Self Care/Home Management : 8-22 mins  Arman Filter., MPH, MS, OTR/L ascom [phone] 05/12/23, 1:57 PM

## 2023-05-12 NOTE — Consult Note (Signed)
 PHARMACY CONSULT NOTE - ELECTROLYTES  Pharmacy Consult for Electrolyte Monitoring and Replacement   Recent Labs:  Height: 5' 7.99" (172.7 cm) Weight: 108.3 kg (238 lb 12.1 oz) IBW/kg (Calculated) : 63.88 Estimated Creatinine Clearance: 61.1 mL/min (A) (by C-G formula based on SCr of 1.12 mg/dL (H)). Potassium (mmol/L)  Date Value  05/12/2023 3.6  11/16/2013 3.5   Magnesium (mg/dL)  Date Value  16/12/9602 2.0  11/16/2013 1.0 (L)   Calcium (mg/dL)  Date Value  54/11/8117 9.4   Calcium, Total (mg/dL)  Date Value  14/78/2956 9.1   Albumin (g/dL)  Date Value  21/30/8657 2.9 (L)  11/16/2013 3.8   Phosphorus (mg/dL)  Date Value  84/69/6295 3.2   Sodium (mmol/L)  Date Value  05/12/2023 136  11/16/2013 133 (L)   Assessment  Sarah Norton is a 68 y.o. female presenting with respiratory failure and unresponsive. PMH significant for DM and COPD. Patient was started on CRRT 2/15 then taken off evening of 2/18. They continue to have good urine output. Nephrology has signed off. Pharmacy has been consulted to monitor and replace electrolytes while they're in the ICU.   Goal of Therapy: Electrolytes WNL   Plan:  K 3.6, No supplementation needed at this time Continue to follow-up electrolytes with AM labs tomorrow  Thank you for allowing pharmacy to be a part of this patient's care.  Paulita Fujita, PharmD Clinical Pharmacist 05/12/2023

## 2023-05-12 NOTE — Inpatient Diabetes Management (Signed)
 Inpatient Diabetes Program Recommendations  AACE/ADA: New Consensus Statement on Inpatient Glycemic Control (2015)  Target Ranges:  Prepandial:   less than 140 mg/dL      Peak postprandial:   less than 180 mg/dL (1-2 hours)      Critically ill patients:  140 - 180 mg/dL    Latest Reference Range & Units 05/11/23 00:40 05/11/23 01:14 05/11/23 05:14 05/11/23 08:03 05/11/23 12:03 05/11/23 16:17 05/11/23 20:51 05/12/23 00:23 05/12/23 01:04 05/12/23 06:38  Glucose-Capillary 70 - 99 mg/dL 52 (L) 90 92 387 (H) 564 (H) 198 (H) 79 60 (L) 106 (H) 98   Review of Glycemic Control  Diabetes history: DM2 Outpatient Diabetes medications: Mounjaro 10 mg Qweek Current orders for Inpatient glycemic control: Semglee 20 units BID, Novolog 0-20 units Q4H, Novolog 5 units Q4H; Decadron 6 mg daily (tapering)  Inpatient Diabetes Program Recommendations:    Insulin: Semglee 20 units only given once on 05/11/23 and CBG down to 60 mg/dl at 33:29 today. Tube feedings no longer ordered. Please consider decreasing Semglee to 15 units daily and discontinue Novolog 5 units Q4H (tube feeding coverage).  Thanks, Orlando Penner, RN, MSN, CDCES Diabetes Coordinator Inpatient Diabetes Program [phone] (Team Pager from 8am to 5pm)

## 2023-05-12 NOTE — Progress Notes (Addendum)
 PROGRESS NOTE    Sarah Norton  ZOX:[id number] DOB: 24-Jun-1955 DOA: 04/24/2023 PCP: Emogene Morgan, MD     Brief Narrative:   From ICU record: 68 y.o female admitted with Acute Metabolic Encephalopathy and Acute Hypoxic and Hypercapnic Respiratory Failure in the setting of Acute COPD Exacerbation due to Influenza A infection and questionable superimposed bacterial pneumonia, failed trial of BiPAP requiring intubation and mechanical ventilation. Course complicated by AKI requiring initiation of CRRT.   2/13: Presented to ED with AMS, acute respiratory distress and hypoxia.  Initially trialed on BiPAP but with worsening ABG and mental status.  ED provider intubated.  PCCM asked to admit. 04/25/2023 - Patient with improving respiratory status however with worsening wbc and procalcitonin.  04/26/2023 - HD cath placed and started on CRRT. 2/17 remains on vent, on CRRT 02/18: CRRT discontinued  02/19: On minimal vent settings.  Plan for WUA/SBT as tolerated utilizing Precedex.  With SBT, increased RR/WOB/accessory muscle use.  Adequate urine output and electrolytes acceptable, no plan for HD today. 02/20: Remains on minimal vent support, SBT as tolerated. On WUA with Precedex, she opens eyes but not following commands,  MRI Brain negative for acute intracranial abnormality. Faild SBT due to tachypnea (RR 40's) and increased WOB 02/21: No significant events noted overnight.  Afebrile, hemodynamically stable, not requiring vasopressors. Leukocytosis improving. On minimal vent support, plan for WUA and SBT as tolerated ~ failed SBT due to tachypnea (RR 40's) and increased WOB.  Not following commands on WUA, will obtain EEG. 05/03/2023: Failed SBT due to altered mental status.   05/04/23: Tolerated SBT and was extubated to HF [place] 05/05/23- patient passed SLP for ice chips only thus far. Remains on HHFNC weaned to 40/40, drowsy during interview.  05/06/23- patient with GCS7 on exam this morning with  labored breathing on BIPAP and tachyarrythmia. Will proceed with intubation emergently.  Family contacted husband is POA 05/07/23: Will not perform SBT today as she was re-intubated yesterday, likely needs Trach.  Tracheal aspirate with gram+ rods & cocci, low grade fever, will start empiric Zosyn and stop Azithromycin. 05/09/23- patient appears to have aspirated overnight.  She has thick discolored debris coming from ETT. She has no air leak on ETT eval for extubation.  We reviewed her care plan and refined therapy during multi disciplinary rounds.  05/10/23- patient weaned to 4L/min [place], she's more alert and is being optimized for TRH. Diet started.   Assessment & Plan:   Principal Problem:   Acute respiratory failure with hypoxia and hypercarbia (HCC) Active Problems:   Atypical parkinsonism (HCC)   Diabetes mellitus type 2, uncomplicated (HCC)   Hypertension   COPD (chronic obstructive pulmonary disease) (HCC)   Polycythemia   Pressure injury of skin   Atrial fibrillation (HCC)   Obesity (BMI 30-39.9)  # Acute on chronic hypoxic hypercarbic respiratory failure Failed bipap on arrival, intubated, failed extubation and re-intubated, now extubated and breathing comfortably on 3 liters (home is 2.5) - continue [place] O2  # Influenza A Resolved  # CAP # Aspiration pneumonia S/p courses of cefepime/linezolid and later a course of zosyn. Appears resolved - monitor  # Toxic metabolic encephalopathy Apparently 2/2 co2 narcosis, appears resolved. MRI nothing acute - monitor  # AKI Required crrt in the icu, now discontinued, kidney function more or less back to baseline  # Debility PT/OT advising SNF - TOC consulted  # T2DM With hypoglycemia overnight - decrease semglee to 15 bid - stop q4 insulin - continue  ssi  # COPD Not currently exacerbated - continue decadron taper - resume home spiriva, dulera for home advair  # HTN  Mild elevation today - home atorvastatin - cont  amlodipine - home benazepril, hydrochlorothiazide, lasix on hold - resume home metop  # A-fib Rate controlled - cont home apixaban - resume home metop  # Hypothyroid - home synthroid  DVT prophylaxis: apixaban Code Status: full Family Communication: daughter updated @ bedside 3/3  Level of care: Med-Surg Status is: Inpatient Remains inpatient appropriate because: dispo pending    Consultants:  none  Procedures: Intubation, crrt  Antimicrobials:  See above    Subjective: Reports breathing stable, no pain, tolerating diet  Objective: Vitals:   05/12/23 0017 05/12/23 0635 05/12/23 0728 05/12/23 0806  BP: 123/76 (!) 162/87  (!) 151/76  Pulse: 70 70  69  Resp: 18 18  16   Temp: (!) 97.4 F (36.3 C) 97.8 F (36.6 C)  97.8 F (36.6 C)  TempSrc:      SpO2: 98% 100% 96% 95%  Weight:      Height:        Intake/Output Summary (Last 24 hours) at 05/12/2023 0916 Last data filed at 05/12/2023 0000 Gross per 24 hour  Intake 450 ml  Output --  Net 450 ml   Filed Weights   05/09/23 0434 05/10/23 0500 05/11/23 0716  Weight: 105.1 kg 98.9 kg 108.3 kg    Examination:  General exam: Appears calm and comfortable  Respiratory system: normal wob, no tachypnea, few scattered rhonchi Cardiovascular system: S1 & S2 heard, RRR. No JVD, murmurs, rubs, gallops or clicks. Gastrointestinal system: Abdomen is nondistended, soft and nontender.  Central nervous system: Alert and oriented. No focal neurological deficits. Extremities: Symmetric 5 x 5 power. Skin: No rashes, lesions or ulcers Psychiatry: Judgement and insight appear normal. Mood & affect appropriate.     Data Reviewed: I have personally reviewed following labs and imaging studies  CBC: Recent Labs  Lab 05/08/23 0408 05/09/23 0350 05/10/23 0346 05/11/23 0312 05/12/23 0501  WBC 19.8* 11.9* 12.1* 11.2* 9.7  HGB 9.8* 10.1* 11.1* 11.4* 10.7*  HCT 28.9* 30.1* 33.0* 34.3* 32.1*  MCV 92.0 91.2 91.4 93.2 92.8   PLT 524* 438* 472* 452* 377   Basic Metabolic Panel: Recent Labs  Lab 05/07/23 0359 05/08/23 0408 05/09/23 0350 05/10/23 0346 05/11/23 0312 05/12/23 0501  NA 139 134* 140 143 139 136  K 3.6 3.9 3.9 2.9* 3.4* 3.6  CL 98 94* 101 97* 100 100  CO2 31 30 28  33* 29 27  GLUCOSE 186* 196* 194* 83 85 106*  BUN 68* 94* 82* 59* 46* 41*  CREATININE 1.38* 1.65* 1.37* 1.06* 1.05* 1.12*  CALCIUM 9.2 9.3 9.5 9.8 9.5 9.4  MG 2.2 2.2 2.1 2.1 2.0  --   PHOS 2.7 3.6 3.2 2.5 2.8 3.2   GFR: Estimated Creatinine Clearance: 61.1 mL/min (A) (by C-G formula based on SCr of 1.12 mg/dL (H)). Liver Function Tests: Recent Labs  Lab 05/08/23 0408 05/09/23 0350 05/10/23 0346 05/11/23 0312 05/12/23 0501  ALBUMIN 2.7* 2.7* 3.0* 2.8* 2.9*   No results for input(s): "LIPASE", "AMYLASE" in the last 168 hours. No results for input(s): "AMMONIA" in the last 168 hours. Coagulation Profile: No results for input(s): "INR", "PROTIME" in the last 168 hours. Cardiac Enzymes: No results for input(s): "CKTOTAL", "CKMB", "CKMBINDEX", "TROPONINI" in the last 168 hours. BNP (last 3 results) No results for input(s): "PROBNP" in the last 8760 hours. HbA1C: No results for  input(s): "HGBA1C" in the last 72 hours. CBG: Recent Labs  Lab 05/11/23 2051 05/12/23 0023 05/12/23 0104 05/12/23 0638 05/12/23 0903  GLUCAP 79 60* 106* 98 177*   Lipid Profile: No results for input(s): "CHOL", "HDL", "LDLCALC", "TRIG", "CHOLHDL", "LDLDIRECT" in the last 72 hours. Thyroid Function Tests: No results for input(s): "TSH", "T4TOTAL", "FREET4", "T3FREE", "THYROIDAB" in the last 72 hours. Anemia Panel: No results for input(s): "VITAMINB12", "FOLATE", "FERRITIN", "TIBC", "IRON", "RETICCTPCT" in the last 72 hours. Urine analysis:    Component Value Date/Time   COLORURINE YELLOW (A) 05/10/2023 1330   APPEARANCEUR CLEAR (A) 05/10/2023 1330   APPEARANCEUR Clear 11/16/2013 1900   LABSPEC 1.021 05/10/2023 1330   LABSPEC 1.014  11/16/2013 1900   PHURINE 7.0 05/10/2023 1330   GLUCOSEU NEGATIVE 05/10/2023 1330   GLUCOSEU 150 mg/dL 14/78/2956 2130   HGBUR NEGATIVE 05/10/2023 1330   BILIRUBINUR NEGATIVE 05/10/2023 1330   BILIRUBINUR Negative 11/16/2013 1900   KETONESUR NEGATIVE 05/10/2023 1330   PROTEINUR 100 (A) 05/10/2023 1330   NITRITE NEGATIVE 05/10/2023 1330   LEUKOCYTESUR NEGATIVE 05/10/2023 1330   LEUKOCYTESUR Negative 11/16/2013 1900   Sepsis Labs: @LABRCNTIP (procalcitonin:4,lacticidven:4)  ) Recent Results (from the past 240 hours)  Culture, Respiratory w Gram Stain     Status: None   Collection Time: 05/06/23 11:32 AM   Specimen: Tracheal Aspirate; Respiratory  Result Value Ref Range Status   Specimen Description   Final    TRACHEAL ASPIRATE Performed at [facility], [address], [place], [place] [address]    Special Requests   Final    NONE Performed at [facility], [address] [place], [place] [address]    Gram Stain   Final    RARE WBC PRESENT, PREDOMINANTLY PMN FEW GRAM POSITIVE RODS RARE GRAM POSITIVE COCCI    Culture   Final    MODERATE CORYNEBACTERIUM MINUTISSIMUM Standardized susceptibility testing for this organism is not available. Performed at [facility], [address], [place], [place] [address]    Report Status 05/09/2023 FINAL  Final  MRSA Next Gen by PCR, Nasal     Status: None   Collection Time: 05/07/23 11:31 AM   Specimen: Nasal Mucosa; Nasal Swab  Result Value Ref Range Status   MRSA by PCR Next Gen NOT DETECTED NOT DETECTED Final    Comment: (NOTE) The GeneXpert MRSA Assay (FDA approved for NASAL specimens only), is one component of a comprehensive MRSA colonization surveillance program. It is not intended to diagnose MRSA infection nor to guide or monitor treatment for MRSA infections. Test performance is not FDA approved in patients less than 57 years old. Performed at [facility], [address]  [place], [place] [address]          Radiology Studies: MR BRAIN WO CONTRAST Result Date: 05/10/2023 CLINICAL DATA:  Initial evaluation for mental status change, unknown cause. EXAM: MRI HEAD WITHOUT CONTRAST TECHNIQUE: Multiplanar, multiecho pulse sequences of the brain and surrounding structures were obtained without intravenous contrast. COMPARISON:  MRI from 05/01/2023. FINDINGS: Brain: Examination mildly degraded by motion. Cerebral volume within normal limits. Patchy T2/FLAIR hyperintensity involving the periventricular deep white matter both cerebral hemispheres, consistent with chronic small vessel ischemic disease, mild in nature. No evidence for acute or subacute infarct. Gray-white matter differentiation maintained. No areas of chronic cortical infarction. No acute or chronic intracranial blood products. No mass lesion, midline shift or mass effect. No hydrocephalus or extra-axial fluid collection. Pituitary gland within normal limits. Vascular: Major intracranial  vascular flow voids are maintained. Skull and upper cervical spine: Craniocervical junction within normal limits. Decreased T1 signal intensity noted within the visualized bone marrow, nonspecific, but most commonly related to anemia, smoking or obesity. No scalp soft tissue abnormality. Sinuses/Orbits: Globes orbital soft tissues demonstrate no acute finding. Scattered mucosal thickening noted about the sphenoid ethmoidal and maxillary sinuses. Large bilateral mastoid effusions. Imaged nasopharynx unremarkable. Other: None. IMPRESSION: 1. No acute intracranial abnormality. 2. Mild chronic microvascular ischemic disease for age. 3. Large bilateral mastoid effusions, of uncertain significance. Correlation with physical exam suggested. Imaged nasopharynx is unremarkable. Electronically Signed   By: Rise Mu M.D.   On: 05/10/2023 18:24        Scheduled Meds:  amLODipine  5 mg Oral Daily   apixaban  5 mg Oral BID    dexamethasone (DECADRON) injection  6 mg Intravenous Daily   Followed by   Melene Muller ON 05/13/2023] dexamethasone (DECADRON) injection  4 mg Intravenous Daily   Followed by   Melene Muller ON 05/16/2023] dexamethasone (DECADRON) injection  2 mg Intravenous Daily   famotidine  20 mg Oral BID   feeding supplement (GLUCERNA SHAKE)  237 mL Oral TID BM   insulin aspart  0-20 Units Subcutaneous Q4H   insulin glargine-yfgn  15 Units Subcutaneous BID   ipratropium-albuterol  3 mL Nebulization TID   levothyroxine  175 mcg Oral Q0600   multivitamin with minerals  1 tablet Oral Daily   sodium chloride flush  10-40 mL Intracatheter Q12H   Continuous Infusions:   LOS: 18 days     Silvano Bilis, MD [facility]   If 7PM-7AM, please contact night-coverage [email or web address] Password [facility] 05/12/2023, 9:16 AM

## 2023-05-12 NOTE — Progress Notes (Signed)
 Physical Therapy Treatment Patient Details Name: Sarah Norton MRN: [id number] DOB: [birth year] Today's Date: 05/12/2023   History of Present Illness 68 y.o female admitted with Acute Metabolic Encephalopathy and Acute Hypoxic and Hypercapnic Respiratory Failure in the setting of Acute COPD Exacerbation due to Influenza A infection and questionable superimposed bacterial pneumonia, failed trial of BiPAP requiring intubation and mechanical ventilation.  Course complicated by AKI requiring initiation of CRRT, afib with RVR.    PT Comments  Pt seen for PT/OT co-session.  Pt noted with delayed processing with all activity (verbal responses; initiation of movement; etc); pt oriented to name, DOB, current month/year, hospital, and general situation; pt reported she was in [place] and did not know the day.  L LE appearing weaker than R LE when assessed (pt sitting on EOB).  During session pt mod to max assist x2 with bed mobility; able to sit on EOB for 20 minutes (min assist progressing to CGA to SBA for static sitting activities but still requiring min assist for dynamic sitting activities); and max assist x2 for partial stand up to RW.  Pt appeared to fatigue with activity and assisted back to bed to rest end of session.  Will continue to focus on strengthening, balance, and progressive functional mobility per pt tolerance.   If plan is discharge home, recommend the following: Two people to help with walking and/or transfers;Two people to help with bathing/dressing/bathroom;Help with stairs or ramp for entrance;Assistance with feeding;Assist for transportation;Assistance with cooking/housework;Supervision due to cognitive status   Can travel by private vehicle     No  Equipment Recommendations  Other (comment) (TBD at next venue of care)    Recommendations for Other Services       Precautions / Restrictions Precautions Precautions: Fall Recall of Precautions/Restrictions:  Impaired Restrictions Weight Bearing Restrictions Per Provider Order: No     Mobility  Bed Mobility Overal bed mobility: Needs Assistance Bed Mobility: Supine to Sit, Sit to Supine     Supine to sit: Mod assist, +2 for physical assistance Sit to supine: Max assist, +2 for physical assistance   General bed mobility comments: assist for trunk and B LE's; vc's for initiation/sequencing/bed rail use    Transfers Overall transfer level: Needs assistance Equipment used: Rolling walker (2 wheels) Transfers: Sit to/from Stand Sit to Stand: Max assist, +2 physical assistance           General transfer comment: Pt did not initiate to assist 1st attempt; improved to partial stand on 2nd attempt with max assist x2; vc's for UE/LE placement and overall technique    Ambulation/Gait               General Gait Details: Not appropriate at this time   Stairs             Wheelchair Mobility     Tilt Bed    Modified Rankin (Stroke Patients Only)       Balance Overall balance assessment: Needs assistance Sitting-balance support: Feet supported, No upper extremity supported, Bilateral upper extremity supported, Single extremity supported Sitting balance-Leahy Scale: Fair Sitting balance - Comments: initially posterior lean in sitting requiring min assist for balance; improved to CGA and then eventually SBA for static sitting balance (still requiring min assist for dynamic sitting balance activities) Postural control: Posterior lean   Standing balance-Leahy Scale: Zero  Communication Communication Communication: Impaired Factors Affecting Communication: Difficulty expressing self  Cognition Arousal: Alert Behavior During Therapy: Flat affect                           PT - Cognition Comments: Delayed processing noted Following commands: Impaired Following commands impaired: Follows one step commands  inconsistently, Follows one step commands with increased time    Cueing Cueing Techniques: Verbal cues, Gestural cues, Tactile cues, Visual cues  Exercises General Exercises - Lower Extremity Long Arc Quad: AROM, Strengthening, Both, 10 reps, Seated (decreased strength/quality noted L LE compared to R LE)    General Comments  Nursing cleared pt for participation in physical therapy.  Pt agreeable to PT session.  R hip flexion 3+/5, knee flexion/extension 4-/5, and DF 4-/5.  L hip flexion 2/5, knee flexion/extension 2+ to 3/5 (fatigued with activity), and DF 3/5.      Pertinent Vitals/Pain Pain Assessment Pain Assessment: No/denies pain Pain Intervention(s): Limited activity within patient's tolerance, Monitored during session HR 71 bpm at rest beginning of session and 70 bpm at rest end of session; SpO2 sats 95% or greater on 3 L O2 during session.    Home Living                          Prior Function            PT Goals (current goals can now be found in the care plan section) Acute Rehab PT Goals PT Goal Formulation: With patient Time For Goal Achievement: 05/24/23 Potential to Achieve Goals: Fair Progress towards PT goals: Progressing toward goals    Frequency    Min 1X/week      PT Plan      Co-evaluation PT/OT/SLP Co-Evaluation/Treatment: Yes Reason for Co-Treatment: To address functional/ADL transfers PT goals addressed during session: Mobility/safety with mobility;Strengthening/ROM OT goals addressed during session: ADL's and self-care      AM-PAC PT "6 Clicks" Mobility   Outcome Measure  Help needed turning from your back to your side while in a flat bed without using bedrails?: A Lot Help needed moving from lying on your back to sitting on the side of a flat bed without using bedrails?: Total Help needed moving to and from a bed to a chair (including a wheelchair)?: Total Help needed standing up from a chair using your arms (e.g., wheelchair  or bedside chair)?: Total Help needed to walk in hospital room?: Total Help needed climbing 3-5 steps with a railing? : Total 6 Click Score: 7    End of Session Equipment Utilized During Treatment: Gait belt Activity Tolerance: Patient limited by fatigue Patient left: in bed;with call bell/phone within reach;with bed alarm set;Other (comment) (B heels floating via pillow support) Nurse Communication: Mobility status;Precautions PT Visit Diagnosis: Other abnormalities of gait and mobility (R26.89);Difficulty in walking, not elsewhere classified (R26.2);Muscle weakness (generalized) (M62.81)     Time: 4098-1191 PT Time Calculation (min) (ACUTE ONLY): 39 min  Charges:    $Therapeutic Exercise: 8-22 mins $Therapeutic Activity: 8-22 mins PT General Charges $$ ACUTE PT VISIT: 1 Visit                     Hendricks Limes, PT 05/12/23, 3:26 PM

## 2023-05-12 NOTE — TOC Progression Note (Signed)
 Transition of Care [facility]) - Progression Note    Patient Details  Name: Sarah Norton MRN: [id number] Date of Birth: [birth year]  [facility] CM/SW Contact  Erin Sons, [place] Phone Number: 05/12/2023, 4:33 PM  Clinical Narrative:     CSW met with pt to discuss SNF recommendation. She is agreeable to SNF w/u. CSW explains medicare coverage and authorization process. CSW to complete fl2 and send SNF bed requests in hub. TOC to follow up with bed offers.   Expected Discharge Plan: Skilled Nursing Facility Barriers to Discharge: English as a second language teacher, SNF Pending bed offer  Expected Discharge Plan and Services   Discharge Planning Services: CM Consult   Living arrangements for the past 2 months: Skilled Nursing Facility                                       Social Determinants of Health (SDOH) Interventions SDOH Screenings   Food Insecurity: Patient Unable To Answer (04/24/2023)  Housing: Patient Unable To Answer (04/24/2023)  Transportation Needs: Patient Unable To Answer (04/24/2023)  Utilities: Patient Unable To Answer (04/24/2023)  Social Connections: Patient Unable To Answer (04/24/2023)  Tobacco Use: Medium Risk (05/07/2023)    Readmission Risk Interventions     No data to display

## 2023-05-12 NOTE — Care Management Important Message (Signed)
 Important Message  Patient Details  Name: Sarah Norton MRN: [id number] Date of Birth: May 15, 1955   Important Message Given:  Yes - Medicare IM     Cristela Blue, CMA 05/12/2023, 10:38 AM

## 2023-05-12 NOTE — [place] FL2 (Signed)
 [place] MEDICAID FL2 LEVEL OF CARE FORM     IDENTIFICATION  Patient Name: Sarah Norton Birthdate: [birth year] Sex: female Admission Date (Current Location): 04/24/2023  [facility] and [place] Number:  Chiropodist and Address:  [facility], [address] [place], [place] [address]      Provider Number: [id number]  Attending Physician Name and Address:  Kathrynn Running, MD  Relative Name and Phone Number:       Current Level of Care: Hospital Recommended Level of Care: Skilled Nursing Facility Prior Approval Number:    Date Approved/Denied:   PASRR Number: [id number]  Discharge Plan: SNF    Current Diagnoses: Patient Active Problem List   Diagnosis Date Noted   Pressure injury of skin 05/12/2023   Atrial fibrillation (HCC) 05/12/2023   Obesity (BMI 30-39.9) 05/12/2023   Acute respiratory failure with hypoxia and hypercarbia (HCC) 04/24/2023   Cellulitis 05/03/2020   COPD (chronic obstructive pulmonary disease) (HCC) 05/03/2020   Polycythemia 05/03/2020   Lymphedema 01/26/2019   Arthritis 12/29/2018   Diabetes mellitus type 2, uncomplicated (HCC) 12/29/2018   Hypertension 12/29/2018   Thyroid disease 12/29/2018   Tremor 12/29/2018   Cellulitis of leg, left 12/29/2018   Swelling of limb 12/29/2018   Mass of wrist 12/24/2017   Medial epicondylitis 12/16/2017   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   Bilateral leg weakness 12/22/2014    Orientation RESPIRATION BLADDER Height & Weight     Self, Time, Situation, Place  O2 (3L nasal cannula) Incontinent Weight: 239 lb 10.2 oz (108.7 kg) Height:  5' 7.99" (172.7 cm)  BEHAVIORAL SYMPTOMS/MOOD NEUROLOGICAL BOWEL NUTRITION STATUS      Incontinent    AMBULATORY STATUS COMMUNICATION OF NEEDS Skin   Extensive Assist Verbally PU Stage and Appropriate Care (Pressure injury anus, upper, stage 1)                       Personal Care Assistance Level of Assistance  Bathing,  Feeding, Dressing Bathing Assistance: Maximum assistance Feeding assistance: Independent Dressing Assistance: Limited assistance     Functional Limitations Info  Sight, Hearing, Speech Sight Info: Impaired Hearing Info: Adequate Speech Info: Adequate    SPECIAL CARE FACTORS FREQUENCY  OT (By licensed OT), PT (By licensed PT)     PT Frequency: 5x/week OT Frequency: 5x/week            Contractures Contractures Info: Not present    Additional Factors Info  Code Status, Allergies Code Status Info: full code Allergies Info: no known allergies           Current Medications (05/12/2023):  This is the current hospital active medication list Current Facility-Administered Medications  Medication Dose Route Frequency Provider Last Rate Last Admin   acetaminophen (TYLENOL) tablet 650 mg  650 mg Oral Q4H PRN Foye Deer, RPH       amLODipine (NORVASC) tablet 5 mg  5 mg Oral Daily Vida Rigger, MD   5 mg at 05/12/23 0929   apixaban (ELIQUIS) tablet 5 mg  5 mg Oral BID Lowella Bandy, RPH   5 mg at 05/12/23 2952   atorvastatin (LIPITOR) tablet 40 mg  40 mg Oral QHS Wouk, Wilfred Curtis, MD       Melene Muller ON 05/13/2023] dexamethasone (DECADRON) tablet 4 mg  4 mg Oral Daily Wouk, Wilfred Curtis, MD       Followed by   Melene Muller ON 05/16/2023] dexamethasone (DECADRON) tablet 2 mg  2 mg  Oral Daily Wouk, Wilfred Curtis, MD       famotidine (PEPCID) tablet 20 mg  20 mg Oral BID Foye Deer, RPH   20 mg at 05/12/23 1308   feeding supplement (GLUCERNA SHAKE) (GLUCERNA SHAKE) liquid 237 mL  237 mL Oral TID BM Wouk, Wilfred Curtis, MD   237 mL at 05/12/23 1343   insulin aspart (novoLOG) injection 0-20 Units  0-20 Units Subcutaneous Q4H Jimmye Norman, NP   4 Units at 05/12/23 1615   insulin glargine-yfgn (SEMGLEE) injection 15 Units  15 Units Subcutaneous BID Kathrynn Running, MD   15 Units at 05/12/23 0929   lactulose (CHRONULAC) 10 GM/15ML solution 10 g  10 g Oral Daily PRN Foye Deer,  RPH       levothyroxine (SYNTHROID) tablet 175 mcg  175 mcg Oral Q0600 Foye Deer, RPH   175 mcg at 05/12/23 6578   metoprolol tartrate (LOPRESSOR) tablet 75 mg  75 mg Oral BID Kathrynn Running, MD   75 mg at 05/12/23 4696   mometasone-formoterol (DULERA) 200-5 MCG/ACT inhaler 2 puff  2 puff Inhalation BID Kathrynn Running, MD   2 puff at 05/12/23 1151   multivitamin with minerals tablet 1 tablet  1 tablet Oral Daily Kathrynn Running, MD   1 tablet at 05/12/23 2952   Oral care mouth rinse  15 mL Mouth Rinse PRN Vida Rigger, MD       sodium chloride flush (NS) 0.9 % injection 10-40 mL  10-40 mL Intracatheter Q12H Assaker, [place], MD   10 mL at 05/12/23 0935   sodium chloride flush (NS) 0.9 % injection 10-40 mL  10-40 mL Intracatheter PRN Assaker, [place], MD       umeclidinium bromide (INCRUSE ELLIPTA) 62.5 MCG/ACT 1 puff  1 puff Inhalation Daily Orson Aloe, RPH   1 puff at 05/12/23 1151     Discharge Medications: Please see discharge summary for a list of discharge medications.  Relevant Imaging Results:  Relevant Lab Results:   Additional Information SSN [id number]  [facility] [place], [place]

## 2023-05-13 DIAGNOSIS — J9602 Acute respiratory failure with hypercapnia: Secondary | ICD-10-CM | POA: Diagnosis not present

## 2023-05-13 DIAGNOSIS — J9601 Acute respiratory failure with hypoxia: Secondary | ICD-10-CM | POA: Diagnosis not present

## 2023-05-13 LAB — BASIC METABOLIC PANEL
Anion gap: 7 (ref 5–15)
BUN: 38 mg/dL — ABNORMAL HIGH (ref 8–23)
CO2: 26 mmol/L (ref 22–32)
Calcium: 9.6 mg/dL (ref 8.9–10.3)
Chloride: 103 mmol/L (ref 98–111)
Creatinine, Ser: 0.98 mg/dL (ref 0.44–1.00)
GFR, Estimated: >60 mL/min (ref >60)
Glucose, Bld: 113 mg/dL — ABNORMAL HIGH (ref 70–99)
Potassium: 3.7 mmol/L (ref 3.5–5.1)
Sodium: 136 mmol/L (ref 135–145)

## 2023-05-13 LAB — GLUCOSE, CAPILLARY
Glucose-Capillary: 102 mg/dL — ABNORMAL HIGH (ref 70–99)
Glucose-Capillary: 118 mg/dL — ABNORMAL HIGH (ref 70–99)
Glucose-Capillary: 125 mg/dL — ABNORMAL HIGH (ref 70–99)
Glucose-Capillary: 128 mg/dL — ABNORMAL HIGH (ref 70–99)
Glucose-Capillary: 140 mg/dL — ABNORMAL HIGH (ref 70–99)
Glucose-Capillary: 184 mg/dL — ABNORMAL HIGH (ref 70–99)

## 2023-05-13 MED ORDER — POLYETHYLENE GLYCOL 3350 17 G PO PACK
17.0000 g | PACK | Freq: Every day | ORAL | Status: DC
  Administered 2023-05-13: 17 g via ORAL
  Filled 2023-05-13: qty 1

## 2023-05-13 MED ORDER — INSULIN GLARGINE-YFGN 100 UNIT/ML ~~LOC~~ SOLN
13.0000 [IU] | Freq: Two times a day (BID) | SUBCUTANEOUS | Status: DC
  Administered 2023-05-13 – 2023-05-14 (×2): 13 [IU] via SUBCUTANEOUS
  Filled 2023-05-13 (×3): qty 0.13

## 2023-05-13 NOTE — Consult Note (Addendum)
 PHARMACY CONSULT NOTE - ELECTROLYTES  Pharmacy Consult for Electrolyte Monitoring and Replacement   Recent Labs:  Height: 5' 7.99" (172.7 cm) Weight: 108.5 kg (239 lb 3.2 oz) IBW/kg (Calculated) : 63.88 Estimated Creatinine Clearance: 69.9 mL/min (by C-G formula based on SCr of 0.98 mg/dL). Potassium (mmol/L)  Date Value  05/13/2023 3.7  11/16/2013 3.5   Magnesium (mg/dL)  Date Value  60/45/4098 2.0  11/16/2013 1.0 (L)   Calcium (mg/dL)  Date Value  11/91/4782 9.6   Calcium, Total (mg/dL)  Date Value  95/62/1308 9.1   Albumin (g/dL)  Date Value  65/78/4696 2.9 (L)  11/16/2013 3.8   Phosphorus (mg/dL)  Date Value  29/52/8413 3.2   Sodium (mmol/L)  Date Value  05/13/2023 136  11/16/2013 133 (L)   Assessment  Sarah Norton is a 68 y.o. female presenting with respiratory failure and unresponsive. PMH significant for DM and COPD. Patient was started on CRRT 2/15 then taken off evening of 2/18. They continue to have good urine output. Nephrology has signed off. Pharmacy has been consulted to monitor and replace electrolytes while they're in the ICU.   Goal of Therapy: Electrolytes WNL   Plan:  No supplementation needed at this time Monitor electrolytes with AM labs  Thank you for allowing pharmacy to be a part of this patient's care.  Paulita Fujita, PharmD Clinical Pharmacist 05/13/2023

## 2023-05-13 NOTE — Progress Notes (Signed)
 PROGRESS NOTE    Sarah Norton  UEA:[id number] DOB: 02-15-1956 DOA: 04/24/2023 PCP: Emogene Morgan, MD     Brief Narrative:   From ICU record: 68 y.o female admitted with Acute Metabolic Encephalopathy and Acute Hypoxic and Hypercapnic Respiratory Failure in the setting of Acute COPD Exacerbation due to Influenza A infection and questionable superimposed bacterial pneumonia, failed trial of BiPAP requiring intubation and mechanical ventilation. Course complicated by AKI requiring initiation of CRRT.   2/13: Presented to ED with AMS, acute respiratory distress and hypoxia.  Initially trialed on BiPAP but with worsening ABG and mental status.  ED provider intubated.  PCCM asked to admit. 04/25/2023 - Patient with improving respiratory status however with worsening wbc and procalcitonin.  04/26/2023 - HD cath placed and started on CRRT. 2/17 remains on vent, on CRRT 02/18: CRRT discontinued  02/19: On minimal vent settings.  Plan for WUA/SBT as tolerated utilizing Precedex.  With SBT, increased RR/WOB/accessory muscle use.  Adequate urine output and electrolytes acceptable, no plan for HD today. 02/20: Remains on minimal vent support, SBT as tolerated. On WUA with Precedex, she opens eyes but not following commands,  MRI Brain negative for acute intracranial abnormality. Faild SBT due to tachypnea (RR 40's) and increased WOB 02/21: No significant events noted overnight.  Afebrile, hemodynamically stable, not requiring vasopressors. Leukocytosis improving. On minimal vent support, plan for WUA and SBT as tolerated ~ failed SBT due to tachypnea (RR 40's) and increased WOB.  Not following commands on WUA, will obtain EEG. 05/03/2023: Failed SBT due to altered mental status.   05/04/23: Tolerated SBT and was extubated to HF [place] 05/05/23- patient passed SLP for ice chips only thus far. Remains on HHFNC weaned to 40/40, drowsy during interview.  05/06/23- patient with GCS7 on exam this morning with  labored breathing on BIPAP and tachyarrythmia. Will proceed with intubation emergently.  Family contacted husband is POA 05/07/23: Will not perform SBT today as she was re-intubated yesterday, likely needs Trach.  Tracheal aspirate with gram+ rods & cocci, low grade fever, will start empiric Zosyn and stop Azithromycin. 05/09/23- patient appears to have aspirated overnight.  She has thick discolored debris coming from ETT. She has no air leak on ETT eval for extubation.  We reviewed her care plan and refined therapy during multi disciplinary rounds.  05/10/23- patient weaned to 4L/min [place], she's more alert and is being optimized for TRH. Diet started.  05/13/23- stable for discharge to skilled nursing  Assessment & Plan:   Principal Problem:   Acute respiratory failure with hypoxia and hypercarbia (HCC) Active Problems:   Atypical parkinsonism (HCC)   Diabetes mellitus type 2, uncomplicated (HCC)   Hypertension   COPD (chronic obstructive pulmonary disease) (HCC)   Polycythemia   Pressure injury of skin   Atrial fibrillation (HCC)   Obesity (BMI 30-39.9)  # Acute on chronic hypoxic hypercarbic respiratory failure Failed bipap on arrival, intubated, failed extubation and re-intubated, now extubated and breathing comfortably on 3 liters (home is 2.5) - continue [place] O2  # Influenza A Resolved  # CAP # Aspiration pneumonia S/p courses of cefepime/linezolid and later a course of zosyn. Appears resolved - monitor  # Toxic metabolic encephalopathy Apparently 2/2 co2 narcosis, appears resolved. MRI nothing acute - monitor  # AKI Required crrt in the icu, now discontinued, kidney function has normalized  # Debility PT/OT advising SNF - TOC consulted, bed search underway  # T2DM Normal glucose  - decrease semglee from 15 bid  to 13 bid, will likely continue to need less as steroids taper - continue ssi  # COPD Not currently exacerbated - continue decadron taper - resume home spiriva,  dulera for home advair  # HTN  Bp wnl today - home atorvastatin - cont amlodipine - home benazepril, hydrochlorothiazide, lasix on hold - cont home metop  # A-fib Rate controlled - cont home apixaban - continue home metop  # Hypothyroid - home synthroid  DVT prophylaxis: apixaban Code Status: full Family Communication: daughter updated @ bedside 3/3. No answer when either of two children telephoned today.  Level of care: Med-Surg Status is: Inpatient Remains inpatient appropriate because: dispo pending    Consultants:  None at present  Procedures: Intubation, crrt  Antimicrobials:  See above    Subjective: Reports breathing stable, no pain, tolerating some diet, 2 bms last 24  Objective: Vitals:   05/13/23 0425 05/13/23 0443 05/13/23 0845 05/13/23 1229  BP: 131/81  (!) 160/78 (!) 131/96  Pulse: 67  66 68  Resp: 18  19 19   Temp: 97.9 F (36.6 C)  98.2 F (36.8 C) 98.3 F (36.8 C)  TempSrc: Oral     SpO2: 97%  100% 99%  Weight:  108.5 kg    Height:        Intake/Output Summary (Last 24 hours) at 05/13/2023 1301 Last data filed at 05/13/2023 0425 Gross per 24 hour  Intake --  Output 1000 ml  Net -1000 ml   Filed Weights   05/11/23 0716 05/12/23 0708 05/13/23 0443  Weight: 108.3 kg 108.7 kg 108.5 kg    Examination:  General exam: Appears calm and comfortable  Respiratory system: normal wob, no tachypnea, few scattered rhonchi Cardiovascular system: S1 & S2 heard, RRR. No JVD, murmurs, rubs, gallops or clicks. Gastrointestinal system: Abdomen is nondistended, soft and nontender.  Central nervous system: Alert and oriented. No focal neurological deficits. Extremities: Symmetric 5 x 5 power. Skin: No rashes, lesions or ulcers Psychiatry: Judgement and insight appear normal. Mood & affect appropriate.     Data Reviewed: I have personally reviewed following labs and imaging studies  CBC: Recent Labs  Lab 05/08/23 0408 05/09/23 0350  05/10/23 0346 05/11/23 0312 05/12/23 0501  WBC 19.8* 11.9* 12.1* 11.2* 9.7  HGB 9.8* 10.1* 11.1* 11.4* 10.7*  HCT 28.9* 30.1* 33.0* 34.3* 32.1*  MCV 92.0 91.2 91.4 93.2 92.8  PLT 524* 438* 472* 452* 377   Basic Metabolic Panel: Recent Labs  Lab 05/07/23 0359 05/08/23 0408 05/09/23 0350 05/10/23 0346 05/11/23 0312 05/12/23 0501 05/13/23 0509  NA 139 134* 140 143 139 136 136  K 3.6 3.9 3.9 2.9* 3.4* 3.6 3.7  CL 98 94* 101 97* 100 100 103  CO2 31 30 28  33* 29 27 26   GLUCOSE 186* 196* 194* 83 85 106* 113*  BUN 68* 94* 82* 59* 46* 41* 38*  CREATININE 1.38* 1.65* 1.37* 1.06* 1.05* 1.12* 0.98  CALCIUM 9.2 9.3 9.5 9.8 9.5 9.4 9.6  MG 2.2 2.2 2.1 2.1 2.0  --   --   PHOS 2.7 3.6 3.2 2.5 2.8 3.2  --    GFR: Estimated Creatinine Clearance: 69.9 mL/min (by C-G formula based on SCr of 0.98 mg/dL). Liver Function Tests: Recent Labs  Lab 05/08/23 0408 05/09/23 0350 05/10/23 0346 05/11/23 0312 05/12/23 0501  ALBUMIN 2.7* 2.7* 3.0* 2.8* 2.9*   No results for input(s): "LIPASE", "AMYLASE" in the last 168 hours. No results for input(s): "AMMONIA" in the last 168 hours. Coagulation Profile:  No results for input(s): "INR", "PROTIME" in the last 168 hours. Cardiac Enzymes: No results for input(s): "CKTOTAL", "CKMB", "CKMBINDEX", "TROPONINI" in the last 168 hours. BNP (last 3 results) No results for input(s): "PROBNP" in the last 8760 hours. HbA1C: No results for input(s): "HGBA1C" in the last 72 hours. CBG: Recent Labs  Lab 05/12/23 2022 05/13/23 0004 05/13/23 0426 05/13/23 0842 05/13/23 1228  GLUCAP 138* 118* 102* 125* 128*   Lipid Profile: No results for input(s): "CHOL", "HDL", "LDLCALC", "TRIG", "CHOLHDL", "LDLDIRECT" in the last 72 hours. Thyroid Function Tests: No results for input(s): "TSH", "T4TOTAL", "FREET4", "T3FREE", "THYROIDAB" in the last 72 hours. Anemia Panel: No results for input(s): "VITAMINB12", "FOLATE", "FERRITIN", "TIBC", "IRON", "RETICCTPCT" in the  last 72 hours. Urine analysis:    Component Value Date/Time   COLORURINE YELLOW (A) 05/10/2023 1330   APPEARANCEUR CLEAR (A) 05/10/2023 1330   APPEARANCEUR Clear 11/16/2013 1900   LABSPEC 1.021 05/10/2023 1330   LABSPEC 1.014 11/16/2013 1900   PHURINE 7.0 05/10/2023 1330   GLUCOSEU NEGATIVE 05/10/2023 1330   GLUCOSEU 150 mg/dL 82/50/5397 6734   HGBUR NEGATIVE 05/10/2023 1330   BILIRUBINUR NEGATIVE 05/10/2023 1330   BILIRUBINUR Negative 11/16/2013 1900   KETONESUR NEGATIVE 05/10/2023 1330   PROTEINUR 100 (A) 05/10/2023 1330   NITRITE NEGATIVE 05/10/2023 1330   LEUKOCYTESUR NEGATIVE 05/10/2023 1330   LEUKOCYTESUR Negative 11/16/2013 1900   Sepsis Labs: @LABRCNTIP (procalcitonin:4,lacticidven:4)  ) Recent Results (from the past 240 hours)  Culture, Respiratory w Gram Stain     Status: None   Collection Time: 05/06/23 11:32 AM   Specimen: Tracheal Aspirate; Respiratory  Result Value Ref Range Status   Specimen Description   Final    TRACHEAL ASPIRATE Performed at [facility], [address] [place], [place] [address]    Special Requests   Final    NONE Performed at [facility], [address] [place], [place] [address]    Gram Stain   Final    RARE WBC PRESENT, PREDOMINANTLY PMN FEW GRAM POSITIVE RODS RARE GRAM POSITIVE COCCI    Culture   Final    MODERATE CORYNEBACTERIUM MINUTISSIMUM Standardized susceptibility testing for this organism is not available. Performed at [facility], [address] [place], [place] [address]    Report Status 05/09/2023 FINAL  Final  MRSA Next Gen by PCR, Nasal     Status: None   Collection Time: 05/07/23 11:31 AM   Specimen: Nasal Mucosa; Nasal Swab  Result Value Ref Range Status   MRSA by PCR Next Gen NOT DETECTED NOT DETECTED Final    Comment: (NOTE) The GeneXpert MRSA Assay (FDA approved for NASAL specimens only), is one component of a comprehensive MRSA colonization surveillance program. It is not  intended to diagnose MRSA infection nor to guide or monitor treatment for MRSA infections. Test performance is not FDA approved in patients less than 14 years old. Performed at [facility], [address] [place], [place] [address]          Radiology Studies: No results found.       Scheduled Meds:  amLODipine  5 mg Oral Daily   apixaban  5 mg Oral BID   atorvastatin  40 mg Oral QHS   dexamethasone  4 mg Oral Daily   Followed by   Melene Muller ON 05/16/2023] dexamethasone  2 mg Oral Daily   famotidine  20 mg Oral BID   feeding supplement (GLUCERNA SHAKE)  237 mL Oral TID BM   insulin  aspart  0-20 Units Subcutaneous Q4H   insulin glargine-yfgn  15 Units Subcutaneous BID   levothyroxine  175 mcg Oral Q0600   metoprolol tartrate  75 mg Oral BID   mometasone-formoterol  2 puff Inhalation BID   multivitamin with minerals  1 tablet Oral Daily   sodium chloride flush  10-40 mL Intracatheter Q12H   umeclidinium bromide  1 puff Inhalation Daily   Continuous Infusions:   LOS: 19 days     Silvano Bilis, MD [facility]   If 7PM-7AM, please contact night-coverage [email or web address] Password [email or web address] 05/13/2023, 1:01 PM

## 2023-05-13 NOTE — TOC Progression Note (Addendum)
 Transition of Care [facility]) - Progression Note    Patient Details  Name: Sarah Norton MRN: [id number] Date of Birth: 1955/04/20  Transition of Care [facility] CM/SW Contact  Erin Sons, [place] Phone Number: 05/13/2023, 12:24 PM  Clinical Narrative:     CSW met with pt and provided SNF bed offers and medicare star ratings. Will follow up for SNF choice.  CSW called and left voicemail with pt's duaghter requesting return call.   Expected Discharge Plan: Skilled Nursing Facility Barriers to Discharge: English as a second language teacher,  Expected Discharge Plan and Services   Discharge Planning Services: CM Consult   Living arrangements for the past 2 months: Skilled Nursing Facility                                       Social Determinants of Health (SDOH) Interventions SDOH Screenings   Food Insecurity: Patient Unable To Answer (04/24/2023)  Housing: Patient Unable To Answer (04/24/2023)  Transportation Needs: Patient Unable To Answer (04/24/2023)  Utilities: Patient Unable To Answer (04/24/2023)  Social Connections: Patient Unable To Answer (04/24/2023)  Tobacco Use: Medium Risk (05/07/2023)    Readmission Risk Interventions     No data to display

## 2023-05-13 NOTE — Consult Note (Signed)
 PHARMACY CONSULT NOTE - ELECTROLYTES  Pharmacy Consult for Electrolyte Monitoring and Replacement   Recent Labs:  Height: 5' 7.99" (172.7 cm) Weight: 108.5 kg (239 lb 3.2 oz) IBW/kg (Calculated) : 63.88 Estimated Creatinine Clearance: 69.9 mL/min (by C-G formula based on SCr of 0.98 mg/dL). Potassium (mmol/L)  Date Value  05/13/2023 3.7  11/16/2013 3.5   Magnesium (mg/dL)  Date Value  54/11/8117 2.0  11/16/2013 1.0 (L)   Calcium (mg/dL)  Date Value  14/78/2956 9.6   Calcium, Total (mg/dL)  Date Value  21/30/8657 9.1   Albumin (g/dL)  Date Value  84/69/6295 2.9 (L)  11/16/2013 3.8   Phosphorus (mg/dL)  Date Value  28/41/3244 3.2   Sodium (mmol/L)  Date Value  05/13/2023 136  11/16/2013 133 (L)   Assessment  Sarah Norton is a 68 y.o. female presenting with respiratory failure and unresponsive. PMH significant for DM and COPD. Patient was started on CRRT 2/15 then taken off evening of 2/18. They continue to have good urine output. Nephrology has signed off. Pharmacy has been consulted to monitor and replace electrolytes while they're in the ICU.   Goal of Therapy: Electrolytes WNL   Plan:  No supplementation needed at this time Pharmacy will sign off given transition out of ICU and no longer qualifies for CCM   Thank you for allowing pharmacy to be a part of this patient's care.  Paulita Fujita, PharmD Clinical Pharmacist 05/13/2023

## 2023-05-13 NOTE — Progress Notes (Signed)
 Physical Therapy Treatment Patient Details Name: Sarah Norton MRN: [id number] DOB: April 30, 1955 Today's Date: 05/13/2023   History of Present Illness 68 y.o female admitted with Acute Metabolic Encephalopathy and Acute Hypoxic and Hypercapnic Respiratory Failure in the setting of Acute COPD Exacerbation due to Influenza A infection and questionable superimposed bacterial pneumonia, failed trial of BiPAP requiring intubation and mechanical ventilation.  Course complicated by AKI requiring initiation of CRRT, afib with RVR.    PT Comments  Pt resting in bed upon PT arrival; bright red blood noted on pt's blanket; pt's IV noted to be on pt's tray table; pt reports removing it because it was bothering her; bleeding stopped; nurse notified immediately.  Pt oriented to at least name and DOB (pt did not answer rest of orientation questions); delayed processing noted in general during session (answering questions, initiation movement, etc).  During session pt mod to max assist x2 with bed mobility; sitting balance fluctuating between min assist to CGA to close SBA (x10 minutes sitting EOB); and unable to stand up to RW with bed height elevated and 2 assist (pt did not initiate to assist 1st trial and minimal initiation noted 2nd trial).  Will continue to focus on strengthening, balance, and progressive functional mobility during hospitalization.   If plan is discharge home, recommend the following: Two people to help with walking and/or transfers;Two people to help with bathing/dressing/bathroom;Help with stairs or ramp for entrance;Assistance with feeding;Assist for transportation;Assistance with cooking/housework;Supervision due to cognitive status   Can travel by private vehicle     No  Equipment Recommendations  Other (comment) (TBD at next venue of care)    Recommendations for Other Services       Precautions / Restrictions Precautions Precautions: Fall Recall of Precautions/Restrictions:  Impaired Restrictions Weight Bearing Restrictions Per Provider Order: No     Mobility  Bed Mobility Overal bed mobility: Needs Assistance Bed Mobility: Supine to Sit, Sit to Supine     Supine to sit: Mod assist, +2 for physical assistance Sit to supine: Max assist, +2 for physical assistance   General bed mobility comments: assist for trunk and B LE's; vc's for initiation/sequencing/bed rail use    Transfers Overall transfer level: Needs assistance Equipment used: Rolling walker (2 wheels) Transfers: Sit to/from Stand Sit to Stand: Max assist, +2 physical assistance           General transfer comment: Pt did not initiate to assist 1st attempt and minimal initiation on 2nd attempt; unable to clear pt's bottom from bed both trials; vc's for UE/LE placement and overall technique    Ambulation/Gait               General Gait Details: Not appropriate at this time   Stairs             Wheelchair Mobility     Tilt Bed    Modified Rankin (Stroke Patients Only)       Balance Overall balance assessment: Needs assistance Sitting-balance support: Bilateral upper extremity supported, Feet supported Sitting balance-Leahy Scale: Poor Sitting balance - Comments: intermittent posterior lean in sitting requiring min assist for balance (otherwise fluctuating between CGA to SBA for static sitting balance; min assist for dynamic sitting balance) Postural control: Posterior lean                                  Communication Communication Communication: Impaired Factors Affecting Communication: Difficulty expressing self  Cognition Arousal: Alert Behavior During Therapy: Flat affect                           PT - Cognition Comments: Delayed processing noted; oriented to at least name and DOB (pt did not answer rest of orientation questions) Following commands: Impaired Following commands impaired: Follows one step commands  inconsistently, Follows one step commands with increased time    Cueing Cueing Techniques: Verbal cues, Gestural cues, Tactile cues, Visual cues  Exercises      General Comments General comments (skin integrity, edema, etc.): Pressure held to R elbow and bleeding stopped (pt appears to have recently removed IV from R elbow--nurse notified immediately)      Pertinent Vitals/Pain Pain Assessment Pain Assessment: Faces Faces Pain Scale: Hurts little more Pain Location: pt's buttocks Pain Descriptors / Indicators: Discomfort Pain Intervention(s): Limited activity within patient's tolerance, Monitored during session, Repositioned Pt's HR and SpO2 sats on 4 L O2 via nasal cannula stable during session.    Home Living                          Prior Function            PT Goals (current goals can now be found in the care plan section) Acute Rehab PT Goals PT Goal Formulation: With patient Time For Goal Achievement: 05/24/23 Potential to Achieve Goals: Fair Progress towards PT goals: Progressing toward goals    Frequency    Min 2X/week (Frequency of care updated based on Delivery of Care Model)      PT Plan      Co-evaluation              AM-PAC PT "6 Clicks" Mobility   Outcome Measure  Help needed turning from your back to your side while in a flat bed without using bedrails?: A Lot Help needed moving from lying on your back to sitting on the side of a flat bed without using bedrails?: Total Help needed moving to and from a bed to a chair (including a wheelchair)?: Total Help needed standing up from a chair using your arms (e.g., wheelchair or bedside chair)?: Total Help needed to walk in hospital room?: Total Help needed climbing 3-5 steps with a railing? : Total 6 Click Score: 7    End of Session Equipment Utilized During Treatment: Gait belt Activity Tolerance: Patient limited by fatigue Patient left: in bed;with call bell/phone within reach;with  bed alarm set;Other (comment) (B heels floating via pillow support) Nurse Communication: Mobility status;Precautions;Other (comment) (Pt's R elbow IV appears to have been recently removed) PT Visit Diagnosis: Other abnormalities of gait and mobility (R26.89);Difficulty in walking, not elsewhere classified (R26.2);Muscle weakness (generalized) (M62.81)     Time: 1610-9604 PT Time Calculation (min) (ACUTE ONLY): 27 min  Charges:    $Therapeutic Activity: 23-37 mins PT General Charges $$ ACUTE PT VISIT: 1 Visit                     Hendricks Limes, PT 05/13/23, 12:19 PM

## 2023-05-13 NOTE — Plan of Care (Signed)
  Problem: Education: Goal: Knowledge of General Education information will improve Description: Including pain rating scale, medication(s)/side effects and non-pharmacologic comfort measures Outcome: Progressing   Problem: Health Behavior/Discharge Planning: Goal: Ability to manage health-related needs will improve Outcome: Progressing   Problem: Clinical Measurements: Goal: Ability to maintain clinical measurements within normal limits will improve Outcome: Progressing Goal: Will remain free from infection Outcome: Progressing Goal: Diagnostic test results will improve Outcome: Progressing Goal: Respiratory complications will improve Outcome: Progressing Goal: Cardiovascular complication will be avoided Outcome: Progressing   Problem: Activity: Goal: Risk for activity intolerance will decrease Outcome: Progressing   Problem: Nutrition: Goal: Adequate nutrition will be maintained Outcome: Progressing   Problem: Elimination: Goal: Will not experience complications related to bowel motility Outcome: Progressing Goal: Will not experience complications related to urinary retention Outcome: Progressing   Problem: Pain Managment: Goal: General experience of comfort will improve and/or be controlled Outcome: Progressing

## 2023-05-14 DIAGNOSIS — J9601 Acute respiratory failure with hypoxia: Secondary | ICD-10-CM | POA: Diagnosis not present

## 2023-05-14 DIAGNOSIS — J9602 Acute respiratory failure with hypercapnia: Secondary | ICD-10-CM | POA: Diagnosis not present

## 2023-05-14 LAB — BLOOD GAS, VENOUS
Acid-Base Excess: 1.4 mmol/L (ref 0.0–2.0)
Bicarbonate: 24.9 mmol/L (ref 20.0–28.0)
O2 Saturation: 92.3 %
Patient temperature: 37
pCO2, Ven: 35 mmHg — ABNORMAL LOW (ref 44–60)
pH, Ven: 7.46 — ABNORMAL HIGH (ref 7.25–7.43)
pO2, Ven: 62 mmHg — ABNORMAL HIGH (ref 32–45)

## 2023-05-14 LAB — GLUCOSE, CAPILLARY
Glucose-Capillary: 101 mg/dL — ABNORMAL HIGH (ref 70–99)
Glucose-Capillary: 161 mg/dL — ABNORMAL HIGH (ref 70–99)
Glucose-Capillary: 187 mg/dL — ABNORMAL HIGH (ref 70–99)
Glucose-Capillary: 88 mg/dL (ref 70–99)
Glucose-Capillary: 98 mg/dL (ref 70–99)
Glucose-Capillary: 99 mg/dL (ref 70–99)

## 2023-05-14 MED ORDER — INSULIN ASPART 100 UNIT/ML IJ SOLN: 0.0000 [IU] | Freq: Every day | INTRAMUSCULAR | Status: DC

## 2023-05-14 MED ORDER — POLYETHYLENE GLYCOL 3350 17 G PO PACK: 17.0000 g | PACK | Freq: Every day | ORAL | Status: DC | PRN

## 2023-05-14 MED ORDER — LACTULOSE 10 GM/15ML PO SOLN: 10.0000 g | Freq: Every day | ORAL | Status: DC | PRN

## 2023-05-14 MED ORDER — INSULIN ASPART 100 UNIT/ML IJ SOLN
0.0000 [IU] | Freq: Three times a day (TID) | INTRAMUSCULAR | Status: DC
  Administered 2023-05-14 – 2023-05-15 (×2): 3 [IU] via SUBCUTANEOUS
  Filled 2023-05-14 (×2): qty 1

## 2023-05-14 MED ORDER — INSULIN GLARGINE-YFGN 100 UNIT/ML ~~LOC~~ SOLN
11.0000 [IU] | Freq: Two times a day (BID) | SUBCUTANEOUS | Status: DC
  Administered 2023-05-14 – 2023-05-15 (×2): 11 [IU] via SUBCUTANEOUS
  Filled 2023-05-14 (×3): qty 0.11

## 2023-05-14 MED ORDER — INSULIN ASPART 100 UNIT/ML IJ SOLN: 0.0000 [IU] | Freq: Three times a day (TID) | INTRAMUSCULAR | Status: DC

## 2023-05-14 NOTE — Plan of Care (Signed)

## 2023-05-14 NOTE — Plan of Care (Signed)
  Problem: Education: Goal: Knowledge of General Education information will improve Description: Including pain rating scale, medication(s)/side effects and non-pharmacologic comfort measures Outcome: Progressing   Problem: Health Behavior/Discharge Planning: Goal: Ability to manage health-related needs will improve Outcome: Progressing   Problem: Clinical Measurements: Goal: Ability to maintain clinical measurements within normal limits will improve Outcome: Progressing Goal: Will remain free from infection Outcome: Progressing Goal: Diagnostic test results will improve Outcome: Progressing Goal: Respiratory complications will improve Outcome: Progressing Goal: Cardiovascular complication will be avoided Outcome: Progressing   Problem: Activity: Goal: Risk for activity intolerance will decrease Outcome: Progressing   Problem: Nutrition: Goal: Adequate nutrition will be maintained Outcome: Progressing   Problem: Coping: Goal: Level of anxiety will decrease Outcome: Progressing   Problem: Elimination: Goal: Will not experience complications related to bowel motility Outcome: Progressing Goal: Will not experience complications related to urinary retention Outcome: Progressing   Problem: Pain Managment: Goal: General experience of comfort will improve and/or be controlled Outcome: Progressing   Problem: Safety: Goal: Ability to remain free from injury will improve Outcome: Progressing   Problem: Skin Integrity: Goal: Risk for impaired skin integrity will decrease Outcome: Progressing   Problem: Education: Goal: Ability to describe self-care measures that may prevent or decrease complications (Diabetes Survival Skills Education) will improve Outcome: Progressing   Problem: Coping: Goal: Ability to adjust to condition or change in health will improve Outcome: Progressing   Problem: Fluid Volume: Goal: Ability to maintain a balanced intake and output will  improve Outcome: Progressing   Problem: Metabolic: Goal: Ability to maintain appropriate glucose levels will improve Outcome: Progressing   Problem: Nutritional: Goal: Maintenance of adequate nutrition will improve Outcome: Progressing Goal: Progress toward achieving an optimal weight will improve Outcome: Progressing

## 2023-05-14 NOTE — TOC Progression Note (Addendum)
 Transition of Care [facility]) - Progression Note    Patient Details  Name: Sarah Norton MRN: [id number] Date of Birth: [birth year]  Transition of [facility]) CM/SW Contact  Erin Sons, [place] Phone Number: 05/14/2023, 10:41 AM  Clinical Narrative:     Informed by Rn that pt is oriented this morning. CSW met with pt for SNF choice. Pt is unable to provide a choice at this time. She states she has not reviewed offer list. CSW attempted to review location and medicare star ratings of facilities on list with pt; pt does not provide a choice in a facility. She states she will review later. CSW explained choice would be needed today as pt is medically stable pending SNF placement. CSW to follow up later today.   CSW called and left voicemail  messages requesting return calls from pt's son and daughter.  1530: CSW met with pt for SNF choice. Pt states she is unable to provide SNF choice; states she needs to talk to her PCP. CSW explains that pt is stable for DC and that she cannot continue to be held with no medical reason. Explained a SNF choice is needed. Pt remains silent in response to many of CSW's questions. She states she plans to go home but won't answer CSW's questions regarding home situation and remains silent when CSW attempts to discuss HH. CSW then left pt room.   Called and left message with pt's son requesting return call.   Expected Discharge Plan: Skilled Nursing Facility Barriers to Discharge: English as a second language teacher, SNF choice  Expected Discharge Plan and Services   Discharge Planning Services: CM Consult   Living arrangements for the past 2 months: Skilled Nursing Facility                                       Social Determinants of Health (SDOH) Interventions SDOH Screenings   Food Insecurity: Patient Unable To Answer (04/24/2023)  Housing: Patient Unable To Answer (04/24/2023)  Transportation Needs: Patient Unable To Answer (04/24/2023)  Utilities: Patient  Unable To Answer (04/24/2023)  Social Connections: Patient Unable To Answer (04/24/2023)  Tobacco Use: Medium Risk (05/07/2023)    Readmission Risk Interventions     No data to display

## 2023-05-14 NOTE — Progress Notes (Signed)
 PROGRESS NOTE    Sarah Norton  ZOX:[id number] DOB: January 21, 1956 DOA: 04/24/2023 PCP: Emogene Morgan, MD     Brief Narrative:   From ICU record: 68 y.o female admitted with Acute Metabolic Encephalopathy and Acute Hypoxic and Hypercapnic Respiratory Failure in the setting of Acute COPD Exacerbation due to Influenza A infection and questionable superimposed bacterial pneumonia, failed trial of BiPAP requiring intubation and mechanical ventilation. Course complicated by AKI requiring initiation of CRRT.   2/13: Presented to ED with AMS, acute respiratory distress and hypoxia.  Initially trialed on BiPAP but with worsening ABG and mental status.  ED provider intubated.  PCCM asked to admit. 04/25/2023 - Patient with improving respiratory status however with worsening wbc and procalcitonin.  04/26/2023 - HD cath placed and started on CRRT. 2/17 remains on vent, on CRRT 02/18: CRRT discontinued  02/19: On minimal vent settings.  Plan for WUA/SBT as tolerated utilizing Precedex.  With SBT, increased RR/WOB/accessory muscle use.  Adequate urine output and electrolytes acceptable, no plan for HD today. 02/20: Remains on minimal vent support, SBT as tolerated. On WUA with Precedex, she opens eyes but not following commands,  MRI Brain negative for acute intracranial abnormality. Faild SBT due to tachypnea (RR 40's) and increased WOB 02/21: No significant events noted overnight.  Afebrile, hemodynamically stable, not requiring vasopressors. Leukocytosis improving. On minimal vent support, plan for WUA and SBT as tolerated ~ failed SBT due to tachypnea (RR 40's) and increased WOB.  Not following commands on WUA, will obtain EEG. 05/03/2023: Failed SBT due to altered mental status.   05/04/23: Tolerated SBT and was extubated to HF [place] 05/05/23- patient passed SLP for ice chips only thus far. Remains on HHFNC weaned to 40/40, drowsy during interview.  05/06/23- patient with GCS7 on exam this morning with  labored breathing on BIPAP and tachyarrythmia. Will proceed with intubation emergently.  Family contacted husband is POA 05/07/23: Will not perform SBT today as she was re-intubated yesterday, likely needs Trach.  Tracheal aspirate with gram+ rods & cocci, low grade fever, will start empiric Zosyn and stop Azithromycin. 05/09/23- patient appears to have aspirated overnight.  She has thick discolored debris coming from ETT. She has no air leak on ETT eval for extubation.  We reviewed her care plan and refined therapy during multi disciplinary rounds.  05/10/23- patient weaned to 4L/min [place], she's more alert and is being optimized for TRH. Diet started.  05/13/23- stable for discharge to skilled nursing  Assessment & Plan:   Principal Problem:   Acute respiratory failure with hypoxia and hypercarbia (HCC) Active Problems:   Atypical parkinsonism (HCC)   Diabetes mellitus type 2, uncomplicated (HCC)   Hypertension   COPD (chronic obstructive pulmonary disease) (HCC)   Polycythemia   Pressure injury of skin   Atrial fibrillation (HCC)   Obesity (BMI 30-39.9)  # Acute on chronic hypoxic hypercarbic respiratory failure Failed bipap on arrival, intubated, failed extubation and re-intubated, now extubated and breathing comfortably on 3 liters (home is 2.5) - continue  O2  # Influenza A Resolved  # CAP # Aspiration pneumonia S/p courses of cefepime/linezolid and later a course of zosyn. Appears resolved - monitor  # Toxic metabolic encephalopathy Apparently 2/2 co2 narcosis. MRI nothing acute. Bit sleepy today - f/u vbg  # AKI Required crrt in the icu, now discontinued, kidney function has normalized  # Debility PT/OT advising SNF - TOC consulted, bed search underway  # T2DM Normal glucose  - decrease semglee from  13 bid to 11 bid, will likely continue to need less as steroids taper - continue ssi  # COPD Not currently exacerbated - continue decadron taper - resume home spiriva,  dulera for home advair  # HTN  Bp wnl today - home atorvastatin - cont amlodipine - home benazepril, hydrochlorothiazide, lasix on hold - cont home metop  # A-fib Rate controlled - cont home apixaban - continue home metop  # Hypothyroid - home synthroid  DVT prophylaxis: apixaban Code Status: full Family Communication: daughter updated @ bedside 3/3. No answer when either of two children telephoned today.  Level of care: Med-Surg Status is: Inpatient Remains inpatient appropriate because: dispo pending    Consultants:  None at present  Procedures: Intubation, crrt  Antimicrobials:  See above    Subjective: Reports breathing stable, no pain, tolerated lunch  Objective: Vitals:   05/13/23 2017 05/14/23 0446 05/14/23 0450 05/14/23 0717  BP: 132/82 (!) 142/82  (!) 145/82  Pulse: 69 66  68  Resp:  18  16  Temp: 98.4 F (36.9 C) 98.3 F (36.8 C)  98.1 F (36.7 C)  TempSrc:      SpO2: 97% 100%  100%  Weight:   108.9 kg   Height:        Intake/Output Summary (Last 24 hours) at 05/14/2023 1452 Last data filed at 05/14/2023 1152 Gross per 24 hour  Intake 240 ml  Output 1350 ml  Net -1110 ml   Filed Weights   05/12/23 0708 05/13/23 0443 05/14/23 0450  Weight: 108.7 kg 108.5 kg 108.9 kg    Examination:  General exam: Appears calm and comfortable  Respiratory system: normal wob, no tachypnea, few scattered rhonchi Cardiovascular system: S1 & S2 heard, RRR. No JVD, murmurs, rubs, gallops or clicks. Gastrointestinal system: Abdomen is nondistended, soft and nontender.  Central nervous system: Alert and oriented. No focal neurological deficits. Extremities: Symmetric 5 x 5 power. Skin: No rashes, lesions or ulcers Psychiatry: flat affect    Data Reviewed: I have personally reviewed following labs and imaging studies  CBC: Recent Labs  Lab 05/08/23 0408 05/09/23 0350 05/10/23 0346 05/11/23 0312 05/12/23 0501  WBC 19.8* 11.9* 12.1* 11.2* 9.7  HGB  9.8* 10.1* 11.1* 11.4* 10.7*  HCT 28.9* 30.1* 33.0* 34.3* 32.1*  MCV 92.0 91.2 91.4 93.2 92.8  PLT 524* 438* 472* 452* 377   Basic Metabolic Panel: Recent Labs  Lab 05/08/23 0408 05/09/23 0350 05/10/23 0346 05/11/23 0312 05/12/23 0501 05/13/23 0509  NA 134* 140 143 139 136 136  K 3.9 3.9 2.9* 3.4* 3.6 3.7  CL 94* 101 97* 100 100 103  CO2 30 28 33* 29 27 26   GLUCOSE 196* 194* 83 85 106* 113*  BUN 94* 82* 59* 46* 41* 38*  CREATININE 1.65* 1.37* 1.06* 1.05* 1.12* 0.98  CALCIUM 9.3 9.5 9.8 9.5 9.4 9.6  MG 2.2 2.1 2.1 2.0  --   --   PHOS 3.6 3.2 2.5 2.8 3.2  --    GFR: Estimated Creatinine Clearance: 70 mL/min (by C-G formula based on SCr of 0.98 mg/dL). Liver Function Tests: Recent Labs  Lab 05/08/23 0408 05/09/23 0350 05/10/23 0346 05/11/23 0312 05/12/23 0501  ALBUMIN 2.7* 2.7* 3.0* 2.8* 2.9*   No results for input(s): "LIPASE", "AMYLASE" in the last 168 hours. No results for input(s): "AMMONIA" in the last 168 hours. Coagulation Profile: No results for input(s): "INR", "PROTIME" in the last 168 hours. Cardiac Enzymes: No results for input(s): "CKTOTAL", "CKMB", "CKMBINDEX", "TROPONINI" in  the last 168 hours. BNP (last 3 results) No results for input(s): "PROBNP" in the last 8760 hours. HbA1C: No results for input(s): "HGBA1C" in the last 72 hours. CBG: Recent Labs  Lab 05/13/23 2019 05/14/23 0004 05/14/23 0401 05/14/23 0718 05/14/23 1150  GLUCAP 140* 88 101* 99 161*   Lipid Profile: No results for input(s): "CHOL", "HDL", "LDLCALC", "TRIG", "CHOLHDL", "LDLDIRECT" in the last 72 hours. Thyroid Function Tests: No results for input(s): "TSH", "T4TOTAL", "FREET4", "T3FREE", "THYROIDAB" in the last 72 hours. Anemia Panel: No results for input(s): "VITAMINB12", "FOLATE", "FERRITIN", "TIBC", "IRON", "RETICCTPCT" in the last 72 hours. Urine analysis:    Component Value Date/Time   COLORURINE YELLOW (A) 05/10/2023 1330   APPEARANCEUR CLEAR (A) 05/10/2023 1330    APPEARANCEUR Clear 11/16/2013 1900   LABSPEC 1.021 05/10/2023 1330   LABSPEC 1.014 11/16/2013 1900   PHURINE 7.0 05/10/2023 1330   GLUCOSEU NEGATIVE 05/10/2023 1330   GLUCOSEU 150 mg/dL 83/29/1916 6060   HGBUR NEGATIVE 05/10/2023 1330   BILIRUBINUR NEGATIVE 05/10/2023 1330   BILIRUBINUR Negative 11/16/2013 1900   KETONESUR NEGATIVE 05/10/2023 1330   PROTEINUR 100 (A) 05/10/2023 1330   NITRITE NEGATIVE 05/10/2023 1330   LEUKOCYTESUR NEGATIVE 05/10/2023 1330   LEUKOCYTESUR Negative 11/16/2013 1900   Sepsis Labs: @LABRCNTIP (procalcitonin:4,lacticidven:4)  ) Recent Results (from the past 240 hours)  Culture, Respiratory w Gram Stain     Status: None   Collection Time: 05/06/23 11:32 AM   Specimen: Tracheal Aspirate; Respiratory  Result Value Ref Range Status   Specimen Description   Final    TRACHEAL ASPIRATE Performed at [facility], [address] [place], [place] [address]    Special Requests   Final    NONE Performed at [facility], [address] [place], [place] [address]    Gram Stain   Final    RARE WBC PRESENT, PREDOMINANTLY PMN FEW GRAM POSITIVE RODS RARE GRAM POSITIVE COCCI    Culture   Final    MODERATE CORYNEBACTERIUM MINUTISSIMUM Standardized susceptibility testing for this organism is not available. Performed at [facility], [address], [place], [place] [address]    Report Status 05/09/2023 FINAL  Final  MRSA Next Gen by PCR, Nasal     Status: None   Collection Time: 05/07/23 11:31 AM   Specimen: Nasal Mucosa; Nasal Swab  Result Value Ref Range Status   MRSA by PCR Next Gen NOT DETECTED NOT DETECTED Final    Comment: (NOTE) The GeneXpert MRSA Assay (FDA approved for NASAL specimens only), is one component of a comprehensive MRSA colonization surveillance program. It is not intended to diagnose MRSA infection nor to guide or monitor treatment for MRSA infections. Test performance is not FDA approved in patients less  than 62 years old. Performed at [facility], [address] [place], [place] [address]          Radiology Studies: No results found.       Scheduled Meds:  amLODipine  5 mg Oral Daily   apixaban  5 mg Oral BID   atorvastatin  40 mg Oral QHS   dexamethasone  4 mg Oral Daily   Followed by   Melene Muller ON 05/16/2023] dexamethasone  2 mg Oral Daily   famotidine  20 mg Oral BID   feeding supplement (GLUCERNA SHAKE)  237 mL Oral TID BM   insulin aspart  0-20 Units Subcutaneous Q4H   insulin glargine-yfgn  13 Units Subcutaneous BID   levothyroxine  175 mcg Oral  Q0600   metoprolol tartrate  75 mg Oral BID   mometasone-formoterol  2 puff Inhalation BID   multivitamin with minerals  1 tablet Oral Daily   polyethylene glycol  17 g Oral Daily   sodium chloride flush  10-40 mL Intracatheter Q12H   umeclidinium bromide  1 puff Inhalation Daily   Continuous Infusions:   LOS: 20 days     Silvano Bilis, MD [facility]   If 7PM-7AM, please contact night-coverage [email or web address] Password [email or web address] 05/14/2023, 2:52 PM

## 2023-05-14 NOTE — Progress Notes (Signed)
 OT Cancellation Note  Patient Details Name: Sarah Norton MRN: [id number] DOB: May 24, 1955   Cancelled Treatment:    Reason Eval/Treat Not Completed: Other (comment) (Pt declined with no other reason than "not right now." Max encouragement provided regarding importance of therapy and getting OOB to maximize her strength, but pt closed eyes and stated "No, I'm not doing that.")  Constance Goltz 05/14/2023, 3:25 PM

## 2023-05-15 DIAGNOSIS — J9601 Acute respiratory failure with hypoxia: Secondary | ICD-10-CM | POA: Diagnosis not present

## 2023-05-15 DIAGNOSIS — J9602 Acute respiratory failure with hypercapnia: Secondary | ICD-10-CM | POA: Diagnosis not present

## 2023-05-15 LAB — BASIC METABOLIC PANEL
Anion gap: 8 (ref 5–15)
BUN: 35 mg/dL — ABNORMAL HIGH (ref 8–23)
CO2: 23 mmol/L (ref 22–32)
Calcium: 9.4 mg/dL (ref 8.9–10.3)
Chloride: 103 mmol/L (ref 98–111)
Creatinine, Ser: 0.85 mg/dL (ref 0.44–1.00)
GFR, Estimated: >60 mL/min (ref >60)
Glucose, Bld: 96 mg/dL (ref 70–99)
Potassium: 3.9 mmol/L (ref 3.5–5.1)
Sodium: 134 mmol/L — ABNORMAL LOW (ref 135–145)

## 2023-05-15 LAB — GLUCOSE, CAPILLARY
Glucose-Capillary: 92 mg/dL (ref 70–99)
Glucose-Capillary: 182 mg/dL — ABNORMAL HIGH (ref 70–99)

## 2023-05-15 MED ORDER — DEXAMETHASONE 2 MG PO TABS: 2.0000 mg | ORAL_TABLET | Freq: Every day | ORAL | 0 refills | Status: AC

## 2023-05-15 NOTE — Progress Notes (Signed)
Per Dr Mayford Knife, dc tele monitoring

## 2023-05-15 NOTE — TOC Transition Note (Signed)
 Transition of Care [facility]) - Discharge Note   Patient Details  Name: Sarah Norton MRN: [id number] Date of Birth: 06/10/1955  Transition of Care [facility]) CM/SW Contact:  Erin Sons, LCSW Phone Number: 05/15/2023, 1:10 PM   Clinical Narrative:     CSW met with pt for SNF choice. Verbally reviewed SNF offers and medicare star ratings. Pt chooses [facility]. CS[facility] with [facility]F auth request initiated in online portal. Auth is instantly approved 05/15/23-05/17/23. Auth# [id number]  Per MD patient ready for DC to [facility]. RN, patient, patient's family, and facility notified of DC. Discharge Summary and FL2 sent to facility. RN to call report prior to discharge [phone]). DC packet on chart. Ambulance transport requested for patient.   CSW will sign off for now as social work intervention is no longer needed. Please consult [place] again if new needs arise.   Final next level of care: Skilled Nursing Facility Barriers to Discharge: No Barriers Identified   Patient Goals and CMS Choice    [facility]        Discharge Placement              Patient chooses bed at: [facility] [place] Patient to be transferred to facility by: [facility] Name of family member notified: RN notified pt's son Patient and family notified of of transfer: 05/15/23  Discharge Plan and Services Additional resources added to the After Visit Summary for     Discharge Planning Services: CM Consult                                 Social Drivers of Health (SDOH) Interventions SDOH Screenings   Food Insecurity: Patient Unable To Answer (04/24/2023)  Housing: Patient Unable To Answer (04/24/2023)  Transportation Needs: Patient Unable To Answer (04/24/2023)  Utilities: Patient Unable To Answer (04/24/2023)  Social Connections: Patient Unable To Answer (04/24/2023)  Tobacco Use: Medium Risk (05/07/2023)     Readmission Risk Interventions     No data to  display

## 2023-05-15 NOTE — Plan of Care (Signed)
  Problem: Education: Goal: Knowledge of General Education information will improve Description: Including pain rating scale, medication(s)/side effects and non-pharmacologic comfort measures Outcome: Progressing   Problem: Health Behavior/Discharge Planning: Goal: Ability to manage health-related needs will improve Outcome: Progressing   Problem: Clinical Measurements: Goal: Ability to maintain clinical measurements within normal limits will improve Outcome: Progressing Goal: Will remain free from infection Outcome: Progressing Goal: Diagnostic test results will improve Outcome: Progressing Goal: Respiratory complications will improve Outcome: Progressing Goal: Cardiovascular complication will be avoided Outcome: Progressing   Problem: Activity: Goal: Risk for activity intolerance will decrease Outcome: Progressing   Problem: Nutrition: Goal: Adequate nutrition will be maintained Outcome: Progressing   Problem: Coping: Goal: Level of anxiety will decrease Outcome: Progressing   Problem: Elimination: Goal: Will not experience complications related to bowel motility Outcome: Progressing Goal: Will not experience complications related to urinary retention Outcome: Progressing   Problem: Pain Managment: Goal: General experience of comfort will improve and/or be controlled Outcome: Progressing   Problem: Safety: Goal: Ability to remain free from injury will improve Outcome: Progressing   Problem: Skin Integrity: Goal: Risk for impaired skin integrity will decrease Outcome: Progressing   Problem: Education: Goal: Ability to describe self-care measures that may prevent or decrease complications (Diabetes Survival Skills Education) will improve Outcome: Progressing   Problem: Coping: Goal: Ability to adjust to condition or change in health will improve Outcome: Progressing   Problem: Health Behavior/Discharge Planning: Goal: Ability to identify and utilize  available resources and services will improve Outcome: Progressing Goal: Ability to manage health-related needs will improve Outcome: Progressing   Problem: Metabolic: Goal: Ability to maintain appropriate glucose levels will improve Outcome: Progressing   Problem: Skin Integrity: Goal: Risk for impaired skin integrity will decrease Outcome: Progressing   Problem: Tissue Perfusion: Goal: Adequacy of tissue perfusion will improve Outcome: Progressing   Problem: Activity: Goal: Ability to tolerate increased activity will improve Outcome: Progressing

## 2023-05-15 NOTE — Discharge Summary (Signed)
 Physician Discharge Summary  Sarah Norton [facility] ZOX:[id number] DOB: 1955-08-25 DOA: 04/24/2023  PCP: Sarah Morgan, MD  Admit date: 04/24/2023 Discharge date: 05/15/2023  Admitted From: home  Disposition:  SNF  Recommendations for Outpatient Follow-up:  Follow up with PCP in 1-2 weeks   Home Health: no  Equipment/Devices: 3L [place]  Discharge Condition: stable  CODE STATUS:full  Diet recommendation: Dysphagia III diet: Carb Modified   Brief/Interim Summary: HPI was taken from NP Sarah Norton: Sarah Norton is a 68 year old female with a past medical history significant for COPD on home supplemental oxygen and diabetes mellitus who presented to [facility] ED on 04/24/2023 due to unresponsiveness.  Upon EMS arrival she was noted to be hypoxic with O2 saturations in the 40s for which she was placed on CPAP with improvement in sats to the 80s.  Upon arrival to the ED she remained somnolent but slightly arousable, and given her hypoxia, she was transitioned to BiPAP.  Pt is currently intubated and sedated, and no other history is currently available.    From ICU record: 68 y.o female admitted with Acute Metabolic Encephalopathy and Acute Hypoxic and Hypercapnic Respiratory Failure in the setting of Acute COPD Exacerbation due to Influenza A infection and questionable superimposed bacterial pneumonia, failed trial of BiPAP requiring intubation and mechanical ventilation. Course complicated by AKI requiring initiation of CRRT.    2/13: Presented to ED with AMS, acute respiratory distress and hypoxia.  Initially trialed on BiPAP but with worsening ABG and mental status.  ED provider intubated.  PCCM asked to admit. 04/25/2023 - Patient with improving respiratory status however with worsening wbc and procalcitonin.  04/26/2023 - HD cath placed and started on CRRT. 2/17 remains on vent, on CRRT 02/18: CRRT discontinued  02/19: On minimal vent settings.  Plan for WUA/SBT as tolerated utilizing Precedex.  With SBT,  increased RR/WOB/accessory muscle use.  Adequate urine output and electrolytes acceptable, no plan for HD today. 02/20: Remains on minimal vent support, SBT as tolerated. On WUA with Precedex, she opens eyes but not following commands,  MRI Brain negative for acute intracranial abnormality. Faild SBT due to tachypnea (RR 40's) and increased WOB 02/21: No significant events noted overnight.  Afebrile, hemodynamically stable, not requiring vasopressors. Leukocytosis improving. On minimal vent support, plan for WUA and SBT as tolerated ~ failed SBT due to tachypnea (RR 40's) and increased WOB.  Not following commands on WUA, will obtain EEG. 05/03/2023: Failed SBT due to altered mental status.   05/04/23: Tolerated SBT and was extubated to HF [place] 05/05/23- patient passed SLP for ice chips only thus far. Remains on HHFNC weaned to 40/40, drowsy during interview.  05/06/23- patient with GCS7 on exam this morning with labored breathing on BIPAP and tachyarrythmia. Will proceed with intubation emergently.  Family contacted husband is POA 05/07/23: Will not perform SBT today as she was re-intubated yesterday, likely needs Trach.  Tracheal aspirate with gram+ rods & cocci, low grade fever, will start empiric Zosyn and stop Azithromycin. 05/09/23- patient appears to have aspirated overnight.  She has thick discolored debris coming from ETT. She has no air leak on ETT eval for extubation.  We reviewed her care plan and refined therapy during multi disciplinary rounds.  05/10/23- patient weaned to 4L/min [place], she's more alert and is being optimized for TRH. Diet started.  05/13/23- stable for discharge to skilled nursing  Discharge Diagnoses:  Principal Problem:   Acute respiratory failure with hypoxia and hypercarbia (HCC) Active Problems:   Atypical parkinsonism (  HCC)   Diabetes mellitus type 2, uncomplicated (HCC)   Hypertension   COPD (chronic obstructive pulmonary disease) (HCC)   Polycythemia   Pressure injury of  skin   Atrial fibrillation (HCC)   Obesity (BMI 30-39.9)   Acute on chronic hypoxic hypercarbic respiratory failure: continue on supplemental oxygen and wean to baseline as tolerated which is 2.5L [place]. Failed bipap on arrival, intubated, failed extubation and re-intubated, now extubated and breathing comfortably on [place]  Influenza A: resolved     Aspiration pneumonia vs CAP: completed abx course. Bronchodilators prn    Toxic metabolic encephalopathy: likely secondary to CO2 narcosis. MRI brain shows no acute intracranial findings. Mental status improved   AKI: required CRRT in the ICU and since been d/c. Resolved    Debility: PT/OT recs SNF   DM2: continue on home anti-DM meds at discharge    COPD: w/o exacerbation. Bronchodilators prn.    HTN: continue on metoprolol, amlodipine, benazepril, lasix. D/c HCTZ  HLD: continue on statin    A-fib: likely PAF. Continue on metoprolol, eliquis    Hypothyroidism: continue on home dose of synthroid    Discharge Instructions  Discharge Instructions     Diet Carb Modified   Complete by: As directed    Dysphagia III diet   Discharge instructions   Complete by: As directed    F/u w/ PCP in 1-2 weeks.   Increase activity slowly   Complete by: As directed    No wound care   Complete by: As directed       Allergies as of 05/15/2023   No Known Allergies      Medication List     STOP taking these medications    hydrochlorothiazide 25 MG tablet Commonly known as: HYDRODIURIL   indomethacin 25 MG capsule Commonly known as: INDOCIN       TAKE these medications    albuterol 108 (90 Base) MCG/ACT inhaler Commonly known as: VENTOLIN HFA Inhale 2 puffs into the lungs every 4 (four) hours as needed for wheezing or shortness of breath.   allopurinol 100 MG tablet Commonly known as: ZYLOPRIM Take 100 mg by mouth every morning.   amLODipine 5 MG tablet Commonly known as: NORVASC Take 5 mg by mouth every morning.    atorvastatin 40 MG tablet Commonly known as: LIPITOR Take 40 mg by mouth every morning.   benazepril 40 MG tablet Commonly known as: LOTENSIN Take 40 mg by mouth every morning.   dexamethasone 2 MG tablet Commonly known as: DECADRON Take 1 tablet (2 mg total) by mouth daily for 3 days. Start taking on: May 16, 2023   Eliquis 5 MG Tabs tablet Generic drug: apixaban Take 5 mg by mouth 2 (two) times daily.   fluticasone 50 MCG/ACT nasal spray Commonly known as: FLONASE Place 2 sprays into both nostrils daily as needed for allergies.   fluticasone-salmeterol 115-21 MCG/ACT inhaler Commonly known as: ADVAIR HFA Inhale 2 puffs by mouth twice daily   furosemide 80 MG tablet Commonly known as: LASIX Take 80 mg by mouth every morning.   gabapentin 600 MG tablet Commonly known as: NEURONTIN Take 600 mg by mouth 3 (three) times daily.   levocetirizine 5 MG tablet Commonly known as: XYZAL Take 5 mg by mouth every evening.   levothyroxine 175 MCG tablet Commonly known as: SYNTHROID Take 175 mcg by mouth daily before breakfast.   methocarbamol 500 MG tablet Commonly known as: ROBAXIN Take 1-2 tablets by mouth every 6 (six) hours as  needed for muscle spasms.   Metoprolol Tartrate 75 MG Tabs Take 1 tablet by mouth 2 (two) times daily.   montelukast 10 MG tablet Commonly known as: SINGULAIR Take 10 mg by mouth every morning.   Mounjaro 10 MG/0.5ML Pen Generic drug: tirzepatide Inject 10 mg into the skin once a week.   Nyamyc powder Generic drug: nystatin Apply 1 Application topically 2 (two) times daily.   OXYGEN Inhale 3-4 L into the lungs daily.   promethazine 25 MG tablet Commonly known as: PHENERGAN Take 25 mg by mouth every 6 (six) hours as needed for nausea or vomiting.   Spiriva Respimat 1.25 MCG/ACT Aers Generic drug: Tiotropium Bromide Monohydrate Inhale 2 puffs into the lungs daily for 30 doses.   tiZANidine 4 MG tablet Commonly known as:  ZANAFLEX Take 4 mg by mouth 3 (three) times daily.   tretinoin 0.05 % cream Commonly known as: Retin-A Apply to face qhs, wash off qam        Follow-up Information     Aycock, Ngwe A, MD Follow up.   Specialty: Family Medicine Why: Hospital follow up Contact information: [address] [place] [address] [phone]                No Known Allergies  Consultations: ICU Palliative care Nephro    Procedures/Studies: MR BRAIN WO CONTRAST Result Date: 05/10/2023 CLINICAL DATA:  Initial evaluation for mental status change, unknown cause. EXAM: MRI HEAD WITHOUT CONTRAST TECHNIQUE: Multiplanar, multiecho pulse sequences of the brain and surrounding structures were obtained without intravenous contrast. COMPARISON:  MRI from 05/01/2023. FINDINGS: Brain: Examination mildly degraded by motion. Cerebral volume within normal limits. Patchy T2/FLAIR hyperintensity involving the periventricular deep white matter both cerebral hemispheres, consistent with chronic small vessel ischemic disease, mild in nature. No evidence for acute or subacute infarct. Gray-white matter differentiation maintained. No areas of chronic cortical infarction. No acute or chronic intracranial blood products. No mass lesion, midline shift or mass effect. No hydrocephalus or extra-axial fluid collection. Pituitary gland within normal limits. Vascular: Major intracranial vascular flow voids are maintained. Skull and upper cervical spine: Craniocervical junction within normal limits. Decreased T1 signal intensity noted within the visualized bone marrow, nonspecific, but most commonly related to anemia, smoking or obesity. No scalp soft tissue abnormality. Sinuses/Orbits: Globes orbital soft tissues demonstrate no acute finding. Scattered mucosal thickening noted about the sphenoid ethmoidal and maxillary sinuses. Large bilateral mastoid effusions. Imaged nasopharynx unremarkable. Other: None. IMPRESSION: 1.  No acute intracranial abnormality. 2. Mild chronic microvascular ischemic disease for age. 3. Large bilateral mastoid effusions, of uncertain significance. Correlation with physical exam suggested. Imaged nasopharynx is unremarkable. Electronically Signed   By: Rise Mu M.D.   On: 05/10/2023 18:24   DG Abd 1 View Result Date: 05/09/2023 CLINICAL DATA:  NG tube placement EXAM: ABDOMEN - 1 VIEW COMPARISON:  05/06/2023 FINDINGS: Limited radiograph of the lower chest and upper abdomen was obtained for the purposes of enteric tube localization. Enteric tube is seen coursing below the diaphragm with distal tip and side port terminating within the expected location of the gastric body. IMPRESSION: Enteric tube terminates within the expected location of the gastric body. Electronically Signed   By: Duanne Guess D.O.   On: 05/09/2023 12:34   DG Chest Port 1 View Result Date: 05/09/2023 CLINICAL DATA:  [id number] Respiratory distress [id number] [phone] Encounter for imaging study to confirm nasogastric (NG) tube placement [id number] EXAM: PORTABLE CHEST 1 VIEW COMPARISON:  05/08/2023 FINDINGS: ET  tube terminates approximately 3.3 cm above the carina. Enteric tube courses below the diaphragm with distal tip beyond the inferior margin of the film. Stable heart size. Aortic atherosclerosis. Streaky left basilar opacities. No appreciable pleural fluid collection. No pneumothorax. IMPRESSION: 1. ET tube terminates approximately 3.3 cm above the carina. 2. Streaky left basilar opacities, likely atelectasis. Electronically Signed   By: Duanne Guess D.O.   On: 05/09/2023 12:29   DG Chest Port 1 View Result Date: 05/08/2023 CLINICAL DATA:  [id number] Acute respiratory failure with hypercapnia (HCC) [id number] EXAM: PORTABLE CHEST 1 VIEW COMPARISON:  May 06, 2023 FINDINGS: The cardiomediastinal silhouette is unchanged in contour.ETT tip terminates approximately 4 cm above the carina. Enteric tube side port projects over  the proximal stomach. No pleural effusion. No pneumothorax. Improved aeration of the bilateral bases comparison to prior with some persistent bibasilar patchy opacities. Surgical clips project over neck. IMPRESSION: 1.  Support apparatus as described above. 2. Improved aeration of the bilateral bases with some persistent bibasilar patchy opacities. Electronically Signed   By: Meda Klinefelter M.D.   On: 05/08/2023 07:48   DG Chest Port 1 View Result Date: 05/06/2023 CLINICAL DATA:  Intubation.  NG placement. EXAM: PORTABLE CHEST AND ABDOMEN 1 VIEW COMPARISON:  Chest radiograph dated 05/05/2023. FINDINGS: Interval removal of the right IJ catheter. Endotracheal tube approximately 3.5 cm above the carina. Enteric tube extends below the diaphragm with tip in the left upper abdomen likely in the body of the stomach. Bibasilar densities similar or slightly progressed on the left. No pneumothorax. Stable cardiomegaly. Atherosclerotic calcification of the aorta. No acute osseous pathology. IMPRESSION: 1. Endotracheal tube approximately 3.5 cm above the carina. 2. Enteric tube with tip in the body of the stomach. 3. Bibasilar densities similar or slightly progressed on the left. Electronically Signed   By: Elgie Collard M.D.   On: 05/06/2023 11:11   DG Abd 1 View Result Date: 05/06/2023 CLINICAL DATA:  Intubation.  NG placement. EXAM: PORTABLE CHEST AND ABDOMEN 1 VIEW COMPARISON:  Chest radiograph dated 05/05/2023. FINDINGS: Interval removal of the right IJ catheter. Endotracheal tube approximately 3.5 cm above the carina. Enteric tube extends below the diaphragm with tip in the left upper abdomen likely in the body of the stomach. Bibasilar densities similar or slightly progressed on the left. No pneumothorax. Stable cardiomegaly. Atherosclerotic calcification of the aorta. No acute osseous pathology. IMPRESSION: 1. Endotracheal tube approximately 3.5 cm above the carina. 2. Enteric tube with tip in the body of  the stomach. 3. Bibasilar densities similar or slightly progressed on the left. Electronically Signed   By: Elgie Collard M.D.   On: 05/06/2023 11:11   DG Chest Port 1 View Result Date: 05/05/2023 CLINICAL DATA:  Acute hypoxic respiratory failure EXAM: PORTABLE CHEST 1 VIEW COMPARISON:  05/03/2023 FINDINGS: Single frontal view of the chest demonstrates right internal jugular catheter tip overlying superior vena cava. The cardiac silhouette is stable. Increasing bibasilar consolidation with trace bilateral pleural effusions identified. No pneumothorax. No acute bony abnormalities. IMPRESSION: 1. Increasing bibasilar consolidation which may reflect atelectasis or airspace disease. 2. Trace bilateral effusions. Electronically Signed   By: Sharlet Salina M.D.   On: 05/05/2023 18:54   DG Chest Port 1 View Result Date: 05/03/2023 CLINICAL DATA:  Acute respiratory failure with hypoxia and hypercarbia. EXAM: PORTABLE CHEST 1 VIEW COMPARISON:  April 30, 2023. FINDINGS: Stable cardiomediastinal silhouette. Endotracheal and nasogastric tubes are unchanged. Bilateral internal jugular catheters are unchanged. Minimal bibasilar subsegmental atelectasis is noted. Bony  thorax is unremarkable. IMPRESSION: Stable support apparatus. Minimal bibasilar subsegmental atelectasis. Electronically Signed   By: Lupita Raider M.D.   On: 05/03/2023 08:57   EEG adult Result Date: 05/02/2023 Charlsie Quest, MD     05/02/2023  5:03 PM Patient Name: MARVEEN DONLON MRN: [id number] Epilepsy Attending: Charlsie Quest Referring Physician/Provider: Judithe Modest, NP Date: 05/02/2023 Duration: 29.40 mins Patient history: 68yo F with ams. EEG to evaluate for seizure Level of alertness: comatose/ lethargic AEDs during EEG study: Propofol Technical aspects: This EEG study was done with scalp electrodes positioned according to the 10-20 International system of electrode placement. Electrical activity was reviewed with band pass filter  of 1-70Hz , sensitivity of 7 uV/mm, display speed of 59mm/sec with a 60Hz  notched filter applied as appropriate. EEG data were recorded continuously and digitally stored.  Video monitoring was available and reviewed as appropriate. Description: EEG showed continuous generalized predominantly 5 to 7 Hz theta-alpha activity. Intermittent generalized 2-3hz  delta slowing was also noted admixed with 13-15hz  beta activity. Hyperventilation and photic stimulation were not performed.   ABNORMALITY - Continuous slow, generalized IMPRESSION: This study is suggestive of moderate diffuse encephalopathy. No seizures or epileptiform discharges were seen throughout the recording. Charlsie Quest   MR BRAIN WO CONTRAST Result Date: 05/01/2023 CLINICAL DATA:  Anoxic brain damage. EXAM: MRI HEAD WITHOUT CONTRAST TECHNIQUE: Multiplanar, multiecho pulse sequences of the brain and surrounding structures were obtained without intravenous contrast. COMPARISON:  Head CT 04/24/2023 and MRI 12/02/2013 FINDINGS: The study is motion degraded despite attempts at repeat imaging, including severe motion on the axial FLAIR sequence which is nondiagnostic. Brain: Within limitations of motion, no acute infarct, intracranial hemorrhage, mass, midline shift, cerebral edema, or extra-axial fluid collection is identified. There is mild cerebral atrophy. T2 hyperintensities in the cerebral white matter are nonspecific but compatible with mild chronic small vessel ischemic disease. Vascular: Major intracranial vascular flow voids are preserved. Skull and upper cervical spine: No destructive skull lesion. Sinuses/Orbits: Unremarkable orbits. Mucosal thickening in the paranasal sinuses with moderately severe bilateral ethmoid and sphenoid sinus opacification and moderately large bilateral mastoid effusions in the setting of intubation. Other: None. IMPRESSION: 1. No acute intracranial abnormality identified on this motion degraded examination. 2. Mild  chronic small vessel ischemic disease. Electronically Signed   By: Sebastian Ache M.D.   On: 05/01/2023 16:17   DG Chest Port 1 View Result Date: 04/30/2023 CLINICAL DATA:  Acute respiratory failure with hypoxia and hypercarbia EXAM: PORTABLE CHEST 1 VIEW COMPARISON:  04/26/2023 FINDINGS: 2 frontal views of the chest demonstrate endotracheal tube overlying tracheal air column, tip approximately 5.5 cm above carina. Enteric catheter passes below diaphragm, tip projecting over the gastric body. Bilateral internal jugular catheters are seen, tips projecting over the superior vena cava. Cardiac silhouette is enlarged but stable. There is persistent but improved pulmonary vascular congestion. Improved aeration of the lung bases, with minimal residual left basilar consolidation and/or effusion. No pneumothorax. IMPRESSION: 1. Support devices as above. 2. Improved aeration of the lungs, with improved volume status. Persistent mild pulmonary vascular congestion, with likely small left effusion and left basilar atelectasis. Electronically Signed   By: Sharlet Salina M.D.   On: 04/30/2023 11:02   DG Chest Port 1 View Result Date: 04/26/2023 CLINICAL DATA:  Check central line placed EXAM: PORTABLE CHEST 1 VIEW COMPARISON:  Film from earlier in the same day. FINDINGS: Endotracheal tube is noted in satisfactory position. Gastric catheter extends into the stomach although the proximal  side port lies in the distal esophagus. This should be advanced deeper into the stomach. Left jugular central line is again seen. New right jugular central line is noted with the catheter tip in the proximal superior vena cava. No pneumothorax is noted. Stable parenchymal opacities are noted right greater than left. IMPRESSION: No pneumothorax following central line placement. Tubes and lines as described. Stable right-sided airspace opacity. Electronically Signed   By: Alcide Clever M.D.   On: 04/26/2023 19:35   US RENAL Result Date:  04/26/2023 CLINICAL DATA:  [id number] Acute kidney failure (HCC) [id number]. EXAM: RENAL / URINARY TRACT ULTRASOUND COMPLETE COMPARISON:  None Available. FINDINGS: Right Kidney: Renal measurements: 5.2 x 6.1 x 12.0 cm = volume: 199 mL. There is diffuse increased cortical echogenicity, nonspecific but commonly seen with medical renal disease. There are several simple cysts with largest measuring up to 1.7 x 1.7 x 1.8 cm. No contour deforming mass, hydronephrosis or stone large enough to cause acoustic shadowing. Left Kidney: Renal measurements: 4.9 x 6.1 x 12.2 cm = volume: 189 mL. There is diffuse increased cortical echogenicity, nonspecific but most commonly seen with medical renal disease. There is a single partially exophytic cyst in the lower pole measuring up to 1.1 x 1.3 x 1.4 cm. No contour deforming mass, hydronephrosis or stone large enough to cause acoustic shadowing. Bladder: Decompressed around a Foley catheter. Other: Incidental note is made of trace amount of ascites in the perihepatic and left perirenal space. IMPRESSION: 1. Bilateral kidneys exhibit diffuse increased cortical echogenicity, nonspecific but most commonly seen with medical renal disease. 2. There are bilateral simple renal cysts, as described above. 3. There is trace amount of ascites in the perihepatic and left perirenal space. Electronically Signed   By: Jules Schick M.D.   On: 04/26/2023 13:19   DG Chest Port 1 View Result Date: 04/26/2023 CLINICAL DATA:  [id number] Acute hypoxic respiratory failure [facility]) [id number] EXAM: PORTABLE CHEST 1 VIEW COMPARISON:  April 24, 2023 FINDINGS: The cardiomediastinal silhouette is unchanged in contour.ETT tip terminates 2.7 cm above the carina. The enteric tube courses through the chest to the abdomen beyond the field-of-view. LEFT neck CVC tip terminates over the superior cavoatrial junction. Favored trace RIGHT pleural effusion. No pneumothorax. Improved aeration of the RIGHT lung base with persistent  bibasilar heterogeneous opacities. IMPRESSION: 1. Support apparatus as described above. 2. Improved aeration of the RIGHT lung base with persistent bibasilar heterogeneous opacities. Electronically Signed   By: Meda Klinefelter M.D.   On: 04/26/2023 12:04   ECHOCARDIOGRAM COMPLETE Result Date: 04/25/2023    ECHOCARDIOGRAM REPORT   Patient Name:   GRAZIELLA CONNERY Date of Exam: 04/25/2023 Medical Rec #:  [id number]      Height:       68.0 in Accession #:    [id number]     Weight:       241.6 lb Date of Birth:  [birth year]      BSA:          2.215 m Patient Age:    67 years       BP:           141/51 mmHg Patient Gender: F              HR:           85 bpm. Exam Location:  [facility] Procedure: 2D Echo, Cardiac Doppler and Color Doppler (Both Spectral and Color            Flow Doppler  were utilized during procedure). Indications:     Elevated Troponin  History:         Patient has no prior history of Echocardiogram examinations.                  COPD; Risk Factors:Hypertension and Diabetes. Influenza +.  Sonographer:     Mikki Harbor Referring Phys:  [id number] Judithe Modest Diagnosing Phys: Debbe Odea MD  Sonographer Comments: Echo performed with patient supine and on artificial respirator. Image acquisition challenging due to respiratory motion. IMPRESSIONS  1. Left ventricular ejection fraction, by estimation, is 60 to 65%. The left ventricle has normal function. The left ventricle has no regional wall motion abnormalities. There is mild left ventricular hypertrophy. Left ventricular diastolic parameters were normal.  2. Right ventricular systolic function is normal. The right ventricular size is normal. There is moderately elevated pulmonary artery systolic pressure.  3. The mitral valve is normal in structure. No evidence of mitral valve regurgitation.  4. The aortic valve is calcified. Aortic valve regurgitation is not visualized. Mild to moderate aortic valve stenosis. Aortic valve mean gradient measures  18.0 mmHg.  5. The inferior vena cava is dilated in size with <50% respiratory variability, suggesting right atrial pressure of 15 mmHg. FINDINGS  Left Ventricle: Left ventricular ejection fraction, by estimation, is 60 to 65%. The left ventricle has normal function. The left ventricle has no regional wall motion abnormalities. Strain imaging was not performed. The left ventricular internal cavity  size was normal in size. There is mild left ventricular hypertrophy. Left ventricular diastolic parameters were normal. Right Ventricle: The right ventricular size is normal. No increase in right ventricular wall thickness. Right ventricular systolic function is normal. There is moderately elevated pulmonary artery systolic pressure. The tricuspid regurgitant velocity is 3.00 m/s, and with an assumed right atrial pressure of 15 mmHg, the estimated right ventricular systolic pressure is 51.0 mmHg. Left Atrium: Left atrial size was normal in size. Right Atrium: Right atrial size was normal in size. Pericardium: There is no evidence of pericardial effusion. Mitral Valve: The mitral valve is normal in structure. Mild mitral annular calcification. No evidence of mitral valve regurgitation. MV peak gradient, 10.5 mmHg. The mean mitral valve gradient is 3.0 mmHg. Tricuspid Valve: The tricuspid valve is normal in structure. Tricuspid valve regurgitation is not demonstrated. Aortic Valve: The aortic valve is calcified. Aortic valve regurgitation is not visualized. Mild to moderate aortic stenosis is present. Aortic valve mean gradient measures 18.0 mmHg. Aortic valve peak gradient measures 36.6 mmHg. Aortic valve area, by VTI measures 2.10 cm. Pulmonic Valve: The pulmonic valve was normal in structure. Pulmonic valve regurgitation is not visualized. Aorta: The aortic root and ascending aorta are structurally normal, with no evidence of dilitation. Venous: The inferior vena cava is dilated in size with less than 50% respiratory  variability, suggesting right atrial pressure of 15 mmHg. IAS/Shunts: No atrial level shunt detected by color flow Doppler. Additional Comments: 3D imaging was not performed.  LEFT VENTRICLE PLAX 2D LVIDd:         5.30 cm   Diastology LVIDs:         2.50 cm   LV e' medial:    8.49 cm/s LV PW:         1.20 cm   LV E/e' medial:  17.2 LV IVS:        1.10 cm   LV e' lateral:   16.40 cm/s LVOT diam:     2.00  cm   LV E/e' lateral: 8.9 LV SV:         111 LV SV Index:   50 LVOT Area:     3.14 cm  RIGHT VENTRICLE RV Basal diam:  3.75 cm RV Mid diam:    3.90 cm RV S prime:     17.30 cm/s TAPSE (M-mode): 2.5 cm LEFT ATRIUM             Index        RIGHT ATRIUM           Index LA diam:        4.00 cm 1.81 cm/m   RA Area:     15.90 cm LA Vol (A2C):   72.3 ml 32.64 ml/m  RA Volume:   41.80 ml  18.87 ml/m LA Vol (A4C):   57.9 ml 26.14 ml/m LA Biplane Vol: 66.2 ml 29.89 ml/m  AORTIC VALVE                     PULMONIC VALVE AV Area (Vmax):    1.92 cm      PV Vmax:       1.68 m/s AV Area (Vmean):   1.97 cm      PV Peak grad:  11.3 mmHg AV Area (VTI):     2.10 cm AV Vmax:           302.67 cm/s AV Vmean:          199.333 cm/s AV VTI:            0.527 m AV Peak Grad:      36.6 mmHg AV Mean Grad:      18.0 mmHg LVOT Vmax:         185.00 cm/s LVOT Vmean:        125.000 cm/s LVOT VTI:          0.352 m LVOT/AV VTI ratio: 0.67  AORTA Ao Root diam: 2.90 cm MITRAL VALVE                TRICUSPID VALVE MV Area (PHT): 2.71 cm     TR Peak grad:   36.0 mmHg MV Area VTI:   2.02 cm     TR Vmax:        300.00 cm/s MV Peak grad:  10.5 mmHg MV Mean grad:  3.0 mmHg     SHUNTS MV Vmax:       1.62 m/s     Systemic VTI:  0.35 m MV Vmean:      82.0 cm/s    Systemic Diam: 2.00 cm MV Decel Time: 280 msec MV E velocity: 146.00 cm/s MV A velocity: 102.00 cm/s MV E/A ratio:  1.43 Debbe Odea MD Electronically signed by Debbe Odea MD Signature Date/Time: 04/25/2023/1:16:24 PM    Final    DG Chest Port 1 View Result Date:  04/24/2023 CLINICAL DATA:  Central line placement EXAM: PORTABLE CHEST 1 VIEW COMPARISON:  04/24/2023 FINDINGS: Two frontal views of the chest demonstrate stable endotracheal tube and enteric catheter. Left internal jugular catheter tip overlies superior vena cava. Cardiac silhouette remains enlarged. Persistent bibasilar airspace disease and small left pleural effusion. No pneumothorax. IMPRESSION: 1. No complication after left internal jugular catheter placement. 2. Persistent bibasilar airspace disease and left effusion. Electronically Signed   By: Sharlet Salina M.D.   On: 04/24/2023 23:44   CT Angio Chest PE W and/or Wo Contrast Result Date: 04/24/2023 CLINICAL DATA:  Respiratory failure. EXAM:  CT ANGIOGRAPHY CHEST WITH CONTRAST TECHNIQUE: Multidetector CT imaging of the chest was performed using the standard protocol during bolus administration of intravenous contrast. Multiplanar CT image reconstructions and MIPs were obtained to evaluate the vascular anatomy. RADIATION DOSE REDUCTION: This exam was performed according to the departmental dose-optimization program which includes automated exposure control, adjustment of the mA and/or kV according to patient size and/or use of iterative reconstruction technique. CONTRAST:  75mL OMNIPAQUE IOHEXOL 350 MG/ML SOLN COMPARISON:  10/16/2004. FINDINGS: Cardiovascular: Mild cardiomegaly. No pericardial effusion. No pulmonary artery filling defects to suggest PE. No evidence of aortic aneurysm or dissection. There are atheromatous calcifications of the aorta and coronary arteries. Mediastinum/Nodes: Paratracheal adenopathy identified with a node measuring up to 1.2 cm short axis. Postop changes in the thyroid bed. Unremarkable tracheobronchial tree. Endotracheal tube just above carina. NG tube in the esophagus extending to the stomach. Lungs/Pleura: Atelectasis right lower lobe with volume loss and shift mediastinal structures towards the right. Interstitial  prominence with interstitial edema and alveolar ground-glass opacities consistent with edema or pneumonitis. Bibasilar consolidation more prominent on the right than the left. No pneumothorax. Upper Abdomen: No acute abnormality. Musculoskeletal: No chest wall abnormality. No acute or significant osseous findings. Review of the MIP images confirms the above findings. IMPRESSION: 1. No evidence of PE, aortic aneurysm or dissection. 2. Cardiomegaly. 3. Interstitial and alveolar edema or pneumonitis. 4. Bibasilar consolidation more prominent on the right than the left. Atelectasis right base with volume loss. 5. Endotracheal tube just above carina. 6. Aortic atherosclerosis (ICD10-I70.0). Electronically Signed   By: Layla Maw M.D.   On: 04/24/2023 19:05   CT HEAD WO CONTRAST ( ) Result Date: 04/24/2023 CLINICAL DATA:  altered mental status EXAM: CT HEAD WITHOUT CONTRAST TECHNIQUE: Contiguous axial were obtained from the base of the skull through the vertex without intravenous contrast. RADIATION DOSE REDUCTION: This exam was performed according to the departmental dose-optimization program which includes automated exposure control, adjustment of the mA and/or kV according to patient size and/or use of iterative reconstruction technique. COMPARISON:  11/16/2013. FINDINGS: Brain: No evidence of acute infarction, hemorrhage, hydrocephalus, extra-axial collection or mass lesion/mass effect. Vascular: No hyperdense vessel or unexpected calcification. Skull: Normal. Negative for fracture or focal lesion. Sinuses/Orbits: Mucoperiosteal thickening consistent with chronic pansinusitis. Other: None. IMPRESSION: Chronic pansinusitis.  No acute intracranial process. Electronically Signed   By: Layla Maw M.D.   On: 04/24/2023 18:57   DG Abdomen 1 View Result Date: 04/24/2023 CLINICAL DATA:  OG tube placement EXAM: ABDOMEN - 1 VIEW COMPARISON:  None Available. FINDINGS: OG tube is in the stomach. Prior  cholecystectomy. Bilateral lower lobe airspace opacities noted. IMPRESSION: OG tube in the stomach. Electronically Signed   By: Charlett Nose M.D.   On: 04/24/2023 17:13   DG Chest 1 View Result Date: 04/24/2023 CLINICAL DATA:  ET tube EXAM: CHEST  1 VIEW COMPARISON:  04/24/2023 FINDINGS: Endotracheal tube is 3 cm above the carina. NG tube enters the stomach. Cardiomegaly, vascular congestion. Bilateral lower lung airspace opacities are again noted, unchanged. No pneumothorax. No acute bony abnormality. IMPRESSION: Endotracheal tube 3 cm above the carina. Bilateral lower lung airspace opacities concerning for multifocal pneumonia. Findings unchanged. Electronically Signed   By: Charlett Nose M.D.   On: 04/24/2023 17:12   DG Chest Portable 1 View Result Date: 04/24/2023 CLINICAL DATA:  Respiratory distress.  History of asthma. EXAM: PORTABLE CHEST 1 VIEW COMPARISON:  04/02/2016. FINDINGS: There are bibasilar heterogeneous opacities, obscuring the right heart border, left  heart border and left hemidiaphragm. Findings favor multilobar pneumonia. Follow-up to clearing is recommended. There is apparent blunting of bilateral lateral costophrenic angles, which may be due to trace pleural effusions versus overlying lung parenchymal opacities. This can be better evaluated on lateral radiograph or ultrasound. Mildly enlarged cardio-mediastinal silhouette, which may be accentuated by AP technique. No acute osseous abnormalities. The soft tissues are within normal limits. There are multiple surgical staples overlying the upper thoracic vertebrae. Correlate with surgical history. IMPRESSION: *Bibasilar heterogeneous opacities, favored to represent multilobar pneumonia. Follow-up to clearing is recommended. Electronically Signed   By: Jules Schick M.D.   On: 04/24/2023 14:50   (Echo, Carotid, EGD, Colonoscopy, ERCP)    Subjective: Pt c/o fatigue    Discharge Exam: Vitals:   05/15/23 0355 05/15/23 0722  BP: (!)  141/84 136/85  Pulse: 66 67  Resp: 20 16  Temp: 98.3 F (36.8 C) 97.9 F (36.6 C)  SpO2: 97% 100%   Vitals:   05/14/23 1608 05/15/23 0355 05/15/23 0356 05/15/23 0722  BP: (!) 140/85 (!) 141/84  136/85  Pulse: 68 66  67  Resp: 16 20  16   Temp: (!) 97.4 F (36.3 C) 98.3 F (36.8 C)  97.9 F (36.6 C)  TempSrc:      SpO2: 98% 97%  100%  Weight:   106.7 kg   Height:        General: Pt is alert, awake, not in acute distress Cardiovascular: S1/S2 +, no rubs, no gallops Respiratory: decreased breath sounds b/l otherwise clear  Abdominal: Soft, NT,obese, bowel sounds + Extremities:no cyanosis    The results of significant diagnostics from this hospitalization (including imaging, microbiology, ancillary and laboratory) are listed below for reference.     Microbiology: Recent Results (from the past 240 hours)  Culture, Respiratory w Gram Stain     Status: None   Collection Time: 05/06/23 11:32 AM   Specimen: Tracheal Aspirate; Respiratory  Result Value Ref Range Status   Specimen Description   Final    TRACHEAL ASPIRATE Performed at [facility], [address], [place], [place] [address]    Special Requests   Final    NONE Performed at [facility], [address] [place], [place] [address]    Gram Stain   Final    RARE WBC PRESENT, PREDOMINANTLY PMN FEW GRAM POSITIVE RODS RARE GRAM POSITIVE COCCI    Culture   Final    MODERATE CORYNEBACTERIUM MINUTISSIMUM Standardized susceptibility testing for this organism is not available. Performed at [facility], [address] [place], [place] [address]    Report Status 05/09/2023 FINAL  Final  MRSA Next Gen by PCR, Nasal     Status: None   Collection Time: 05/07/23 11:31 AM   Specimen: Nasal Mucosa; Nasal Swab  Result Value Ref Range Status   MRSA by PCR Next Gen NOT DETECTED NOT DETECTED Final    Comment: (NOTE) The GeneXpert MRSA Assay (FDA approved for NASAL specimens only), is one  component of a comprehensive MRSA colonization surveillance program. It is not intended to diagnose MRSA infection nor to guide or monitor treatment for MRSA infections. Test performance is not FDA approved in patients less than 103 years old. Performed at [facility], [address] [place], [place] [address]      Labs: BNP (last 3 results) Recent Labs    04/24/23 1240  BNP 436.2*   Basic Metabolic Panel: Recent Labs  Lab 05/09/23 0350 05/10/23 0346  05/11/23 0312 05/12/23 0501 05/13/23 0509 05/15/23 0544  NA 140 143 139 136 136 134*  K 3.9 2.9* 3.4* 3.6 3.7 3.9  CL 101 97* 100 100 103 103  CO2 28 33* 29 27 26 23   GLUCOSE 194* 83 85 106* 113* 96  BUN 82* 59* 46* 41* 38* 35*  CREATININE 1.37* 1.06* 1.05* 1.12* 0.98 0.85  CALCIUM 9.5 9.8 9.5 9.4 9.6 9.4  MG 2.1 2.1 2.0  --   --   --   PHOS 3.2 2.5 2.8 3.2  --   --    Liver Function Tests: Recent Labs  Lab 05/09/23 0350 05/10/23 0346 05/11/23 0312 05/12/23 0501  ALBUMIN 2.7* 3.0* 2.8* 2.9*   No results for input(s): "LIPASE", "AMYLASE" in the last 168 hours. No results for input(s): "AMMONIA" in the last 168 hours. CBC: Recent Labs  Lab 05/09/23 0350 05/10/23 0346 05/11/23 0312 05/12/23 0501  WBC 11.9* 12.1* 11.2* 9.7  HGB 10.1* 11.1* 11.4* 10.7*  HCT 30.1* 33.0* 34.3* 32.1*  MCV 91.2 91.4 93.2 92.8  PLT 438* 472* 452* 377   Cardiac Enzymes: No results for input(s): "CKTOTAL", "CKMB", "CKMBINDEX", "TROPONINI" in the last 168 hours. BNP: Invalid input(s): "POCBNP" CBG: Recent Labs  Lab 05/14/23 1150 05/14/23 1606 05/14/23 2028 05/15/23 0724 05/15/23 1122  GLUCAP 161* 187* 98 92 182*   D-Dimer No results for input(s): "DDIMER" in the last 72 hours. Hgb A1c No results for input(s): "HGBA1C" in the last 72 hours. Lipid Profile No results for input(s): "CHOL", "HDL", "LDLCALC", "TRIG", "CHOLHDL", "LDLDIRECT" in the last 72 hours. Thyroid function studies No results for input(s):  "TSH", "T4TOTAL", "T3FREE", "THYROIDAB" in the last 72 hours.  Invalid input(s): "FREET3" Anemia work up No results for input(s): "VITAMINB12", "FOLATE", "FERRITIN", "TIBC", "IRON", "RETICCTPCT" in the last 72 hours. Urinalysis    Component Value Date/Time   COLORURINE YELLOW (A) 05/10/2023 1330   APPEARANCEUR CLEAR (A) 05/10/2023 1330   APPEARANCEUR Clear 11/16/2013 1900   LABSPEC 1.021 05/10/2023 1330   LABSPEC 1.014 11/16/2013 1900   PHURINE 7.0 05/10/2023 1330   GLUCOSEU NEGATIVE 05/10/2023 1330   GLUCOSEU 150 mg/dL 69/62/9528 4132   HGBUR NEGATIVE 05/10/2023 1330   BILIRUBINUR NEGATIVE 05/10/2023 1330   BILIRUBINUR Negative 11/16/2013 1900   KETONESUR NEGATIVE 05/10/2023 1330   PROTEINUR 100 (A) 05/10/2023 1330   NITRITE NEGATIVE 05/10/2023 1330   LEUKOCYTESUR NEGATIVE 05/10/2023 1330   LEUKOCYTESUR Negative 11/16/2013 1900   Sepsis Labs Recent Labs  Lab 05/09/23 0350 05/10/23 0346 05/11/23 0312 05/12/23 0501  WBC 11.9* 12.1* 11.2* 9.7   Microbiology Recent Results (from the past 240 hours)  Culture, Respiratory w Gram Stain     Status: None   Collection Time: 05/06/23 11:32 AM   Specimen: Tracheal Aspirate; Respiratory  Result Value Ref Range Status   Specimen Description   Final    TRACHEAL ASPIRATE Performed at [facility], [address], [place], [place] [address]    Special Requests   Final    NONE Performed at [facility], [address] [place], [place] [address]    Gram Stain   Final    RARE WBC PRESENT, PREDOMINANTLY PMN FEW GRAM POSITIVE RODS RARE GRAM POSITIVE COCCI    Culture   Final    MODERATE CORYNEBACTERIUM MINUTISSIMUM Standardized susceptibility testing for this organism is not available. Performed at [facility], [address] [place], [place] [address]    Report Status 05/09/2023 FINAL  Final  MRSA Next  Gen by PCR, Nasal     Status: None   Collection Time: 05/07/23 11:31 AM   Specimen: Nasal Mucosa;  Nasal Swab  Result Value Ref Range Status   MRSA by PCR Next Gen NOT DETECTED NOT DETECTED Final    Comment: (NOTE) The GeneXpert MRSA Assay (FDA approved for NASAL specimens only), is one component of a comprehensive MRSA colonization surveillance program. It is not intended to diagnose MRSA infection nor to guide or monitor treatment for MRSA infections. Test performance is not FDA approved in patients less than 20 years old. Performed at [facility], [address] [place], [place] [address]      Time coordinating discharge: Over 30 minutes  SIGNED:   Charise Killian, MD  [facility] 05/15/2023, 11:47 AM Pager   If 7PM-7AM, please contact night-coverage [email or web address]

## 2023-05-15 NOTE — Progress Notes (Signed)
 Occupational Therapy Treatment Patient Details Name: Sarah Norton MRN: [id number] DOB: March 18, 1955 Today's Date: 05/15/2023   History of present illness 68 y.o female admitted with Acute Metabolic Encephalopathy and Acute Hypoxic and Hypercapnic Respiratory Failure in the setting of Acute COPD Exacerbation due to Influenza A infection and questionable superimposed bacterial pneumonia, failed trial of BiPAP requiring intubation and mechanical ventilation.  Course complicated by AKI requiring initiation of CRRT, afib with RVR.   OT comments  Pt is supine in bed on arrival. Pleasant and agreeable to OT session. She reports pain in her back and buttocks. Pt performed bed mobility with Max A, cueing for initiation d/t slow processing. Seated balance initially CGA progressing to SUP. Pt able to sit at EOB x15 mins. Attempted 3 STS trials from EOB to RW with pt only able to perform x1 partial stand with bed significantly elevated. Unsuccessful on other 2 trials. Max A x2 with bed in trendelenburg to reposition to [facility].  Pt returned to bed with all needs in place and will cont to require skilled acute OT services to maximize her safety and IND to return to PLOF.      If plan is discharge home, recommend the following:  Two people to help with walking and/or transfers;A lot of help with bathing/dressing/bathroom;Direct supervision/assist for medications management;Supervision due to cognitive status;Assist for transportation;Assistance with cooking/housework;Assistance with feeding;Help with stairs or ramp for entrance;Direct supervision/assist for financial management   Equipment Recommendations  Other (comment) (defer)    Recommendations for Other Services      Precautions / Restrictions Precautions Precautions: Fall Recall of Precautions/Restrictions: Impaired Restrictions Weight Bearing Restrictions Per Provider Order: No       Mobility Bed Mobility Overal bed mobility: Needs Assistance Bed  Mobility: Supine to Sit, Sit to Supine     Supine to sit: Max assist, HOB elevated, Used rails Sit to supine: Max assist   General bed mobility comments: assist for trunk and B LE's; vc's for initiation/sequencing/bed rail use    Transfers Overall transfer level: Needs assistance Equipment used: Rolling walker (2 wheels) Transfers: Sit to/from Stand Sit to Stand: Total assist           General transfer comment: attmpted STS from EOB x3 with pt only sucessfully able to reach a partial stand on 1 attempt     Balance Overall balance assessment: Needs assistance Sitting-balance support: Bilateral upper extremity supported, Feet supported Sitting balance-Leahy Scale: Fair Sitting balance - Comments: initial posterior lean in sitting corrected with cueing Postural control: Posterior lean   Standing balance-Leahy Scale: Zero                             ADL either performed or assessed with clinical judgement   ADL                                              Extremity/Trunk Assessment              Vision       Perception     Praxis     Communication Communication Communication: Impaired Factors Affecting Communication: Difficulty expressing self   Cognition Arousal: Alert Behavior During Therapy: Flat affect  Following commands: Impaired Following commands impaired: Follows one step commands inconsistently, Follows one step commands with increased time      Cueing   Cueing Techniques: Verbal cues, Gestural cues, Tactile cues, Visual cues  Exercises      Shoulder Instructions       General Comments      Pertinent Vitals/ Pain       Pain Assessment Pain Assessment: Faces Faces Pain Scale: Hurts little more Pain Location: back/buttocks Pain Descriptors / Indicators: Discomfort Pain Intervention(s): Monitored during session, Repositioned, Limited activity within patient's  tolerance  Home Living                                          Prior Functioning/Environment              Frequency  Min 1X/week        Progress Toward Goals  OT Goals(current goals can now be found in the care plan section)     Acute Rehab OT Goals OT Goal Formulation: With patient Time For Goal Achievement: 05/24/23 Potential to Achieve Goals: Good  Plan      Co-evaluation                 AM-PAC OT "6 Clicks" Daily Activity     Outcome Measure   Help from another person eating meals?: A Little Help from another person taking care of personal grooming?: A Little Help from another person toileting, which includes using toliet, bedpan, or urinal?: A Lot Help from another person bathing (including washing, rinsing, drying)?: A Lot Help from another person to put on and taking off regular upper body clothing?: A Lot Help from another person to put on and taking off regular lower body clothing?: A Lot 6 Click Score: 14    End of Session Equipment Utilized During Treatment: Gait belt;Oxygen;Rolling walker (2 wheels)  OT Visit Diagnosis: Other abnormalities of gait and mobility (R26.89);Hemiplegia and hemiparesis Hemiplegia - Right/Left: Left Hemiplegia - dominant/non-dominant: Non-Dominant Hemiplegia - caused by: Unspecified   Activity Tolerance Patient tolerated treatment well   Patient Left in bed;with call bell/phone within reach;with bed alarm set   Nurse Communication Mobility status        Time: 1610-9604 OT Time Calculation (min): 32 min  Charges: OT General Charges $OT Visit: 1 Visit OT Treatments $Therapeutic Activity: 23-37 mins  Donnavin Vandenbrink, OTR/L  05/15/23, 12:37 PM   Lizmary Nader E Karleigh Bunte 05/15/2023, 12:31 PM

## 2023-05-22 ENCOUNTER — Other Ambulatory Visit: Payer: Self-pay | Admitting: Dermatology

## 2023-05-22 DIAGNOSIS — L988 Other specified disorders of the skin and subcutaneous tissue: Secondary | ICD-10-CM

## 2023-06-16 ENCOUNTER — Telehealth: Payer: Self-pay

## 2023-06-16 DIAGNOSIS — L988 Other specified disorders of the skin and subcutaneous tissue: Secondary | ICD-10-CM

## 2023-06-16 MED ORDER — TRETINOIN 0.05 % EX CREA: TOPICAL_CREAM | CUTANEOUS | 0 refills | Status: AC

## 2023-06-16 NOTE — Telephone Encounter (Signed)
 Patient called she has been hospitalized and was not able to fill her Tretinoin rx before he refills expired she would like to know if we could refill 1 refill enough to last until she can get into the office.   Ok tretinoin .05% cream 45 gm 0RF sent to [facility] [address], return to the office before any additional refills

## 2023-07-01 ENCOUNTER — Other Ambulatory Visit: Payer: Self-pay

## 2023-07-01 ENCOUNTER — Emergency Department
Admission: EM | Admit: 2023-07-01 | Discharge: 2023-07-01 | Disposition: A | Discharge status: Home / Self Care | Attending: Emergency Medicine | Admitting: Emergency Medicine

## 2023-07-01 ENCOUNTER — Encounter: Payer: Self-pay | Admitting: Emergency Medicine

## 2023-07-01 ENCOUNTER — Emergency Department

## 2023-07-01 DIAGNOSIS — I2510 Atherosclerotic heart disease of native coronary artery without angina pectoris: Secondary | ICD-10-CM | POA: Diagnosis not present

## 2023-07-01 DIAGNOSIS — J45909 Unspecified asthma, uncomplicated: Secondary | ICD-10-CM | POA: Diagnosis not present

## 2023-07-01 DIAGNOSIS — J449 Chronic obstructive pulmonary disease, unspecified: Secondary | ICD-10-CM | POA: Diagnosis not present

## 2023-07-01 DIAGNOSIS — R079 Chest pain, unspecified: Secondary | ICD-10-CM | POA: Insufficient documentation

## 2023-07-01 DIAGNOSIS — E039 Hypothyroidism, unspecified: Secondary | ICD-10-CM | POA: Diagnosis not present

## 2023-07-01 DIAGNOSIS — Z79899 Other long term (current) drug therapy: Secondary | ICD-10-CM | POA: Insufficient documentation

## 2023-07-01 DIAGNOSIS — E119 Type 2 diabetes mellitus without complications: Secondary | ICD-10-CM | POA: Diagnosis not present

## 2023-07-01 DIAGNOSIS — I10 Essential (primary) hypertension: Secondary | ICD-10-CM | POA: Diagnosis not present

## 2023-07-01 DIAGNOSIS — N179 Acute kidney failure, unspecified: Secondary | ICD-10-CM | POA: Insufficient documentation

## 2023-07-01 DIAGNOSIS — Z7901 Long term (current) use of anticoagulants: Secondary | ICD-10-CM | POA: Insufficient documentation

## 2023-07-01 DIAGNOSIS — J90 Pleural effusion, not elsewhere classified: Secondary | ICD-10-CM

## 2023-07-01 LAB — COMPREHENSIVE METABOLIC PANEL WITH GFR
ALT: 17 U/L (ref 0–44)
AST: 16 U/L (ref 15–41)
Albumin: 3.7 g/dL (ref 3.5–5.0)
Alkaline Phosphatase: 75 U/L (ref 38–126)
Anion gap: 10 (ref 5–15)
BUN: 40 mg/dL — ABNORMAL HIGH (ref 8–23)
CO2: 25 mmol/L (ref 22–32)
Calcium: 10.4 mg/dL — ABNORMAL HIGH (ref 8.9–10.3)
Chloride: 102 mmol/L (ref 98–111)
Creatinine, Ser: 1.7 mg/dL — ABNORMAL HIGH (ref 0.44–1.00)
GFR, Estimated: 32 mL/min — ABNORMAL LOW (ref >60)
Glucose, Bld: 152 mg/dL — ABNORMAL HIGH (ref 70–99)
Potassium: 3.8 mmol/L (ref 3.5–5.1)
Sodium: 137 mmol/L (ref 135–145)
Total Bilirubin: 0.7 mg/dL (ref 0.0–1.2)
Total Protein: 6.7 g/dL (ref 6.5–8.1)

## 2023-07-01 LAB — CBC WITH DIFFERENTIAL/PLATELET
Abs Immature Granulocytes: 0.03 10*3/uL (ref 0.00–0.07)
Basophils Absolute: 0.1 10*3/uL (ref 0.0–0.1)
Basophils Relative: 1 %
Eosinophils Absolute: 0 10*3/uL (ref 0.0–0.5)
Eosinophils Relative: 0 %
HCT: 33.1 % — ABNORMAL LOW (ref 36.0–46.0)
Hemoglobin: 11.1 g/dL — ABNORMAL LOW (ref 12.0–15.0)
Immature Granulocytes: 0 %
Lymphocytes Relative: 25 %
Lymphs Abs: 2.2 10*3/uL (ref 0.7–4.0)
MCH: 31.4 pg (ref 26.0–34.0)
MCHC: 33.5 g/dL (ref 30.0–36.0)
MCV: 93.5 fL (ref 80.0–100.0)
Monocytes Absolute: 0.5 10*3/uL (ref 0.1–1.0)
Monocytes Relative: 5 %
Neutro Abs: 6.2 10*3/uL (ref 1.7–7.7)
Neutrophils Relative %: 69 %
Platelets: 272 10*3/uL (ref 150–400)
RBC: 3.54 MIL/uL — ABNORMAL LOW (ref 3.87–5.11)
RDW: 13.2 % (ref 11.5–15.5)
WBC: 9 10*3/uL (ref 4.0–10.5)
nRBC: 0 % (ref 0.0–0.2)

## 2023-07-01 LAB — TROPONIN I (HIGH SENSITIVITY)
Troponin I (High Sensitivity): 5 ng/L (ref <18)
Troponin I (High Sensitivity): 6 ng/L (ref <18)

## 2023-07-01 LAB — LIPASE, BLOOD: Lipase: 69 U/L — ABNORMAL HIGH (ref 11–51)

## 2023-07-01 MED ORDER — MORPHINE SULFATE (PF) 4 MG/ML IV SOLN
4.0000 mg | Freq: Once | INTRAVENOUS | Status: AC
  Administered 2023-07-01: 4 mg via INTRAVENOUS
  Filled 2023-07-01: qty 1

## 2023-07-01 MED ORDER — ONDANSETRON HCL 4 MG/2ML IJ SOLN
4.0000 mg | Freq: Once | INTRAMUSCULAR | Status: AC
  Administered 2023-07-01: 4 mg via INTRAVENOUS
  Filled 2023-07-01: qty 2

## 2023-07-01 MED ORDER — OXYCODONE-ACETAMINOPHEN 5-325 MG PO TABS
1.0000 | ORAL_TABLET | Freq: Once | ORAL | Status: AC
  Administered 2023-07-01: 1 via ORAL
  Filled 2023-07-01: qty 1

## 2023-07-01 MED ORDER — OXYCODONE-ACETAMINOPHEN 5-325 MG PO TABS: 1.0000 | ORAL_TABLET | ORAL | 0 refills | Status: AC | PRN

## 2023-07-01 MED ORDER — SODIUM CHLORIDE 0.9 % IV BOLUS
1000.0000 mL | Freq: Once | INTRAVENOUS | Status: AC
  Administered 2023-07-01: 1000 mL via INTRAVENOUS

## 2023-07-01 NOTE — ED Notes (Signed)
 Pt found to be resting on her left side with her eyes closed, resp even and unlabored, pt awakened with gentle shaking

## 2023-07-01 NOTE — ED Provider Notes (Signed)
 Emergency department handoff note  Care of this patient was signed out to me at the end of the previous provider shift.  All pertinent patient information was conveyed and all questions were answered.  Patient pending chest x-ray read that showed a small left pleural effusion which may be causing irritation of the pleura causing her chest pain.  The patient has been reexamined and is ready to be discharged.  All diagnostic results have been reviewed and discussed with the patient/family.  Care plan has been outlined and the patient/family understands all current diagnoses, results, and treatment plans.  There are no new complaints, changes, or physical findings at this time.  All questions have been addressed and answered.  Patient was instructed to, and agrees to follow-up with their primary care physician as well as return to the emergency department if any new or worsening symptoms develop.   Charleen Conn, MD 07/01/23 0930

## 2023-07-01 NOTE — ED Triage Notes (Signed)
 Arrived by EMS.  EMS states: Patient has been complaining of chest pain and mid lower pain for 5-6 days. Tonight she had radiating pain going across her chest and left elbow.  Fall was on 4/4 and has bruising/knot on left shin. PT is working with her.  Hasn't seen cardiologist in about a year.  Respiratory distress/pneumonia was 13 of Feb to beginning of March.  BP 130/66 96% No cough or distress Uses walker to get to stretch

## 2023-07-01 NOTE — Discharge Instructions (Addendum)
 Please use ibuprofen (Motrin) up to 800 mg every 8 hours, naproxen (Naprosyn) up to 500 mg every 12 hours, and/or acetaminophen (Tylenol) up to 4 g/day for any continued pain.  Please do not use this medication regimen for longer than 7 days

## 2023-07-01 NOTE — ED Provider Notes (Signed)
 [facility] Provider Note    Event Date/Time   First MD Initiated Contact with Patient 07/01/23 0301     (approximate)   History   Chest Pain   HPI  Sarah Norton is a 68 y.o. female brought to the ED via EMS from home with a chief complaint of chest pain for the past 5 to 6 days.  Presents tonight due to pain radiating towards her left chest and to her shoulder. History of COPD on baseline oxygen, hypertension, hyperlipidemia.  History of atrial flutter on Eliquis .  Denies history of CAD.  Denies recent fever/chills, shortness of breath, abdominal pain, nausea, vomiting, palpitations or dizziness.  Marvell Slider on/06/2023 and has residual hematoma to left tibia.     Past Medical History   Past Medical History:  Diagnosis Date   Arthritis    Asthma    Cataract of both eyes    Complication of anesthesia    woke up during surgery x1   COPD (chronic obstructive pulmonary disease) (HCC)    Coronary artery disease    DDD (degenerative disc disease), lumbar    Diabetes mellitus without complication (HCC)    Dyspnea    DUE TO COPD   Hernia of abdominal wall    History of hiatal hernia    small   Hyperlipidemia    Hypertension    Hypothyroidism    hashimotos throiditis  -thyroidectomy  2000   Occasional tremors    Oxygen desaturation    NOW PT ON 3-4 LITERS [place] DAILY     Active Problem List   Patient Active Problem List   Diagnosis Date Noted   Pressure injury of skin 05/12/2023   Atrial fibrillation (HCC) 05/12/2023   Obesity (BMI 30-39.9) 05/12/2023   Acute respiratory failure with hypoxia and hypercarbia (HCC) 04/24/2023   Cellulitis 05/03/2020   COPD (chronic obstructive pulmonary disease) (HCC) 05/03/2020   Polycythemia 05/03/2020   Lymphedema 01/26/2019   Arthritis 12/29/2018   Diabetes mellitus type 2, uncomplicated (HCC) 12/29/2018   Hypertension 12/29/2018   Thyroid disease 12/29/2018   Tremor 12/29/2018   Cellulitis of leg, left  12/29/2018   Swelling of limb 12/29/2018   Mass of wrist 12/24/2017   Medial epicondylitis 12/16/2017   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   Bilateral leg weakness 12/22/2014     Past Surgical History   Past Surgical History:  Procedure Laterality Date   ABDOMINAL HYSTERECTOMY     CHOLECYSTECTOMY     COLONOSCOPY WITH PROPOFOL  N/A 09/08/2014   Procedure: COLONOSCOPY WITH PROPOFOL ;  Surgeon: Marnee Sink, MD;  Location: [facility] ENDOSCOPY;  Service: Endoscopy;  Laterality: N/A;   GANGLION CYST EXCISION Right 08/15/2020   Procedure: Right index finger cyst removal;  Surgeon: Molli Angelucci, MD;  Location: [facility] ORS;  Service: Orthopedics;  Laterality: Right;   HIATAL HERNIA REPAIR  2000   OTHER SURGICAL HISTORY     vocal cord surgery due to injury during thyroid surgery   parathyroidectomy  2000   THYROIDECTOMY     TONSILLECTOMY     TUMOR REMOVAL Right Leg     Home Medications   Prior to Admission medications   Medication Sig Start Date End Date Taking? Authorizing Provider  albuterol  (VENTOLIN  HFA) 108 (90 Base) MCG/ACT inhaler Inhale 2 puffs into the lungs every 4 (four) hours as needed for wheezing or shortness of breath.    [provider]  allopurinol (ZYLOPRIM) 100 MG tablet Take 100 mg by mouth every morning.  [provider]  amLODipine  (NORVASC ) 5 MG tablet Take 5 mg by mouth every morning. 07/23/20   [provider]  atorvastatin  (LIPITOR) 40 MG tablet Take 40 mg by mouth every morning.    [provider]  benazepril (LOTENSIN) 40 MG tablet Take 40 mg by mouth every morning.    [provider]  ELIQUIS  5 MG TABS tablet Take 5 mg by mouth 2 (two) times daily.    [provider]  fluticasone (FLONASE) 50 MCG/ACT nasal spray Place 2 sprays into both nostrils daily as needed for allergies. 03/31/20   [provider]  fluticasone-salmeterol (ADVAIR  HFA) 115-21 MCG/ACT inhaler Inhale 2 puffs by mouth twice daily 07/07/21    Kasa, Kurian, MD  furosemide  (LASIX ) 80 MG tablet Take 80 mg by mouth every morning.    [provider]  gabapentin (NEURONTIN) 600 MG tablet Take 600 mg by mouth 3 (three) times daily.    [provider]  levocetirizine (XYZAL) 5 MG tablet Take 5 mg by mouth every evening.    [provider]  levothyroxine  (SYNTHROID ) 175 MCG tablet Take 175 mcg by mouth daily before breakfast.    [provider]  methocarbamol (ROBAXIN) 500 MG tablet Take 1-2 tablets by mouth every 6 (six) hours as needed for muscle spasms. 07/18/20   [provider]  Metoprolol  Tartrate 75 MG TABS Take 1 tablet by mouth 2 (two) times daily.    [provider]  montelukast  (SINGULAIR ) 10 MG tablet Take 10 mg by mouth every morning.    [provider]  MOUNJARO 10 MG/0.5ML Pen Inject 10 mg into the skin once a week. 03/27/23   [provider]  [facility] powder Apply 1 Application topically 2 (two) times daily.    [provider]  OXYGEN Inhale 3-4 L into the lungs daily.    [provider]  promethazine (PHENERGAN) 25 MG tablet Take 25 mg by mouth every 6 (six) hours as needed for nausea or vomiting.    [provider]  Tiotropium Bromide  Monohydrate (SPIRIVA  RESPIMAT) 1.25 MCG/ACT AERS Inhale 2 puffs into the lungs daily for 30 doses. 10/03/20 04/24/23  Kasa, Kurian, MD  tiZANidine (ZANAFLEX) 4 MG tablet Take 4 mg by mouth 3 (three) times daily.    [provider]  tretinoin  (RETIN-A ) 0.05 % cream Apply to face qhs, wash off qam 06/16/23   Elta Halter, MD     Allergies  Patient has no known allergies.   Family History   Family History  Problem Relation Age of Onset   Breast cancer Sister    Kidney disease Mother      Physical Exam  Triage Vital Signs: ED Triage Vitals  Encounter Vitals Group     BP 07/01/23 0255 110/62     Systolic BP Percentile --      Diastolic BP Percentile --      Pulse Rate 07/01/23  0255 (!) 56     Resp 07/01/23 0255 14     Temp 07/01/23 0255 98.5 F (36.9 C)     Temp Source 07/01/23 0255 Oral     SpO2 07/01/23 0255 100 %     Weight 07/01/23 0257 237 lb 7 oz (107.7 kg)     Height 07/01/23 0257 5\' 8"  (1.727 m)     Head Circumference --      Peak Flow --      Pain Score 07/01/23 0249 7     Pain Loc --  Pain Education --      Exclude from Growth Chart --     Updated Vital Signs: BP 114/62 (BP Location: Left Arm)   Pulse (!) 53   Temp 98.5 F (36.9 C) (Oral)   Resp 12   Ht 5\' 8"  (1.727 m)   Wt 107.7 kg   SpO2 100%   BMI 36.10 kg/m    General: Awake, no distress.  CV:  Regular rate, irregular rhythm.  Good peripheral perfusion.  Resp:  Normal effort.  CTAB.  Reproducible pain on palpation to left anterior chest and ranging left shoulder. Abd:  Nontender.  No distention.  Other:  Bilateral calves are supple and nontender.   ED Results / Procedures / Treatments  Labs (all labs ordered are listed, but only abnormal results are displayed) Labs Reviewed  CBC WITH DIFFERENTIAL/PLATELET - Abnormal; Notable for the following components:      Result Value   RBC 3.54 (*)    Hemoglobin 11.1 (*)    HCT 33.1 (*)    All other components within normal limits  COMPREHENSIVE METABOLIC PANEL WITH GFR - Abnormal; Notable for the following components:   Glucose, Bld 152 (*)    BUN 40 (*)    Creatinine, Ser 1.70 (*)    Calcium  10.4 (*)    GFR, Estimated 32 (*)    All other components within normal limits  LIPASE, BLOOD - Abnormal; Notable for the following components:   Lipase 69 (*)    All other components within normal limits  TROPONIN I (HIGH SENSITIVITY)  TROPONIN I (HIGH SENSITIVITY)     EKG  ED ECG REPORT I, Arasely Akkerman J, the attending physician, personally viewed and interpreted this ECG.   Date: 07/01/2023  EKG Time: 0358  Rate: 59  Rhythm: Atrial flutter  Axis: Normal  Intervals:none  ST&T Change: Nonspecific    RADIOLOGY I have  independently visualized and interpreted patient's imaging study as well as noted the radiology interpretation:  Wet read: ? RLL opacity, awaiting formal radiology interpretation  Official radiology report(s): No results found.   PROCEDURES:  Critical Care performed: No  .1-3 Lead EKG Interpretation  Performed by: Norlene Beavers, MD Authorized by: Norlene Beavers, MD     Interpretation: abnormal     ECG rate:  60   ECG rate assessment: normal     Rhythm: atrial flutter     Ectopy: none     Conduction: normal   Comments:     Patient placed on cardiac monitor to evaluate for arrhythmias    MEDICATIONS ORDERED IN ED: Medications  morphine  (PF) 4 MG/ML injection 4 mg (4 mg Intravenous Given 07/01/23 0347)  ondansetron  (ZOFRAN ) injection 4 mg (4 mg Intravenous Given 07/01/23 0346)  sodium chloride  0.9 % bolus 1,000 mL (1,000 mLs Intravenous New Bag/Given 07/01/23 0545)     IMPRESSION / MDM / ASSESSMENT AND PLAN / ED COURSE  I reviewed the triage vital signs and the nursing notes.                             68 year old female presenting with chest pain. Differential diagnosis includes, but is not limited to, ACS, aortic dissection, pulmonary embolism, cardiac tamponade, pneumothorax, pneumonia, pericarditis, myocarditis, GI-related causes including esophagitis/gastritis, and musculoskeletal chest wall pain.   I personally reviewed patient's records and note her hospitalization from 04/24/2023 for COPD exacerbation and CAP.  Patient's presentation is most consistent with acute complicated illness /  injury requiring diagnostic workup.  The patient is on the cardiac monitor to evaluate for evidence of arrhythmia and/or significant heart rate changes.  Will obtain cardiac panel, chest x-ray.  Administer morphine  for chest pain and reassess.  Clinical Course as of 07/01/23 0655  Tue Jul 01, 2023  0635 Pain improved.  With laboratory results demonstrated AKI.  IV fluids infusing.  Plan  to obtain repeat troponin.  If negative, anticipate discharge home with as needed pain medicines. [JS]  [id number] Care be transferred to the oncoming provider at change of shift pending chest x-ray interpretation and repeat troponin. [JS]    Clinical Course User Index [JS] Norlene Beavers, MD     FINAL CLINICAL IMPRESSION(S) / ED DIAGNOSES   Final diagnoses:  Chest pain, unspecified type  AKI (acute kidney injury) (HCC)     Rx / DC Orders   ED Discharge Orders     None        Note:  This document was prepared using Dragon voice recognition software and may include unintentional dictation errors.   Jatin Naumann J, MD 07/01/23 [phone]

## 2023-07-01 NOTE — ED Notes (Signed)
 Pt brought a bedside commode, pt states that she needed to void

## 2023-07-08 ENCOUNTER — Ambulatory Visit
Admission: RE | Admit: 2023-07-08 | Discharge: 2023-07-08 | Disposition: A | Discharge status: Home / Self Care | Source: Ambulatory Visit | Attending: Family Medicine | Admitting: Family Medicine

## 2023-07-08 ENCOUNTER — Other Ambulatory Visit: Payer: Self-pay | Admitting: Family Medicine

## 2023-07-08 DIAGNOSIS — R0789 Other chest pain: Secondary | ICD-10-CM

## 2023-07-10 ENCOUNTER — Other Ambulatory Visit: Payer: Self-pay | Admitting: Nurse Practitioner

## 2023-07-10 DIAGNOSIS — R079 Chest pain, unspecified: Secondary | ICD-10-CM

## 2023-07-10 DIAGNOSIS — I428 Other cardiomyopathies: Secondary | ICD-10-CM

## 2023-07-10 DIAGNOSIS — I4892 Unspecified atrial flutter: Secondary | ICD-10-CM

## 2023-07-10 DIAGNOSIS — I10 Essential (primary) hypertension: Secondary | ICD-10-CM

## 2023-07-10 DIAGNOSIS — E782 Mixed hyperlipidemia: Secondary | ICD-10-CM

## 2023-07-10 DIAGNOSIS — Z87891 Personal history of nicotine dependence: Secondary | ICD-10-CM

## 2023-07-10 DIAGNOSIS — E119 Type 2 diabetes mellitus without complications: Secondary | ICD-10-CM

## 2023-07-30 ENCOUNTER — Telehealth (HOSPITAL_COMMUNITY): Payer: Self-pay | Admitting: *Deleted

## 2023-07-30 NOTE — Telephone Encounter (Signed)
 Attempted to call patient regarding upcoming cardiac CT appointment. Left message on voicemail with name and callback number  Larey Brick RN Navigator Cardiac Imaging [facility] [phone] Office [phone] Cell

## 2023-07-31 ENCOUNTER — Ambulatory Visit: Admission: RE | Admit: 2023-07-31 | Source: Ambulatory Visit

## 2023-08-05 ENCOUNTER — Other Ambulatory Visit: Payer: Self-pay | Admitting: Nurse Practitioner

## 2023-08-05 DIAGNOSIS — E119 Type 2 diabetes mellitus without complications: Secondary | ICD-10-CM

## 2023-08-05 DIAGNOSIS — I10 Essential (primary) hypertension: Secondary | ICD-10-CM

## 2023-08-05 DIAGNOSIS — Z87891 Personal history of nicotine dependence: Secondary | ICD-10-CM

## 2023-08-05 DIAGNOSIS — I428 Other cardiomyopathies: Secondary | ICD-10-CM

## 2023-08-05 DIAGNOSIS — J449 Chronic obstructive pulmonary disease, unspecified: Secondary | ICD-10-CM

## 2023-08-05 DIAGNOSIS — R079 Chest pain, unspecified: Secondary | ICD-10-CM

## 2023-08-05 DIAGNOSIS — I4892 Unspecified atrial flutter: Secondary | ICD-10-CM

## 2023-08-26 ENCOUNTER — Encounter
Admission: RE | Admit: 2023-08-26 | Discharge: 2023-08-26 | Disposition: A | Discharge status: Home / Self Care | Source: Ambulatory Visit | Attending: Nurse Practitioner | Admitting: Nurse Practitioner

## 2023-08-26 DIAGNOSIS — I10 Essential (primary) hypertension: Secondary | ICD-10-CM | POA: Diagnosis present

## 2023-08-26 DIAGNOSIS — I428 Other cardiomyopathies: Secondary | ICD-10-CM | POA: Diagnosis present

## 2023-08-26 DIAGNOSIS — J449 Chronic obstructive pulmonary disease, unspecified: Secondary | ICD-10-CM | POA: Diagnosis present

## 2023-08-26 DIAGNOSIS — Z87891 Personal history of nicotine dependence: Secondary | ICD-10-CM | POA: Diagnosis present

## 2023-08-26 DIAGNOSIS — R079 Chest pain, unspecified: Secondary | ICD-10-CM | POA: Insufficient documentation

## 2023-08-26 DIAGNOSIS — E119 Type 2 diabetes mellitus without complications: Secondary | ICD-10-CM | POA: Insufficient documentation

## 2023-08-26 DIAGNOSIS — I4892 Unspecified atrial flutter: Secondary | ICD-10-CM | POA: Insufficient documentation

## 2023-08-26 MED ORDER — TECHNETIUM TC 99M TETROFOSMIN IV KIT
10.3500 | PACK | Freq: Once | INTRAVENOUS | Status: AC | PRN
  Administered 2023-08-26: 10.35 via INTRAVENOUS

## 2023-08-26 MED ORDER — TECHNETIUM TC 99M TETROFOSMIN IV KIT
30.7600 | PACK | Freq: Once | INTRAVENOUS | Status: AC | PRN
  Administered 2023-08-26: 30.76 via INTRAVENOUS

## 2023-08-26 MED ORDER — REGADENOSON 0.4 MG/5ML IV SOLN
0.4000 mg | Freq: Once | INTRAVENOUS | Status: AC
  Administered 2023-08-26: 0.4 mg via INTRAVENOUS

## 2023-08-27 LAB — NM MYOCAR MULTI W/SPECT W/WALL MOTION / EF
Estimated workload: 1
Exercise duration (min): 1 min
Exercise duration (sec): 0 s
MPHR: 151 {beats}/min
Nuc Stress EF: 54 %
Peak HR: 94 {beats}/min
Percent HR: 62 %
Rest HR: 88 {beats}/min
Rest Nuclear Isotope Dose: 10.4 mCi
SDS: 6
SRS: 0
SSS: 10
ST Depression (mm): 0 mm
Stress Nuclear Isotope Dose: 30.8 mCi
TID: 1.09
